# Patient Record
Sex: Female | Born: 1955 | Race: White | Hispanic: No | Marital: Single | Smoking: Never smoker
Health system: Southern US, Community
[De-identification: ages and names within clinical notes are randomized; demographics above are authoritative.]

## PROBLEM LIST (undated history)

## (undated) DIAGNOSIS — T7840XA Allergy, unspecified, initial encounter: Secondary | ICD-10-CM

## (undated) DIAGNOSIS — M545 Low back pain, unspecified: Secondary | ICD-10-CM

## (undated) DIAGNOSIS — E785 Hyperlipidemia, unspecified: Secondary | ICD-10-CM

## (undated) DIAGNOSIS — L570 Actinic keratosis: Secondary | ICD-10-CM

## (undated) DIAGNOSIS — G709 Myoneural disorder, unspecified: Secondary | ICD-10-CM

## (undated) DIAGNOSIS — R200 Anesthesia of skin: Secondary | ICD-10-CM

## (undated) DIAGNOSIS — E559 Vitamin D deficiency, unspecified: Secondary | ICD-10-CM

## (undated) DIAGNOSIS — M1990 Unspecified osteoarthritis, unspecified site: Secondary | ICD-10-CM

## (undated) DIAGNOSIS — E119 Type 2 diabetes mellitus without complications: Secondary | ICD-10-CM

## (undated) DIAGNOSIS — H269 Unspecified cataract: Secondary | ICD-10-CM

## (undated) DIAGNOSIS — I10 Essential (primary) hypertension: Secondary | ICD-10-CM

## (undated) HISTORY — DX: Allergy, unspecified, initial encounter: T78.40XA

## (undated) HISTORY — DX: Myoneural disorder, unspecified: G70.9

## (undated) HISTORY — DX: Hyperlipidemia, unspecified: E78.5

## (undated) HISTORY — DX: Vitamin D deficiency, unspecified: E55.9

## (undated) HISTORY — DX: Low back pain, unspecified: M54.50

## (undated) HISTORY — DX: Low back pain: M54.5

## (undated) HISTORY — PX: OOPHORECTOMY: SHX86

## (undated) HISTORY — DX: Essential (primary) hypertension: I10

## (undated) HISTORY — DX: Actinic keratosis: L57.0

## (undated) HISTORY — DX: Type 2 diabetes mellitus without complications: E11.9

## (undated) HISTORY — DX: Unspecified osteoarthritis, unspecified site: M19.90

## (undated) HISTORY — DX: Anesthesia of skin: R20.0

## (undated) HISTORY — DX: Unspecified cataract: H26.9

## (undated) HISTORY — PX: DILATION AND CURETTAGE OF UTERUS: SHX78

---
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
## 2004-02-26 ENCOUNTER — Ambulatory Visit: Payer: Self-pay

## 2004-11-20 ENCOUNTER — Ambulatory Visit: Payer: Self-pay

## 2005-11-26 ENCOUNTER — Ambulatory Visit: Payer: Self-pay

## 2006-05-26 ENCOUNTER — Ambulatory Visit: Payer: Self-pay

## 2006-06-03 ENCOUNTER — Ambulatory Visit: Payer: Self-pay

## 2006-11-28 ENCOUNTER — Emergency Department: Payer: Self-pay | Admitting: Emergency Medicine

## 2007-02-02 ENCOUNTER — Ambulatory Visit: Payer: Self-pay

## 2007-02-16 ENCOUNTER — Ambulatory Visit: Payer: Self-pay | Admitting: Family Medicine

## 2007-06-13 ENCOUNTER — Ambulatory Visit: Payer: Self-pay | Admitting: Family Medicine

## 2008-03-09 HISTORY — PX: ABDOMINAL HYSTERECTOMY: SHX81

## 2008-04-17 ENCOUNTER — Ambulatory Visit: Payer: Self-pay

## 2009-01-22 ENCOUNTER — Ambulatory Visit: Payer: Self-pay

## 2009-01-29 ENCOUNTER — Inpatient Hospital Stay: Payer: Self-pay

## 2009-04-23 ENCOUNTER — Ambulatory Visit: Payer: Self-pay

## 2009-05-24 ENCOUNTER — Ambulatory Visit: Payer: Self-pay

## 2010-02-17 LAB — HM DEXA SCAN: HM Dexa Scan: NORMAL

## 2010-03-09 DIAGNOSIS — E119 Type 2 diabetes mellitus without complications: Secondary | ICD-10-CM

## 2010-03-09 HISTORY — DX: Type 2 diabetes mellitus without complications: E11.9

## 2010-05-13 ENCOUNTER — Ambulatory Visit: Payer: Self-pay | Admitting: Family Medicine

## 2010-06-17 ENCOUNTER — Ambulatory Visit: Payer: Self-pay

## 2010-07-04 ENCOUNTER — Ambulatory Visit: Payer: Self-pay

## 2011-07-07 ENCOUNTER — Ambulatory Visit: Payer: Self-pay

## 2012-07-26 ENCOUNTER — Ambulatory Visit: Payer: Self-pay

## 2012-07-26 LAB — HM MAMMOGRAPHY: HM Mammogram: NORMAL

## 2012-12-07 LAB — HM PAP SMEAR: HM Pap smear: NORMAL

## 2013-04-28 ENCOUNTER — Encounter: Payer: Self-pay | Admitting: General Surgery

## 2013-05-15 ENCOUNTER — Ambulatory Visit (INDEPENDENT_AMBULATORY_CARE_PROVIDER_SITE_OTHER): Payer: BC Managed Care – PPO | Admitting: General Surgery

## 2013-05-15 ENCOUNTER — Encounter: Payer: Self-pay | Admitting: General Surgery

## 2013-05-15 ENCOUNTER — Other Ambulatory Visit: Payer: Self-pay | Admitting: General Surgery

## 2013-05-15 VITALS — BP 142/76 | HR 76 | Resp 13 | Ht 68.0 in | Wt 237.0 lb

## 2013-05-15 DIAGNOSIS — Z1211 Encounter for screening for malignant neoplasm of colon: Secondary | ICD-10-CM | POA: Insufficient documentation

## 2013-05-15 MED ORDER — POLYETHYLENE GLYCOL 3350 17 GM/SCOOP PO POWD: ORAL | Status: DC

## 2013-05-15 NOTE — Progress Notes (Signed)
Patient ID: Emily Pitts, female   DOB: [birth year], 58 y.o.   MRN: [id number]  Chief Complaint  Patient presents with  . Other    Positive hemocult    HPI Emily Pitts is a 58 y.o. female here today for colonoscopy consult. She had a positive hemoccult during her last physical. She has never had a colonoscopy. She states that she does sometimes have some nausea for the past year in the mornings that goes away with eating. This is independent of previous meals. She denies any problems with using the bathroom. She does state that since the positive hemoccult that she has noticed some redness in her stools.she states that the color is dark red, but that it is minimal.   The patient denies any pain with defecation. No history of blood mixed in with the stool.  The patient reports being identified with elevated blood sugars 3 years ago. Etter Sjogren, M.D. Is her PCP. The patient reports that her last hemoglobin A1c was improved to the point where the patient was instructed to discontinue her metformin therapy.  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 HPI  Past Medical History  Diagnosis Date  . Hypertension   . Hyperlipidemia   . Allergy   . Diabetes mellitus without complication 0932    Past Surgical History  Procedure Laterality Date  . Appendectomy  1966  . Abdominal hysterectomy  2010    No family history on file.  Social History History  Substance Use Topics  . Smoking status: Never Smoker   . Smokeless tobacco: Never Used  . Alcohol Use: No    No Known Allergies  Current Outpatient Prescriptions  Medication Sig Dispense Refill  . aspirin 81 MG tablet Take 81 mg by mouth daily.      . cholecalciferol (VITAMIN D) 400 UNITS TABS tablet Take 400 Units by mouth.      . fluticasone (FLONASE) 50 MCG/ACT nasal spray Place 1 spray into both nostrils as needed.       Marland Kitchen levocetirizine (XYZAL) 5 MG tablet Take 5 mg by mouth daily.        . magnesium oxide (MAG-OX) 400 MG tablet Take 400 mg by mouth daily.      . metFORMIN (GLUCOPHAGE) 500 MG tablet Take 500 mg by mouth daily with breakfast.       . Multiple Vitamins-Minerals (MULTIVITAMIN WITH MINERALS) tablet Take 1 tablet by mouth daily.      . simvastatin (ZOCOR) 40 MG tablet Take 40 mg by mouth daily.       Jabier Gauss 20-5-12.5 MG TABS Take 1 tablet by mouth daily.       . polyethylene glycol powder (GLYCOLAX/MIRALAX) powder 255 grams one bottle for colonoscopy prep  255 g  0   No current facility-administered medications for this visit.    Review of Systems Review of Systems  Constitutional: Negative.   Respiratory: Negative.   Cardiovascular: Negative.   Gastrointestinal: Positive for nausea and blood in stool. Negative for vomiting, abdominal pain, diarrhea, constipation, abdominal distention, anal bleeding and rectal pain.    Blood pressure 142/76, pulse 76, resp. rate 13, height 5\' 8"  (1.727 m), weight 237 lb (107.502 kg).  Physical Exam Physical Exam  Constitutional: She is oriented to person, place, and time. She appears well-developed and well-nourished.  Neck: Neck supple.  Cardiovascular: Normal rate, regular rhythm and normal heart sounds.   Pulmonary/Chest: Effort normal and breath sounds normal.  Abdominal:  Soft. Normal appearance and bowel sounds are normal. There is no hepatosplenomegaly. There is no tenderness.  Lymphadenopathy:    She has no cervical adenopathy.       Right: No inguinal adenopathy present.       Left: No inguinal adenopathy present.  Neurological: She is alert and oriented to person, place, and time.    Data Reviewed Office notes from Dr. Laurey Morale dated April 28, 2013 are notable for a positive stool Hemoccult.  Assessment    Her candidate for screening colonoscopy.     Plan    The procedure was reviewed in detail. The risks associated with colonoscopy including those of bleeding and perforation were  reviewed.  Her upper GI symptoms are not suggestive of ulcer/gastritis, an upper endoscopy would not be recommended at this time.      Patient has been scheduled for a colonoscopy on 06-07-13 at [facility]. Patient has been asked to hold metformin day of colonoscopy prep and procedure. It is okay for patient to continue 81 mg aspirin.   Robert Bellow 05/15/2013, 9:07 PM

## 2013-05-15 NOTE — Patient Instructions (Addendum)
Colonoscopy A colonoscopy is an exam to look at the entire large intestine (colon). This exam can help find problems such as tumors, polyps, inflammation, and areas of bleeding. The exam takes about 1 hour.  LET [facility] CARE PROVIDER KNOW ABOUT:   Any allergies you have.  All medicines you are taking, including vitamins, herbs, eye drops, creams, and over-the-counter medicines.  Previous problems you or members of your family have had with the use of anesthetics.  Any blood disorders you have.  Previous surgeries you have had.  Medical conditions you have. RISKS AND COMPLICATIONS  Generally, this is a safe procedure. However, as with any procedure, complications can occur. Possible complications include:  Bleeding.  Tearing or rupture of the colon wall.  Reaction to medicines given during the exam.  Infection (rare). BEFORE THE PROCEDURE   Ask your health care provider about changing or stopping your regular medicines.  You may be prescribed an oral bowel prep. This involves drinking a large amount of medicated liquid, starting the day before your procedure. The liquid will cause you to have multiple loose stools until your stool is almost clear or light green. This cleans out your colon in preparation for the procedure.  Do not eat or drink anything else once you have started the bowel prep, unless your health care provider tells you it is safe to do so.  Arrange for someone to drive you home after the procedure. PROCEDURE   You will be given medicine to help you relax (sedative).  You will lie on your side with your knees bent.  A long, flexible tube with a light and camera on the end (colonoscope) will be inserted through the rectum and into the colon. The camera sends video back to a computer screen as it moves through the colon. The colonoscope also releases carbon dioxide gas to inflate the colon. This helps your health care provider see the area better.  During  the exam, your health care provider may take a small tissue sample (biopsy) to be examined under a microscope if any abnormalities are found.  The exam is finished when the entire colon has been viewed. AFTER THE PROCEDURE   Do not drive for 24 hours after the exam.  You may have a small amount of blood in your stool.  You may pass moderate amounts of gas and have mild abdominal cramping or bloating. This is caused by the gas used to inflate your colon during the exam.  Ask when your test results will be ready and how you will get your results. Make sure you get your test results. Document Released: 02/21/2000 Document Revised: 12/14/2012 Document Reviewed: 10/31/2012 [facility] Patient Information 2014 [place].  Patient has been scheduled for a colonoscopy on 06-07-13 at [facility]. Patient has been asked to hold metformin day of colonoscopy prep and procedure. It is okay for patient to continue 81 mg aspirin.

## 2013-06-07 ENCOUNTER — Ambulatory Visit: Payer: Self-pay | Admitting: General Surgery

## 2013-06-07 DIAGNOSIS — Z1211 Encounter for screening for malignant neoplasm of colon: Secondary | ICD-10-CM

## 2013-06-07 HISTORY — PX: COLONOSCOPY: SHX174

## 2013-06-07 LAB — HM COLONOSCOPY: HM Colonoscopy: NORMAL

## 2013-06-08 LAB — PATHOLOGY REPORT

## 2013-06-12 ENCOUNTER — Telehealth: Payer: Self-pay | Admitting: *Deleted

## 2013-06-12 ENCOUNTER — Encounter: Payer: Self-pay | Admitting: General Surgery

## 2013-06-12 NOTE — Telephone Encounter (Signed)
Patient notified as instructed and she verbalizes understanding. This patient will be placed in the recalls for 10 year follow up.

## 2013-06-12 NOTE — Telephone Encounter (Signed)
Message copied by Dominga Ferry on Mon Jun 12, 2013  2:14 PM ------      Message from: [place], [place] W      Created: Mon Jun 12, 2013  9:05 AM       Reason notify the patient at the biopsy completed at the time of her colon exam was normal. She should plan on repeat exam in 10 years, earlier if any symptoms develop. ------

## 2013-07-06 ENCOUNTER — Encounter: Payer: Self-pay | Admitting: General Surgery

## 2013-08-09 ENCOUNTER — Ambulatory Visit: Payer: Self-pay | Admitting: Family Medicine

## 2014-01-08 ENCOUNTER — Encounter: Payer: Self-pay | Admitting: General Surgery

## 2014-04-20 LAB — LIPID PANEL
Cholesterol: 145 mg/dL (ref 0–200)
HDL: 56 mg/dL (ref 35–70)
LDL Cholesterol: 64 mg/dL
TRIGLYCERIDES: 124 mg/dL (ref 40–160)

## 2014-04-20 LAB — HEMOGLOBIN A1C: Hgb A1c MFr Bld: 6.5 % — AB (ref 4.0–6.0)

## 2014-09-20 LAB — HM MAMMOGRAPHY: HM Mammogram: NORMAL

## 2014-10-08 ENCOUNTER — Encounter: Payer: Self-pay | Admitting: Family Medicine

## 2014-10-08 ENCOUNTER — Other Ambulatory Visit: Payer: Self-pay | Admitting: Family Medicine

## 2014-10-08 ENCOUNTER — Ambulatory Visit (INDEPENDENT_AMBULATORY_CARE_PROVIDER_SITE_OTHER): Payer: BC Managed Care – PPO | Admitting: Family Medicine

## 2014-10-08 VITALS — BP 118/60 | HR 107 | Temp 99.2°F | Resp 18 | Wt 234.8 lb

## 2014-10-08 DIAGNOSIS — E1129 Type 2 diabetes mellitus with other diabetic kidney complication: Secondary | ICD-10-CM | POA: Insufficient documentation

## 2014-10-08 DIAGNOSIS — R94131 Abnormal electromyogram [EMG]: Secondary | ICD-10-CM | POA: Insufficient documentation

## 2014-10-08 DIAGNOSIS — I10 Essential (primary) hypertension: Secondary | ICD-10-CM | POA: Insufficient documentation

## 2014-10-08 DIAGNOSIS — R9431 Abnormal electrocardiogram [ECG] [EKG]: Secondary | ICD-10-CM | POA: Insufficient documentation

## 2014-10-08 DIAGNOSIS — K219 Gastro-esophageal reflux disease without esophagitis: Secondary | ICD-10-CM | POA: Insufficient documentation

## 2014-10-08 DIAGNOSIS — M545 Low back pain, unspecified: Secondary | ICD-10-CM | POA: Insufficient documentation

## 2014-10-08 DIAGNOSIS — E785 Hyperlipidemia, unspecified: Secondary | ICD-10-CM | POA: Insufficient documentation

## 2014-10-08 DIAGNOSIS — R809 Proteinuria, unspecified: Secondary | ICD-10-CM | POA: Insufficient documentation

## 2014-10-08 DIAGNOSIS — N390 Urinary tract infection, site not specified: Secondary | ICD-10-CM

## 2014-10-08 DIAGNOSIS — E8940 Asymptomatic postprocedural ovarian failure: Secondary | ICD-10-CM | POA: Insufficient documentation

## 2014-10-08 DIAGNOSIS — R209 Unspecified disturbances of skin sensation: Secondary | ICD-10-CM | POA: Insufficient documentation

## 2014-10-08 DIAGNOSIS — E669 Obesity, unspecified: Secondary | ICD-10-CM | POA: Insufficient documentation

## 2014-10-08 DIAGNOSIS — J3089 Other allergic rhinitis: Secondary | ICD-10-CM

## 2014-10-08 DIAGNOSIS — E559 Vitamin D deficiency, unspecified: Secondary | ICD-10-CM | POA: Insufficient documentation

## 2014-10-08 DIAGNOSIS — M722 Plantar fascial fibromatosis: Secondary | ICD-10-CM | POA: Insufficient documentation

## 2014-10-08 DIAGNOSIS — J302 Other seasonal allergic rhinitis: Secondary | ICD-10-CM | POA: Insufficient documentation

## 2014-10-08 DIAGNOSIS — N951 Menopausal and female climacteric states: Secondary | ICD-10-CM | POA: Insufficient documentation

## 2014-10-08 DIAGNOSIS — E1140 Type 2 diabetes mellitus with diabetic neuropathy, unspecified: Secondary | ICD-10-CM | POA: Insufficient documentation

## 2014-10-08 DIAGNOSIS — F439 Reaction to severe stress, unspecified: Secondary | ICD-10-CM | POA: Insufficient documentation

## 2014-10-08 LAB — POCT URINALYSIS DIPSTICK
BILIRUBIN UA: NEGATIVE
Glucose, UA: NEGATIVE
Ketones, UA: NEGATIVE
NITRITE UA: NEGATIVE
RBC UA: NEGATIVE
UROBILINOGEN UA: 1
pH, UA: 6

## 2014-10-08 MED ORDER — CIPROFLOXACIN HCL 500 MG PO TABS: 500.0000 mg | ORAL_TABLET | Freq: Two times a day (BID) | ORAL | Status: DC

## 2014-10-08 NOTE — Patient Instructions (Signed)

## 2014-10-08 NOTE — Addendum Note (Signed)
Addended by: Bobetta Lime on: 10/08/2014 03:13 PM   Modules accepted: Miquel Dunn

## 2014-10-08 NOTE — Progress Notes (Addendum)
Name: Emily Pitts   MRN: [id number]    DOB: [birth year]   Date:10/08/2014       Progress Note  Subjective  Chief Complaint  Chief Complaint  Patient presents with  . Urinary Tract Infection    patient presents with UTI symptoms since Friday.    HPI  Patient is here today with concerns regarding the following symptoms burning with urination, hematuria, hesitancy, incomplete bladder emptying and nausea that started 4 days ago.  Associated with fevers, chills, sweats, fatigue and malaise. Some low back pain but no overt flank pain. Patient states that she has tried OTC Advil, but has not gotten any relief.   Past Medical History  Diagnosis Date  . Hypertension   . Hyperlipidemia   . Allergy   . Diabetes mellitus without complication 2111    History  Substance Use Topics  . Smoking status: Never Smoker   . Smokeless tobacco: Never Used  . Alcohol Use: No     Current outpatient prescriptions:  .  aspirin 81 MG tablet, Take 81 mg by mouth daily., Disp: , Rfl:  .  atorvastatin (LIPITOR) 40 MG tablet, Take 1 tablet by mouth at bedtime., Disp: , Rfl:  .  cholecalciferol (VITAMIN D) 400 UNITS TABS tablet, Take 400 Units by mouth., Disp: , Rfl:  .  ciprofloxacin (CIPRO) 500 MG tablet, Take 1 tablet (500 mg total) by mouth 2 (two) times daily., Disp: 20 tablet, Rfl: 0 .  fluticasone (FLONASE) 50 MCG/ACT nasal spray, Place 1 spray into both nostrils as needed. , Disp: , Rfl:  .  glucose blood test strip, , Disp: , Rfl:  .  levocetirizine (XYZAL) 5 MG tablet, Take 5 mg by mouth daily. , Disp: , Rfl:  .  magnesium oxide (MAG-OX) 400 MG tablet, Take 400 mg by mouth daily., Disp: , Rfl:  .  Multiple Vitamins-Minerals (MULTIVITAMIN WITH MINERALS) tablet, Take 1 tablet by mouth daily., Disp: , Rfl:  .  polyethylene glycol powder (GLYCOLAX/MIRALAX) powder, 255 grams one bottle for colonoscopy prep, Disp: 255 g, Rfl: 0 .  TRIBENZOR 20-5-12.5 MG TABS, Take 1 tablet by mouth daily. , Disp: , Rfl:    No Known Allergies  ROS  10 Systems reviewed and is negative except as mentioned in HPI.   Objective  Filed Vitals:   10/08/14 1449  BP: 118/60  Pulse: 107  Temp: 99.2 F (37.3 C)  TempSrc: Oral  Resp: 18  Weight: 234 lb 12.8 oz (106.505 kg)  SpO2: 98%   Body mass index is 35.71 kg/(m^2).   Recent Results (from the past 2160 hour(s))  POCT urinalysis dipstick     Status: Abnormal   Collection Time: 10/08/14  2:56 PM  Result Value Ref Range   Color, UA ORANGISH RED    Clarity, UA DARK    Glucose, UA NEGATIVE    Bilirubin, UA NEGATIVE    Ketones, UA NEGATIVE    Spec Grav, UA >=1.030    Blood, UA NEGATIVE    pH, UA 6.0    Protein, UA TRACE    Urobilinogen, UA 1.0    Nitrite, UA NEGATIVE    Leukocytes, UA small (1+) (A) Negative    Physical Exam  Constitutional: Patient is obese and well-nourished. In no acute distress but does appear to be uncomfortable from acute illness. Warm to the touch.  Cardiovascular: Normal rate, regular rhythm and normal heart sounds.  No murmur heard.  Pulmonary/Chest: Effort normal and breath sounds normal. No respiratory distress.  Abdomen: Soft with normal bowel sounds, mild tenderness on deep palpation over suprapubic area, no reproducible flank tenderness bilaterally.  Genitourinary: Exam deferred. Skin: Skin is warm and dry. No rash noted. No erythema.  Psychiatric: Patient has a normal mood and affect. Behavior is normal in office today. Judgment and thought content normal in office today.   Assessment & Plan  1. Urinary tract infection without hematuria, site unspecified Symptoms suggestive of complicated urinary tract infection due to complex medical diagnoses such as Diabetes II. Instructed patient on increasing hydration with water and ways to prevent future UTIs. May use Azo for symptomatic relief if not already doing so but not recommended to be used beyond 2-3 days. May start antibiotic therapy.   The patient has been  counseled on the proper use, side effects and potential interactions of the new medication. Patient encouraged to review the side effects and safety profile pamphlet provided with the prescription from the pharmacy as well as request counseling from the pharmacy team as needed.   - POCT urinalysis dipstick - Urine culture - ciprofloxacin (CIPRO) 500 MG tablet; Take 1 tablet (500 mg total) by mouth 2 (two) times daily.  Dispense: 20 tablet; Refill: 0

## 2014-10-09 ENCOUNTER — Telehealth: Payer: Self-pay | Admitting: Family Medicine

## 2014-10-09 NOTE — Telephone Encounter (Signed)
Pt was seen on 10/08/14 and given an antibiotic but is wanting to know if there is something else she can take or do for relief

## 2014-10-10 ENCOUNTER — Inpatient Hospital Stay
Admission: EM | Admit: 2014-10-10 | Discharge: 2014-10-12 | DRG: 872 | Disposition: A | Discharge status: Home / Self Care | Payer: BC Managed Care – PPO | Attending: Internal Medicine | Admitting: Internal Medicine

## 2014-10-10 ENCOUNTER — Inpatient Hospital Stay: Payer: BC Managed Care – PPO

## 2014-10-10 ENCOUNTER — Emergency Department: Payer: BC Managed Care – PPO

## 2014-10-10 ENCOUNTER — Encounter: Payer: Self-pay | Admitting: Emergency Medicine

## 2014-10-10 DIAGNOSIS — E876 Hypokalemia: Secondary | ICD-10-CM | POA: Diagnosis present

## 2014-10-10 DIAGNOSIS — A419 Sepsis, unspecified organism: Secondary | ICD-10-CM | POA: Diagnosis present

## 2014-10-10 DIAGNOSIS — Z79899 Other long term (current) drug therapy: Secondary | ICD-10-CM

## 2014-10-10 DIAGNOSIS — Z1211 Encounter for screening for malignant neoplasm of colon: Secondary | ICD-10-CM

## 2014-10-10 DIAGNOSIS — R17 Unspecified jaundice: Secondary | ICD-10-CM | POA: Diagnosis present

## 2014-10-10 DIAGNOSIS — N12 Tubulo-interstitial nephritis, not specified as acute or chronic: Secondary | ICD-10-CM | POA: Diagnosis present

## 2014-10-10 DIAGNOSIS — Z7982 Long term (current) use of aspirin: Secondary | ICD-10-CM | POA: Diagnosis not present

## 2014-10-10 DIAGNOSIS — Z90710 Acquired absence of both cervix and uterus: Secondary | ICD-10-CM

## 2014-10-10 DIAGNOSIS — I10 Essential (primary) hypertension: Secondary | ICD-10-CM | POA: Diagnosis present

## 2014-10-10 DIAGNOSIS — Z09 Encounter for follow-up examination after completed treatment for conditions other than malignant neoplasm: Secondary | ICD-10-CM

## 2014-10-10 DIAGNOSIS — N179 Acute kidney failure, unspecified: Secondary | ICD-10-CM

## 2014-10-10 DIAGNOSIS — E119 Type 2 diabetes mellitus without complications: Secondary | ICD-10-CM | POA: Diagnosis present

## 2014-10-10 DIAGNOSIS — Z8249 Family history of ischemic heart disease and other diseases of the circulatory system: Secondary | ICD-10-CM | POA: Diagnosis not present

## 2014-10-10 DIAGNOSIS — Z6835 Body mass index (BMI) 35.0-35.9, adult: Secondary | ICD-10-CM | POA: Diagnosis not present

## 2014-10-10 DIAGNOSIS — E785 Hyperlipidemia, unspecified: Secondary | ICD-10-CM | POA: Diagnosis present

## 2014-10-10 DIAGNOSIS — E669 Obesity, unspecified: Secondary | ICD-10-CM | POA: Diagnosis present

## 2014-10-10 DIAGNOSIS — Z8619 Personal history of other infectious and parasitic diseases: Secondary | ICD-10-CM | POA: Diagnosis present

## 2014-10-10 DIAGNOSIS — Z9049 Acquired absence of other specified parts of digestive tract: Secondary | ICD-10-CM | POA: Diagnosis present

## 2014-10-10 DIAGNOSIS — Z833 Family history of diabetes mellitus: Secondary | ICD-10-CM

## 2014-10-10 DIAGNOSIS — R109 Unspecified abdominal pain: Secondary | ICD-10-CM

## 2014-10-10 DIAGNOSIS — N289 Disorder of kidney and ureter, unspecified: Secondary | ICD-10-CM

## 2014-10-10 LAB — COMPREHENSIVE METABOLIC PANEL
ALT: 95 U/L — ABNORMAL HIGH (ref 14–54)
ANION GAP: 15 (ref 5–15)
AST: 97 U/L — ABNORMAL HIGH (ref 15–41)
Albumin: 2.9 g/dL — ABNORMAL LOW (ref 3.5–5.0)
Alkaline Phosphatase: 134 U/L — ABNORMAL HIGH (ref 38–126)
BILIRUBIN TOTAL: 2.7 mg/dL — AB (ref 0.3–1.2)
BUN: 36 mg/dL — AB (ref 6–20)
CALCIUM: 8.3 mg/dL — AB (ref 8.9–10.3)
CHLORIDE: 97 mmol/L — AB (ref 101–111)
CO2: 22 mmol/L (ref 22–32)
Creatinine, Ser: 2.35 mg/dL — ABNORMAL HIGH (ref 0.44–1.00)
GFR calc non Af Amer: 21 mL/min — ABNORMAL LOW (ref >60)
GFR, EST AFRICAN AMERICAN: 25 mL/min — AB (ref >60)
Glucose, Bld: 180 mg/dL — ABNORMAL HIGH (ref 65–99)
Potassium: 3.3 mmol/L — ABNORMAL LOW (ref 3.5–5.1)
Sodium: 134 mmol/L — ABNORMAL LOW (ref 135–145)
Total Protein: 7.1 g/dL (ref 6.5–8.1)

## 2014-10-10 LAB — CBC
HEMATOCRIT: 32.2 % — AB (ref 35.0–47.0)
Hemoglobin: 10.8 g/dL — ABNORMAL LOW (ref 12.0–16.0)
MCH: 28.2 pg (ref 26.0–34.0)
MCHC: 33.5 g/dL (ref 32.0–36.0)
MCV: 84.1 fL (ref 80.0–100.0)
PLATELETS: 181 10*3/uL (ref 150–440)
RBC: 3.83 MIL/uL (ref 3.80–5.20)
RDW: 14.4 % (ref 11.5–14.5)
WBC: 13.5 10*3/uL — AB (ref 3.6–11.0)

## 2014-10-10 LAB — URINALYSIS COMPLETE WITH MICROSCOPIC (ARMC ONLY)
BILIRUBIN URINE: NEGATIVE
GLUCOSE, UA: NEGATIVE mg/dL
Ketones, ur: NEGATIVE mg/dL
Leukocytes, UA: NEGATIVE
Nitrite: NEGATIVE
Protein, ur: 30 mg/dL — AB
Specific Gravity, Urine: 1.013 (ref 1.005–1.030)
pH: 5 (ref 5.0–8.0)

## 2014-10-10 LAB — URINE CULTURE

## 2014-10-10 LAB — LACTIC ACID, PLASMA
Lactic Acid, Venous: 1.6 mmol/L (ref 0.5–2.0)
Lactic Acid, Venous: 1.2 mmol/L (ref 0.5–2.0)

## 2014-10-10 LAB — HEMOGLOBIN A1C: Hgb A1c MFr Bld: 7.4 % — ABNORMAL HIGH (ref 4.0–6.0)

## 2014-10-10 LAB — TSH: TSH: 1.289 u[IU]/mL (ref 0.350–4.500)

## 2014-10-10 MED ORDER — MORPHINE SULFATE 2 MG/ML IJ SOLN: 2.0000 mg | INTRAMUSCULAR | Status: DC | PRN

## 2014-10-10 MED ORDER — HEPARIN SODIUM (PORCINE) 5000 UNIT/ML IJ SOLN
5000.0000 [IU] | Freq: Three times a day (TID) | INTRAMUSCULAR | Status: DC
  Administered 2014-10-10 – 2014-10-12 (×7): 5000 [IU] via SUBCUTANEOUS
  Filled 2014-10-10 (×7): qty 1

## 2014-10-10 MED ORDER — DEXTROSE 5 % IV SOLN
1.0000 g | INTRAVENOUS | Status: DC
  Administered 2014-10-11 – 2014-10-12 (×2): 1 g via INTRAVENOUS
  Filled 2014-10-10 (×3): qty 10

## 2014-10-10 MED ORDER — ASPIRIN 81 MG PO TABS: 81.0000 mg | ORAL_TABLET | Freq: Every day | ORAL | Status: DC

## 2014-10-10 MED ORDER — IRBESARTAN 150 MG PO TABS
300.0000 mg | ORAL_TABLET | Freq: Every day | ORAL | Status: DC
  Administered 2014-10-10 – 2014-10-12 (×3): 300 mg via ORAL
  Filled 2014-10-10 (×3): qty 2

## 2014-10-10 MED ORDER — POTASSIUM CHLORIDE 20 MEQ PO PACK
40.0000 meq | PACK | Freq: Once | ORAL | Status: AC
  Administered 2014-10-10: 40 meq via ORAL
  Filled 2014-10-10: qty 2

## 2014-10-10 MED ORDER — DOCUSATE SODIUM 100 MG PO CAPS
100.0000 mg | ORAL_CAPSULE | Freq: Two times a day (BID) | ORAL | Status: DC
  Administered 2014-10-10 – 2014-10-12 (×3): 100 mg via ORAL
  Filled 2014-10-10 (×3): qty 1

## 2014-10-10 MED ORDER — SODIUM CHLORIDE 0.9 % IV SOLN
INTRAVENOUS | Status: DC
  Administered 2014-10-10 – 2014-10-11 (×4): via INTRAVENOUS

## 2014-10-10 MED ORDER — ACETAMINOPHEN 325 MG PO TABS
650.0000 mg | ORAL_TABLET | Freq: Four times a day (QID) | ORAL | Status: DC | PRN
  Administered 2014-10-10 – 2014-10-12 (×6): 650 mg via ORAL
  Filled 2014-10-10 (×6): qty 2

## 2014-10-10 MED ORDER — OLMESARTAN-AMLODIPINE-HCTZ 40-5-25 MG PO TABS: 1.0000 | ORAL_TABLET | Freq: Every day | ORAL | Status: DC

## 2014-10-10 MED ORDER — ASPIRIN 81 MG PO CHEW
81.0000 mg | CHEWABLE_TABLET | Freq: Every day | ORAL | Status: DC
  Administered 2014-10-10 – 2014-10-12 (×3): 81 mg via ORAL
  Filled 2014-10-10 (×3): qty 1

## 2014-10-10 MED ORDER — ONDANSETRON HCL 4 MG/2ML IJ SOLN
4.0000 mg | Freq: Four times a day (QID) | INTRAMUSCULAR | Status: DC | PRN
  Administered 2014-10-10: 4 mg via INTRAVENOUS
  Filled 2014-10-10: qty 2

## 2014-10-10 MED ORDER — HYDROCHLOROTHIAZIDE 25 MG PO TABS
25.0000 mg | ORAL_TABLET | Freq: Every day | ORAL | Status: DC
  Administered 2014-10-10 – 2014-10-12 (×3): 25 mg via ORAL
  Filled 2014-10-10 (×3): qty 1

## 2014-10-10 MED ORDER — AMLODIPINE BESYLATE 5 MG PO TABS
5.0000 mg | ORAL_TABLET | Freq: Every day | ORAL | Status: DC
  Administered 2014-10-10 – 2014-10-12 (×3): 5 mg via ORAL
  Filled 2014-10-10 (×3): qty 1

## 2014-10-10 MED ORDER — DEXTROSE 5 % IV SOLN
1.0000 g | Freq: Once | INTRAVENOUS | Status: AC
  Administered 2014-10-10: 1 g via INTRAVENOUS
  Filled 2014-10-10: qty 10

## 2014-10-10 MED ORDER — ONDANSETRON HCL 4 MG PO TABS: 4.0000 mg | ORAL_TABLET | Freq: Four times a day (QID) | ORAL | Status: DC | PRN

## 2014-10-10 MED ORDER — MULTI-VITAMIN/MINERALS PO TABS: 1.0000 | ORAL_TABLET | Freq: Every day | ORAL | Status: DC

## 2014-10-10 MED ORDER — VITAMIN C 500 MG PO TABS
500.0000 mg | ORAL_TABLET | Freq: Every day | ORAL | Status: DC
  Administered 2014-10-10 – 2014-10-12 (×2): 500 mg via ORAL
  Filled 2014-10-10 (×2): qty 1

## 2014-10-10 MED ORDER — ADULT MULTIVITAMIN W/MINERALS CH
1.0000 | ORAL_TABLET | Freq: Every day | ORAL | Status: DC
  Administered 2014-10-10 – 2014-10-12 (×2): 1 via ORAL
  Filled 2014-10-10 (×2): qty 1

## 2014-10-10 MED ORDER — LORATADINE 10 MG PO TABS
10.0000 mg | ORAL_TABLET | Freq: Every day | ORAL | Status: DC
  Administered 2014-10-10 – 2014-10-12 (×3): 10 mg via ORAL
  Filled 2014-10-10 (×3): qty 1

## 2014-10-10 MED ORDER — LEVOCETIRIZINE DIHYDROCHLORIDE 5 MG PO TABS: 5.0000 mg | ORAL_TABLET | Freq: Every day | ORAL | Status: DC

## 2014-10-10 MED ORDER — SODIUM CHLORIDE 0.9 % IV BOLUS (SEPSIS)
1000.0000 mL | Freq: Once | INTRAVENOUS | Status: AC
  Administered 2014-10-10: 1000 mL via INTRAVENOUS

## 2014-10-10 MED ORDER — VITAMIN B-12 1000 MCG PO TABS
1000.0000 ug | ORAL_TABLET | Freq: Every day | ORAL | Status: DC
  Administered 2014-10-10 – 2014-10-12 (×2): 1000 ug via ORAL
  Filled 2014-10-10 (×2): qty 1

## 2014-10-10 MED ORDER — ACETAMINOPHEN 650 MG RE SUPP: 650.0000 mg | Freq: Four times a day (QID) | RECTAL | Status: DC | PRN

## 2014-10-10 NOTE — ED Notes (Signed)
Pt uprite on stretcher in exam room with no distress noted; st no urge to void at this time; MD aware

## 2014-10-10 NOTE — Progress Notes (Signed)
[facility] [place] NAME: Marcelline Temkin    MR#:  [id number]  DATE OF BIRTH:  [birth year]  SUBJECTIVE:  CHIEF COMPLAINT:  Patient is feeling weak and tired. Nauseous but no vomiting.  REVIEW OF SYSTEMS:  CONSTITUTIONAL: No fever, reporting fatigue and weakness.  EYES: No blurred or double vision.  EARS, NOSE, AND THROAT: No tinnitus or ear pain.  RESPIRATORY: No cough, shortness of breath, wheezing or hemoptysis.  CARDIOVASCULAR: No chest pain, orthopnea, edema.  GASTROINTESTINAL: No nausea, vomiting, diarrhea or abdominal pain.  GENITOURINARY: No dysuria, hematuria.  ENDOCRINE: No polyuria, nocturia,  HEMATOLOGY: No anemia, easy bruising or bleeding SKIN: No rash or lesion. MUSCULOSKELETAL: No joint pain or arthritis. Reporting back pain   NEUROLOGIC: No tingling, numbness, weakness.  PSYCHIATRY: No anxiety or depression.   DRUG ALLERGIES:  No Known Allergies  VITALS:  Blood pressure 114/63, pulse 82, temperature 98.8 F (37.1 C), temperature source Oral, resp. rate 17, height 5\' 9"  (1.753 m), weight 107.457 kg (236 lb 14.4 oz), SpO2 98 %.  PHYSICAL EXAMINATION:  GENERAL:  59 y.o.-year-old patient lying in the bed with no acute distress.  EYES: Pupils equal, round, reactive to light and accommodation. No scleral icterus. Extraocular muscles intact.  HEENT: Head atraumatic, normocephalic. Oropharynx and nasopharynx clear.  NECK:  Supple, no jugular venous distention. No thyroid enlargement, no tenderness.  LUNGS: Normal breath sounds bilaterally, no wheezing, rales,rhonchi or crepitation. No use of accessory muscles of respiration.  CARDIOVASCULAR: S1, S2 normal. No murmurs, rubs, or gallops.  ABDOMEN: Soft, nontender, nondistended. Bowel sounds present. No organomegaly or mass. No CVA tenderness EXTREMITIES: No pedal edema, cyanosis, or clubbing.  NEUROLOGIC: Cranial nerves II through XII are intact. Muscle strength 5/5 in all  extremities. Sensation intact. Gait not checked.  PSYCHIATRIC: The patient is alert and oriented x 3.  SKIN: No obvious rash, lesion, or ulcer.    LABORATORY PANEL:   CBC  Recent Labs Lab 10/10/14 0351  WBC 13.5*  HGB 10.8*  HCT 32.2*  PLT 181   ------------------------------------------------------------------------------------------------------------------  Chemistries   Recent Labs Lab 10/10/14 0351  NA 134*  K 3.3*  CL 97*  CO2 22  GLUCOSE 180*  BUN 36*  CREATININE 2.35*  CALCIUM 8.3*  AST 97*  ALT 95*  ALKPHOS 134*  BILITOT 2.7*   ------------------------------------------------------------------------------------------------------------------  Cardiac Enzymes No results for input(s): TROPONINI in the last 168 hours. ------------------------------------------------------------------------------------------------------------------  RADIOLOGY:  US Renal  10/10/2014   CLINICAL DATA:  Acute kidney injury  EXAM: RENAL / URINARY TRACT ULTRASOUND COMPLETE  COMPARISON:  CT abdomen pelvis 10/10/2014  FINDINGS: Right Kidney:  Length: 11.7 cm. Echogenicity within normal limits. No mass or hydronephrosis visualized.  Left Kidney:  Length: 12.1 cm. Echogenicity within normal limits. No mass or hydronephrosis visualized.  Bladder:  Urinate bladder not visualized and empty. The patient voided prior to the study.  IMPRESSION: Negative   Electronically Signed   By: Franchot Gallo M.D.   On: 10/10/2014 10:54   Ct Renal Stone Study  10/10/2014   CLINICAL DATA:  Fever and low back pain.  EXAM: CT ABDOMEN AND PELVIS WITHOUT CONTRAST  TECHNIQUE: Multidetector CT imaging of the abdomen and pelvis was performed following the standard protocol without IV contrast.  COMPARISON:  None.  FINDINGS: There is no urinary calculus. There is no hydronephrosis or ureteral dilatation. There are unremarkable unenhanced appearances of the liver, spleen, pancreas, adrenals and kidneys. The abdominal  aorta is normal  in caliber. There is no atherosclerotic calcification. There is no adenopathy in the abdomen or pelvis. There is a small hiatal hernia. The small bowel is unremarkable. There is moderate colonic diverticulosis without evidence of diverticulitis or other acute inflammatory process. There is hysterectomy. No adnexal abnormality is evident.  There is no significant abnormality in the lower chest. There is no significant musculoskeletal lesion. There is moderate degenerative disc disease at L5-S1.  IMPRESSION: 1. Colonic diverticulosis. 2. Small hiatal hernia 3. No acute findings are evident in the abdomen or pelvis.   Electronically Signed   By: Andreas Newport M.D.   On: 10/10/2014 05:08    EKG:  No orders found for this or any previous visit.  ASSESSMENT AND PLAN:    1. Sepsis: The patient meets criteria via leukocytosis and fever.  Follow-up Blood cultures and urine cultures Continue on Rocephin.   2. Pyelonephritis:  Continue to treat the patient with intravenous fluid. Continue IV antibiotics and follow-up on the urine culture and sensitivity   3. Acute kidney injury: Secondary to prerenal and renal  Avoid nephrotoxic agents Continue hydration with IV fluids Renal ultrasound is normal Foley catheter for monitoring intake and output If no improvement will consider nephrology consult . 4. Obesity: BMI is 35.1; encourage healthy diet and exercise 5. DVT provider is: Heparin 6. GI prophylaxis: None    All the records are reviewed and case discussed with Care Management/Social Workerr. Management plans discussed with the patient, family and they are in agreement.  CODE STATUS: Full code  TOTAL TIME TAKING CARE OF THIS PATIENT: Reviewing medical records, labs, follow-up visit, new orders and coordination of care-35 minutes.   POSSIBLE D/C IN 2 DAYS, DEPENDING ON CLINICAL CONDITION.   Nicholes Mango M.D on 10/10/2014 at 2:03 PM  Between 7am to 6pm - Pager -  [phone] After 6pm go to [email or web address] - password [email or web address] [facility]  Office  [phone]  CC: Primary care physician; Loistine Chance, MD

## 2014-10-10 NOTE — ED Notes (Signed)
Pt to room 19 via EMS from home; reports began having fever on Friday; Saturday left lower back pain; seen PCP Monday and dx with UTI; rx Cipro--took 1st ds Monday pm; now with persistent pain, N/V, fever

## 2014-10-10 NOTE — ED Notes (Signed)
Dr. Diamond in to see pt.  

## 2014-10-10 NOTE — ED Notes (Signed)
Pt up to room commode for urine specimen but unable to void at this time; MD notified and st to wait for antibiotic infusion until urine obtained

## 2014-10-10 NOTE — H&P (Signed)
Emily Pitts is an 59 y.o. female.   Chief Complaint: Back pain HPI: The patient presents emergency department complaining of malaise 4 days. She states that she's also developed back pain the last 2 days. She metastases to intermittent fevers. MAXIMUM TEMPERATURE at home 102.4. She admits to nausea but no vomiting. She is also had chills and shortness of breath particularly when riders are worse. After evaluation in the emergency department revealed urinary tract infection with significant leukocytosis. The patient denies any urinary symptoms. CT of abdomen shows inflammation of the kidneys indicative of pyelonephritis which prompted the emergency department staff to call for admission.  Past Medical History  Diagnosis Date  . Hypertension   . Hyperlipidemia   . Allergy   . Diabetes mellitus without complication 5366    Past Surgical History  Procedure Laterality Date  . Appendectomy  1966  . Abdominal hysterectomy  2010    Family History  Problem Relation Age of Onset  . Diabetes Mother   . Diabetes Father   . Hypertension Father   . Diabetes Brother    Social History:  reports that she has never smoked. She has never used smokeless tobacco. She reports that she does not drink alcohol or use illicit drugs.  Allergies: No Known Allergies  Medications Prior to Admission  Medication Sig Dispense Refill  . aspirin 81 MG tablet Take 81 mg by mouth daily.    . ciprofloxacin (CIPRO) 500 MG tablet Take 1 tablet (500 mg total) by mouth 2 (two) times daily. 20 tablet 0  . magnesium oxide (MAG-OX) 400 MG tablet Take 400 mg by mouth daily.    . Multiple Vitamins-Minerals (MULTIVITAMIN WITH MINERALS) tablet Take 1 tablet by mouth daily.    . Olmesartan-Amlodipine-HCTZ (TRIBENZOR) 40-5-25 MG TABS Take 1 tablet by mouth daily.    . vitamin B-12 (CYANOCOBALAMIN) 1000 MCG tablet Take 1,000 mcg by mouth daily.    . vitamin C (ASCORBIC ACID) 500 MG tablet Take 500 mg by mouth daily.    Marland Kitchen  levocetirizine (XYZAL) 5 MG tablet Take 5 mg by mouth daily.     . polyethylene glycol powder (GLYCOLAX/MIRALAX) powder 255 grams one bottle for colonoscopy prep 255 g 0    Results for orders placed or performed during the hospital encounter of 10/10/14 (from the past 48 hour(s))  CBC     Status: Abnormal   Collection Time: 10/10/14  3:51 AM  Result Value Ref Range   WBC 13.5 (H) 3.6 - 11.0 K/uL   RBC 3.83 3.80 - 5.20 MIL/uL   Hemoglobin 10.8 (L) 12.0 - 16.0 g/dL   HCT 32.2 (L) 35.0 - 47.0 %   MCV 84.1 80.0 - 100.0 fL   MCH 28.2 26.0 - 34.0 pg   MCHC 33.5 32.0 - 36.0 g/dL   RDW 14.4 11.5 - 14.5 %   Platelets 181 150 - 440 K/uL  Comprehensive metabolic panel     Status: Abnormal   Collection Time: 10/10/14  3:51 AM  Result Value Ref Range   Sodium 134 (L) 135 - 145 mmol/L   Potassium 3.3 (L) 3.5 - 5.1 mmol/L   Chloride 97 (L) 101 - 111 mmol/L   CO2 22 22 - 32 mmol/L   Glucose, Bld 180 (H) 65 - 99 mg/dL   BUN 36 (H) 6 - 20 mg/dL   Creatinine, Ser 2.35 (H) 0.44 - 1.00 mg/dL   Calcium 8.3 (L) 8.9 - 10.3 mg/dL   Total Protein 7.1 6.5 - 8.1  g/dL   Albumin 2.9 (L) 3.5 - 5.0 g/dL   AST 97 (H) 15 - 41 U/L   ALT 95 (H) 14 - 54 U/L   Alkaline Phosphatase 134 (H) 38 - 126 U/L   Total Bilirubin 2.7 (H) 0.3 - 1.2 mg/dL   GFR calc non Af Amer 21 (L) >60 mL/min   GFR calc Af Amer 25 (L) >60 mL/min    Comment: (NOTE) The eGFR has been calculated using the CKD EPI equation. This calculation has not been validated in all clinical situations. eGFR's persistently <60 mL/min signify possible Chronic Kidney Disease.    Anion gap 15 5 - 15  Lactic acid, plasma     Status: None   Collection Time: 10/10/14  4:09 AM  Result Value Ref Range   Lactic Acid, Venous 1.6 0.5 - 2.0 mmol/L  Urinalysis complete, with microscopic ([facility] only)     Status: Abnormal   Collection Time: 10/10/14  7:16 AM  Result Value Ref Range   Color, Urine AMBER (A) YELLOW   APPearance CLOUDY (A) CLEAR   Glucose, UA  NEGATIVE NEGATIVE mg/dL   Bilirubin Urine NEGATIVE NEGATIVE   Ketones, ur NEGATIVE NEGATIVE mg/dL   Specific Gravity, Urine 1.013 1.005 - 1.030   Hgb urine dipstick 1+ (A) NEGATIVE   pH 5.0 5.0 - 8.0   Protein, ur 30 (A) NEGATIVE mg/dL   Nitrite NEGATIVE NEGATIVE   Leukocytes, UA NEGATIVE NEGATIVE   RBC / HPF 0-5 0 - 5 RBC/hpf   WBC, UA 6-30 0 - 5 WBC/hpf   Bacteria, UA RARE (A) NONE SEEN   Squamous Epithelial / LPF 0-5 (A) NONE SEEN   Mucous PRESENT    Hyaline Casts, UA PRESENT    Granular Casts, UA PRESENT    Amorphous Crystal PRESENT    Ct Renal Stone Study  10/10/2014   CLINICAL DATA:  Fever and low back pain.  EXAM: CT ABDOMEN AND PELVIS WITHOUT CONTRAST  TECHNIQUE: Multidetector CT imaging of the abdomen and pelvis was performed following the standard protocol without IV contrast.  COMPARISON:  None.  FINDINGS: There is no urinary calculus. There is no hydronephrosis or ureteral dilatation. There are unremarkable unenhanced appearances of the liver, spleen, pancreas, adrenals and kidneys. The abdominal aorta is normal in caliber. There is no atherosclerotic calcification. There is no adenopathy in the abdomen or pelvis. There is a small hiatal hernia. The small bowel is unremarkable. There is moderate colonic diverticulosis without evidence of diverticulitis or other acute inflammatory process. There is hysterectomy. No adnexal abnormality is evident.  There is no significant abnormality in the lower chest. There is no significant musculoskeletal lesion. There is moderate degenerative disc disease at L5-S1.  IMPRESSION: 1. Colonic diverticulosis. 2. Small hiatal hernia 3. No acute findings are evident in the abdomen or pelvis.   Electronically Signed   By: Andreas Newport M.D.   On: 10/10/2014 05:08    Review of Systems  Constitutional: Positive for fever, chills and malaise/fatigue.  HENT: Negative for sore throat and tinnitus.   Eyes: Negative for blurred vision and redness.    Respiratory: Negative for cough and shortness of breath.   Cardiovascular: Negative for chest pain, palpitations, orthopnea and PND.  Gastrointestinal: Positive for nausea. Negative for vomiting, abdominal pain and diarrhea.  Genitourinary: Negative for dysuria, urgency and frequency.  Musculoskeletal: Positive for back pain. Negative for myalgias and joint pain.  Skin: Negative for rash.       No lesions  Neurological:  Negative for speech change, focal weakness and weakness.  Endo/Heme/Allergies: Does not bruise/bleed easily.       No temperature intolerance  Psychiatric/Behavioral: Negative for depression and suicidal ideas.    Blood pressure 114/63, pulse 82, temperature 97.9 F (36.6 C), temperature source Oral, resp. rate 17, height 5' 9"  (1.753 m), weight 107.457 kg (236 lb 14.4 oz), SpO2 98 %. Physical Exam  Vitals reviewed. Constitutional: She is oriented to person, place, and time. She appears well-developed and well-nourished. No distress.  HENT:  Head: Normocephalic and atraumatic.  Mouth/Throat: Oropharynx is clear and moist.  Eyes: Conjunctivae and EOM are normal. Pupils are equal, round, and reactive to light. No scleral icterus.  Neck: Normal range of motion. Neck supple. No JVD present. No tracheal deviation present. No thyromegaly present.  Cardiovascular: Normal rate, regular rhythm and normal heart sounds.  Exam reveals no gallop and no friction rub.   No murmur heard. Respiratory: Effort normal and breath sounds normal.    GI: Soft. Bowel sounds are normal. She exhibits no distension. There is no tenderness.  Genitourinary:  Deferred  Musculoskeletal: Normal range of motion. She exhibits edema (Trace).  Lymphadenopathy:    She has no cervical adenopathy.  Neurological: She is alert and oriented to person, place, and time. No cranial nerve deficit. She exhibits normal muscle tone.  Skin: Skin is warm and dry. No rash noted. No erythema.  Psychiatric: She has a  normal mood and affect. Her behavior is normal. Judgment and thought content normal.     Assessment/Plan This is a 59 year old Caucasian female admitted for sepsis secondary to pyelonephritis. 1. Sepsis: The patient meets criteria via leukocytosis and fever. Blood cultures obtained in the emergency department and she's been started on Rocephin. She is hemodynamically stable. Follow urine cultures for sensitivities and adjust antibiotic coverage accordingly. 2. Pyelonephritis: Continue to treat the patient with intravenous fluid. Her kidney function has acutely worsened but may resolve with solution of urinary tract infection. 3. Acute kidney injury: Avoid nephrotoxic agents. 4. Obesity: BMI is 35.1; encourage healthy diet and exercise 5. DVT provider is: Heparin 6. GI prophylaxis: None The patient is a full code. Time spent on admission orders and patient care approximately 35 minutes  Harrie Foreman 10/10/2014, 8:07 AM

## 2014-10-10 NOTE — ED Provider Notes (Signed)
[facility] Provider Note  ____________________________________________  Time seen: 3:30AM  I have reviewed the triage vital signs and the nursing notes.   HISTORY  Chief Complaint Fever; Flank Pain; and Vomiting     HPI Emily Pitts is a 59 y.o. female presents with 8 out of 10 left mid back pain fever urinary urgency since Friday. Patient was seen by PMD yesterday and diagnosed with a urinary tract infection and prescribed Cipro. Patient presents tonight via EMS with worsening symptoms hypotension tachycardia and generalized malaise.     Past Medical History  Diagnosis Date  . Hypertension   . Hyperlipidemia   . Allergy   . Diabetes mellitus without complication 4098    Patient Active Problem List   Diagnosis Date Noted  . Abnormal electrocardiogram 10/08/2014  . Nonspecific abnormal electromyogram (EMG) 10/08/2014  . Benign essential HTN 10/08/2014  . Diabetes 10/08/2014  . Dyslipidemia 10/08/2014  . LBP (low back pain) 10/08/2014  . Gastro-esophageal reflux disease without esophagitis 10/08/2014  . Microalbuminuria 10/08/2014  . Disturbance of skin sensation 10/08/2014  . Adiposity 10/08/2014  . Perennial allergic rhinitis with seasonal variation 10/08/2014  . Plantar fasciitis 10/08/2014  . Postablative ovarian failure 10/08/2014  . Feeling stressed out 10/08/2014  . Menopausal symptom 10/08/2014  . Avitaminosis D 10/08/2014  . Urinary tract infectious disease 10/08/2014  . Encounter for screening colonoscopy 05/15/2013    Past Surgical History  Procedure Laterality Date  . Appendectomy  1966  . Abdominal hysterectomy  2010    Current Outpatient Rx  Name  Route  Sig  Dispense  Refill  . aspirin 81 MG tablet   Oral   Take 81 mg by mouth daily.         . ciprofloxacin (CIPRO) 500 MG tablet   Oral   Take 1 tablet (500 mg total) by mouth 2 (two) times daily.   20 tablet   0   . magnesium oxide  (MAG-OX) 400 MG tablet   Oral   Take 400 mg by mouth daily.         . Multiple Vitamins-Minerals (MULTIVITAMIN WITH MINERALS) tablet   Oral   Take 1 tablet by mouth daily.         . Olmesartan-Amlodipine-HCTZ (TRIBENZOR) 40-5-25 MG TABS   Oral   Take 1 tablet by mouth daily.         . vitamin B-12 (CYANOCOBALAMIN) 1000 MCG tablet   Oral   Take 1,000 mcg by mouth daily.         . vitamin C (ASCORBIC ACID) 500 MG tablet   Oral   Take 500 mg by mouth daily.         Marland Kitchen levocetirizine (XYZAL) 5 MG tablet   Oral   Take 5 mg by mouth daily.          . polyethylene glycol powder (GLYCOLAX/MIRALAX) powder      255 grams one bottle for colonoscopy prep   255 g   0     Allergies Review of patient's allergies indicates no known allergies.  Family History  Problem Relation Age of Onset  . Diabetes Mother   . Diabetes Father   . Hypertension Father   . Diabetes Brother     Social History History  Substance Use Topics  . Smoking status: Never Smoker   . Smokeless tobacco: Never Used  . Alcohol Use: No    Review of Systems  Constitutional: Negative for fever. Eyes: Negative  for visual changes. ENT: Negative for sore throat. Cardiovascular: Negative for chest pain. Respiratory: Negative for shortness of breath. Gastrointestinal: Negative for abdominal pain, vomiting and diarrhea. Genitourinary: Negative for dysuria. Musculoskeletal: Positive for back pain. Skin: Negative for rash. Neurological: Negative for headaches, focal weakness or numbness.   10-point ROS otherwise negative.  ____________________________________________   PHYSICAL EXAM:  VITAL SIGNS: ED Triage Vitals  Enc Vitals Group     BP 10/10/14 0331 101/43 mmHg     Pulse Rate 10/10/14 0331 108     Resp 10/10/14 0331 18     Temp 10/10/14 0331 98.1 F (36.7 C)     Temp Source 10/10/14 0331 Oral     SpO2 10/10/14 0331 97 %     Weight 10/10/14 0331 234 lb (106.142 kg)     Height  10/10/14 0331 5\' 9"  (1.753 m)     Head Cir --      Peak Flow --      Pain Score 10/10/14 0331 8     Pain Loc --      Pain Edu? --      Excl. in [place]? --     Constitutional: Alert and oriented. Well appearing and in no distress. Eyes: Conjunctivae are normal. PERRL. Normal extraocular movements. ENT   Head: Normocephalic and atraumatic.   Nose: No congestion/rhinnorhea.   Mouth/Throat: Dry mucous membranes   Neck: No stridor. Cardiovascular: Normal rate, regular rhythm. Normal and symmetric distal pulses are present in all extremities. No murmurs, rubs, or gallops. Respiratory: Normal respiratory effort without tachypnea nor retractions. Breath sounds are clear and equal bilaterally. No wheezes/rales/rhonchi. Gastrointestinal: Soft and nontender. No distention. Positive left CVA tenderness Genitourinary: deferred Musculoskeletal: Nontender with normal range of motion in all extremities. No joint effusions.  No lower extremity tenderness nor edema. Neurologic:  Normal speech and language. No gross focal neurologic deficits are appreciated. Speech is normal.  Skin:  Skin is warm, dry and intact. No rash noted. Psychiatric: Mood and affect are normal. Speech and behavior are normal. Patient exhibits appropriate insight and judgment.  ____________________________________________    LABS (pertinent positives/negatives)  Labs Reviewed  CBC - Abnormal; Notable for the following:    WBC 13.5 (*)    Hemoglobin 10.8 (*)    HCT 32.2 (*)    All other components within normal limits  COMPREHENSIVE METABOLIC PANEL - Abnormal; Notable for the following:    Sodium 134 (*)    Potassium 3.3 (*)    Chloride 97 (*)    Glucose, Bld 180 (*)    BUN 36 (*)    Creatinine, Ser 2.35 (*)    Calcium 8.3 (*)    Albumin 2.9 (*)    AST 97 (*)    ALT 95 (*)    Alkaline Phosphatase 134 (*)    Total Bilirubin 2.7 (*)    GFR calc non Af Amer 21 (*)    GFR calc Af Amer 25 (*)    All other  components within normal limits  URINE CULTURE  CULTURE, BLOOD (ROUTINE X 2)  CULTURE, BLOOD (ROUTINE X 2)  LACTIC ACID, PLASMA  URINALYSIS COMPLETEWITH MICROSCOPIC ([facility] ONLY)  LACTIC ACID, PLASMA     ____________________________________________   EKG  ED ECG REPORT I, Alexcia Schools, E. Lopez N, the attending physician, personally viewed and interpreted this ECG.   Date: 10/10/2014  EKG Time: 2:16AM  Rate: 95  Rhythm: Normal sinus rhythm  Axis: none  Intervals: normal  ST&T Change: None  ____________________________________________    RADIOLOGY    CT RENAL STONE STUDY (Final result) Result time: 10/10/14 05:08:19   Final result by Rad Results In Interface (10/10/14 05:08:19)   Narrative:   CLINICAL DATA: Fever and low back pain.  EXAM: CT ABDOMEN AND PELVIS WITHOUT CONTRAST  TECHNIQUE: Multidetector CT imaging of the abdomen and pelvis was performed following the standard protocol without IV contrast.  COMPARISON: None.  FINDINGS: There is no urinary calculus. There is no hydronephrosis or ureteral dilatation. There are unremarkable unenhanced appearances of the liver, spleen, pancreas, adrenals and kidneys. The abdominal aorta is normal in caliber. There is no atherosclerotic calcification. There is no adenopathy in the abdomen or pelvis. There is a small hiatal hernia. The small bowel is unremarkable. There is moderate colonic diverticulosis without evidence of diverticulitis or other acute inflammatory process. There is hysterectomy. No adnexal abnormality is evident.  There is no significant abnormality in the lower chest. There is no significant musculoskeletal lesion. There is moderate degenerative disc disease at L5-S1.  IMPRESSION: 1. Colonic diverticulosis. 2. Small hiatal hernia 3. No acute findings are evident in the abdomen or pelvis.   Electronically Signed By: Andreas Newport M.D. On: 10/10/2014 05:08   Critical care: 30  minutes     INITIAL IMPRESSION / ASSESSMENT AND PLAN / ED COURSE  Pertinent labs & imaging results that were available during my care of the patient were reviewed by me and considered in my medical decision making (see chart for details).  History physical exam consistent with acute pyelonephritis patient meets SIRS criteria as such IV ceftriaxone given urine cultures obtained patient received 2 L normal saline. Tachycardia resolved and blood pressure improved please refer to vital sheet for specifics numerical data  ____________________________________________   FINAL CLINICAL IMPRESSION(S) / ED DIAGNOSES  Final diagnoses:  Left flank pain  Pyelonephritis  Renal insufficiency  Hyperbilirubinemia      Gregor Hams, MD 10/10/14 [phone]

## 2014-10-11 LAB — BASIC METABOLIC PANEL
Anion gap: 8 (ref 5–15)
BUN: 24 mg/dL — ABNORMAL HIGH (ref 6–20)
CO2: 23 mmol/L (ref 22–32)
Calcium: 8.1 mg/dL — ABNORMAL LOW (ref 8.9–10.3)
Chloride: 105 mmol/L (ref 101–111)
Creatinine, Ser: 1.26 mg/dL — ABNORMAL HIGH (ref 0.44–1.00)
GFR calc Af Amer: 53 mL/min — ABNORMAL LOW (ref >60)
GFR calc non Af Amer: 45 mL/min — ABNORMAL LOW (ref >60)
Glucose, Bld: 177 mg/dL — ABNORMAL HIGH (ref 65–99)
Potassium: 3.1 mmol/L — ABNORMAL LOW (ref 3.5–5.1)
SODIUM: 136 mmol/L (ref 135–145)

## 2014-10-11 LAB — CBC
HCT: 31.3 % — ABNORMAL LOW (ref 35.0–47.0)
Hemoglobin: 10.4 g/dL — ABNORMAL LOW (ref 12.0–16.0)
MCH: 28.3 pg (ref 26.0–34.0)
MCHC: 33.1 g/dL (ref 32.0–36.0)
MCV: 85.3 fL (ref 80.0–100.0)
PLATELETS: 176 10*3/uL (ref 150–440)
RBC: 3.67 MIL/uL — AB (ref 3.80–5.20)
RDW: 14.7 % — ABNORMAL HIGH (ref 11.5–14.5)
WBC: 9.7 10*3/uL (ref 3.6–11.0)

## 2014-10-11 LAB — GLUCOSE, CAPILLARY
Glucose-Capillary: 156 mg/dL — ABNORMAL HIGH (ref 65–99)
Glucose-Capillary: 188 mg/dL — ABNORMAL HIGH (ref 65–99)

## 2014-10-11 MED ORDER — POTASSIUM CHLORIDE 20 MEQ PO PACK
40.0000 meq | PACK | Freq: Once | ORAL | Status: AC
  Administered 2014-10-11: 40 meq via ORAL
  Filled 2014-10-11: qty 2

## 2014-10-11 MED ORDER — INSULIN ASPART 100 UNIT/ML ~~LOC~~ SOLN
0.0000 [IU] | Freq: Three times a day (TID) | SUBCUTANEOUS | Status: DC
  Administered 2014-10-11 – 2014-10-12 (×2): 2 [IU] via SUBCUTANEOUS
  Filled 2014-10-11 (×2): qty 2

## 2014-10-11 NOTE — Progress Notes (Signed)
[facility] [place] NAME: Emily Pitts    MR#:  [id number]  DATE OF BIRTH:  09-Aug-1955  SUBJECTIVE:  CHIEF COMPLAINT:  Patient is feeling better today. Denies any nausea or vomiting. Tolerating by mouth fine. Denies any abdominal pain  REVIEW OF SYSTEMS:  CONSTITUTIONAL: No fever, reporting fatigue and weakness.  EYES: No blurred or double vision.  EARS, NOSE, AND THROAT: No tinnitus or ear pain.  RESPIRATORY: No cough, shortness of breath, wheezing or hemoptysis.  CARDIOVASCULAR: No chest pain, orthopnea, edema.  GASTROINTESTINAL: No nausea, vomiting, diarrhea or abdominal pain.  GENITOURINARY: No dysuria, hematuria.  ENDOCRINE: No polyuria, nocturia,  HEMATOLOGY: No anemia, easy bruising or bleeding SKIN: No rash or lesion. MUSCULOSKELETAL: No joint pain or arthritis. Reporting back pain   NEUROLOGIC: No tingling, numbness, weakness.  PSYCHIATRY: No anxiety or depression.   DRUG ALLERGIES:  No Known Allergies  VITALS:  Blood pressure 112/53, pulse 86, temperature 98.3 F (36.8 C), temperature source Oral, resp. rate 18, height 5\' 9"  (1.753 m), weight 109.544 kg (241 lb 8 oz), SpO2 95 %.  PHYSICAL EXAMINATION:  GENERAL:  59 y.o.-year-old patient lying in the bed with no acute distress.  EYES: Pupils equal, round, reactive to light and accommodation. No scleral icterus. Extraocular muscles intact.  HEENT: Head atraumatic, normocephalic. Oropharynx and nasopharynx clear.  NECK:  Supple, no jugular venous distention. No thyroid enlargement, no tenderness.  LUNGS: Normal breath sounds bilaterally, no wheezing, rales,rhonchi or crepitation. No use of accessory muscles of respiration.  CARDIOVASCULAR: S1, S2 normal. No murmurs, rubs, or gallops.  ABDOMEN: Soft, nontender, nondistended. Bowel sounds present. No organomegaly or mass. No CVA tenderness EXTREMITIES: No pedal edema, cyanosis, or clubbing.  NEUROLOGIC: Cranial nerves II  through XII are intact. Muscle strength 5/5 in all extremities. Sensation intact. Gait not checked.  PSYCHIATRIC: The patient is alert and oriented x 3.  SKIN: No obvious rash, lesion, or ulcer.    LABORATORY PANEL:   CBC  Recent Labs Lab 10/11/14 0420  WBC 9.7  HGB 10.4*  HCT 31.3*  PLT 176   ------------------------------------------------------------------------------------------------------------------  Chemistries   Recent Labs Lab 10/10/14 0351 10/11/14 0420  NA 134* 136  K 3.3* 3.1*  CL 97* 105  CO2 22 23  GLUCOSE 180* 177*  BUN 36* 24*  CREATININE 2.35* 1.26*  CALCIUM 8.3* 8.1*  AST 97*  --   ALT 95*  --   ALKPHOS 134*  --   BILITOT 2.7*  --    ------------------------------------------------------------------------------------------------------------------  Cardiac Enzymes No results for input(s): TROPONINI in the last 168 hours. ------------------------------------------------------------------------------------------------------------------  RADIOLOGY:  US Renal  10/10/2014   CLINICAL DATA:  Acute kidney injury  EXAM: RENAL / URINARY TRACT ULTRASOUND COMPLETE  COMPARISON:  CT abdomen pelvis 10/10/2014  FINDINGS: Right Kidney:  Length: 11.7 cm. Echogenicity within normal limits. No mass or hydronephrosis visualized.  Left Kidney:  Length: 12.1 cm. Echogenicity within normal limits. No mass or hydronephrosis visualized.  Bladder:  Urinate bladder not visualized and empty. The patient voided prior to the study.  IMPRESSION: Negative   Electronically Signed   By: Franchot Gallo M.D.   On: 10/10/2014 10:54   Ct Renal Stone Study  10/10/2014   CLINICAL DATA:  Fever and low back pain.  EXAM: CT ABDOMEN AND PELVIS WITHOUT CONTRAST  TECHNIQUE: Multidetector CT imaging of the abdomen and pelvis was performed following the standard protocol without IV contrast.  COMPARISON:  None.  FINDINGS: There  is no urinary calculus. There is no hydronephrosis or ureteral  dilatation. There are unremarkable unenhanced appearances of the liver, spleen, pancreas, adrenals and kidneys. The abdominal aorta is normal in caliber. There is no atherosclerotic calcification. There is no adenopathy in the abdomen or pelvis. There is a small hiatal hernia. The small bowel is unremarkable. There is moderate colonic diverticulosis without evidence of diverticulitis or other acute inflammatory process. There is hysterectomy. No adnexal abnormality is evident.  There is no significant abnormality in the lower chest. There is no significant musculoskeletal lesion. There is moderate degenerative disc disease at L5-S1.  IMPRESSION: 1. Colonic diverticulosis. 2. Small hiatal hernia 3. No acute findings are evident in the abdomen or pelvis.   Electronically Signed   By: Andreas Newport M.D.   On: 10/10/2014 05:08    EKG:  No orders found for this or any previous visit.  ASSESSMENT AND PLAN:    1. Sepsis: The patient meets criteria via leukocytosis and fever.  Follow-up Blood cultures and urine cultures are negative to date Continue on Rocephin, will discontinue antibiotics if there is no growth on cultures in the next 24 hours  2. Pyelonephritis:  Continue to treat the patient with intravenous fluid. Continue IV antibiotics and follow-up on the urine culture and sensitivity for 24 more hours  3. Acute kidney injury: Secondary to prerenal and renal  Improving. Avoid nephrotoxic agents Continue hydration with IV fluids Renal ultrasound is normal Check BMP in a.m.  4. History of diabetes mellitus Diabetic diet, sliding scale insulin Check hemoglobin A1c in a.m.  . 5. Obesity: BMI is 35.1; encourage healthy diet and exercise 5. DVT provider is: Heparin 6. GI prophylaxis: None    All the records are reviewed and case discussed with Care Management/Social Workerr. Management plans discussed with the patient, family and they are in agreement.  CODE STATUS: Full  code  TOTAL TIME TAKING CARE OF THIS PATIENT: Reviewing medical records, labs, follow-up visit, new orders and coordination of care-35 minutes.   POSSIBLE D/C IN 1-  2 DAYS, DEPENDING ON CLINICAL CONDITION.   Nicholes Mango M.D on 10/11/2014 at 2:46 PM  Between 7am to 6pm - Pager - [phone] After 6pm go to [email or web address] - password [email or web address] [facility]  Office  [phone]  CC: Primary care physician; Loistine Chance, MD

## 2014-10-12 LAB — URINE CULTURE: CULTURE: NO GROWTH

## 2014-10-12 LAB — CBC
HEMATOCRIT: 30.9 % — AB (ref 35.0–47.0)
Hemoglobin: 10.5 g/dL — ABNORMAL LOW (ref 12.0–16.0)
MCH: 28.8 pg (ref 26.0–34.0)
MCHC: 33.9 g/dL (ref 32.0–36.0)
MCV: 84.9 fL (ref 80.0–100.0)
Platelets: 212 10*3/uL (ref 150–440)
RBC: 3.64 MIL/uL — ABNORMAL LOW (ref 3.80–5.20)
RDW: 14.6 % — ABNORMAL HIGH (ref 11.5–14.5)
WBC: 13 10*3/uL — ABNORMAL HIGH (ref 3.6–11.0)

## 2014-10-12 LAB — BASIC METABOLIC PANEL
ANION GAP: 8 (ref 5–15)
BUN: 13 mg/dL (ref 6–20)
CALCIUM: 8.6 mg/dL — AB (ref 8.9–10.3)
CO2: 29 mmol/L (ref 22–32)
Chloride: 103 mmol/L (ref 101–111)
Creatinine, Ser: 0.9 mg/dL (ref 0.44–1.00)
GFR calc non Af Amer: >60 mL/min (ref >60)
GLUCOSE: 152 mg/dL — AB (ref 65–99)
POTASSIUM: 3.2 mmol/L — AB (ref 3.5–5.1)
SODIUM: 140 mmol/L (ref 135–145)

## 2014-10-12 LAB — GLUCOSE, CAPILLARY: Glucose-Capillary: 175 mg/dL — ABNORMAL HIGH (ref 65–99)

## 2014-10-12 LAB — HEMOGLOBIN A1C: HEMOGLOBIN A1C: 6.9 % — AB (ref 4.0–6.0)

## 2014-10-12 LAB — MAGNESIUM: MAGNESIUM: 2 mg/dL (ref 1.7–2.4)

## 2014-10-12 MED ORDER — POTASSIUM CHLORIDE 20 MEQ PO PACK
40.0000 meq | PACK | Freq: Once | ORAL | Status: AC
  Administered 2014-10-12: 40 meq via ORAL
  Filled 2014-10-12: qty 2

## 2014-10-12 MED ORDER — HYDROCHLOROTHIAZIDE 25 MG PO TABS: 25.0000 mg | ORAL_TABLET | Freq: Every day | ORAL | Status: DC

## 2014-10-12 MED ORDER — IRBESARTAN 300 MG PO TABS: 300.0000 mg | ORAL_TABLET | Freq: Every day | ORAL | Status: DC

## 2014-10-12 MED ORDER — AMLODIPINE BESYLATE 5 MG PO TABS: 5.0000 mg | ORAL_TABLET | Freq: Every day | ORAL | Status: DC

## 2014-10-12 MED ORDER — ACETAMINOPHEN 325 MG PO TABS: 650.0000 mg | ORAL_TABLET | Freq: Four times a day (QID) | ORAL | Status: DC | PRN

## 2014-10-12 NOTE — Discharge Instructions (Signed)
Activity as tolerated Diet-low salt diabetic Follow-up with primary care physician in a week or sooner as needed

## 2014-10-12 NOTE — Discharge Summary (Signed)
[facility] [place] NAME: Emily Pitts    MR#:  [id number]  DATE OF BIRTH:  09/09/1955  DATE OF ADMISSION:  10/10/2014 ADMITTING PHYSICIAN: Harrie Foreman, MD  DATE OF DISCHARGE: 10/12/2014 PRIMARY CARE PHYSICIAN: Loistine Chance, MD    ADMISSION DIAGNOSIS:  Hyperbilirubinemia [E80.6] Pyelonephritis [N12] Renal insufficiency [N28.9] Left flank pain [R10.9]  DISCHARGE DIAGNOSIS:  Active Problems:   Sepsis  acute kidney injury SECONDARY DIAGNOSIS:   Past Medical History  Diagnosis Date  . Hypertension   . Hyperlipidemia   . Allergy   . Diabetes mellitus without complication 0347    HOSPITAL COURSE:   1. Sepsis: The patient meets criteria via leukocytosis and fever.  Follow-up Blood cultures and urine cultures are negative to date Continued IV Rocephin for 3 days, will discontinue antibiotics as there is no growth on cultures so far and patient is clinically improving  2. Pyelonephritis: Given intravenous fluid. Received IV Rocephin for 3 days and discontinue antibiotics  First urine culture is contaminated with mixed flora, repeat urine culture is negative for any growth for the first 24 hours   3. Acute kidney injury: Secondary to prerenal and renal  Improved with IV fluids, back to normal Avoid nephrotoxic agents Renal ultrasound is normal . Hypokalemia replace potassium with potassium chloride  4. History of diabetes mellitus -hemoglobin A1c at 7.4 Patient prefers following strict Diabetic diet and if no improvement will consider by mouth medications but not at this time sliding scale insulin provided during the hospital course  . 5. Obesity: BMI is 35.1; encourage healthy diet and exercise 5. DVT prophylaxis provided with heparin    DISCHARGE CONDITIONS:   fair   CONSULTS OBTAINED:      PROCEDURES NONE   DRUG ALLERGIES:  No Known Allergies  DISCHARGE MEDICATIONS:   Current Discharge  Medication List    START taking these medications   Details  acetaminophen (TYLENOL) 325 MG tablet Take 2 tablets (650 mg total) by mouth every 6 (six) hours as needed for mild pain (or Fever >/= 101).    amLODipine (NORVASC) 5 MG tablet Take 1 tablet (5 mg total) by mouth daily. Qty: 30 tablet, Refills: 0    hydrochlorothiazide (HYDRODIURIL) 25 MG tablet Take 1 tablet (25 mg total) by mouth daily. Qty: 30 tablet, Refills: 0    irbesartan (AVAPRO) 300 MG tablet Take 1 tablet (300 mg total) by mouth daily. Qty: 30 tablet, Refills: 0      CONTINUE these medications which have NOT CHANGED   Details  aspirin 81 MG tablet Take 81 mg by mouth daily.    ciprofloxacin (CIPRO) 500 MG tablet Take 1 tablet (500 mg total) by mouth 2 (two) times daily. Qty: 20 tablet, Refills: 0   Associated Diagnoses: Urinary tract infection without hematuria, site unspecified    magnesium oxide (MAG-OX) 400 MG tablet Take 400 mg by mouth daily.    Multiple Vitamins-Minerals (MULTIVITAMIN WITH MINERALS) tablet Take 1 tablet by mouth daily.    Olmesartan-Amlodipine-HCTZ (TRIBENZOR) 40-5-25 MG TABS Take 1 tablet by mouth daily.    vitamin B-12 (CYANOCOBALAMIN) 1000 MCG tablet Take 1,000 mcg by mouth daily.    vitamin C (ASCORBIC ACID) 500 MG tablet Take 500 mg by mouth daily.    levocetirizine (XYZAL) 5 MG tablet Take 5 mg by mouth daily.     polyethylene glycol powder (GLYCOLAX/MIRALAX) powder 255 grams one bottle for colonoscopy prep Qty: 255 g, Refills: 0  Associated Diagnoses: Encounter for screening colonoscopy         DISCHARGE INSTRUCTIONS:   activity as tolerated   follow-up with primary care physician in a week    DIET:  Low fat, Low cholesterol diet, diabetic diet   DISCHARGE CONDITION:  Fair  ACTIVITY:  Activity as tolerated  OXYGEN:  Home Oxygen: No.   Oxygen Delivery: room air  DISCHARGE LOCATION:  home   If you experience worsening of your admission symptoms, develop  shortness of breath, life threatening emergency, suicidal or homicidal thoughts you must seek medical attention immediately by calling 911 or calling your MD immediately  if symptoms less severe.  You Must read complete instructions/literature along with all the possible adverse reactions/side effects for all the Medicines you take and that have been prescribed to you. Take any new Medicines after you have completely understood and accpet all the possible adverse reactions/side effects.   Please note  You were cared for by a hospitalist during your hospital stay. If you have any questions about your discharge medications or the care you received while you were in the hospital after you are discharged, you can call the unit and asked to speak with the hospitalist on call if the hospitalist that took care of you is not available. Once you are discharged, your primary care physician will handle any further medical issues. Please note that NO REFILLS for any discharge medications will be authorized once you are discharged, as it is imperative that you return to your primary care physician (or establish a relationship with a primary care physician if you do not have one) for your aftercare needs so that they can reassess your need for medications and monitor your lab values.     Today  Chief Complaint  Patient presents with  . Fever  . Flank Pain  . Vomiting    patient is feeling fine. Denies any complaints. Denies any abdominal pain, nausea or vomiting. Wants to go home.   ROS: denies any CONSTITUTIONAL: Denies fevers, chills. Denies any fatigue, weakness.  EYES: Denies blurry vision, double vision, eye pain. EARS, NOSE, THROAT: Denies tinnitus, ear pain, hearing loss. RESPIRATORY: Denies cough, wheeze, shortness of breath.  CARDIOVASCULAR: Denies chest pain, palpitations, edema.  GASTROINTESTINAL: Denies nausea, vomiting, diarrhea, abdominal pain. Denies bright red blood per  rectum. GENITOURINARY: Denies dysuria, hematuria. ENDOCRINE: Denies nocturia or thyroid problems. HEMATOLOGIC AND LYMPHATIC: Denies easy bruising or bleeding. SKIN: Denies rash or lesion. MUSCULOSKELETAL: Denies pain in neck, back, shoulder, knees, hips or arthritic symptoms.  NEUROLOGIC: Denies paralysis, paresthesias.  PSYCHIATRIC: Denies anxiety or depressive symptoms.   VITAL SIGNS:  Blood pressure 120/58, pulse 79, temperature 98.4 F (36.9 C), temperature source Oral, resp. rate 20, height 5\' 9"  (1.753 m), weight 109.544 kg (241 lb 8 oz), SpO2 98 %.  I/O:   Intake/Output Summary (Last 24 hours) at 10/12/14 1109 Last data filed at 10/12/14 1108  Gross per 24 hour  Intake   2044 ml  Output   3850 ml  Net  -1806 ml    PHYSICAL EXAMINATION:  GENERAL:  58 y.o.-year-old patient lying in the bed with no acute distress.  EYES: Pupils equal, round, reactive to light and accommodation. No scleral icterus. Extraocular muscles intact.  HEENT: Head atraumatic, normocephalic. Oropharynx and nasopharynx clear.  NECK:  Supple, no jugular venous distention. No thyroid enlargement, no tenderness.  LUNGS: Normal breath sounds bilaterally, no wheezing, rales,rhonchi or crepitation. No use of accessory muscles of respiration.  CARDIOVASCULAR:  S1, S2 normal. No murmurs, rubs, or gallops.  ABDOMEN: Soft, non-tender, non-distended. Bowel sounds present. No organomegaly or mass.  EXTREMITIES: No pedal edema, cyanosis, or clubbing.  NEUROLOGIC: Cranial nerves II through XII are intact. Muscle strength 5/5 in all extremities. Sensation intact. Gait not checked.  PSYCHIATRIC: The patient is alert and oriented x 3.  SKIN: No obvious rash, lesion, or ulcer.   DATA REVIEW:   CBC  Recent Labs Lab 10/12/14 0444  WBC 13.0*  HGB 10.5*  HCT 30.9*  PLT 212    Chemistries   Recent Labs Lab 10/10/14 0351  10/12/14 0444  NA 134*  < > 140  K 3.3*  < > 3.2*  CL 97*  < > 103  CO2 22  < > 29   GLUCOSE 180*  < > 152*  BUN 36*  < > 13  CREATININE 2.35*  < > 0.90  CALCIUM 8.3*  < > 8.6*  MG  --   --  2.0  AST 97*  --   --   ALT 95*  --   --   ALKPHOS 134*  --   --   BILITOT 2.7*  --   --   < > = values in this interval not displayed.  Cardiac Enzymes No results for input(s): TROPONINI in the last 168 hours.  Microbiology Results  Results for orders placed or performed during the hospital encounter of 10/10/14  Blood culture (routine x 2)     Status: None (Preliminary result)   Collection Time: 10/10/14  3:51 AM  Result Value Ref Range Status   Specimen Description BLOOD LEFT ASSIST CONTROL  Final   Special Requests BOTTLES DRAWN AEROBIC AND ANAEROBIC 1CC  Final   Culture NO GROWTH 2 DAYS  Final   Report Status PENDING  Incomplete  Blood culture (routine x 2)     Status: None (Preliminary result)   Collection Time: 10/10/14  3:51 AM  Result Value Ref Range Status   Specimen Description BLOOD LEFT HAND  Final   Special Requests BOTTLES DRAWN AEROBIC AND ANAEROBIC 1CC  Final   Culture NO GROWTH 2 DAYS  Final   Report Status PENDING  Incomplete  Urine culture     Status: None (Preliminary result)   Collection Time: 10/10/14  7:16 AM  Result Value Ref Range Status   Specimen Description URINE, CLEAN CATCH  Final   Special Requests NONE  Final   Culture NO GROWTH < 24 HOURS  Final   Report Status PENDING  Incomplete    RADIOLOGY:  US Renal  10/10/2014   CLINICAL DATA:  Acute kidney injury  EXAM: RENAL / URINARY TRACT ULTRASOUND COMPLETE  COMPARISON:  CT abdomen pelvis 10/10/2014  FINDINGS: Right Kidney:  Length: 11.7 cm. Echogenicity within normal limits. No mass or hydronephrosis visualized.  Left Kidney:  Length: 12.1 cm. Echogenicity within normal limits. No mass or hydronephrosis visualized.  Bladder:  Urinate bladder not visualized and empty. The patient voided prior to the study.  IMPRESSION: Negative   Electronically Signed   By: Franchot Gallo M.D.   On: 10/10/2014  10:54   Ct Renal Stone Study  10/10/2014   CLINICAL DATA:  Fever and low back pain.  EXAM: CT ABDOMEN AND PELVIS WITHOUT CONTRAST  TECHNIQUE: Multidetector CT imaging of the abdomen and pelvis was performed following the standard protocol without IV contrast.  COMPARISON:  None.  FINDINGS: There is no urinary calculus. There is no hydronephrosis or ureteral dilatation. There  are unremarkable unenhanced appearances of the liver, spleen, pancreas, adrenals and kidneys. The abdominal aorta is normal in caliber. There is no atherosclerotic calcification. There is no adenopathy in the abdomen or pelvis. There is a small hiatal hernia. The small bowel is unremarkable. There is moderate colonic diverticulosis without evidence of diverticulitis or other acute inflammatory process. There is hysterectomy. No adnexal abnormality is evident.  There is no significant abnormality in the lower chest. There is no significant musculoskeletal lesion. There is moderate degenerative disc disease at L5-S1.  IMPRESSION: 1. Colonic diverticulosis. 2. Small hiatal hernia 3. No acute findings are evident in the abdomen or pelvis.   Electronically Signed   By: Andreas Newport M.D.   On: 10/10/2014 05:08    EKG:  No orders found for this or any previous visit.    Management plans discussed with the patient, family and they are in agreement.  CODE STATUS:     Code Status Orders        Start     Ordered   10/10/14 0742  Full code   Continuous     10/10/14 0741      TOTAL TIME TAKING CARE OF THIS PATIENT:  reviewing medical records, coordination of care, follow-up with the patient and discharge orders including summary -40 minutes.    @[facility] @  on 10/12/2014 at 11:09 AM  Between 7am to 6pm - Pager - [phone]  After 6pm go to [email or web address] - password [email or web address] [facility]  Office  [phone]  CC: Primary care physician; Loistine Chance, MD

## 2014-10-12 NOTE — Progress Notes (Signed)
Pt stable. Pt d/c instructions given and education provided. Question answered. Prescriptions given. IV removed. Will be escorted out by staff and driven home by family.

## 2014-10-15 ENCOUNTER — Telehealth: Payer: Self-pay | Admitting: Family Medicine

## 2014-10-15 LAB — CULTURE, BLOOD (ROUTINE X 2): Culture: NO GROWTH

## 2014-10-15 NOTE — Telephone Encounter (Signed)
Pt is requesting a referral due to gall bladder issues. Came home Friday and was having severe back pain mainly on the left side. Began to feel nauseated and constantly belching.

## 2014-10-15 NOTE — Telephone Encounter (Signed)
Pt nnotified she states already has an appt on the 24th

## 2014-10-15 NOTE — Telephone Encounter (Signed)
Gallbladder is on the right side, I need to see her

## 2014-10-16 ENCOUNTER — Telehealth: Payer: Self-pay | Admitting: Family Medicine

## 2014-10-16 NOTE — Telephone Encounter (Signed)
Pt has a hosp fu appt with Dr Ancil Boozer on 11/06/14 and pt wants to know if she can get a dr note to go back to work tomorrow.

## 2014-10-16 NOTE — Telephone Encounter (Signed)
Emily Pitts spoke with the patient and she is feeling better therefore appointment was not made. She does have one for later on this month.

## 2014-10-16 NOTE — Telephone Encounter (Signed)
Can you see if the patient can come in tomorrow?

## 2014-10-16 NOTE — Telephone Encounter (Signed)
LMOM to inform pt °

## 2014-10-16 NOTE — Telephone Encounter (Signed)
I guess first availble acute?

## 2014-10-16 NOTE — Telephone Encounter (Signed)
I can't give her a note without seeing her

## 2014-10-16 NOTE — Telephone Encounter (Signed)
Where would you like for me to put her?

## 2014-10-22 ENCOUNTER — Inpatient Hospital Stay
Admission: EM | Admit: 2014-10-22 | Discharge: 2014-10-25 | DRG: 872 | Disposition: A | Discharge status: Home / Self Care | Payer: BC Managed Care – PPO | Attending: Internal Medicine | Admitting: Internal Medicine

## 2014-10-22 ENCOUNTER — Encounter: Payer: Self-pay | Admitting: Family Medicine

## 2014-10-22 ENCOUNTER — Emergency Department: Payer: BC Managed Care – PPO

## 2014-10-22 ENCOUNTER — Encounter: Payer: Self-pay | Admitting: Emergency Medicine

## 2014-10-22 ENCOUNTER — Ambulatory Visit (INDEPENDENT_AMBULATORY_CARE_PROVIDER_SITE_OTHER): Payer: BC Managed Care – PPO | Admitting: Family Medicine

## 2014-10-22 VITALS — BP 80/36 | HR 100 | Temp 97.6°F | Resp 22 | Ht 69.0 in | Wt 208.7 lb

## 2014-10-22 DIAGNOSIS — E119 Type 2 diabetes mellitus without complications: Secondary | ICD-10-CM | POA: Diagnosis present

## 2014-10-22 DIAGNOSIS — Z90710 Acquired absence of both cervix and uterus: Secondary | ICD-10-CM

## 2014-10-22 DIAGNOSIS — R634 Abnormal weight loss: Secondary | ICD-10-CM | POA: Diagnosis not present

## 2014-10-22 DIAGNOSIS — E785 Hyperlipidemia, unspecified: Secondary | ICD-10-CM | POA: Diagnosis present

## 2014-10-22 DIAGNOSIS — E559 Vitamin D deficiency, unspecified: Secondary | ICD-10-CM | POA: Diagnosis present

## 2014-10-22 DIAGNOSIS — Z9049 Acquired absence of other specified parts of digestive tract: Secondary | ICD-10-CM | POA: Diagnosis present

## 2014-10-22 DIAGNOSIS — Z833 Family history of diabetes mellitus: Secondary | ICD-10-CM

## 2014-10-22 DIAGNOSIS — R110 Nausea: Secondary | ICD-10-CM

## 2014-10-22 DIAGNOSIS — R05 Cough: Secondary | ICD-10-CM

## 2014-10-22 DIAGNOSIS — R058 Other specified cough: Secondary | ICD-10-CM

## 2014-10-22 DIAGNOSIS — R1011 Right upper quadrant pain: Secondary | ICD-10-CM | POA: Diagnosis not present

## 2014-10-22 DIAGNOSIS — N179 Acute kidney failure, unspecified: Secondary | ICD-10-CM | POA: Diagnosis present

## 2014-10-22 DIAGNOSIS — N390 Urinary tract infection, site not specified: Secondary | ICD-10-CM | POA: Diagnosis present

## 2014-10-22 DIAGNOSIS — I959 Hypotension, unspecified: Secondary | ICD-10-CM

## 2014-10-22 DIAGNOSIS — E871 Hypo-osmolality and hyponatremia: Secondary | ICD-10-CM | POA: Diagnosis present

## 2014-10-22 DIAGNOSIS — R55 Syncope and collapse: Secondary | ICD-10-CM | POA: Diagnosis not present

## 2014-10-22 DIAGNOSIS — I10 Essential (primary) hypertension: Secondary | ICD-10-CM | POA: Diagnosis present

## 2014-10-22 DIAGNOSIS — R0600 Dyspnea, unspecified: Secondary | ICD-10-CM

## 2014-10-22 DIAGNOSIS — Z8042 Family history of malignant neoplasm of prostate: Secondary | ICD-10-CM | POA: Diagnosis not present

## 2014-10-22 DIAGNOSIS — E860 Dehydration: Secondary | ICD-10-CM | POA: Diagnosis present

## 2014-10-22 DIAGNOSIS — Z8619 Personal history of other infectious and parasitic diseases: Secondary | ICD-10-CM

## 2014-10-22 DIAGNOSIS — Z79899 Other long term (current) drug therapy: Secondary | ICD-10-CM | POA: Diagnosis not present

## 2014-10-22 DIAGNOSIS — A419 Sepsis, unspecified organism: Secondary | ICD-10-CM | POA: Diagnosis not present

## 2014-10-22 DIAGNOSIS — Z8051 Family history of malignant neoplasm of kidney: Secondary | ICD-10-CM | POA: Diagnosis not present

## 2014-10-22 DIAGNOSIS — Z7982 Long term (current) use of aspirin: Secondary | ICD-10-CM

## 2014-10-22 DIAGNOSIS — E1129 Type 2 diabetes mellitus with other diabetic kidney complication: Secondary | ICD-10-CM | POA: Diagnosis not present

## 2014-10-22 DIAGNOSIS — N12 Tubulo-interstitial nephritis, not specified as acute or chronic: Secondary | ICD-10-CM | POA: Diagnosis not present

## 2014-10-22 DIAGNOSIS — Z825 Family history of asthma and other chronic lower respiratory diseases: Secondary | ICD-10-CM | POA: Diagnosis not present

## 2014-10-22 DIAGNOSIS — Z87440 Personal history of urinary (tract) infections: Secondary | ICD-10-CM | POA: Diagnosis not present

## 2014-10-22 DIAGNOSIS — E43 Unspecified severe protein-calorie malnutrition: Secondary | ICD-10-CM | POA: Insufficient documentation

## 2014-10-22 DIAGNOSIS — Z8249 Family history of ischemic heart disease and other diseases of the circulatory system: Secondary | ICD-10-CM

## 2014-10-22 DIAGNOSIS — R6510 Systemic inflammatory response syndrome (SIRS) of non-infectious origin without acute organ dysfunction: Secondary | ICD-10-CM

## 2014-10-22 LAB — URINALYSIS COMPLETE WITH MICROSCOPIC (ARMC ONLY)
Bacteria, UA: NONE SEEN
Bilirubin Urine: NEGATIVE
Glucose, UA: NEGATIVE mg/dL
Ketones, ur: NEGATIVE mg/dL
Leukocytes, UA: NEGATIVE
Nitrite: NEGATIVE
Protein, ur: NEGATIVE mg/dL
Specific Gravity, Urine: 1.002 — ABNORMAL LOW (ref 1.005–1.030)
pH: 7 (ref 5.0–8.0)

## 2014-10-22 LAB — COMPREHENSIVE METABOLIC PANEL WITH GFR
ALT: 43 U/L (ref 14–54)
AST: 36 U/L (ref 15–41)
Albumin: 3.6 g/dL (ref 3.5–5.0)
Alkaline Phosphatase: 129 U/L — ABNORMAL HIGH (ref 38–126)
Anion gap: 13 (ref 5–15)
BUN: 29 mg/dL — ABNORMAL HIGH (ref 6–20)
CO2: 23 mmol/L (ref 22–32)
Calcium: 9.2 mg/dL (ref 8.9–10.3)
Chloride: 96 mmol/L — ABNORMAL LOW (ref 101–111)
Creatinine, Ser: 2.21 mg/dL — ABNORMAL HIGH (ref 0.44–1.00)
GFR calc Af Amer: 27 mL/min — ABNORMAL LOW
GFR calc non Af Amer: 23 mL/min — ABNORMAL LOW
Glucose, Bld: 104 mg/dL — ABNORMAL HIGH (ref 65–99)
Potassium: 4.1 mmol/L (ref 3.5–5.1)
Sodium: 132 mmol/L — ABNORMAL LOW (ref 135–145)
Total Bilirubin: 1 mg/dL (ref 0.3–1.2)
Total Protein: 8.7 g/dL — ABNORMAL HIGH (ref 6.5–8.1)

## 2014-10-22 LAB — GLUCOSE, CAPILLARY
GLUCOSE-CAPILLARY: 120 mg/dL — AB (ref 65–99)
Glucose-Capillary: 85 mg/dL (ref 65–99)
Glucose-Capillary: 96 mg/dL (ref 65–99)

## 2014-10-22 LAB — CBC WITH DIFFERENTIAL/PLATELET
Basophils Absolute: 0 K/uL (ref 0–0.1)
Basophils Relative: 0 %
Eosinophils Absolute: 0.1 K/uL (ref 0–0.7)
Eosinophils Relative: 0 %
HCT: 35.8 % (ref 35.0–47.0)
Hemoglobin: 11.5 g/dL — ABNORMAL LOW (ref 12.0–16.0)
Lymphocytes Relative: 14 %
Lymphs Abs: 2.1 K/uL (ref 1.0–3.6)
MCH: 27.6 pg (ref 26.0–34.0)
MCHC: 32.2 g/dL (ref 32.0–36.0)
MCV: 85.9 fL (ref 80.0–100.0)
Monocytes Absolute: 1.5 K/uL — ABNORMAL HIGH (ref 0.2–0.9)
Monocytes Relative: 10 %
Neutro Abs: 11.1 K/uL — ABNORMAL HIGH (ref 1.4–6.5)
Neutrophils Relative %: 76 %
Platelets: 415 K/uL (ref 150–440)
RBC: 4.17 MIL/uL (ref 3.80–5.20)
RDW: 14.7 % — ABNORMAL HIGH (ref 11.5–14.5)
WBC: 14.7 K/uL — ABNORMAL HIGH (ref 3.6–11.0)

## 2014-10-22 LAB — GLUCOSE, POCT (MANUAL RESULT ENTRY): POC Glucose: 108 mg/dl — AB (ref 70–99)

## 2014-10-22 LAB — MAGNESIUM: Magnesium: 1.9 mg/dL (ref 1.7–2.4)

## 2014-10-22 LAB — LACTIC ACID, PLASMA: Lactic Acid, Venous: 1.9 mmol/L (ref 0.5–2.0)

## 2014-10-22 LAB — TROPONIN I: Troponin I: <0.03 ng/mL

## 2014-10-22 MED ORDER — ASPIRIN EC 81 MG PO TBEC
81.0000 mg | DELAYED_RELEASE_TABLET | Freq: Every day | ORAL | Status: DC
  Administered 2014-10-22 – 2014-10-25 (×4): 81 mg via ORAL
  Filled 2014-10-22 (×4): qty 1

## 2014-10-22 MED ORDER — ONDANSETRON HCL 4 MG/2ML IJ SOLN
INTRAMUSCULAR | Status: AC
  Administered 2014-10-22: 4 mg via INTRAVENOUS
  Filled 2014-10-22: qty 2

## 2014-10-22 MED ORDER — VANCOMYCIN HCL IN DEXTROSE 1-5 GM/200ML-% IV SOLN
1000.0000 mg | Freq: Once | INTRAVENOUS | Status: AC
  Administered 2014-10-22: 1000 mg via INTRAVENOUS
  Filled 2014-10-22: qty 200

## 2014-10-22 MED ORDER — SIMVASTATIN 40 MG PO TABS
40.0000 mg | ORAL_TABLET | Freq: Every evening | ORAL | Status: DC
  Administered 2014-10-22 – 2014-10-24 (×3): 40 mg via ORAL
  Filled 2014-10-22 (×3): qty 1

## 2014-10-22 MED ORDER — INSULIN ASPART 100 UNIT/ML ~~LOC~~ SOLN: 0.0000 [IU] | Freq: Three times a day (TID) | SUBCUTANEOUS | Status: DC

## 2014-10-22 MED ORDER — PIPERACILLIN-TAZOBACTAM 3.375 G IVPB
3.3750 g | Freq: Three times a day (TID) | INTRAVENOUS | Status: DC
  Administered 2014-10-22 – 2014-10-24 (×5): 3.375 g via INTRAVENOUS
  Filled 2014-10-22 (×9): qty 50

## 2014-10-22 MED ORDER — ONDANSETRON HCL 4 MG/2ML IJ SOLN
4.0000 mg | Freq: Four times a day (QID) | INTRAMUSCULAR | Status: DC | PRN
  Administered 2014-10-22: 4 mg via INTRAVENOUS

## 2014-10-22 MED ORDER — INSULIN ASPART 100 UNIT/ML ~~LOC~~ SOLN: 0.0000 [IU] | Freq: Every day | SUBCUTANEOUS | Status: DC

## 2014-10-22 MED ORDER — LORATADINE 10 MG PO TABS
10.0000 mg | ORAL_TABLET | Freq: Every day | ORAL | Status: DC
  Administered 2014-10-22 – 2014-10-25 (×4): 10 mg via ORAL
  Filled 2014-10-22 (×4): qty 1

## 2014-10-22 MED ORDER — ALBUTEROL SULFATE (2.5 MG/3ML) 0.083% IN NEBU: 2.5000 mg | INHALATION_SOLUTION | RESPIRATORY_TRACT | Status: DC | PRN

## 2014-10-22 MED ORDER — PIPERACILLIN-TAZOBACTAM 3.375 G IVPB 30 MIN
3.3750 g | Freq: Once | INTRAVENOUS | Status: AC
  Administered 2014-10-22: 3.375 g via INTRAVENOUS
  Filled 2014-10-22: qty 50

## 2014-10-22 MED ORDER — MAGNESIUM OXIDE 400 (241.3 MG) MG PO TABS
400.0000 mg | ORAL_TABLET | Freq: Every day | ORAL | Status: DC
  Administered 2014-10-22 – 2014-10-25 (×4): 400 mg via ORAL
  Filled 2014-10-22 (×4): qty 1

## 2014-10-22 MED ORDER — SODIUM CHLORIDE 0.9 % IV SOLN
INTRAVENOUS | Status: DC
  Administered 2014-10-22 – 2014-10-24 (×5): via INTRAVENOUS

## 2014-10-22 MED ORDER — ONDANSETRON HCL 4 MG PO TABS: 4.0000 mg | ORAL_TABLET | Freq: Four times a day (QID) | ORAL | Status: DC | PRN

## 2014-10-22 MED ORDER — HEPARIN SODIUM (PORCINE) 5000 UNIT/ML IJ SOLN
5000.0000 [IU] | Freq: Three times a day (TID) | INTRAMUSCULAR | Status: DC
  Administered 2014-10-22 – 2014-10-25 (×9): 5000 [IU] via SUBCUTANEOUS
  Filled 2014-10-22 (×9): qty 1

## 2014-10-22 MED ORDER — LEVOCETIRIZINE DIHYDROCHLORIDE 5 MG PO TABS: 5.0000 mg | ORAL_TABLET | Freq: Every day | ORAL | Status: DC

## 2014-10-22 MED ORDER — SODIUM CHLORIDE 0.9 % IV BOLUS (SEPSIS)
2000.0000 mL | Freq: Once | INTRAVENOUS | Status: AC
  Administered 2014-10-22: 2000 mL via INTRAVENOUS

## 2014-10-22 MED ORDER — ACETAMINOPHEN 325 MG PO TABS: 650.0000 mg | ORAL_TABLET | Freq: Four times a day (QID) | ORAL | Status: DC | PRN

## 2014-10-22 MED ORDER — ACETAMINOPHEN 650 MG RE SUPP: 650.0000 mg | Freq: Four times a day (QID) | RECTAL | Status: DC | PRN

## 2014-10-22 NOTE — ED Provider Notes (Signed)
[facility] Emergency Department Provider Note  ____________________________________________  Time seen: On arrival  I have reviewed the triage vital signs and the nursing notes.   HISTORY  Chief Complaint Loss of Consciousness    HPI Emily Pitts is a 59 y.o. female who presents with complaints of dizziness and syncope. She reports she feels diffusely weak. She has had periods of sweating. She was hospitalized earlier in the month for pyelonephritis reportedly. She denies fevers chills. No nausea. No chest pain. She just feels weak and dizzy. She reports she has lost nearly 30 pounds in the last month.     Past Medical History  Diagnosis Date  . Hypertension   . Hyperlipidemia   . Allergy   . Diabetes mellitus without complication 6314  . Vitamin D deficiency   . Numbness of feet   . Low back pain, episodic     Patient Active Problem List   Diagnosis Date Noted  . History of sepsis 10/10/2014  . Abnormal electrocardiogram 10/08/2014  . Nonspecific abnormal electromyogram (EMG) 10/08/2014  . Benign essential HTN 10/08/2014  . Diabetes mellitus with renal manifestation 10/08/2014  . Dyslipidemia 10/08/2014  . LBP (low back pain) 10/08/2014  . Gastro-esophageal reflux disease without esophagitis 10/08/2014  . Microalbuminuria 10/08/2014  . Disturbance of skin sensation 10/08/2014  . Obesity (BMI 30-39.9) 10/08/2014  . Perennial allergic rhinitis with seasonal variation 10/08/2014  . Plantar fasciitis 10/08/2014  . Postablative ovarian failure 10/08/2014  . Feeling stressed out 10/08/2014  . Menopausal symptom 10/08/2014  . Vitamin D deficiency 10/08/2014  . Encounter for screening colonoscopy 05/15/2013    Past Surgical History  Procedure Laterality Date  . Appendectomy  1966  . Abdominal hysterectomy  2010  . Dilation and curettage of uterus      Current Outpatient Rx  Name  Route  Sig  Dispense  Refill  . acetaminophen (TYLENOL)  325 MG tablet   Oral   Take 2 tablets (650 mg total) by mouth every 6 (six) hours as needed for mild pain (or Fever >/= 101).         Marland Kitchen aspirin EC 81 MG tablet   Oral   Take 81 mg by mouth daily.         . ciprofloxacin (CIPRO) 500 MG tablet   Oral   Take 1 tablet (500 mg total) by mouth 2 (two) times daily.   20 tablet   0   . levocetirizine (XYZAL) 5 MG tablet   Oral   Take 5 mg by mouth daily.          . magnesium oxide (MAG-OX) 400 MG tablet   Oral   Take 400 mg by mouth daily.         . Multiple Vitamins-Minerals (MULTIVITAMIN WITH MINERALS) tablet   Oral   Take 1 tablet by mouth daily.         . Olmesartan-Amlodipine-HCTZ (TRIBENZOR) 40-5-25 MG TABS   Oral   Take 1 tablet by mouth daily.         . simvastatin (ZOCOR) 40 MG tablet   Oral   Take 1 tablet by mouth every evening.         . vitamin B-12 (CYANOCOBALAMIN) 1000 MCG tablet   Oral   Take 1,000 mcg by mouth daily.         . vitamin C (ASCORBIC ACID) 500 MG tablet   Oral   Take 500 mg by mouth daily.  Allergies Review of patient's allergies indicates no known allergies.  Family History  Problem Relation Age of Onset  . Diabetes Mother   . COPD Mother   . Diabetes Father   . Hypertension Father   . Cancer Father     Kidney and Prostate  . CAD Father   . Diabetes Brother     Oldest Brother    Social History Social History  Substance Use Topics  . Smoking status: Never Smoker   . Smokeless tobacco: Never Used  . Alcohol Use: No    Review of Systems  Constitutional: Negative for fever. Eyes: Negative for visual changes. ENT: Negative for sore throat Cardiovascular: Negative for chest pain. Respiratory: Negative for shortness of breath. Gastrointestinal: Negative for abdominal pain, vomiting and diarrhea. Genitourinary: Negative for dysuria. Musculoskeletal: Negative for back pain. Skin: Negative for rash. Neurological: Negative for headaches or focal  weakness. Positive for dizziness Psychiatric: No anxiety    ____________________________________________   PHYSICAL EXAM:  VITAL SIGNS: ED Triage Vitals  Enc Vitals Group     BP 10/22/14 1308 101/53 mmHg     Pulse Rate 10/22/14 1308 85     Resp 10/22/14 1308 27     Temp 10/22/14 1308 97.6 F (36.4 C)     Temp Source 10/22/14 1308 Oral     SpO2 10/22/14 1308 100 %     Weight 10/22/14 1308 208 lb (94.348 kg)     Height 10/22/14 1308 5\' 9"  (1.753 m)     Head Cir --      Peak Flow --      Pain Score --      Pain Loc --      Pain Edu? --      Excl. in [place]? --      Constitutional: Alert and oriented. Ill-appearing, diaphoretic Eyes: Conjunctivae are normal.  ENT   Head: Normocephalic and atraumatic.   Mouth/Throat: Mucous membranes are moist. Cardiovascular: Normal rate, regular rhythm. Normal and symmetric distal pulses are present in all extremities. No murmurs, rubs, or gallops. Respiratory: Normal respiratory effort without tachypnea nor retractions. Breath sounds are clear and equal bilaterally.  Gastrointestinal: Soft and non-tender in all quadrants. No distention. There is no CVA tenderness. Genitourinary: deferred Musculoskeletal: Nontender with normal range of motion in all extremities. No lower extremity tenderness nor edema. Neurologic:  Normal speech and language. No gross focal neurologic deficits are appreciated. Skin:  Skin is warm, dry and intact. No rash noted. Psychiatric: Mood and affect are normal. Patient exhibits appropriate insight and judgment.  ____________________________________________    LABS (pertinent positives/negatives)  Labs Reviewed  COMPREHENSIVE METABOLIC PANEL - Abnormal; Notable for the following:    Sodium 132 (*)    Chloride 96 (*)    Glucose, Bld 104 (*)    BUN 29 (*)    Creatinine, Ser 2.21 (*)    Total Protein 8.7 (*)    Alkaline Phosphatase 129 (*)    GFR calc non Af Amer 23 (*)    GFR calc Af Amer 27 (*)    All  other components within normal limits  CBC WITH DIFFERENTIAL/PLATELET - Abnormal; Notable for the following:    WBC 14.7 (*)    Hemoglobin 11.5 (*)    RDW 14.7 (*)    Neutro Abs 11.1 (*)    Monocytes Absolute 1.5 (*)    All other components within normal limits  CULTURE, BLOOD (ROUTINE X 2)  CULTURE, BLOOD (ROUTINE X 2)  URINE CULTURE  TROPONIN I  GLUCOSE, CAPILLARY  URINALYSIS COMPLETEWITH MICROSCOPIC ([facility] ONLY)  LACTIC ACID, PLASMA    ____________________________________________   EKG  ED ECG REPORT I, Lavonia Drafts, the attending physician, personally viewed and interpreted this ECG.  Date: 10/22/2014 EKG Time: 1:03 PM Rate: 82 Rhythm: normal sinus rhythm QRS Axis: normal Intervals: normal ST/T Wave abnormalities: normal Conduction Disutrbances: none Narrative Interpretation: unremarkable   ____________________________________________    RADIOLOGY I have personally reviewed any xrays that were ordered on this patient: Chest x-ray unremarkable  ____________________________________________   PROCEDURES  Procedure(s) performed: none  Critical Care performed: yes  CRITICAL CARE Performed by: Lavonia Drafts   Total critical care time: 30  Critical care time was exclusive of separately billable procedures and treating other patients.  Critical care was necessary to treat or prevent imminent or life-threatening deterioration.  Critical care was time spent personally by me on the following activities: development of treatment plan with patient and/or surrogate as well as nursing, discussions with consultants, evaluation of patient's response to treatment, examination of patient, obtaining history from patient or surrogate, ordering and performing treatments and interventions, ordering and review of laboratory studies, ordering and review of radiographic studies, pulse oximetry and re-evaluation of patient's  condition.   ____________________________________________   INITIAL IMPRESSION / ASSESSMENT AND PLAN / ED COURSE  Pertinent labs & imaging results that were available during my care of the patient were reviewed by me and considered in my medical decision making (see chart for details).  Patient presents hypotensive and diaphoretic. 2 IVs placed normal saline resuscitation started. Code sepsis, given recent history of pyelonephritis which required hospitalization. Neck moist and Zosyn given empirically. She does have a high white blood cell count. We're unable to get urine as patient is very dry and dehydrated. It may be that her acute renal failure has caused potentiation of her blood pressure medications although sepsis is still on the differential. I will admit her to the hospital  ____________________________________________   FINAL CLINICAL IMPRESSION(S) / ED DIAGNOSES  Final diagnoses:  Acute renal failure, unspecified acute renal failure type  SIRS (systemic inflammatory response syndrome)     Lavonia Drafts, MD 10/22/14 [phone]

## 2014-10-22 NOTE — Addendum Note (Signed)
Addended by: Inda Coke on: 10/22/2014 01:32 PM   Modules accepted: Orders

## 2014-10-22 NOTE — ED Notes (Signed)
Waiting on abx (pharmacy fixing pyxis, requested them to send).  Ok to start without urine per dr Corky Downs

## 2014-10-22 NOTE — Progress Notes (Signed)
Name: Emily Pitts   MRN: [id number]    DOB: December 10, 1955   Date:10/22/2014       Progress Note  Subjective  Chief Complaint  Chief Complaint  Patient presents with  . Hospitalization Follow-up    Patient went in for Kidney Infection-stayed for 2 and half days.  . Fatigue    Patient states this morning from feeling so weak she fell twice, has no energy and feels out of it. Patient has losted 33 pounds, since last visit 10 days ago.  Marland Kitchen Urinary Tract Infection    Patient states she felt the symptoms came back Saturday with low back pain, so she started back on antibiotic Dr. Ancil Boozer previously prescribed.  . Cough    Dry Cough, started when patient left the hospital on Friday 10/11/04. Patient is experiencing SOB.    HPI  She was admitted to [facility] on August 3rd, 2016, with back pain and fever, diagnosed with pyelonephritis and urosepsis. She was discharged 10 days ago and is getting worse, cough , SOB, dizziness, two falls this morning, diarrhea, nausea, feeling very weak, one episode of syncope  this am. She lives alone.   Patient Active Problem List   Diagnosis Date Noted  . History of sepsis 10/10/2014  . Abnormal electrocardiogram 10/08/2014  . Nonspecific abnormal electromyogram (EMG) 10/08/2014  . Benign essential HTN 10/08/2014  . Diabetes mellitus with renal manifestation 10/08/2014  . Dyslipidemia 10/08/2014  . LBP (low back pain) 10/08/2014  . Gastro-esophageal reflux disease without esophagitis 10/08/2014  . Microalbuminuria 10/08/2014  . Disturbance of skin sensation 10/08/2014  . Obesity (BMI 30-39.9) 10/08/2014  . Perennial allergic rhinitis with seasonal variation 10/08/2014  . Plantar fasciitis 10/08/2014  . Postablative ovarian failure 10/08/2014  . Feeling stressed out 10/08/2014  . Menopausal symptom 10/08/2014  . Vitamin D deficiency 10/08/2014  . Encounter for screening colonoscopy 05/15/2013    Past Surgical History  Procedure Laterality Date  .  Appendectomy  1966  . Abdominal hysterectomy  2010  . Dilation and curettage of uterus      Family History  Problem Relation Age of Onset  . Diabetes Mother   . COPD Mother   . Diabetes Father   . Hypertension Father   . Cancer Father     Kidney and Prostate  . CAD Father   . Diabetes Brother     Oldest Brother    Social History   Social History  . Marital Status: Single    Spouse Name: N/A  . Number of Children: N/A  . Years of Education: N/A   Occupational History  . Not on file.   Social History Main Topics  . Smoking status: Never Smoker   . Smokeless tobacco: Never Used  . Alcohol Use: No  . Drug Use: No  . Sexual Activity: Not on file   Other Topics Concern  . Not on file   Social History Narrative     Current outpatient prescriptions:  .  acetaminophen (TYLENOL) 325 MG tablet, Take 2 tablets (650 mg total) by mouth every 6 (six) hours as needed for mild pain (or Fever >/= 101)., Disp: , Rfl:  .  aspirin 81 MG tablet, Take 81 mg by mouth daily., Disp: , Rfl:  .  ciprofloxacin (CIPRO) 500 MG tablet, Take 1 tablet (500 mg total) by mouth 2 (two) times daily., Disp: 20 tablet, Rfl: 0 .  levocetirizine (XYZAL) 5 MG tablet, Take 5 mg by mouth daily. , Disp: , Rfl:  .  magnesium oxide (MAG-OX) 400 MG tablet, Take 400 mg by mouth daily., Disp: , Rfl:  .  Multiple Vitamins-Minerals (MULTIVITAMIN WITH MINERALS) tablet, Take 1 tablet by mouth daily., Disp: , Rfl:  .  Olmesartan-Amlodipine-HCTZ (TRIBENZOR) 40-5-25 MG TABS, Take 1 tablet by mouth daily., Disp: , Rfl:  .  simvastatin (ZOCOR) 40 MG tablet, Take 1 tablet by mouth every evening., Disp: , Rfl:  .  vitamin B-12 (CYANOCOBALAMIN) 1000 MCG tablet, Take 1,000 mcg by mouth daily., Disp: , Rfl:  .  vitamin C (ASCORBIC ACID) 500 MG tablet, Take 500 mg by mouth daily., Disp: , Rfl:   No Known Allergies   ROS  Constitutional: Negative for fever positive for weight change.  Respiratory: Positive for cough and  shortness of breath.   Cardiovascular: Negative for chest pain or palpitations.  Gastrointestinal: Negative for abdominal pain. Positive for nausea and watery stools yesterday Musculoskeletal: Negative for gait problem or joint swelling.  Skin: Negative for rash.  Neurological: Positive  for dizziness negative for  headache.  No other specific complaints in a complete review of systems (except as listed in HPI above).  Objective  Filed Vitals:   10/22/14 1137  BP: 80/36  Pulse: 100  Temp: 97.6 F (36.4 C)  TempSrc: Oral  Resp: 22  Height: _0  (1.753 m)  Weight: 208 lb 11.2 oz (94.666 kg)  SpO2: 98%    Body mass index is 30.81 kg/(m^2).  Physical Exam Constitutional: Patient appears well-developed and well-nourished. Obese, in mild respiratory distress , she looks pale HEENT: head atraumatic, normocephalic, pupils equal and reactive to light,  neck supple, throat within normal limits Cardiovascular: Normal rate, regular rhythm and normal heart sounds.  No murmur heard. No BLE edema. Negative for calf tenderness Pulmonary/Chest: Effort normal and breath sounds normal. Mild respiratory distress. Abdominal: Soft.  There is tenderness on right upper quadrant Psychiatric: Patient behavior is normal. Judgment and thought content normal.   Recent Results (from the past 2160 hour(s))  POCT urinalysis dipstick     Status: Abnormal   Collection Time: 10/08/14  2:56 PM  Result Value Ref Range   Color, UA ORANGISH RED    Clarity, UA DARK    Glucose, UA NEGATIVE    Bilirubin, UA NEGATIVE    Ketones, UA NEGATIVE    Spec Grav, UA >=1.030    Blood, UA NEGATIVE    pH, UA 6.0    Protein, UA TRACE    Urobilinogen, UA 1.0    Nitrite, UA NEGATIVE    Leukocytes, UA small (1+) (A) Negative  Urine culture     Status: None   Collection Time: 10/09/14 12:00 AM  Result Value Ref Range   Urine Culture, Routine Final report    Result 1 Comment     Comment: Mixed urogenital flora Less than  10,000 colonies/mL   CBC     Status: Abnormal   Collection Time: 10/10/14  3:51 AM  Result Value Ref Range   WBC 13.5 (H) 3.6 - 11.0 K/uL   RBC 3.83 3.80 - 5.20 MIL/uL   Hemoglobin 10.8 (L) 12.0 - 16.0 g/dL   HCT 32.2 (L) 35.0 - 47.0 %   MCV 84.1 80.0 - 100.0 fL   MCH 28.2 26.0 - 34.0 pg   MCHC 33.5 32.0 - 36.0 g/dL   RDW 14.4 11.5 - 14.5 %   Platelets 181 150 - 440 K/uL  Comprehensive metabolic panel     Status: Abnormal   Collection Time: 10/10/14  3:51 AM  Result Value Ref Range   Sodium 134 (L) 135 - 145 mmol/L   Potassium 3.3 (L) 3.5 - 5.1 mmol/L   Chloride 97 (L) 101 - 111 mmol/L   CO2 22 22 - 32 mmol/L   Glucose, Bld 180 (H) 65 - 99 mg/dL   BUN 36 (H) 6 - 20 mg/dL   Creatinine, Ser 2.35 (H) 0.44 - 1.00 mg/dL   Calcium 8.3 (L) 8.9 - 10.3 mg/dL   Total Protein 7.1 6.5 - 8.1 g/dL   Albumin 2.9 (L) 3.5 - 5.0 g/dL   AST 97 (H) 15 - 41 U/L   ALT 95 (H) 14 - 54 U/L   Alkaline Phosphatase 134 (H) 38 - 126 U/L   Total Bilirubin 2.7 (H) 0.3 - 1.2 mg/dL   GFR calc non Af Amer 21 (L) >60 mL/min   GFR calc Af Amer 25 (L) >60 mL/min    Comment: (NOTE) The eGFR has been calculated using the CKD EPI equation. This calculation has not been validated in all clinical situations. eGFR's persistently <60 mL/min signify possible Chronic Kidney Disease.    Anion gap 15 5 - 15  Blood culture (routine x 2)     Status: None   Collection Time: 10/10/14  3:51 AM  Result Value Ref Range   Specimen Description BLOOD LEFT ASSIST CONTROL    Special Requests BOTTLES DRAWN AEROBIC AND ANAEROBIC 1CC    Culture  Setup Time      GRAM POSITIVE COCCI ANAEROBIC BOTTLE ONLY CRITICAL RESULT CALLED TO, READ BACK BY AND VERIFIED WITH: ALISHA RAWLINS AT 8416 10/14/14.PMH CONFIRMED BY RW    Culture      VIRIDANS STREPTOCOCCUS ANAEROBIC BOTTLE ONLY POSSIBLE CONTAMINATION WITH SKIN FLORA    Report Status 10/15/2014 FINAL   Blood culture (routine x 2)     Status: None   Collection Time: 10/10/14  3:51  AM  Result Value Ref Range   Specimen Description BLOOD LEFT HAND    Special Requests BOTTLES DRAWN AEROBIC AND ANAEROBIC 1CC    Culture NO GROWTH 5 DAYS    Report Status 10/15/2014 FINAL   TSH     Status: None   Collection Time: 10/10/14  3:51 AM  Result Value Ref Range   TSH 1.289 0.350 - 4.500 uIU/mL  Hemoglobin A1c     Status: Abnormal   Collection Time: 10/10/14  3:51 AM  Result Value Ref Range   Hgb A1c MFr Bld 7.4 (H) 4.0 - 6.0 %  Lactic acid, plasma     Status: None   Collection Time: 10/10/14  4:09 AM  Result Value Ref Range   Lactic Acid, Venous 1.6 0.5 - 2.0 mmol/L  Urinalysis complete, with microscopic ([facility] only)     Status: Abnormal   Collection Time: 10/10/14  7:16 AM  Result Value Ref Range   Color, Urine AMBER (A) YELLOW   APPearance CLOUDY (A) CLEAR   Glucose, UA NEGATIVE NEGATIVE mg/dL   Bilirubin Urine NEGATIVE NEGATIVE   Ketones, ur NEGATIVE NEGATIVE mg/dL   Specific Gravity, Urine 1.013 1.005 - 1.030   Hgb urine dipstick 1+ (A) NEGATIVE   pH 5.0 5.0 - 8.0   Protein, ur 30 (A) NEGATIVE mg/dL   Nitrite NEGATIVE NEGATIVE   Leukocytes, UA NEGATIVE NEGATIVE   RBC / HPF 0-5 0 - 5 RBC/hpf   WBC, UA 6-30 0 - 5 WBC/hpf   Bacteria, UA RARE (A) NONE SEEN   Squamous Epithelial / LPF 0-5 (A)  NONE SEEN   Mucous PRESENT    Hyaline Casts, UA PRESENT    Granular Casts, UA PRESENT    Amorphous Crystal PRESENT   Urine culture     Status: None   Collection Time: 10/10/14  7:16 AM  Result Value Ref Range   Specimen Description URINE, CLEAN CATCH    Special Requests NONE    Culture NO GROWTH 2 DAYS    Report Status 10/12/2014 FINAL   Lactic acid, plasma     Status: None   Collection Time: 10/10/14  8:34 AM  Result Value Ref Range   Lactic Acid, Venous 1.2 0.5 - 2.0 mmol/L  Basic metabolic panel     Status: Abnormal   Collection Time: 10/11/14  4:20 AM  Result Value Ref Range   Sodium 136 135 - 145 mmol/L   Potassium 3.1 (L) 3.5 - 5.1 mmol/L   Chloride 105 101 -  111 mmol/L   CO2 23 22 - 32 mmol/L   Glucose, Bld 177 (H) 65 - 99 mg/dL   BUN 24 (H) 6 - 20 mg/dL   Creatinine, Ser 1.26 (H) 0.44 - 1.00 mg/dL   Calcium 8.1 (L) 8.9 - 10.3 mg/dL   GFR calc non Af Amer 45 (L) >60 mL/min   GFR calc Af Amer 53 (L) >60 mL/min    Comment: (NOTE) The eGFR has been calculated using the CKD EPI equation. This calculation has not been validated in all clinical situations. eGFR's persistently <60 mL/min signify possible Chronic Kidney Disease.    Anion gap 8 5 - 15  CBC     Status: Abnormal   Collection Time: 10/11/14  4:20 AM  Result Value Ref Range   WBC 9.7 3.6 - 11.0 K/uL   RBC 3.67 (L) 3.80 - 5.20 MIL/uL   Hemoglobin 10.4 (L) 12.0 - 16.0 g/dL   HCT 31.3 (L) 35.0 - 47.0 %   MCV 85.3 80.0 - 100.0 fL   MCH 28.3 26.0 - 34.0 pg   MCHC 33.1 32.0 - 36.0 g/dL   RDW 14.7 (H) 11.5 - 14.5 %   Platelets 176 150 - 440 K/uL  Glucose, capillary     Status: Abnormal   Collection Time: 10/11/14  4:22 PM  Result Value Ref Range   Glucose-Capillary 156 (H) 65 - 99 mg/dL   Comment 1 Notify RN   Glucose, capillary     Status: Abnormal   Collection Time: 10/11/14  7:32 PM  Result Value Ref Range   Glucose-Capillary 188 (H) 65 - 99 mg/dL  Basic metabolic panel     Status: Abnormal   Collection Time: 10/12/14  4:44 AM  Result Value Ref Range   Sodium 140 135 - 145 mmol/L   Potassium 3.2 (L) 3.5 - 5.1 mmol/L   Chloride 103 101 - 111 mmol/L   CO2 29 22 - 32 mmol/L   Glucose, Bld 152 (H) 65 - 99 mg/dL   BUN 13 6 - 20 mg/dL   Creatinine, Ser 0.90 0.44 - 1.00 mg/dL   Calcium 8.6 (L) 8.9 - 10.3 mg/dL   GFR calc non Af Amer >60 >60 mL/min   GFR calc Af Amer >60 >60 mL/min    Comment: (NOTE) The eGFR has been calculated using the CKD EPI equation. This calculation has not been validated in all clinical situations. eGFR's persistently <60 mL/min signify possible Chronic Kidney Disease.    Anion gap 8 5 - 15  Magnesium     Status: None  Collection Time: 10/12/14   4:44 AM  Result Value Ref Range   Magnesium 2.0 1.7 - 2.4 mg/dL  CBC     Status: Abnormal   Collection Time: 10/12/14  4:44 AM  Result Value Ref Range   WBC 13.0 (H) 3.6 - 11.0 K/uL   RBC 3.64 (L) 3.80 - 5.20 MIL/uL   Hemoglobin 10.5 (L) 12.0 - 16.0 g/dL   HCT 30.9 (L) 35.0 - 47.0 %   MCV 84.9 80.0 - 100.0 fL   MCH 28.8 26.0 - 34.0 pg   MCHC 33.9 32.0 - 36.0 g/dL   RDW 14.6 (H) 11.5 - 14.5 %   Platelets 212 150 - 440 K/uL  Hemoglobin A1c     Status: Abnormal   Collection Time: 10/12/14  4:44 AM  Result Value Ref Range   Hgb A1c MFr Bld 6.9 (H) 4.0 - 6.0 %  Glucose, capillary     Status: Abnormal   Collection Time: 10/12/14  7:54 AM  Result Value Ref Range   Glucose-Capillary 175 (H) 65 - 99 mg/dL     PHQ2/9: Depression screen PHQ 2/9 10/08/2014  Decreased Interest 0  Down, Depressed, Hopeless 0  PHQ - 2 Score 0    Fall Risk: Fall Risk  10/08/2014  Falls in the past year? No      Assessment & Plan  1. Pyelonephritis Recently admitted to [facility] and denies dysuria or hematuria, no back pain , except for when she coughs  2. Dry cough Will need CXR, no calf tenderness  3. History of sepsis Just discharged from [facility] 10 days ago, but not feeling any better, feeling weak, able to eat, now with SOB, cough, nausea  4. Nausea Yesterday that improved with otc medication   5. Right upper quadrant pain During exam today  6. Weight loss Lost 24 lbs since hospital stay.   7. Hypotension, unspecified hypotension type It may be secondary to weight loss and taking same dose of Tribenzor, but also could be secondary to dehydration or getting septic again - even though HR and temperature are normal. May need IV fluids and adjust dose, we will call EMS for transportation to [facility]. Two falls at home today  8. Dyspnea  last couple of days, differential diagnosis PE/pneumonia   9. Syncope, unspecified syncope type Contact EMS patient will be transported by EMS to [facility]

## 2014-10-22 NOTE — ED Notes (Addendum)
Patient fell X 2 today with 1 episode syncope.  Has been feeling worse since Saturday but has been sick since urosepsis admit recently.  Has been weak and dizzy.  Hypotensive in 80s at pcp.  Has been [facility] and had dry cough.  Pt has lost 33lb in 10 days

## 2014-10-22 NOTE — ED Notes (Signed)
Pt became nausea and began vomiting. Pt given Zofran per PRN order.

## 2014-10-22 NOTE — H&P (Signed)
[facility] [place] NAME: Emily Pitts    MR#:  [id number]  DATE OF BIRTH:  08-Jul-1955  DATE OF ADMISSION:  10/22/2014  PRIMARY CARE PHYSICIAN: Loistine Chance, MD   REQUESTING/REFERRING PHYSICIAN: Lavonia Drafts, MD  CHIEF COMPLAINT:   Chief Complaint  Patient presents with  . Loss of Consciousness   Dizziness and syncope today HISTORY OF PRESENT ILLNESS:  Emily Pitts  is a 59 y.o. female with a known history of hypertension, diabetes, hyperlipidemia and recent UTI. The patient to present to the ED with complaints of dizziness and syncope. She reported that she has had the generalized weakness and poor oral intake. She also has a period of sweating. But if she denies any fever, chills, nausea or vomiting. She had watery diarrhea twice. The patient was recently diagnosed with the pyelonephritis, treated with IV Rocephin for 3 days in this hospital and discharged with Cipro. Urine culture was negative. She was found hypotensive at 80s in ED, was treated with the normal saline bolus and antibiotics for possible sepsis. Urinalysis is pending.  PAST MEDICAL HISTORY:   Past Medical History  Diagnosis Date  . Hypertension   . Hyperlipidemia   . Allergy   . Diabetes mellitus without complication 0263  . Vitamin D deficiency   . Numbness of feet   . Low back pain, episodic     PAST SURGICAL HISTORY:   Past Surgical History  Procedure Laterality Date  . Appendectomy  1966  . Abdominal hysterectomy  2010  . Dilation and curettage of uterus      SOCIAL HISTORY:   Social History  Substance Use Topics  . Smoking status: Never Smoker   . Smokeless tobacco: Never Used  . Alcohol Use: No    FAMILY HISTORY:   Family History  Problem Relation Age of Onset  . Diabetes Mother   . COPD Mother   . Diabetes Father   . Hypertension Father   . Cancer Father     Kidney and Prostate  . CAD Father   . Diabetes Brother     Oldest  Brother    DRUG ALLERGIES:  No Known Allergies  REVIEW OF SYSTEMS:  CONSTITUTIONAL: No fever, but has generalized weakness and poor oral intake.  EYES: No blurred or double vision.  EARS, NOSE, AND THROAT: No tinnitus or ear pain.  RESPIRATORY: No cough, shortness of breath, wheezing or hemoptysis.  CARDIOVASCULAR: No chest pain, orthopnea, edema.  GASTROINTESTINAL: No nausea, vomiting, diarrhea or abdominal pain.  GENITOURINARY: No dysuria, hematuria, or incontinence. ENDOCRINE: No polyuria, nocturia,  HEMATOLOGY: No anemia, easy bruising or bleeding SKIN: No rash or lesion. MUSCULOSKELETAL: No joint pain or arthritis.   NEUROLOGIC: No tingling, numbness, weakness.  PSYCHIATRY: No anxiety or depression.   MEDICATIONS AT HOME:   Prior to Admission medications   Medication Sig Start Date End Date Taking? Authorizing Provider  acetaminophen (TYLENOL) 325 MG tablet Take 2 tablets (650 mg total) by mouth every 6 (six) hours as needed for mild pain (or Fever >/= 101). 10/12/14  Yes Nicholes Mango, MD  aspirin EC 81 MG tablet Take 81 mg by mouth daily.   Yes Historical Provider, MD  ciprofloxacin (CIPRO) 500 MG tablet Take 1 tablet (500 mg total) by mouth 2 (two) times daily. 10/08/14  Yes Bobetta Lime, MD  levocetirizine (XYZAL) 5 MG tablet Take 5 mg by mouth daily.  02/18/13  Yes Historical Provider, MD  magnesium oxide (MAG-OX)  400 MG tablet Take 400 mg by mouth daily.   Yes Historical Provider, MD  Multiple Vitamins-Minerals (MULTIVITAMIN WITH MINERALS) tablet Take 1 tablet by mouth daily.   Yes Historical Provider, MD  Olmesartan-Amlodipine-HCTZ (TRIBENZOR) 40-5-25 MG TABS Take 1 tablet by mouth daily.   Yes Historical Provider, MD  simvastatin (ZOCOR) 40 MG tablet Take 1 tablet by mouth every evening. 08/25/14  Yes Historical Provider, MD  vitamin B-12 (CYANOCOBALAMIN) 1000 MCG tablet Take 1,000 mcg by mouth daily.   Yes Historical Provider, MD  vitamin C (ASCORBIC ACID) 500 MG tablet  Take 500 mg by mouth daily.   Yes Historical Provider, MD      VITAL SIGNS:  Blood pressure 103/61, pulse 91, temperature 97.6 F (36.4 C), temperature source Oral, resp. rate 22, height 5\' 9"  (1.753 m), weight 94.348 kg (208 lb), SpO2 100 %.  PHYSICAL EXAMINATION:  GENERAL:  59 y.o.-year-old patient lying in the bed with no acute distress. Obese. EYES: Pupils equal, round, reactive to light and accommodation. No scleral icterus. Extraocular muscles intact.  HEENT: Head atraumatic, normocephalic. Oropharynx and nasopharynx clear. Moist oral mucosa. NECK:  Supple, no jugular venous distention. No thyroid enlargement, no tenderness.  LUNGS: Normal breath sounds bilaterally, no wheezing, rales,rhonchi or crepitation. No use of accessory muscles of respiration.  CARDIOVASCULAR: S1, S2 normal. No murmurs, rubs, or gallops.  ABDOMEN: Soft, nontender, nondistended. Bowel sounds present. No organomegaly or mass.  EXTREMITIES: No pedal edema, cyanosis, or clubbing.  NEUROLOGIC: Cranial nerves II through XII are intact. Muscle strength 5/5 in all extremities. Sensation intact. Gait not checked.  PSYCHIATRIC: The patient is alert and oriented x 3.  SKIN: No obvious rash, lesion, or ulcer.   LABORATORY PANEL:   CBC  Recent Labs Lab 10/22/14 1310  WBC 14.7*  HGB 11.5*  HCT 35.8  PLT 415   ------------------------------------------------------------------------------------------------------------------  Chemistries   Recent Labs Lab 10/22/14 1310  NA 132*  K 4.1  CL 96*  CO2 23  GLUCOSE 104*  BUN 29*  CREATININE 2.21*  CALCIUM 9.2  AST 36  ALT 43  ALKPHOS 129*  BILITOT 1.0   ------------------------------------------------------------------------------------------------------------------  Cardiac Enzymes  Recent Labs Lab 10/22/14 1310  TROPONINI <0.03    ------------------------------------------------------------------------------------------------------------------  RADIOLOGY:  Dg Chest Portable 1 View  10/22/2014   CLINICAL DATA:  Syncope, dizziness, hypotension.  Fell 2 days ago.  EXAM: PORTABLE CHEST - 1 VIEW  COMPARISON:  08/09/2013  FINDINGS: The heart size and mediastinal contours are within normal limits. Both lungs are clear. The visualized skeletal structures are unremarkable.  IMPRESSION: No active disease.   Electronically Signed   By: Rolm Baptise M.D.   On: 10/22/2014 13:40    EKG:   Orders placed or performed during the hospital encounter of 10/22/14  . ED EKG  . ED EKG    IMPRESSION AND PLAN:   Acute renal failure Hypotension Hyponatremia Sepsis, possible UTI Hypertension Diabetes  The patient will be admitted to medical floor. I will continue IV fluid support, hold Tribenzor due to hypotension and renal failure, follow-up BMP. For sepsis with possible UTI, follow-up urine analysis, urine culture and blood culture, continue Zosyn. For diabetes, start sliding scale.  All the records are reviewed and case discussed with ED provider. Management plans discussed with the patient, her brother and they are in agreement.  CODE STATUS: Full code  TOTAL TIME TAKING CARE OF THIS PATIENT: 52 minutes.    Demetrios Loll M.D on 10/22/2014 at 3:21 PM  Between  7am to 6pm - Pager - [phone]  After 6pm go to [email or web address] - password [email or web address]  Office  [phone]  CC: Primary care physician; Loistine Chance, MD

## 2014-10-22 NOTE — Progress Notes (Signed)
ANTIBIOTIC CONSULT NOTE - INITIAL  Pharmacy Consult for Zosyn  Indication: Sepsis/UTI  No Known Allergies  Patient Measurements: Height: 5\' 9"  (175.3 cm) Weight: 208 lb (94.348 kg) IBW/kg (Calculated) : 66.2  Vital Signs: Temp: 98 F (36.7 C) (08/15 1651) Temp Source: Oral (08/15 1651) BP: 116/58 mmHg (08/15 1652) Pulse Rate: 82 (08/15 1651) Intake/Output from previous day:   Intake/Output from this shift:    Labs:  Recent Labs  10/22/14 1310  WBC 14.7*  HGB 11.5*  PLT 415  CREATININE 2.21*   Estimated Creatinine Clearance: 33.1 mL/min (by C-G formula based on Cr of 2.21). No results for input(s): VANCOTROUGH, VANCOPEAK, VANCORANDOM, GENTTROUGH, GENTPEAK, GENTRANDOM, TOBRATROUGH, TOBRAPEAK, TOBRARND, AMIKACINPEAK, AMIKACINTROU, AMIKACIN in the last 72 hours.   Microbiology: Recent Results (from the past 720 hour(s))  Urine culture     Status: None   Collection Time: 10/09/14 12:00 AM  Result Value Ref Range Status   Urine Culture, Routine Final report  Final   Result 1 Comment  Final    Comment: Mixed urogenital flora Less than 10,000 colonies/mL   Blood culture (routine x 2)     Status: None   Collection Time: 10/10/14  3:51 AM  Result Value Ref Range Status   Specimen Description BLOOD LEFT ASSIST CONTROL  Final   Special Requests BOTTLES DRAWN AEROBIC AND ANAEROBIC 1CC  Final   Culture  Setup Time   Final    GRAM POSITIVE COCCI ANAEROBIC BOTTLE ONLY CRITICAL RESULT CALLED TO, READ BACK BY AND VERIFIED WITH: ALISHA RAWLINS AT 1660 10/14/14.PMH CONFIRMED BY RW    Culture   Final    VIRIDANS STREPTOCOCCUS ANAEROBIC BOTTLE ONLY POSSIBLE CONTAMINATION WITH SKIN FLORA    Report Status 10/15/2014 FINAL  Final  Blood culture (routine x 2)     Status: None   Collection Time: 10/10/14  3:51 AM  Result Value Ref Range Status   Specimen Description BLOOD LEFT HAND  Final   Special Requests BOTTLES DRAWN AEROBIC AND ANAEROBIC 1CC  Final   Culture NO GROWTH 5  DAYS  Final   Report Status 10/15/2014 FINAL  Final  Urine culture     Status: None   Collection Time: 10/10/14  7:16 AM  Result Value Ref Range Status   Specimen Description URINE, CLEAN CATCH  Final   Special Requests NONE  Final   Culture NO GROWTH 2 DAYS  Final   Report Status 10/12/2014 FINAL  Final    Medical History: Past Medical History  Diagnosis Date  . Hypertension   . Hyperlipidemia   . Allergy   . Diabetes mellitus without complication 6301  . Vitamin D deficiency   . Numbness of feet   . Low back pain, episodic     Medications:  Scheduled:  . aspirin EC  81 mg Oral Daily  . heparin  5,000 Units Subcutaneous 3 times per day  . insulin aspart  0-5 Units Subcutaneous QHS  . insulin aspart  0-9 Units Subcutaneous TID WC  . levocetirizine  5 mg Oral Daily  . magnesium oxide  400 mg Oral Daily  . piperacillin-tazobactam (ZOSYN)  IV  3.375 g Intravenous 3 times per day  . simvastatin  40 mg Oral QPM   Infusions:  . sodium chloride     PRN: acetaminophen **OR** acetaminophen, albuterol, ondansetron **OR** ondansetron (ZOFRAN) IV  Assessment: 59 y/o F admitted with acute renal failure ordered empiric abx for sepsis and possible  UTI.  Goal of Therapy:  Resolution  of Infection  Plan:  Zosyn 3.375 g iv once in ED then 3.375 g EI q 8 hours. Will f/u renal function and culture results.   Ulice Dash D 10/22/2014,5:02 PM

## 2014-10-22 NOTE — ED Notes (Signed)
MD aware unable to obtain urine via in and out cath.

## 2014-10-23 DIAGNOSIS — E43 Unspecified severe protein-calorie malnutrition: Secondary | ICD-10-CM | POA: Insufficient documentation

## 2014-10-23 LAB — CBC
HEMATOCRIT: 34.1 % — AB (ref 35.0–47.0)
HEMOGLOBIN: 11.2 g/dL — AB (ref 12.0–16.0)
MCH: 28.4 pg (ref 26.0–34.0)
MCHC: 33 g/dL (ref 32.0–36.0)
MCV: 86.1 fL (ref 80.0–100.0)
Platelets: 374 10*3/uL (ref 150–440)
RBC: 3.96 MIL/uL (ref 3.80–5.20)
RDW: 14.9 % — ABNORMAL HIGH (ref 11.5–14.5)
WBC: 8.6 10*3/uL (ref 3.6–11.0)

## 2014-10-23 LAB — BASIC METABOLIC PANEL
ANION GAP: 7 (ref 5–15)
BUN: 20 mg/dL (ref 6–20)
CALCIUM: 8.9 mg/dL (ref 8.9–10.3)
CHLORIDE: 106 mmol/L (ref 101–111)
CO2: 26 mmol/L (ref 22–32)
Creatinine, Ser: 1.39 mg/dL — ABNORMAL HIGH (ref 0.44–1.00)
GFR calc non Af Amer: 40 mL/min — ABNORMAL LOW (ref >60)
GFR, EST AFRICAN AMERICAN: 47 mL/min — AB (ref >60)
Glucose, Bld: 123 mg/dL — ABNORMAL HIGH (ref 65–99)
POTASSIUM: 4.3 mmol/L (ref 3.5–5.1)
Sodium: 139 mmol/L (ref 135–145)

## 2014-10-23 LAB — GLUCOSE, CAPILLARY
GLUCOSE-CAPILLARY: 116 mg/dL — AB (ref 65–99)
GLUCOSE-CAPILLARY: 76 mg/dL (ref 65–99)
GLUCOSE-CAPILLARY: 101 mg/dL — AB (ref 65–99)
GLUCOSE-CAPILLARY: 105 mg/dL — AB (ref 65–99)

## 2014-10-23 NOTE — Progress Notes (Signed)
Initial Nutrition Assessment  DOCUMENTATION CODES:   Severe malnutrition in context of acute illness/injury  INTERVENTION:  Meals and snacks: Cater to pt preferences Nutrition Supplement Therapy: Will add No sugar added mightyshake BID for added nutrition   NUTRITION DIAGNOSIS:   Inadequate oral intake related to acute illness as evidenced by per patient/family report.    GOAL:   Patient will meet greater than or equal to 90% of their needs    MONITOR:    (Energy intake, Electrolyte and renal profile, )  REASON FOR ASSESSMENT:   Malnutrition Screening Tool    ASSESSMENT:      Pt admitted with ARF, sepsis, UTI, dizziness and syncope prior to admission  Past Medical History  Diagnosis Date  . Hypertension   . Hyperlipidemia   . Allergy   . Diabetes mellitus without complication 1505  . Vitamin D deficiency   . Numbness of feet   . Low back pain, episodic     Current Nutrition: ate 100% of breakfast this am and tolerated well  Food/Nutrition-Related History: Pt reports for the past 2 weeks decreased intake, eating 50% or less of normal intake   Medications: NS at 157ml/hr, aspart, Mgox  Electrolyte/Renal Profile and Glucose Profile:   Recent Labs Lab 10/22/14 1310 10/22/14 1709 10/23/14 0507  NA 132*  --  139  K 4.1  --  4.3  CL 96*  --  106  CO2 23  --  26  BUN 29*  --  20  CREATININE 2.21*  --  1.39*  CALCIUM 9.2  --  8.9  MG  --  1.9  --   GLUCOSE 104*  --  123*   Protein Profile:  Recent Labs Lab 10/22/14 1310  ALBUMIN 3.6     Last BM:8/16   Nutrition-Focused Physical Exam Findings: Nutrition-Focused physical exam completed. Findings are WDL for fat depletion, muscle depletion, and edema.     Weight Change: Pt reports wt loss of 13% in the last 3 months, wt encounters reviewed    Diet Order:  Diet heart healthy/carb modified Room service appropriate?: Yes; Fluid consistency:: Thin  Skin:   reviewed   Height:   Ht  Readings from Last 1 Encounters:  10/22/14 5\' 9"  (1.753 m)    Weight:   Wt Readings from Last 1 Encounters:  10/22/14 208 lb (94.348 kg)     BMI:  Body mass index is 30.7 kg/(m^2).  Estimated Nutritional Needs:   Kcal:  BEE 1294 kcals (IF 1.0-1.2, AF 1.3) 6979-4801 kcals/d  Protein:  (1.0-1.2 g/kg) 66-79 g/d  Fluid:  (30-38ml/kg) 1980-2339ml/d  EDUCATION NEEDS:   No education needs identified at this time  HIGH Care Level  Emily Pitts, [place], [place] (pager)

## 2014-10-23 NOTE — Progress Notes (Signed)
[place] at [facility]                                                                                                                                                                                            Patient Demographics   Emily Pitts, is a 59 y.o. female, DOB - 1955/04/25, NWG:[id number]  Admit date - 10/22/2014   Admitting Physician Demetrios Loll, MD  Outpatient Primary MD for the patient is Loistine Chance, MD   LOS - 1  Subjective: Patient feels better denies any fevers or chills no chest pain or shortness of breath     Review of Systems:   CONSTITUTIONAL: No documented fever. No fatigue, weakness. No weight gain, no weight loss.  EYES: No blurry or double vision.  ENT: No tinnitus. No postnasal drip. No redness of the oropharynx.  RESPIRATORY: No cough, no wheeze, no hemoptysis. No dyspnea.  CARDIOVASCULAR: No chest pain. No orthopnea. No palpitations. No syncope.  GASTROINTESTINAL: No nausea, no vomiting or diarrhea. No abdominal pain. No melena or hematochezia.  GENITOURINARY: No dysuria or hematuria.  ENDOCRINE: No polyuria or nocturia. No heat or cold intolerance.  HEMATOLOGY: No anemia. No bruising. No bleeding.  INTEGUMENTARY: No rashes. No lesions.  MUSCULOSKELETAL: No arthritis. No swelling. No gout.  NEUROLOGIC: No numbness, tingling, or ataxia. No seizure-type activity.  PSYCHIATRIC: No anxiety. No insomnia. No ADD.    Vitals:   Filed Vitals:   10/22/14 1652 10/23/14 0006 10/23/14 0801 10/23/14 0802  BP: 116/58 113/48 102/53   Pulse:  86  73  Temp:  98.1 F (36.7 C) 99 F (37.2 C)   TempSrc:  Oral Oral   Resp:   17   Height:      Weight:      SpO2:  95%  98%    Wt Readings from Last 3 Encounters:  10/22/14 94.348 kg (208 lb)  10/22/14 94.666 kg (208 lb 11.2 oz)  07/31/14 109.09 kg (240 lb 8 oz)     Intake/Output Summary (Last 24 hours) at 10/23/14 1419 Last data filed at 10/23/14 1203  Gross per 24  hour  Intake   2589 ml  Output   5250 ml  Net  -2661 ml    Physical Exam:   GENERAL: Pleasant-appearing in no apparent distress.  HEAD, EYES, EARS, NOSE AND THROAT: Atraumatic, normocephalic. Extraocular muscles are intact. Pupils equal and reactive to light. Sclerae anicteric. No conjunctival injection. No oro-pharyngeal erythema.  NECK: Supple. There is no jugular venous distention. No bruits, no lymphadenopathy, no thyromegaly.  HEART: Regular rate and rhythm,. No murmurs, no rubs,  no clicks.  LUNGS: Clear to auscultation bilaterally. No rales or rhonchi. No wheezes.  ABDOMEN: Soft, flat, nontender, nondistended. Has good bowel sounds. No hepatosplenomegaly appreciated.  EXTREMITIES: No evidence of any cyanosis, clubbing, or peripheral edema.  +2 pedal and radial pulses bilaterally.  NEUROLOGIC: The patient is alert, awake, and oriented x3 with no focal motor or sensory deficits appreciated bilaterally.  SKIN: Moist and warm with no rashes appreciated.  Psych: Not anxious, depressed LN: No inguinal LN enlargement    Antibiotics   Anti-infectives    Start     Dose/Rate Route Frequency Ordered Stop   10/22/14 2300  piperacillin-tazobactam (ZOSYN) IVPB 3.375 g     3.375 g 12.5 mL/hr over 240 Minutes Intravenous 3 times per day 10/22/14 1659     10/22/14 1430  vancomycin (VANCOCIN) IVPB 1000 mg/200 mL premix     1,000 mg 200 mL/hr over 60 Minutes Intravenous  Once 10/22/14 1423 10/22/14 1614   10/22/14 1430  piperacillin-tazobactam (ZOSYN) IVPB 3.375 g     3.375 g 100 mL/hr over 30 Minutes Intravenous  Once 10/22/14 1423 10/22/14 1514      Medications   Scheduled Meds: . aspirin EC  81 mg Oral Daily  . heparin  5,000 Units Subcutaneous 3 times per day  . insulin aspart  0-5 Units Subcutaneous QHS  . insulin aspart  0-9 Units Subcutaneous TID WC  . loratadine  10 mg Oral Daily  . magnesium oxide  400 mg Oral Daily  . piperacillin-tazobactam (ZOSYN)  IV  3.375 g Intravenous  3 times per day  . simvastatin  40 mg Oral QPM   Continuous Infusions: . sodium chloride 100 mL/hr at 10/23/14 0838   PRN Meds:.acetaminophen **OR** acetaminophen, albuterol, ondansetron **OR** ondansetron (ZOFRAN) IV   Data Review:   Micro Results Recent Results (from the past 240 hour(s))  Culture, blood (routine x 2)     Status: None (Preliminary result)   Collection Time: 10/22/14  1:10 PM  Result Value Ref Range Status   Specimen Description BLOOD LEFT ARM  Final   Special Requests BOTTLES DRAWN AEROBIC AND ANAEROBIC 3CC  Final   Culture NO GROWTH < 24 HOURS  Final   Report Status PENDING  Incomplete  Culture, blood (routine x 2)     Status: None (Preliminary result)   Collection Time: 10/22/14  1:10 PM  Result Value Ref Range Status   Specimen Description BLOOD RIGHT HAND  Final   Special Requests BOTTLES DRAWN AEROBIC AND ANAEROBIC  1CC  Final   Culture NO GROWTH < 24 HOURS  Final   Report Status PENDING  Incomplete    Radiology Reports US Renal  10/10/2014   CLINICAL DATA:  Acute kidney injury  EXAM: RENAL / URINARY TRACT ULTRASOUND COMPLETE  COMPARISON:  CT abdomen pelvis 10/10/2014  FINDINGS: Right Kidney:  Length: 11.7 cm. Echogenicity within normal limits. No mass or hydronephrosis visualized.  Left Kidney:  Length: 12.1 cm. Echogenicity within normal limits. No mass or hydronephrosis visualized.  Bladder:  Urinate bladder not visualized and empty. The patient voided prior to the study.  IMPRESSION: Negative   Electronically Signed   By: Franchot Gallo M.D.   On: 10/10/2014 10:54   Dg Chest Portable 1 View  10/22/2014   CLINICAL DATA:  Syncope, dizziness, hypotension.  Fell 2 days ago.  EXAM: PORTABLE CHEST - 1 VIEW  COMPARISON:  08/09/2013  FINDINGS: The heart size and mediastinal contours are within normal limits. Both lungs are clear.  The visualized skeletal structures are unremarkable.  IMPRESSION: No active disease.   Electronically Signed   By: Rolm Baptise M.D.    On: 10/22/2014 13:40   Ct Renal Stone Study  10/10/2014   CLINICAL DATA:  Fever and low back pain.  EXAM: CT ABDOMEN AND PELVIS WITHOUT CONTRAST  TECHNIQUE: Multidetector CT imaging of the abdomen and pelvis was performed following the standard protocol without IV contrast.  COMPARISON:  None.  FINDINGS: There is no urinary calculus. There is no hydronephrosis or ureteral dilatation. There are unremarkable unenhanced appearances of the liver, spleen, pancreas, adrenals and kidneys. The abdominal aorta is normal in caliber. There is no atherosclerotic calcification. There is no adenopathy in the abdomen or pelvis. There is a small hiatal hernia. The small bowel is unremarkable. There is moderate colonic diverticulosis without evidence of diverticulitis or other acute inflammatory process. There is hysterectomy. No adnexal abnormality is evident.  There is no significant abnormality in the lower chest. There is no significant musculoskeletal lesion. There is moderate degenerative disc disease at L5-S1.  IMPRESSION: 1. Colonic diverticulosis. 2. Small hiatal hernia 3. No acute findings are evident in the abdomen or pelvis.   Electronically Signed   By: Andreas Newport M.D.   On: 10/10/2014 05:08     CBC  Recent Labs Lab 10/22/14 1310 10/23/14 0507  WBC 14.7* 8.6  HGB 11.5* 11.2*  HCT 35.8 34.1*  PLT 415 374  MCV 85.9 86.1  MCH 27.6 28.4  MCHC 32.2 33.0  RDW 14.7* 14.9*  LYMPHSABS 2.1  --   MONOABS 1.5*  --   EOSABS 0.1  --   BASOSABS 0.0  --     Chemistries   Recent Labs Lab 10/22/14 1310 10/22/14 1709 10/23/14 0507  NA 132*  --  139  K 4.1  --  4.3  CL 96*  --  106  CO2 23  --  26  GLUCOSE 104*  --  123*  BUN 29*  --  20  CREATININE 2.21*  --  1.39*  CALCIUM 9.2  --  8.9  MG  --  1.9  --   AST 36  --   --   ALT 43  --   --   ALKPHOS 129*  --   --   BILITOT 1.0  --   --     ------------------------------------------------------------------------------------------------------------------ estimated creatinine clearance is 52.6 mL/min (by C-G formula based on Cr of 1.39). ------------------------------------------------------------------------------------------------------------------ No results for input(s): HGBA1C in the last 72 hours. ------------------------------------------------------------------------------------------------------------------ No results for input(s): CHOL, HDL, LDLCALC, TRIG, CHOLHDL, LDLDIRECT in the last 72 hours. ------------------------------------------------------------------------------------------------------------------ No results for input(s): TSH, T4TOTAL, T3FREE, THYROIDAB in the last 72 hours.  Invalid input(s): FREET3 ------------------------------------------------------------------------------------------------------------------ No results for input(s): VITAMINB12, FOLATE, FERRITIN, TIBC, IRON, RETICCTPCT in the last 72 hours.  Coagulation profile No results for input(s): INR, PROTIME in the last 168 hours.  No results for input(s): DDIMER in the last 72 hours.  Cardiac Enzymes  Recent Labs Lab 10/22/14 1310  TROPONINI <0.03   ------------------------------------------------------------------------------------------------------------------ Invalid input(s): POCBNP    Assessment & Plan   Acute renal failure likely due to dehydration continue IV fluids monitor renal function improved Hypotension check a random cortisol level blood pressure medications on hold Hyponatremia improved with IV fluids Sepsis, possible UTI although urinalysis is not very impressive, even her urine cultures from recent hospitalization were negative. Also renal ultrasound the done recently was negative. Await blood cultures continue broad-spectrum anabiotic's Hypertension blood pressure medications on  hold Diabetes sliding scale  insulin     Code Status Orders        Start     Ordered   10/22/14 1638  Full code   Continuous     10/22/14 1637           Consults none  DVT Prophylaxis  Heparin  Lab Results  Component Value Date   PLT 374 10/23/2014     Time Spent in minutes   35min     Dustin Flock M.D on 10/23/2014 at 2:19 PM  Between 7am to 6pm - Pager - [phone]  After 6pm go to [email or web address] - password [email or web address] [facility]   Office  [phone]

## 2014-10-24 LAB — CBC
HCT: 33.8 % — ABNORMAL LOW (ref 35.0–47.0)
Hemoglobin: 11.1 g/dL — ABNORMAL LOW (ref 12.0–16.0)
MCH: 28.2 pg (ref 26.0–34.0)
MCHC: 32.7 g/dL (ref 32.0–36.0)
MCV: 86.2 fL (ref 80.0–100.0)
PLATELETS: 340 10*3/uL (ref 150–440)
RBC: 3.92 MIL/uL (ref 3.80–5.20)
RDW: 14.7 % — ABNORMAL HIGH (ref 11.5–14.5)
WBC: 5.7 10*3/uL (ref 3.6–11.0)

## 2014-10-24 LAB — BASIC METABOLIC PANEL
Anion gap: 6 (ref 5–15)
BUN: 12 mg/dL (ref 6–20)
CALCIUM: 9 mg/dL (ref 8.9–10.3)
CO2: 26 mmol/L (ref 22–32)
CREATININE: 1.1 mg/dL — AB (ref 0.44–1.00)
Chloride: 110 mmol/L (ref 101–111)
GFR calc Af Amer: >60 mL/min (ref >60)
GFR, EST NON AFRICAN AMERICAN: 53 mL/min — AB (ref >60)
Glucose, Bld: 117 mg/dL — ABNORMAL HIGH (ref 65–99)
Potassium: 4.3 mmol/L (ref 3.5–5.1)
Sodium: 142 mmol/L (ref 135–145)

## 2014-10-24 LAB — GLUCOSE, CAPILLARY
GLUCOSE-CAPILLARY: 111 mg/dL — AB (ref 65–99)
GLUCOSE-CAPILLARY: 86 mg/dL (ref 65–99)
GLUCOSE-CAPILLARY: 82 mg/dL (ref 65–99)
Glucose-Capillary: 114 mg/dL — ABNORMAL HIGH (ref 65–99)

## 2014-10-24 LAB — CORTISOL: Cortisol, Plasma: 21.2 ug/dL

## 2014-10-24 NOTE — Progress Notes (Signed)
[place] at [facility]                                                                                                                                                                                            Patient Demographics   Emily Pitts, is a 59 y.o. female, DOB - August 30, 1955, GGY:[id number]  Admit date - 10/22/2014   Admitting Physician Demetrios Loll, MD  Outpatient Primary MD for the patient is Loistine Chance, MD   LOS - 2  Subjective: Patient had a episode where her blood pressure did drop into the 90s. She is currently asymptomatic blood pressure this morning is normal renal function is normalized    Review of Systems:   CONSTITUTIONAL: No documented fever. No fatigue, weakness. No weight gain, no weight loss.  EYES: No blurry or double vision.  ENT: No tinnitus. No postnasal drip. No redness of the oropharynx.  RESPIRATORY: No cough, no wheeze, no hemoptysis. No dyspnea.  CARDIOVASCULAR: No chest pain. No orthopnea. No palpitations. No syncope.  GASTROINTESTINAL: No nausea, no vomiting or diarrhea. No abdominal pain. No melena or hematochezia.  GENITOURINARY: No dysuria or hematuria.  ENDOCRINE: No polyuria or nocturia. No heat or cold intolerance.  HEMATOLOGY: No anemia. No bruising. No bleeding.  INTEGUMENTARY: No rashes. No lesions.  MUSCULOSKELETAL: No arthritis. No swelling. No gout.  NEUROLOGIC: No numbness, tingling, or ataxia. No seizure-type activity.  PSYCHIATRIC: No anxiety. No insomnia. No ADD.    Vitals:   Filed Vitals:   10/23/14 0802 10/23/14 1622 10/24/14 0014 10/24/14 0832  BP:  114/52 98/42 112/45  Pulse: 73 73 72 75  Temp:  98 F (36.7 C) 98.7 F (37.1 C) 97.6 F (36.4 C)  TempSrc:  Oral Oral Oral  Resp:  17  18  Height:      Weight:      SpO2: 98% 98% 100% 98%    Wt Readings from Last 3 Encounters:  10/22/14 94.348 kg (208 lb)  10/22/14 94.666 kg (208 lb 11.2 oz)  07/31/14 109.09 kg (240 lb 8 oz)      Intake/Output Summary (Last 24 hours) at 10/24/14 1400 Last data filed at 10/24/14 1303  Gross per 24 hour  Intake 3778.98 ml  Output   2500 ml  Net 1278.98 ml    Physical Exam:   GENERAL: Pleasant-appearing in no apparent distress.  HEAD, EYES, EARS, NOSE AND THROAT: Atraumatic, normocephalic. Extraocular muscles are intact. Pupils equal and reactive to light. Sclerae anicteric. No conjunctival injection. No oro-pharyngeal erythema.  NECK: Supple. There is no jugular venous distention. No bruits, no lymphadenopathy, no  thyromegaly.  HEART: Regular rate and rhythm,. No murmurs, no rubs, no clicks.  LUNGS: Clear to auscultation bilaterally. No rales or rhonchi. No wheezes.  ABDOMEN: Soft, flat, nontender, nondistended. Has good bowel sounds. No hepatosplenomegaly appreciated.  EXTREMITIES: No evidence of any cyanosis, clubbing, or peripheral edema.  +2 pedal and radial pulses bilaterally.  NEUROLOGIC: The patient is alert, awake, and oriented x3 with no focal motor or sensory deficits appreciated bilaterally.  SKIN: Moist and warm with no rashes appreciated.  Psych: Not anxious, depressed LN: No inguinal LN enlargement    Antibiotics   Anti-infectives    Start     Dose/Rate Route Frequency Ordered Stop   10/22/14 2300  piperacillin-tazobactam (ZOSYN) IVPB 3.375 g  Status:  Discontinued     3.375 g 12.5 mL/hr over 240 Minutes Intravenous 3 times per day 10/22/14 1659 10/24/14 1309   10/22/14 1430  vancomycin (VANCOCIN) IVPB 1000 mg/200 mL premix     1,000 mg 200 mL/hr over 60 Minutes Intravenous  Once 10/22/14 1423 10/22/14 1614   10/22/14 1430  piperacillin-tazobactam (ZOSYN) IVPB 3.375 g     3.375 g 100 mL/hr over 30 Minutes Intravenous  Once 10/22/14 1423 10/22/14 1514      Medications   Scheduled Meds: . aspirin EC  81 mg Oral Daily  . heparin  5,000 Units Subcutaneous 3 times per day  . insulin aspart  0-5 Units Subcutaneous QHS  . insulin aspart  0-9 Units  Subcutaneous TID WC  . loratadine  10 mg Oral Daily  . magnesium oxide  400 mg Oral Daily  . simvastatin  40 mg Oral QPM   Continuous Infusions:   PRN Meds:.acetaminophen **OR** acetaminophen, albuterol, ondansetron **OR** ondansetron (ZOFRAN) IV   Data Review:   Micro Results Recent Results (from the past 240 hour(s))  Culture, blood (routine x 2)     Status: None (Preliminary result)   Collection Time: 10/22/14  1:10 PM  Result Value Ref Range Status   Specimen Description BLOOD LEFT ARM  Final   Special Requests BOTTLES DRAWN AEROBIC AND ANAEROBIC 3CC  Final   Culture NO GROWTH 2 DAYS  Final   Report Status PENDING  Incomplete  Culture, blood (routine x 2)     Status: None (Preliminary result)   Collection Time: 10/22/14  1:10 PM  Result Value Ref Range Status   Specimen Description BLOOD RIGHT HAND  Final   Special Requests BOTTLES DRAWN AEROBIC AND ANAEROBIC  1CC  Final   Culture NO GROWTH 2 DAYS  Final   Report Status PENDING  Incomplete    Radiology Reports US Renal  10/10/2014   CLINICAL DATA:  Acute kidney injury  EXAM: RENAL / URINARY TRACT ULTRASOUND COMPLETE  COMPARISON:  CT abdomen pelvis 10/10/2014  FINDINGS: Right Kidney:  Length: 11.7 cm. Echogenicity within normal limits. No mass or hydronephrosis visualized.  Left Kidney:  Length: 12.1 cm. Echogenicity within normal limits. No mass or hydronephrosis visualized.  Bladder:  Urinate bladder not visualized and empty. The patient voided prior to the study.  IMPRESSION: Negative   Electronically Signed   By: Franchot Gallo M.D.   On: 10/10/2014 10:54   Dg Chest Portable 1 View  10/22/2014   CLINICAL DATA:  Syncope, dizziness, hypotension.  Fell 2 days ago.  EXAM: PORTABLE CHEST - 1 VIEW  COMPARISON:  08/09/2013  FINDINGS: The heart size and mediastinal contours are within normal limits. Both lungs are clear. The visualized skeletal structures are unremarkable.  IMPRESSION: No  active disease.   Electronically Signed   By:  Rolm Baptise M.D.   On: 10/22/2014 13:40   Ct Renal Stone Study  10/10/2014   CLINICAL DATA:  Fever and low back pain.  EXAM: CT ABDOMEN AND PELVIS WITHOUT CONTRAST  TECHNIQUE: Multidetector CT imaging of the abdomen and pelvis was performed following the standard protocol without IV contrast.  COMPARISON:  None.  FINDINGS: There is no urinary calculus. There is no hydronephrosis or ureteral dilatation. There are unremarkable unenhanced appearances of the liver, spleen, pancreas, adrenals and kidneys. The abdominal aorta is normal in caliber. There is no atherosclerotic calcification. There is no adenopathy in the abdomen or pelvis. There is a small hiatal hernia. The small bowel is unremarkable. There is moderate colonic diverticulosis without evidence of diverticulitis or other acute inflammatory process. There is hysterectomy. No adnexal abnormality is evident.  There is no significant abnormality in the lower chest. There is no significant musculoskeletal lesion. There is moderate degenerative disc disease at L5-S1.  IMPRESSION: 1. Colonic diverticulosis. 2. Small hiatal hernia 3. No acute findings are evident in the abdomen or pelvis.   Electronically Signed   By: Andreas Newport M.D.   On: 10/10/2014 05:08     CBC  Recent Labs Lab 10/22/14 1310 10/23/14 0507 10/24/14 0544  WBC 14.7* 8.6 5.7  HGB 11.5* 11.2* 11.1*  HCT 35.8 34.1* 33.8*  PLT 415 374 340  MCV 85.9 86.1 86.2  MCH 27.6 28.4 28.2  MCHC 32.2 33.0 32.7  RDW 14.7* 14.9* 14.7*  LYMPHSABS 2.1  --   --   MONOABS 1.5*  --   --   EOSABS 0.1  --   --   BASOSABS 0.0  --   --     Chemistries   Recent Labs Lab 10/22/14 1310 10/22/14 1709 10/23/14 0507 10/24/14 0544  NA 132*  --  139 142  K 4.1  --  4.3 4.3  CL 96*  --  106 110  CO2 23  --  26 26  GLUCOSE 104*  --  123* 117*  BUN 29*  --  20 12  CREATININE 2.21*  --  1.39* 1.10*  CALCIUM 9.2  --  8.9 9.0  MG  --  1.9  --   --   AST 36  --   --   --   ALT 43  --   --    --   ALKPHOS 129*  --   --   --   BILITOT 1.0  --   --   --    ------------------------------------------------------------------------------------------------------------------ estimated creatinine clearance is 66.5 mL/min (by C-G formula based on Cr of 1.1). ------------------------------------------------------------------------------------------------------------------ No results for input(s): HGBA1C in the last 72 hours. ------------------------------------------------------------------------------------------------------------------ No results for input(s): CHOL, HDL, LDLCALC, TRIG, CHOLHDL, LDLDIRECT in the last 72 hours. ------------------------------------------------------------------------------------------------------------------ No results for input(s): TSH, T4TOTAL, T3FREE, THYROIDAB in the last 72 hours.  Invalid input(s): FREET3 ------------------------------------------------------------------------------------------------------------------ No results for input(s): VITAMINB12, FOLATE, FERRITIN, TIBC, IRON, RETICCTPCT in the last 72 hours.  Coagulation profile No results for input(s): INR, PROTIME in the last 168 hours.  No results for input(s): DDIMER in the last 72 hours.  Cardiac Enzymes  Recent Labs Lab 10/22/14 1310  TROPONINI <0.03   ------------------------------------------------------------------------------------------------------------------ Invalid input(s): POCBNP    Assessment & Plan   1. Acute renal failure likely due to dehydration and blood pressure medications, I'll resolved, stop IV fluids monitor blood pressure and renal function .2. Hypotension  cortisol level pending  blood pressure meds on hold 3. Hyponatremia resolved 4. Sepsis, possible UTI although urinalysis is not very impressive, even her urine cultures from recent hospitalization were negative. Also renal ultrasound the done recently was negative. Await blood cultures continue  broad-spectrum anabiotic's 5. Hypertension blood pressure medications on hold 6. Diabetes sliding scale insulin     Code Status Orders        Start     Ordered   10/22/14 1638  Full code   Continuous     10/22/14 1637           Consults none  DVT Prophylaxis  Heparin  Lab Results  Component Value Date   PLT 340 10/24/2014     Time Spent in minutes   32min     Dustin Flock M.D on 10/24/2014 at 2:00 PM  Between 7am to 6pm - Pager - [phone]  After 6pm go to [email or web address] - password [email or web address] [facility]   Office  [phone]

## 2014-10-25 LAB — GLUCOSE, CAPILLARY: Glucose-Capillary: 113 mg/dL — ABNORMAL HIGH (ref 65–99)

## 2014-10-25 LAB — BASIC METABOLIC PANEL
Anion gap: 8 (ref 5–15)
BUN: 10 mg/dL (ref 6–20)
CHLORIDE: 107 mmol/L (ref 101–111)
CO2: 25 mmol/L (ref 22–32)
CREATININE: 0.86 mg/dL (ref 0.44–1.00)
Calcium: 9.4 mg/dL (ref 8.9–10.3)
GFR calc Af Amer: >60 mL/min (ref >60)
GFR calc non Af Amer: >60 mL/min (ref >60)
GLUCOSE: 115 mg/dL — AB (ref 65–99)
POTASSIUM: 4.3 mmol/L (ref 3.5–5.1)
SODIUM: 140 mmol/L (ref 135–145)

## 2014-10-25 LAB — CBC
HEMATOCRIT: 34.2 % — AB (ref 35.0–47.0)
HEMOGLOBIN: 11.2 g/dL — AB (ref 12.0–16.0)
MCH: 28.2 pg (ref 26.0–34.0)
MCHC: 32.8 g/dL (ref 32.0–36.0)
MCV: 85.9 fL (ref 80.0–100.0)
Platelets: 362 10*3/uL (ref 150–440)
RBC: 3.99 MIL/uL (ref 3.80–5.20)
RDW: 14.3 % (ref 11.5–14.5)
WBC: 6.2 10*3/uL (ref 3.6–11.0)

## 2014-10-25 NOTE — Discharge Instructions (Signed)
Low sodium and ADA diet. Activity as tolerated.

## 2014-10-25 NOTE — Discharge Summary (Signed)
[place] at [place] NAME: Emily Pitts    MR#:  [id number]  DATE OF BIRTH:  1956-01-19  DATE OF ADMISSION:  10/22/2014 ADMITTING PHYSICIAN: Demetrios Loll, MD  DATE OF DISCHARGE: 10/25/2014 11:57 AM  PRIMARY CARE PHYSICIAN: Loistine Chance, MD    ADMISSION DIAGNOSIS:  SIRS (systemic inflammatory response syndrome) [A41.9] Acute renal failure, unspecified acute renal failure type [N17.9]   DISCHARGE DIAGNOSIS:  Acute renal failure due to dehydration Hypotension Sepsis with UTI Hyponatremia  SECONDARY DIAGNOSIS:   Past Medical History  Diagnosis Date  . Hypertension   . Hyperlipidemia   . Allergy   . Diabetes mellitus without complication 9030  . Vitamin D deficiency   . Numbness of feet   . Low back pain, episodic     HOSPITAL COURSE:    1. Acute renal failure likely due to dehydration and blood pressure medications, improved after IV fluid support.  2. Hypotension blood pressure is better after IV fluid support. Hypertension medication was on hold. 3. Hyponatremia. resolved after IV fluid support. 4. Sepsis, possible UTI although urinalysis is not very impressive, even her urine cultures from recent hospitalization were negative. Also renal ultrasound the done recently was negative. Blood culture and negative. The patient was treated with the Zosyn which was discontinued. Sepsis improved. 5. Hypertension.  blood pressure medications was discontinued due to hypotension. 6. Diabetes sliding scale insulin  DISCHARGE CONDITIONS:   Stable, the patient was discharged to home today.  CONSULTS OBTAINED:  Treatment Team:  Demetrios Loll, MD  DRUG ALLERGIES:  No Known Allergies  DISCHARGE MEDICATIONS:   Discharge Medication List as of 10/25/2014 11:44 AM    CONTINUE these medications which have NOT CHANGED   Details  acetaminophen (TYLENOL) 325 MG tablet Take 2 tablets (650 mg total) by mouth every 6 (six) hours as needed for  mild pain (or Fever >/= 101)., Starting 10/12/2014, Until Discontinued, OTC    aspirin EC 81 MG tablet Take 81 mg by mouth daily., Until Discontinued, Historical Med    levocetirizine (XYZAL) 5 MG tablet Take 5 mg by mouth daily. , Starting 02/18/2013, Until Discontinued, Historical Med    magnesium oxide (MAG-OX) 400 MG tablet Take 400 mg by mouth daily., Until Discontinued, Historical Med    Multiple Vitamins-Minerals (MULTIVITAMIN WITH MINERALS) tablet Take 1 tablet by mouth daily., Until Discontinued, Historical Med    simvastatin (ZOCOR) 40 MG tablet Take 1 tablet by mouth every evening., Starting 08/25/2014, Until Discontinued, Historical Med    vitamin B-12 (CYANOCOBALAMIN) 1000 MCG tablet Take 1,000 mcg by mouth daily., Until Discontinued, Historical Med    vitamin C (ASCORBIC ACID) 500 MG tablet Take 500 mg by mouth daily., Until Discontinued, Historical Med      STOP taking these medications     ciprofloxacin (CIPRO) 500 MG tablet      Olmesartan-Amlodipine-HCTZ (TRIBENZOR) 40-5-25 MG TABS          DISCHARGE INSTRUCTIONS:    If you experience worsening of your admission symptoms, develop shortness of breath, life threatening emergency, suicidal or homicidal thoughts you must seek medical attention immediately by calling 911 or calling your MD immediately  if symptoms less severe.  You Must read complete instructions/literature along with all the possible adverse reactions/side effects for all the Medicines you take and that have been prescribed to you. Take any new Medicines after you have completely understood and accept all the possible adverse reactions/side effects.   Please note  You were cared for by a hospitalist during your hospital stay. If you have any questions about your discharge medications or the care you received while you were in the hospital after you are discharged, you can call the unit and asked to speak with the hospitalist on call if the hospitalist  that took care of you is not available. Once you are discharged, your primary care physician will handle any further medical issues. Please note that NO REFILLS for any discharge medications will be authorized once you are discharged, as it is imperative that you return to your primary care physician (or establish a relationship with a primary care physician if you do not have one) for your aftercare needs so that they can reassess your need for medications and monitor your lab values.    Today   SUBJECTIVE    No complaint  VITAL SIGNS:  Blood pressure 119/63, pulse 81, temperature 98.3 F (36.8 C), temperature source Oral, resp. rate 18, height 5\' 9"  (1.753 m), weight 94.348 kg (208 lb), SpO2 99 %.  I/O:   Intake/Output Summary (Last 24 hours) at 10/25/14 1715 Last data filed at 10/25/14 0745  Gross per 24 hour  Intake    480 ml  Output    700 ml  Net   -220 ml    PHYSICAL EXAMINATION:  GENERAL:  59 y.o.-year-old patient lying in the bed with no acute distress.  EYES: Pupils equal, round, reactive to light and accommodation. No scleral icterus. Extraocular muscles intact.  HEENT: Head atraumatic, normocephalic. Oropharynx and nasopharynx clear. Moist oral mucosa.  NECK:  Supple, no jugular venous distention. No thyroid enlargement, no tenderness.  LUNGS: Normal breath sounds bilaterally, no wheezing, rales,rhonchi or crepitation. No use of accessory muscles of respiration.  CARDIOVASCULAR: S1, S2 normal. No murmurs, rubs, or gallops.  ABDOMEN: Soft, non-tender, non-distended. Bowel sounds present. No organomegaly or mass.  EXTREMITIES: No pedal edema, cyanosis, or clubbing.  NEUROLOGIC: Cranial nerves II through XII are intact. Muscle strength 5/5 in all extremities. Sensation intact. Gait not checked.  PSYCHIATRIC: The patient is alert and oriented x 3.  SKIN: No obvious rash, lesion, or ulcer.   DATA REVIEW:   CBC  Recent Labs Lab 10/25/14 0739  WBC 6.2  HGB 11.2*   HCT 34.2*  PLT 362    Chemistries   Recent Labs Lab 10/22/14 1310 10/22/14 1709  10/25/14 0739  NA 132*  --   < > 140  K 4.1  --   < > 4.3  CL 96*  --   < > 107  CO2 23  --   < > 25  GLUCOSE 104*  --   < > 115*  BUN 29*  --   < > 10  CREATININE 2.21*  --   < > 0.86  CALCIUM 9.2  --   < > 9.4  MG  --  1.9  --   --   AST 36  --   --   --   ALT 43  --   --   --   ALKPHOS 129*  --   --   --   BILITOT 1.0  --   --   --   < > = values in this interval not displayed.  Cardiac Enzymes  Recent Labs Lab 10/22/14 1310  TROPONINI <0.03    Microbiology Results  Results for orders placed or performed during the hospital encounter of 10/22/14  Culture, blood (routine x 2)  Status: None (Preliminary result)   Collection Time: 10/22/14  1:10 PM  Result Value Ref Range Status   Specimen Description BLOOD LEFT ARM  Final   Special Requests BOTTLES DRAWN AEROBIC AND ANAEROBIC 3CC  Final   Culture NO GROWTH 3 DAYS  Final   Report Status PENDING  Incomplete  Culture, blood (routine x 2)     Status: None (Preliminary result)   Collection Time: 10/22/14  1:10 PM  Result Value Ref Range Status   Specimen Description BLOOD RIGHT HAND  Final   Special Requests BOTTLES DRAWN AEROBIC AND ANAEROBIC  1CC  Final   Culture NO GROWTH 3 DAYS  Final   Report Status PENDING  Incomplete    RADIOLOGY:  No results found.      Management plans discussed with the patient, family and they are in agreement.  CODE STATUS:   TOTAL TIME TAKING CARE OF THIS PATIENT: 37 minutes.    Demetrios Loll M.D on 10/25/2014 at 5:15 PM  Between 7am to 6pm - Pager - [phone]  After 6pm go to [email or web address] - password [email or web address] [facility]  Office  [phone]  CC: Primary care physician; Loistine Chance, MD

## 2014-10-27 LAB — CULTURE, BLOOD (ROUTINE X 2)
CULTURE: NO GROWTH
Culture: NO GROWTH

## 2014-10-30 ENCOUNTER — Ambulatory Visit (INDEPENDENT_AMBULATORY_CARE_PROVIDER_SITE_OTHER): Payer: BC Managed Care – PPO | Admitting: Family Medicine

## 2014-10-30 ENCOUNTER — Encounter: Payer: Self-pay | Admitting: Family Medicine

## 2014-10-30 VITALS — BP 140/84 | HR 92 | Temp 98.0°F | Resp 18 | Ht 68.0 in | Wt 223.8 lb

## 2014-10-30 DIAGNOSIS — Z8619 Personal history of other infectious and parasitic diseases: Secondary | ICD-10-CM | POA: Diagnosis not present

## 2014-10-30 DIAGNOSIS — N179 Acute kidney failure, unspecified: Secondary | ICD-10-CM | POA: Diagnosis not present

## 2014-10-30 DIAGNOSIS — Z09 Encounter for follow-up examination after completed treatment for conditions other than malignant neoplasm: Secondary | ICD-10-CM

## 2014-10-30 DIAGNOSIS — E1129 Type 2 diabetes mellitus with other diabetic kidney complication: Secondary | ICD-10-CM | POA: Diagnosis not present

## 2014-10-30 DIAGNOSIS — I10 Essential (primary) hypertension: Secondary | ICD-10-CM | POA: Diagnosis not present

## 2014-10-30 MED ORDER — GLUCOSE BLOOD VI STRP: ORAL_STRIP | Status: DC

## 2014-10-30 MED ORDER — LOSARTAN POTASSIUM 50 MG PO TABS: 50.0000 mg | ORAL_TABLET | Freq: Every day | ORAL | Status: DC

## 2014-10-30 NOTE — Progress Notes (Signed)
Name: Emily Pitts   MRN: [id number]    DOB: [birth year]   Date:10/30/2014       Progress Note  Subjective  Chief Complaint  Chief Complaint  Patient presents with  . Hospitalization Follow-up  . Acute Renal Failure    due to dhydration  . Hypotension  . Blood Infection    due to UTI    HPI  Hospital follow up: she was admitted to [facility] on 10/10/2014 for Pyelonephritis and sepsis, discharged on 10/12/2014 without antibiotics and continued to feel tired, no appetite, and some back pain , but no dysuria, she finally came in for evaluation on 10/22/2014 and was hypotensive, dizzy, very fatigued and pale and was transported by EMS to [facility], she was re-admitted given antibiotics for Urosepsis, Tribenzor was stopped because of hypotension, and she also had acute renal failure with GFR below 30.  She states that within 24 hours she started to fell better, since discharge she has been eating well, normal appetite, still feels a little tired, but not like before she was admitted, no dysuria, no dizziness. She has been off Tribenzor and bp at home has been within normal limits 127-136/69-76. She had FMLA forms filled out but thinks she may be able to go back to work sooner than anticipated.   Patient Active Problem List   Diagnosis Date Noted  . Protein-calorie malnutrition, severe 10/23/2014  . ARF (acute renal failure) 10/22/2014  . Hypotension 10/22/2014  . Hyponatremia 10/22/2014  . History of sepsis 10/10/2014  . Abnormal electrocardiogram 10/08/2014  . Nonspecific abnormal electromyogram (EMG) 10/08/2014  . Benign essential HTN 10/08/2014  . Diabetes mellitus with renal manifestation 10/08/2014  . Dyslipidemia 10/08/2014  . LBP (low back pain) 10/08/2014  . Gastro-esophageal reflux disease without esophagitis 10/08/2014  . Microalbuminuria 10/08/2014  . Disturbance of skin sensation 10/08/2014  . Obesity (BMI 30-39.9) 10/08/2014  . Perennial allergic rhinitis with seasonal variation  10/08/2014  . Plantar fasciitis 10/08/2014  . Postablative ovarian failure 10/08/2014  . Feeling stressed out 10/08/2014  . Menopausal symptom 10/08/2014  . Vitamin D deficiency 10/08/2014  . Encounter for screening colonoscopy 05/15/2013    Past Surgical History  Procedure Laterality Date  . Appendectomy  1966  . Abdominal hysterectomy  2010  . Dilation and curettage of uterus      Family History  Problem Relation Age of Onset  . Diabetes Mother   . COPD Mother   . Diabetes Father   . Hypertension Father   . Cancer Father     Kidney and Prostate  . CAD Father   . Diabetes Brother     Oldest Brother    Social History   Social History  . Marital Status: Single    Spouse Name: N/A  . Number of Children: N/A  . Years of Education: N/A   Occupational History  . Not on file.   Social History Main Topics  . Smoking status: Never Smoker   . Smokeless tobacco: Never Used  . Alcohol Use: No  . Drug Use: No  . Sexual Activity: Not on file   Other Topics Concern  . Not on file   Social History Narrative     Current outpatient prescriptions:  .  aspirin EC 81 MG tablet, Take 81 mg by mouth daily., Disp: , Rfl:  .  fluticasone (FLONASE) 50 MCG/ACT nasal spray, Place 2 sprays into both nostrils as needed., Disp: , Rfl:  .  glucose blood (ONE TOUCH ULTRA TEST) test  strip, Use as instructed, Disp: 100 each, Rfl: 12 .  levocetirizine (XYZAL) 5 MG tablet, Take 5 mg by mouth daily. , Disp: , Rfl:  .  losartan (COZAAR) 50 MG tablet, Take 1 tablet (50 mg total) by mouth daily., Disp: 90 tablet, Rfl: 3 .  magnesium oxide (MAG-OX) 400 MG tablet, Take 400 mg by mouth daily., Disp: , Rfl:  .  Multiple Vitamins-Minerals (MULTIVITAMIN WITH MINERALS) tablet, Take 1 tablet by mouth daily., Disp: , Rfl:  .  simvastatin (ZOCOR) 40 MG tablet, Take 1 tablet by mouth every evening., Disp: , Rfl:  .  vitamin B-12 (CYANOCOBALAMIN) 1000 MCG tablet, Take 1,000 mcg by mouth daily., Disp: ,  Rfl:  .  vitamin C (ASCORBIC ACID) 500 MG tablet, Take 500 mg by mouth daily., Disp: , Rfl:   No Known Allergies   ROS  Constitutional: Negative for fever positive for  weight change.  Respiratory: Negative for cough and shortness of breath.   Cardiovascular: Negative for chest pain or palpitations.  Gastrointestinal: Negative for abdominal pain, no bowel changes.  Musculoskeletal: Negative for gait problem or joint swelling.  Skin: Negative for rash.  Neurological: Negative for dizziness or headache.  No other specific complaints in a complete review of systems (except as listed in HPI above).  Objective  Filed Vitals:   10/30/14 1213  BP: 140/84  Pulse: 92  Temp: 98 F (36.7 C)  TempSrc: Oral  Resp: 18  Height: 5' 8"  (1.727 m)  Weight: 223 lb 12.8 oz (101.515 kg)  SpO2: 97%    Body mass index is 34.04 kg/(m^2).  Physical Exam  Constitutional: Patient appears well-developed and well-nourished. Obese  No distress.  HEENT: head atraumatic, normocephalic, pupils equal and reactive to light, neck supple, throat within normal limits Cardiovascular: Normal rate, regular rhythm and normal heart sounds.  No murmur heard. No BLE edema. Pulmonary/Chest: Effort normal and breath sounds normal. No respiratory distress. Abdominal: Soft.  There is no tenderness. Negative CVA tenderness Psychiatric: Patient has a normal mood and affect. behavior is normal. Judgment and thought content normal.  Recent Results (from the past 2160 hour(s))  POCT urinalysis dipstick     Status: Abnormal   Collection Time: 10/08/14  2:56 PM  Result Value Ref Range   Color, UA ORANGISH RED    Clarity, UA DARK    Glucose, UA NEGATIVE    Bilirubin, UA NEGATIVE    Ketones, UA NEGATIVE    Spec Grav, UA >=1.030    Blood, UA NEGATIVE    pH, UA 6.0    Protein, UA TRACE    Urobilinogen, UA 1.0    Nitrite, UA NEGATIVE    Leukocytes, UA small (1+) (A) Negative  Urine culture     Status: None    Collection Time: 10/09/14 12:00 AM  Result Value Ref Range   Urine Culture, Routine Final report    Urine Culture result 1 Comment     Comment: Mixed urogenital flora Less than 10,000 colonies/mL   CBC     Status: Abnormal   Collection Time: 10/10/14  3:51 AM  Result Value Ref Range   WBC 13.5 (H) 3.6 - 11.0 K/uL   RBC 3.83 3.80 - 5.20 MIL/uL   Hemoglobin 10.8 (L) 12.0 - 16.0 g/dL   HCT 32.2 (L) 35.0 - 47.0 %   MCV 84.1 80.0 - 100.0 fL   MCH 28.2 26.0 - 34.0 pg   MCHC 33.5 32.0 - 36.0 g/dL   RDW 14.4 11.5 -  14.5 %   Platelets 181 150 - 440 K/uL  Comprehensive metabolic panel     Status: Abnormal   Collection Time: 10/10/14  3:51 AM  Result Value Ref Range   Sodium 134 (L) 135 - 145 mmol/L   Potassium 3.3 (L) 3.5 - 5.1 mmol/L   Chloride 97 (L) 101 - 111 mmol/L   CO2 22 22 - 32 mmol/L   Glucose, Bld 180 (H) 65 - 99 mg/dL   BUN 36 (H) 6 - 20 mg/dL   Creatinine, Ser 2.35 (H) 0.44 - 1.00 mg/dL   Calcium 8.3 (L) 8.9 - 10.3 mg/dL   Total Protein 7.1 6.5 - 8.1 g/dL   Albumin 2.9 (L) 3.5 - 5.0 g/dL   AST 97 (H) 15 - 41 U/L   ALT 95 (H) 14 - 54 U/L   Alkaline Phosphatase 134 (H) 38 - 126 U/L   Total Bilirubin 2.7 (H) 0.3 - 1.2 mg/dL   GFR calc non Af Amer 21 (L) >60 mL/min   GFR calc Af Amer 25 (L) >60 mL/min    Comment: (NOTE) The eGFR has been calculated using the CKD EPI equation. This calculation has not been validated in all clinical situations. eGFR's persistently <60 mL/min signify possible Chronic Kidney Disease.    Anion gap 15 5 - 15  Blood culture (routine x 2)     Status: None   Collection Time: 10/10/14  3:51 AM  Result Value Ref Range   Specimen Description BLOOD LEFT ASSIST CONTROL    Special Requests BOTTLES DRAWN AEROBIC AND ANAEROBIC 1CC    Culture  Setup Time      GRAM POSITIVE COCCI ANAEROBIC BOTTLE ONLY CRITICAL RESULT CALLED TO, READ BACK BY AND VERIFIED WITH: ALISHA RAWLINS AT 7001 10/14/14.PMH CONFIRMED BY RW    Culture      VIRIDANS  STREPTOCOCCUS ANAEROBIC BOTTLE ONLY POSSIBLE CONTAMINATION WITH SKIN FLORA    Report Status 10/15/2014 FINAL   Blood culture (routine x 2)     Status: None   Collection Time: 10/10/14  3:51 AM  Result Value Ref Range   Specimen Description BLOOD LEFT HAND    Special Requests BOTTLES DRAWN AEROBIC AND ANAEROBIC 1CC    Culture NO GROWTH 5 DAYS    Report Status 10/15/2014 FINAL   TSH     Status: None   Collection Time: 10/10/14  3:51 AM  Result Value Ref Range   TSH 1.289 0.350 - 4.500 uIU/mL  Hemoglobin A1c     Status: Abnormal   Collection Time: 10/10/14  3:51 AM  Result Value Ref Range   Hgb A1c MFr Bld 7.4 (H) 4.0 - 6.0 %  Lactic acid, plasma     Status: None   Collection Time: 10/10/14  4:09 AM  Result Value Ref Range   Lactic Acid, Venous 1.6 0.5 - 2.0 mmol/L  Urinalysis complete, with microscopic ([facility] only)     Status: Abnormal   Collection Time: 10/10/14  7:16 AM  Result Value Ref Range   Color, Urine AMBER (A) YELLOW   APPearance CLOUDY (A) CLEAR   Glucose, UA NEGATIVE NEGATIVE mg/dL   Bilirubin Urine NEGATIVE NEGATIVE   Ketones, ur NEGATIVE NEGATIVE mg/dL   Specific Gravity, Urine 1.013 1.005 - 1.030   Hgb urine dipstick 1+ (A) NEGATIVE   pH 5.0 5.0 - 8.0   Protein, ur 30 (A) NEGATIVE mg/dL   Nitrite NEGATIVE NEGATIVE   Leukocytes, UA NEGATIVE NEGATIVE   RBC / HPF 0-5 0 - 5  RBC/hpf   WBC, UA 6-30 0 - 5 WBC/hpf   Bacteria, UA RARE (A) NONE SEEN   Squamous Epithelial / LPF 0-5 (A) NONE SEEN   Mucous PRESENT    Hyaline Casts, UA PRESENT    Granular Casts, UA PRESENT    Amorphous Crystal PRESENT   Urine culture     Status: None   Collection Time: 10/10/14  7:16 AM  Result Value Ref Range   Specimen Description URINE, CLEAN CATCH    Special Requests NONE    Culture NO GROWTH 2 DAYS    Report Status 10/12/2014 FINAL   Lactic acid, plasma     Status: None   Collection Time: 10/10/14  8:34 AM  Result Value Ref Range   Lactic Acid, Venous 1.2 0.5 - 2.0 mmol/L   Basic metabolic panel     Status: Abnormal   Collection Time: 10/11/14  4:20 AM  Result Value Ref Range   Sodium 136 135 - 145 mmol/L   Potassium 3.1 (L) 3.5 - 5.1 mmol/L   Chloride 105 101 - 111 mmol/L   CO2 23 22 - 32 mmol/L   Glucose, Bld 177 (H) 65 - 99 mg/dL   BUN 24 (H) 6 - 20 mg/dL   Creatinine, Ser 1.26 (H) 0.44 - 1.00 mg/dL   Calcium 8.1 (L) 8.9 - 10.3 mg/dL   GFR calc non Af Amer 45 (L) >60 mL/min   GFR calc Af Amer 53 (L) >60 mL/min    Comment: (NOTE) The eGFR has been calculated using the CKD EPI equation. This calculation has not been validated in all clinical situations. eGFR's persistently <60 mL/min signify possible Chronic Kidney Disease.    Anion gap 8 5 - 15  CBC     Status: Abnormal   Collection Time: 10/11/14  4:20 AM  Result Value Ref Range   WBC 9.7 3.6 - 11.0 K/uL   RBC 3.67 (L) 3.80 - 5.20 MIL/uL   Hemoglobin 10.4 (L) 12.0 - 16.0 g/dL   HCT 31.3 (L) 35.0 - 47.0 %   MCV 85.3 80.0 - 100.0 fL   MCH 28.3 26.0 - 34.0 pg   MCHC 33.1 32.0 - 36.0 g/dL   RDW 14.7 (H) 11.5 - 14.5 %   Platelets 176 150 - 440 K/uL  Glucose, capillary     Status: Abnormal   Collection Time: 10/11/14  4:22 PM  Result Value Ref Range   Glucose-Capillary 156 (H) 65 - 99 mg/dL   Comment 1 Notify RN   Glucose, capillary     Status: Abnormal   Collection Time: 10/11/14  7:32 PM  Result Value Ref Range   Glucose-Capillary 188 (H) 65 - 99 mg/dL  Basic metabolic panel     Status: Abnormal   Collection Time: 10/12/14  4:44 AM  Result Value Ref Range   Sodium 140 135 - 145 mmol/L   Potassium 3.2 (L) 3.5 - 5.1 mmol/L   Chloride 103 101 - 111 mmol/L   CO2 29 22 - 32 mmol/L   Glucose, Bld 152 (H) 65 - 99 mg/dL   BUN 13 6 - 20 mg/dL   Creatinine, Ser 0.90 0.44 - 1.00 mg/dL   Calcium 8.6 (L) 8.9 - 10.3 mg/dL   GFR calc non Af Amer >60 >60 mL/min   GFR calc Af Amer >60 >60 mL/min    Comment: (NOTE) The eGFR has been calculated using the CKD EPI equation. This calculation has not  been validated in all clinical situations. eGFR's  persistently <60 mL/min signify possible Chronic Kidney Disease.    Anion gap 8 5 - 15  Magnesium     Status: None   Collection Time: 10/12/14  4:44 AM  Result Value Ref Range   Magnesium 2.0 1.7 - 2.4 mg/dL  CBC     Status: Abnormal   Collection Time: 10/12/14  4:44 AM  Result Value Ref Range   WBC 13.0 (H) 3.6 - 11.0 K/uL   RBC 3.64 (L) 3.80 - 5.20 MIL/uL   Hemoglobin 10.5 (L) 12.0 - 16.0 g/dL   HCT 30.9 (L) 35.0 - 47.0 %   MCV 84.9 80.0 - 100.0 fL   MCH 28.8 26.0 - 34.0 pg   MCHC 33.9 32.0 - 36.0 g/dL   RDW 14.6 (H) 11.5 - 14.5 %   Platelets 212 150 - 440 K/uL  Hemoglobin A1c     Status: Abnormal   Collection Time: 10/12/14  4:44 AM  Result Value Ref Range   Hgb A1c MFr Bld 6.9 (H) 4.0 - 6.0 %  Glucose, capillary     Status: Abnormal   Collection Time: 10/12/14  7:54 AM  Result Value Ref Range   Glucose-Capillary 175 (H) 65 - 99 mg/dL  Glucose, capillary     Status: None   Collection Time: 10/22/14  1:09 PM  Result Value Ref Range   Glucose-Capillary 85 65 - 99 mg/dL  Comprehensive metabolic panel     Status: Abnormal   Collection Time: 10/22/14  1:10 PM  Result Value Ref Range   Sodium 132 (L) 135 - 145 mmol/L   Potassium 4.1 3.5 - 5.1 mmol/L   Chloride 96 (L) 101 - 111 mmol/L   CO2 23 22 - 32 mmol/L   Glucose, Bld 104 (H) 65 - 99 mg/dL   BUN 29 (H) 6 - 20 mg/dL   Creatinine, Ser 2.21 (H) 0.44 - 1.00 mg/dL   Calcium 9.2 8.9 - 10.3 mg/dL   Total Protein 8.7 (H) 6.5 - 8.1 g/dL   Albumin 3.6 3.5 - 5.0 g/dL   AST 36 15 - 41 U/L   ALT 43 14 - 54 U/L   Alkaline Phosphatase 129 (H) 38 - 126 U/L   Total Bilirubin 1.0 0.3 - 1.2 mg/dL   GFR calc non Af Amer 23 (L) >60 mL/min   GFR calc Af Amer 27 (L) >60 mL/min    Comment: (NOTE) The eGFR has been calculated using the CKD EPI equation. This calculation has not been validated in all clinical situations. eGFR's persistently <60 mL/min signify possible Chronic  Kidney Disease.    Anion gap 13 5 - 15  CBC with Differential     Status: Abnormal   Collection Time: 10/22/14  1:10 PM  Result Value Ref Range   WBC 14.7 (H) 3.6 - 11.0 K/uL   RBC 4.17 3.80 - 5.20 MIL/uL   Hemoglobin 11.5 (L) 12.0 - 16.0 g/dL   HCT 35.8 35.0 - 47.0 %   MCV 85.9 80.0 - 100.0 fL   MCH 27.6 26.0 - 34.0 pg   MCHC 32.2 32.0 - 36.0 g/dL   RDW 14.7 (H) 11.5 - 14.5 %   Platelets 415 150 - 440 K/uL   Neutrophils Relative % 76 %   Neutro Abs 11.1 (H) 1.4 - 6.5 K/uL   Lymphocytes Relative 14 %   Lymphs Abs 2.1 1.0 - 3.6 K/uL   Monocytes Relative 10 %   Monocytes Absolute 1.5 (H) 0.2 - 0.9 K/uL   Eosinophils  Relative 0 %   Eosinophils Absolute 0.1 0 - 0.7 K/uL   Basophils Relative 0 %   Basophils Absolute 0.0 0 - 0.1 K/uL  Culture, blood (routine x 2)     Status: None   Collection Time: 10/22/14  1:10 PM  Result Value Ref Range   Specimen Description BLOOD LEFT ARM    Special Requests BOTTLES DRAWN AEROBIC AND ANAEROBIC 3CC    Culture NO GROWTH 5 DAYS    Report Status 10/27/2014 FINAL   Culture, blood (routine x 2)     Status: None   Collection Time: 10/22/14  1:10 PM  Result Value Ref Range   Specimen Description BLOOD RIGHT HAND    Special Requests BOTTLES DRAWN AEROBIC AND ANAEROBIC  1CC    Culture NO GROWTH 5 DAYS    Report Status 10/27/2014 FINAL   Troponin I     Status: None   Collection Time: 10/22/14  1:10 PM  Result Value Ref Range   Troponin I <0.03 <0.031 ng/mL    Comment:        NO INDICATION OF MYOCARDIAL INJURY.   Lactic acid, plasma     Status: None   Collection Time: 10/22/14  1:20 PM  Result Value Ref Range   Lactic Acid, Venous 1.9 0.5 - 2.0 mmol/L  POCT Glucose (CBG)     Status: Abnormal   Collection Time: 10/22/14  1:32 PM  Result Value Ref Range   POC Glucose 108 (A) 70 - 99 mg/dl  Urinalysis complete, with microscopic ([facility] only)     Status: Abnormal   Collection Time: 10/22/14  3:02 PM  Result Value Ref Range   Color, Urine STRAW  (A) YELLOW   APPearance CLEAR (A) CLEAR   Glucose, UA NEGATIVE NEGATIVE mg/dL   Bilirubin Urine NEGATIVE NEGATIVE   Ketones, ur NEGATIVE NEGATIVE mg/dL   Specific Gravity, Urine 1.002 (L) 1.005 - 1.030   Hgb urine dipstick 2+ (A) NEGATIVE   pH 7.0 5.0 - 8.0   Protein, ur NEGATIVE NEGATIVE mg/dL   Nitrite NEGATIVE NEGATIVE   Leukocytes, UA NEGATIVE NEGATIVE   RBC / HPF 0-5 0 - 5 RBC/hpf   WBC, UA 0-5 0 - 5 WBC/hpf   Bacteria, UA NONE SEEN NONE SEEN   Squamous Epithelial / LPF 0-5 (A) NONE SEEN  Glucose, capillary     Status: Abnormal   Collection Time: 10/22/14  5:01 PM  Result Value Ref Range   Glucose-Capillary 120 (H) 65 - 99 mg/dL  Magnesium     Status: None   Collection Time: 10/22/14  5:09 PM  Result Value Ref Range   Magnesium 1.9 1.7 - 2.4 mg/dL  Glucose, capillary     Status: None   Collection Time: 10/22/14  9:48 PM  Result Value Ref Range   Glucose-Capillary 96 65 - 99 mg/dL   Comment 1 Notify RN   Basic metabolic panel     Status: Abnormal   Collection Time: 10/23/14  5:07 AM  Result Value Ref Range   Sodium 139 135 - 145 mmol/L   Potassium 4.3 3.5 - 5.1 mmol/L   Chloride 106 101 - 111 mmol/L   CO2 26 22 - 32 mmol/L   Glucose, Bld 123 (H) 65 - 99 mg/dL   BUN 20 6 - 20 mg/dL   Creatinine, Ser 1.39 (H) 0.44 - 1.00 mg/dL   Calcium 8.9 8.9 - 10.3 mg/dL   GFR calc non Af Amer 40 (L) >60 mL/min   GFR  calc Af Amer 47 (L) >60 mL/min    Comment: (NOTE) The eGFR has been calculated using the CKD EPI equation. This calculation has not been validated in all clinical situations. eGFR's persistently <60 mL/min signify possible Chronic Kidney Disease.    Anion gap 7 5 - 15  CBC     Status: Abnormal   Collection Time: 10/23/14  5:07 AM  Result Value Ref Range   WBC 8.6 3.6 - 11.0 K/uL   RBC 3.96 3.80 - 5.20 MIL/uL   Hemoglobin 11.2 (L) 12.0 - 16.0 g/dL   HCT 34.1 (L) 35.0 - 47.0 %   MCV 86.1 80.0 - 100.0 fL   MCH 28.4 26.0 - 34.0 pg   MCHC 33.0 32.0 - 36.0 g/dL    RDW 14.9 (H) 11.5 - 14.5 %   Platelets 374 150 - 440 K/uL  Glucose, capillary     Status: Abnormal   Collection Time: 10/23/14  7:27 AM  Result Value Ref Range   Glucose-Capillary 116 (H) 65 - 99 mg/dL   Comment 1 Notify RN   Glucose, capillary     Status: None   Collection Time: 10/23/14 11:21 AM  Result Value Ref Range   Glucose-Capillary 76 65 - 99 mg/dL   Comment 1 Notify RN   Glucose, capillary     Status: Abnormal   Collection Time: 10/23/14  4:42 PM  Result Value Ref Range   Glucose-Capillary 101 (H) 65 - 99 mg/dL   Comment 1 Notify RN   Glucose, capillary     Status: Abnormal   Collection Time: 10/23/14  9:23 PM  Result Value Ref Range   Glucose-Capillary 105 (H) 65 - 99 mg/dL  Cortisol     Status: None   Collection Time: 10/24/14  5:43 AM  Result Value Ref Range   Cortisol, Plasma 21.2 ug/dL    Comment: (NOTE) AM    6.7 - 22.6 ug/dL PM   <10.0       ug/dL Performed at [facility]   CBC     Status: Abnormal   Collection Time: 10/24/14  5:44 AM  Result Value Ref Range   WBC 5.7 3.6 - 11.0 K/uL   RBC 3.92 3.80 - 5.20 MIL/uL   Hemoglobin 11.1 (L) 12.0 - 16.0 g/dL   HCT 33.8 (L) 35.0 - 47.0 %   MCV 86.2 80.0 - 100.0 fL   MCH 28.2 26.0 - 34.0 pg   MCHC 32.7 32.0 - 36.0 g/dL   RDW 14.7 (H) 11.5 - 14.5 %   Platelets 340 150 - 440 K/uL  Basic metabolic panel     Status: Abnormal   Collection Time: 10/24/14  5:44 AM  Result Value Ref Range   Sodium 142 135 - 145 mmol/L   Potassium 4.3 3.5 - 5.1 mmol/L   Chloride 110 101 - 111 mmol/L   CO2 26 22 - 32 mmol/L   Glucose, Bld 117 (H) 65 - 99 mg/dL   BUN 12 6 - 20 mg/dL   Creatinine, Ser 1.10 (H) 0.44 - 1.00 mg/dL   Calcium 9.0 8.9 - 10.3 mg/dL   GFR calc non Af Amer 53 (L) >60 mL/min   GFR calc Af Amer >60 >60 mL/min    Comment: (NOTE) The eGFR has been calculated using the CKD EPI equation. This calculation has not been validated in all clinical situations. eGFR's persistently <60 mL/min signify possible  Chronic Kidney Disease.    Anion gap 6 5 - 15  Glucose,  capillary     Status: Abnormal   Collection Time: 10/24/14  7:32 AM  Result Value Ref Range   Glucose-Capillary 111 (H) 65 - 99 mg/dL   Comment 1 Notify RN   Glucose, capillary     Status: Abnormal   Collection Time: 10/24/14 11:47 AM  Result Value Ref Range   Glucose-Capillary 114 (H) 65 - 99 mg/dL   Comment 1 Notify RN   Glucose, capillary     Status: None   Collection Time: 10/24/14  4:35 PM  Result Value Ref Range   Glucose-Capillary 86 65 - 99 mg/dL   Comment 1 Notify RN   Glucose, capillary     Status: None   Collection Time: 10/24/14  9:25 PM  Result Value Ref Range   Glucose-Capillary 82 65 - 99 mg/dL   Comment 1 Notify RN   CBC     Status: Abnormal   Collection Time: 10/25/14  7:39 AM  Result Value Ref Range   WBC 6.2 3.6 - 11.0 K/uL   RBC 3.99 3.80 - 5.20 MIL/uL   Hemoglobin 11.2 (L) 12.0 - 16.0 g/dL   HCT 34.2 (L) 35.0 - 47.0 %   MCV 85.9 80.0 - 100.0 fL   MCH 28.2 26.0 - 34.0 pg   MCHC 32.8 32.0 - 36.0 g/dL   RDW 14.3 11.5 - 14.5 %   Platelets 362 150 - 440 K/uL  Basic metabolic panel     Status: Abnormal   Collection Time: 10/25/14  7:39 AM  Result Value Ref Range   Sodium 140 135 - 145 mmol/L   Potassium 4.3 3.5 - 5.1 mmol/L   Chloride 107 101 - 111 mmol/L   CO2 25 22 - 32 mmol/L   Glucose, Bld 115 (H) 65 - 99 mg/dL   BUN 10 6 - 20 mg/dL   Creatinine, Ser 0.86 0.44 - 1.00 mg/dL   Calcium 9.4 8.9 - 10.3 mg/dL   GFR calc non Af Amer >60 >60 mL/min   GFR calc Af Amer >60 >60 mL/min    Comment: (NOTE) The eGFR has been calculated using the CKD EPI equation. This calculation has not been validated in all clinical situations. eGFR's persistently <60 mL/min signify possible Chronic Kidney Disease.    Anion gap 8 5 - 15  Glucose, capillary     Status: Abnormal   Collection Time: 10/25/14  7:50 AM  Result Value Ref Range   Glucose-Capillary 113 (H) 65 - 99 mg/dL   Comment 1 Notify RN        PHQ2/9: Depression screen PHQ 2/9 10/08/2014  Decreased Interest 0  Down, Depressed, Hopeless 0  PHQ - 2 Score 0     Fall Risk: Fall Risk  10/08/2014  Falls in the past year? No      Assessment & Plan  1. Hospital discharge follow-up   2. Acute renal failure, unspecified acute renal failure type Resolved prior to discharge, continue good water intake  3. History of sepsis resolved  4. Benign essential HTN bp is good, but has diabetic nephropathy, advised to resume medication slowly if bp goes above 140/90 - losartan (COZAAR) 50 MG tablet; Take 1 tablet (50 mg total) by mouth daily.  Dispense: 90 tablet; Refill: 3  5. Type 2 diabetes mellitus with other diabetic kidney complication  - glucose blood (ONE TOUCH ULTRA TEST) test strip; Use as instructed  Dispense: 100 each; Refill: 12 - losartan (COZAAR) 50 MG tablet; Take 1 tablet (50 mg total) by  mouth daily.  Dispense: 90 tablet; Refill: 3

## 2014-10-31 ENCOUNTER — Ambulatory Visit: Payer: BC Managed Care – PPO | Admitting: Family Medicine

## 2014-11-05 ENCOUNTER — Ambulatory Visit
Admission: RE | Admit: 2014-11-05 | Discharge: 2014-11-05 | Disposition: A | Discharge status: Home / Self Care | Payer: BC Managed Care – PPO | Source: Ambulatory Visit | Attending: Family Medicine | Admitting: Family Medicine

## 2014-11-05 ENCOUNTER — Encounter: Payer: Self-pay | Admitting: Family Medicine

## 2014-11-05 ENCOUNTER — Ambulatory Visit (INDEPENDENT_AMBULATORY_CARE_PROVIDER_SITE_OTHER): Payer: BC Managed Care – PPO | Admitting: Family Medicine

## 2014-11-05 VITALS — BP 136/58 | HR 105 | Temp 97.9°F | Resp 16 | Ht 68.0 in | Wt 216.4 lb

## 2014-11-05 DIAGNOSIS — Z23 Encounter for immunization: Secondary | ICD-10-CM | POA: Diagnosis not present

## 2014-11-05 DIAGNOSIS — K30 Functional dyspepsia: Secondary | ICD-10-CM

## 2014-11-05 DIAGNOSIS — R109 Unspecified abdominal pain: Secondary | ICD-10-CM

## 2014-11-05 DIAGNOSIS — R059 Cough, unspecified: Secondary | ICD-10-CM

## 2014-11-05 DIAGNOSIS — R05 Cough: Secondary | ICD-10-CM | POA: Insufficient documentation

## 2014-11-05 DIAGNOSIS — R1013 Epigastric pain: Secondary | ICD-10-CM

## 2014-11-05 MED ORDER — HYDROCODONE-ACETAMINOPHEN 10-325 MG PO TABS: 1.0000 | ORAL_TABLET | Freq: Two times a day (BID) | ORAL | Status: DC

## 2014-11-05 MED ORDER — OMEPRAZOLE 40 MG PO CPDR: 40.0000 mg | DELAYED_RELEASE_CAPSULE | Freq: Every day | ORAL | Status: DC

## 2014-11-05 NOTE — Progress Notes (Signed)
Name: Emily Pitts   MRN: [id number]    DOB: [birth year]   Date:11/05/2014       Progress Note  Subjective  Chief Complaint  Chief Complaint  Patient presents with  . Abdominal Pain    onset 5 days epigastric radiates to right side of back    HPI  Abdominal pain and indigestion: symptoms started four days ago. She states initially was epigastric pain radiating to both sides, no nausea, no vomiting or fever. She was having bowel movements daily, but took a laxative to see if symptoms would improve, but did not noticed any change. She states pain radiates to right flank area also , has some indigestion/bloating feeling. She has also developed a dry cough over the past couple of days. She is afraid to eat, her energy level has dropped .   Patient Active Problem List   Diagnosis Date Noted  . Protein-calorie malnutrition, severe 10/23/2014  . History of sepsis 10/10/2014  . Abnormal electrocardiogram 10/08/2014  . Nonspecific abnormal electromyogram (EMG) 10/08/2014  . Benign essential HTN 10/08/2014  . Diabetes mellitus with renal manifestation 10/08/2014  . Dyslipidemia 10/08/2014  . LBP (low back pain) 10/08/2014  . Gastro-esophageal reflux disease without esophagitis 10/08/2014  . Microalbuminuria 10/08/2014  . Disturbance of skin sensation 10/08/2014  . Obesity (BMI 30-39.9) 10/08/2014  . Perennial allergic rhinitis with seasonal variation 10/08/2014  . Plantar fasciitis 10/08/2014  . Postablative ovarian failure 10/08/2014  . Feeling stressed out 10/08/2014  . Menopausal symptom 10/08/2014  . Vitamin D deficiency 10/08/2014  . Encounter for screening colonoscopy 05/15/2013    Past Surgical History  Procedure Laterality Date  . Appendectomy  1966  . Abdominal hysterectomy  2010  . Dilation and curettage of uterus      Family History  Problem Relation Age of Onset  . Diabetes Mother   . COPD Mother   . Diabetes Father   . Hypertension Father   . Cancer Father      Kidney and Prostate  . CAD Father   . Diabetes Brother     Oldest Brother    Social History   Social History  . Marital Status: Single    Spouse Name: N/A  . Number of Children: N/A  . Years of Education: N/A   Occupational History  . Not on file.   Social History Main Topics  . Smoking status: Never Smoker   . Smokeless tobacco: Never Used  . Alcohol Use: No  . Drug Use: No  . Sexual Activity: Not on file   Other Topics Concern  . Not on file   Social History Narrative     Current outpatient prescriptions:  .  aspirin EC 81 MG tablet, Take 81 mg by mouth daily., Disp: , Rfl:  .  fluticasone (FLONASE) 50 MCG/ACT nasal spray, Place 2 sprays into both nostrils as needed., Disp: , Rfl:  .  glucose blood (ONE TOUCH ULTRA TEST) test strip, Use as instructed, Disp: 100 each, Rfl: 12 .  levocetirizine (XYZAL) 5 MG tablet, Take 5 mg by mouth daily. , Disp: , Rfl:  .  magnesium oxide (MAG-OX) 400 MG tablet, Take 400 mg by mouth daily., Disp: , Rfl:  .  Multiple Vitamins-Minerals (MULTIVITAMIN WITH MINERALS) tablet, Take 1 tablet by mouth daily., Disp: , Rfl:  .  omeprazole (PRILOSEC) 40 MG capsule, Take 1 capsule (40 mg total) by mouth daily., Disp: 30 capsule, Rfl: 0 .  simvastatin (ZOCOR) 40 MG tablet, Take 1 tablet  by mouth every evening., Disp: , Rfl:  .  vitamin B-12 (CYANOCOBALAMIN) 1000 MCG tablet, Take 1,000 mcg by mouth daily., Disp: , Rfl:  .  vitamin C (ASCORBIC ACID) 500 MG tablet, Take 500 mg by mouth daily., Disp: , Rfl:   No Known Allergies   ROS  Constitutional: Negative for fever , she lost 8 lbs since last visit   Respiratory: Positive  for cough but negative for  shortness of breath.   Cardiovascular: Negative for chest pain or palpitations.  Gastrointestinal: Positive for abdominal pain, she has flatus , decrease in bowel movement since started hydrocodone Musculoskeletal: Negative for gait problem or joint swelling.  Skin: Negative for rash.   Neurological: Negative for dizziness or headache.  No other specific complaints in a complete review of systems (except as listed in HPI above).  Objective  Filed Vitals:   11/05/14 1144  BP: 136/58  Pulse: 105  Temp: 97.9 F (36.6 C)  TempSrc: Oral  Resp: 16  Height: 5' 8" (1.727 m)  Weight: 216 lb 6.4 oz (98.158 kg)  SpO2: 96%    Body mass index is 32.91 kg/(m^2).  Physical Exam   Constitutional: Patient appears well-developed and well-nourished. Obese No distress.  HEENT: head atraumatic, normocephalic, pupils equal and reactive to light, neck supple, throat within normal limits Cardiovascular: Normal rate, regular rhythm and normal heart sounds.  No murmur heard. No BLE edema. Pulmonary/Chest: Effort normal and breath sounds normal. No respiratory distress. Abdominal: Soft.  Bowel sounds are slightly increased, tender epigastric area, also on RUQ and LUQ, no masses noticed.  Psychiatric: Patient has a normal mood and affect. behavior is normal. Judgment and thought content normal.  Recent Results (from the past 2160 hour(s))  POCT urinalysis dipstick     Status: Abnormal   Collection Time: 10/08/14  2:56 PM  Result Value Ref Range   Color, UA ORANGISH RED    Clarity, UA DARK    Glucose, UA NEGATIVE    Bilirubin, UA NEGATIVE    Ketones, UA NEGATIVE    Spec Grav, UA >=1.030    Blood, UA NEGATIVE    pH, UA 6.0    Protein, UA TRACE    Urobilinogen, UA 1.0    Nitrite, UA NEGATIVE    Leukocytes, UA small (1+) (A) Negative  Urine culture     Status: None   Collection Time: 10/09/14 12:00 AM  Result Value Ref Range   Urine Culture, Routine Final report    Urine Culture result 1 Comment     Comment: Mixed urogenital flora Less than 10,000 colonies/mL   CBC     Status: Abnormal   Collection Time: 10/10/14  3:51 AM  Result Value Ref Range   WBC 13.5 (H) 3.6 - 11.0 K/uL   RBC 3.83 3.80 - 5.20 MIL/uL   Hemoglobin 10.8 (L) 12.0 - 16.0 g/dL   HCT 32.2 (L) 35.0 - 47.0  %   MCV 84.1 80.0 - 100.0 fL   MCH 28.2 26.0 - 34.0 pg   MCHC 33.5 32.0 - 36.0 g/dL   RDW 14.4 11.5 - 14.5 %   Platelets 181 150 - 440 K/uL  Comprehensive metabolic panel     Status: Abnormal   Collection Time: 10/10/14  3:51 AM  Result Value Ref Range   Sodium 134 (L) 135 - 145 mmol/L   Potassium 3.3 (L) 3.5 - 5.1 mmol/L   Chloride 97 (L) 101 - 111 mmol/L   CO2 22 22 - 32 mmol/L  Glucose, Bld 180 (H) 65 - 99 mg/dL   BUN 36 (H) 6 - 20 mg/dL   Creatinine, Ser 2.35 (H) 0.44 - 1.00 mg/dL   Calcium 8.3 (L) 8.9 - 10.3 mg/dL   Total Protein 7.1 6.5 - 8.1 g/dL   Albumin 2.9 (L) 3.5 - 5.0 g/dL   AST 97 (H) 15 - 41 U/L   ALT 95 (H) 14 - 54 U/L   Alkaline Phosphatase 134 (H) 38 - 126 U/L   Total Bilirubin 2.7 (H) 0.3 - 1.2 mg/dL   GFR calc non Af Amer 21 (L) >60 mL/min   GFR calc Af Amer 25 (L) >60 mL/min    Comment: (NOTE) The eGFR has been calculated using the CKD EPI equation. This calculation has not been validated in all clinical situations. eGFR's persistently <60 mL/min signify possible Chronic Kidney Disease.    Anion gap 15 5 - 15  Blood culture (routine x 2)     Status: None   Collection Time: 10/10/14  3:51 AM  Result Value Ref Range   Specimen Description BLOOD LEFT ASSIST CONTROL    Special Requests BOTTLES DRAWN AEROBIC AND ANAEROBIC 1CC    Culture  Setup Time      GRAM POSITIVE COCCI ANAEROBIC BOTTLE ONLY CRITICAL RESULT CALLED TO, READ BACK BY AND VERIFIED WITH: ALISHA RAWLINS AT 9562 10/14/14.PMH CONFIRMED BY RW    Culture      VIRIDANS STREPTOCOCCUS ANAEROBIC BOTTLE ONLY POSSIBLE CONTAMINATION WITH SKIN FLORA    Report Status 10/15/2014 FINAL   Blood culture (routine x 2)     Status: None   Collection Time: 10/10/14  3:51 AM  Result Value Ref Range   Specimen Description BLOOD LEFT HAND    Special Requests BOTTLES DRAWN AEROBIC AND ANAEROBIC 1CC    Culture NO GROWTH 5 DAYS    Report Status 10/15/2014 FINAL   TSH     Status: None   Collection Time:  10/10/14  3:51 AM  Result Value Ref Range   TSH 1.289 0.350 - 4.500 uIU/mL  Hemoglobin A1c     Status: Abnormal   Collection Time: 10/10/14  3:51 AM  Result Value Ref Range   Hgb A1c MFr Bld 7.4 (H) 4.0 - 6.0 %  Lactic acid, plasma     Status: None   Collection Time: 10/10/14  4:09 AM  Result Value Ref Range   Lactic Acid, Venous 1.6 0.5 - 2.0 mmol/L  Urinalysis complete, with microscopic ([facility] only)     Status: Abnormal   Collection Time: 10/10/14  7:16 AM  Result Value Ref Range   Color, Urine AMBER (A) YELLOW   APPearance CLOUDY (A) CLEAR   Glucose, UA NEGATIVE NEGATIVE mg/dL   Bilirubin Urine NEGATIVE NEGATIVE   Ketones, ur NEGATIVE NEGATIVE mg/dL   Specific Gravity, Urine 1.013 1.005 - 1.030   Hgb urine dipstick 1+ (A) NEGATIVE   pH 5.0 5.0 - 8.0   Protein, ur 30 (A) NEGATIVE mg/dL   Nitrite NEGATIVE NEGATIVE   Leukocytes, UA NEGATIVE NEGATIVE   RBC / HPF 0-5 0 - 5 RBC/hpf   WBC, UA 6-30 0 - 5 WBC/hpf   Bacteria, UA RARE (A) NONE SEEN   Squamous Epithelial / LPF 0-5 (A) NONE SEEN   Mucous PRESENT    Hyaline Casts, UA PRESENT    Granular Casts, UA PRESENT    Amorphous Crystal PRESENT   Urine culture     Status: None   Collection Time: 10/10/14  7:16 AM  Result Value Ref Range   Specimen Description URINE, CLEAN CATCH    Special Requests NONE    Culture NO GROWTH 2 DAYS    Report Status 10/12/2014 FINAL   Lactic acid, plasma     Status: None   Collection Time: 10/10/14  8:34 AM  Result Value Ref Range   Lactic Acid, Venous 1.2 0.5 - 2.0 mmol/L  Basic metabolic panel     Status: Abnormal   Collection Time: 10/11/14  4:20 AM  Result Value Ref Range   Sodium 136 135 - 145 mmol/L   Potassium 3.1 (L) 3.5 - 5.1 mmol/L   Chloride 105 101 - 111 mmol/L   CO2 23 22 - 32 mmol/L   Glucose, Bld 177 (H) 65 - 99 mg/dL   BUN 24 (H) 6 - 20 mg/dL   Creatinine, Ser 1.26 (H) 0.44 - 1.00 mg/dL   Calcium 8.1 (L) 8.9 - 10.3 mg/dL   GFR calc non Af Amer 45 (L) >60 mL/min   GFR calc  Af Amer 53 (L) >60 mL/min    Comment: (NOTE) The eGFR has been calculated using the CKD EPI equation. This calculation has not been validated in all clinical situations. eGFR's persistently <60 mL/min signify possible Chronic Kidney Disease.    Anion gap 8 5 - 15  CBC     Status: Abnormal   Collection Time: 10/11/14  4:20 AM  Result Value Ref Range   WBC 9.7 3.6 - 11.0 K/uL   RBC 3.67 (L) 3.80 - 5.20 MIL/uL   Hemoglobin 10.4 (L) 12.0 - 16.0 g/dL   HCT 31.3 (L) 35.0 - 47.0 %   MCV 85.3 80.0 - 100.0 fL   MCH 28.3 26.0 - 34.0 pg   MCHC 33.1 32.0 - 36.0 g/dL   RDW 14.7 (H) 11.5 - 14.5 %   Platelets 176 150 - 440 K/uL  Glucose, capillary     Status: Abnormal   Collection Time: 10/11/14  4:22 PM  Result Value Ref Range   Glucose-Capillary 156 (H) 65 - 99 mg/dL   Comment 1 Notify RN   Glucose, capillary     Status: Abnormal   Collection Time: 10/11/14  7:32 PM  Result Value Ref Range   Glucose-Capillary 188 (H) 65 - 99 mg/dL  Basic metabolic panel     Status: Abnormal   Collection Time: 10/12/14  4:44 AM  Result Value Ref Range   Sodium 140 135 - 145 mmol/L   Potassium 3.2 (L) 3.5 - 5.1 mmol/L   Chloride 103 101 - 111 mmol/L   CO2 29 22 - 32 mmol/L   Glucose, Bld 152 (H) 65 - 99 mg/dL   BUN 13 6 - 20 mg/dL   Creatinine, Ser 0.90 0.44 - 1.00 mg/dL   Calcium 8.6 (L) 8.9 - 10.3 mg/dL   GFR calc non Af Amer >60 >60 mL/min   GFR calc Af Amer >60 >60 mL/min    Comment: (NOTE) The eGFR has been calculated using the CKD EPI equation. This calculation has not been validated in all clinical situations. eGFR's persistently <60 mL/min signify possible Chronic Kidney Disease.    Anion gap 8 5 - 15  Magnesium     Status: None   Collection Time: 10/12/14  4:44 AM  Result Value Ref Range   Magnesium 2.0 1.7 - 2.4 mg/dL  CBC     Status: Abnormal   Collection Time: 10/12/14  4:44 AM  Result Value Ref Range   WBC 13.0 (H)  3.6 - 11.0 K/uL   RBC 3.64 (L) 3.80 - 5.20 MIL/uL   Hemoglobin  10.5 (L) 12.0 - 16.0 g/dL   HCT 30.9 (L) 35.0 - 47.0 %   MCV 84.9 80.0 - 100.0 fL   MCH 28.8 26.0 - 34.0 pg   MCHC 33.9 32.0 - 36.0 g/dL   RDW 14.6 (H) 11.5 - 14.5 %   Platelets 212 150 - 440 K/uL  Hemoglobin A1c     Status: Abnormal   Collection Time: 10/12/14  4:44 AM  Result Value Ref Range   Hgb A1c MFr Bld 6.9 (H) 4.0 - 6.0 %  Glucose, capillary     Status: Abnormal   Collection Time: 10/12/14  7:54 AM  Result Value Ref Range   Glucose-Capillary 175 (H) 65 - 99 mg/dL  Glucose, capillary     Status: None   Collection Time: 10/22/14  1:09 PM  Result Value Ref Range   Glucose-Capillary 85 65 - 99 mg/dL  Comprehensive metabolic panel     Status: Abnormal   Collection Time: 10/22/14  1:10 PM  Result Value Ref Range   Sodium 132 (L) 135 - 145 mmol/L   Potassium 4.1 3.5 - 5.1 mmol/L   Chloride 96 (L) 101 - 111 mmol/L   CO2 23 22 - 32 mmol/L   Glucose, Bld 104 (H) 65 - 99 mg/dL   BUN 29 (H) 6 - 20 mg/dL   Creatinine, Ser 2.21 (H) 0.44 - 1.00 mg/dL   Calcium 9.2 8.9 - 10.3 mg/dL   Total Protein 8.7 (H) 6.5 - 8.1 g/dL   Albumin 3.6 3.5 - 5.0 g/dL   AST 36 15 - 41 U/L   ALT 43 14 - 54 U/L   Alkaline Phosphatase 129 (H) 38 - 126 U/L   Total Bilirubin 1.0 0.3 - 1.2 mg/dL   GFR calc non Af Amer 23 (L) >60 mL/min   GFR calc Af Amer 27 (L) >60 mL/min    Comment: (NOTE) The eGFR has been calculated using the CKD EPI equation. This calculation has not been validated in all clinical situations. eGFR's persistently <60 mL/min signify possible Chronic Kidney Disease.    Anion gap 13 5 - 15  CBC with Differential     Status: Abnormal   Collection Time: 10/22/14  1:10 PM  Result Value Ref Range   WBC 14.7 (H) 3.6 - 11.0 K/uL   RBC 4.17 3.80 - 5.20 MIL/uL   Hemoglobin 11.5 (L) 12.0 - 16.0 g/dL   HCT 35.8 35.0 - 47.0 %   MCV 85.9 80.0 - 100.0 fL   MCH 27.6 26.0 - 34.0 pg   MCHC 32.2 32.0 - 36.0 g/dL   RDW 14.7 (H) 11.5 - 14.5 %   Platelets 415 150 - 440 K/uL   Neutrophils  Relative % 76 %   Neutro Abs 11.1 (H) 1.4 - 6.5 K/uL   Lymphocytes Relative 14 %   Lymphs Abs 2.1 1.0 - 3.6 K/uL   Monocytes Relative 10 %   Monocytes Absolute 1.5 (H) 0.2 - 0.9 K/uL   Eosinophils Relative 0 %   Eosinophils Absolute 0.1 0 - 0.7 K/uL   Basophils Relative 0 %   Basophils Absolute 0.0 0 - 0.1 K/uL  Culture, blood (routine x 2)     Status: None   Collection Time: 10/22/14  1:10 PM  Result Value Ref Range   Specimen Description BLOOD LEFT ARM    Special Requests BOTTLES DRAWN AEROBIC AND ANAEROBIC 3CC  Culture NO GROWTH 5 DAYS    Report Status 10/27/2014 FINAL   Culture, blood (routine x 2)     Status: None   Collection Time: 10/22/14  1:10 PM  Result Value Ref Range   Specimen Description BLOOD RIGHT HAND    Special Requests BOTTLES DRAWN AEROBIC AND ANAEROBIC  1CC    Culture NO GROWTH 5 DAYS    Report Status 10/27/2014 FINAL   Troponin I     Status: None   Collection Time: 10/22/14  1:10 PM  Result Value Ref Range   Troponin I <0.03 <0.031 ng/mL    Comment:        NO INDICATION OF MYOCARDIAL INJURY.   Lactic acid, plasma     Status: None   Collection Time: 10/22/14  1:20 PM  Result Value Ref Range   Lactic Acid, Venous 1.9 0.5 - 2.0 mmol/L  POCT Glucose (CBG)     Status: Abnormal   Collection Time: 10/22/14  1:32 PM  Result Value Ref Range   POC Glucose 108 (A) 70 - 99 mg/dl  Urinalysis complete, with microscopic ([facility] only)     Status: Abnormal   Collection Time: 10/22/14  3:02 PM  Result Value Ref Range   Color, Urine STRAW (A) YELLOW   APPearance CLEAR (A) CLEAR   Glucose, UA NEGATIVE NEGATIVE mg/dL   Bilirubin Urine NEGATIVE NEGATIVE   Ketones, ur NEGATIVE NEGATIVE mg/dL   Specific Gravity, Urine 1.002 (L) 1.005 - 1.030   Hgb urine dipstick 2+ (A) NEGATIVE   pH 7.0 5.0 - 8.0   Protein, ur NEGATIVE NEGATIVE mg/dL   Nitrite NEGATIVE NEGATIVE   Leukocytes, UA NEGATIVE NEGATIVE   RBC / HPF 0-5 0 - 5 RBC/hpf   WBC, UA 0-5 0 - 5 WBC/hpf    Bacteria, UA NONE SEEN NONE SEEN   Squamous Epithelial / LPF 0-5 (A) NONE SEEN  Glucose, capillary     Status: Abnormal   Collection Time: 10/22/14  5:01 PM  Result Value Ref Range   Glucose-Capillary 120 (H) 65 - 99 mg/dL  Magnesium     Status: None   Collection Time: 10/22/14  5:09 PM  Result Value Ref Range   Magnesium 1.9 1.7 - 2.4 mg/dL  Glucose, capillary     Status: None   Collection Time: 10/22/14  9:48 PM  Result Value Ref Range   Glucose-Capillary 96 65 - 99 mg/dL   Comment 1 Notify RN   Basic metabolic panel     Status: Abnormal   Collection Time: 10/23/14  5:07 AM  Result Value Ref Range   Sodium 139 135 - 145 mmol/L   Potassium 4.3 3.5 - 5.1 mmol/L   Chloride 106 101 - 111 mmol/L   CO2 26 22 - 32 mmol/L   Glucose, Bld 123 (H) 65 - 99 mg/dL   BUN 20 6 - 20 mg/dL   Creatinine, Ser 1.39 (H) 0.44 - 1.00 mg/dL   Calcium 8.9 8.9 - 10.3 mg/dL   GFR calc non Af Amer 40 (L) >60 mL/min   GFR calc Af Amer 47 (L) >60 mL/min    Comment: (NOTE) The eGFR has been calculated using the CKD EPI equation. This calculation has not been validated in all clinical situations. eGFR's persistently <60 mL/min signify possible Chronic Kidney Disease.    Anion gap 7 5 - 15  CBC     Status: Abnormal   Collection Time: 10/23/14  5:07 AM  Result Value Ref Range  WBC 8.6 3.6 - 11.0 K/uL   RBC 3.96 3.80 - 5.20 MIL/uL   Hemoglobin 11.2 (L) 12.0 - 16.0 g/dL   HCT 34.1 (L) 35.0 - 47.0 %   MCV 86.1 80.0 - 100.0 fL   MCH 28.4 26.0 - 34.0 pg   MCHC 33.0 32.0 - 36.0 g/dL   RDW 14.9 (H) 11.5 - 14.5 %   Platelets 374 150 - 440 K/uL  Glucose, capillary     Status: Abnormal   Collection Time: 10/23/14  7:27 AM  Result Value Ref Range   Glucose-Capillary 116 (H) 65 - 99 mg/dL   Comment 1 Notify RN   Glucose, capillary     Status: None   Collection Time: 10/23/14 11:21 AM  Result Value Ref Range   Glucose-Capillary 76 65 - 99 mg/dL   Comment 1 Notify RN   Glucose, capillary     Status:  Abnormal   Collection Time: 10/23/14  4:42 PM  Result Value Ref Range   Glucose-Capillary 101 (H) 65 - 99 mg/dL   Comment 1 Notify RN   Glucose, capillary     Status: Abnormal   Collection Time: 10/23/14  9:23 PM  Result Value Ref Range   Glucose-Capillary 105 (H) 65 - 99 mg/dL  Cortisol     Status: None   Collection Time: 10/24/14  5:43 AM  Result Value Ref Range   Cortisol, Plasma 21.2 ug/dL    Comment: (NOTE) AM    6.7 - 22.6 ug/dL PM   <10.0       ug/dL Performed at [facility]   CBC     Status: Abnormal   Collection Time: 10/24/14  5:44 AM  Result Value Ref Range   WBC 5.7 3.6 - 11.0 K/uL   RBC 3.92 3.80 - 5.20 MIL/uL   Hemoglobin 11.1 (L) 12.0 - 16.0 g/dL   HCT 33.8 (L) 35.0 - 47.0 %   MCV 86.2 80.0 - 100.0 fL   MCH 28.2 26.0 - 34.0 pg   MCHC 32.7 32.0 - 36.0 g/dL   RDW 14.7 (H) 11.5 - 14.5 %   Platelets 340 150 - 440 K/uL  Basic metabolic panel     Status: Abnormal   Collection Time: 10/24/14  5:44 AM  Result Value Ref Range   Sodium 142 135 - 145 mmol/L   Potassium 4.3 3.5 - 5.1 mmol/L   Chloride 110 101 - 111 mmol/L   CO2 26 22 - 32 mmol/L   Glucose, Bld 117 (H) 65 - 99 mg/dL   BUN 12 6 - 20 mg/dL   Creatinine, Ser 1.10 (H) 0.44 - 1.00 mg/dL   Calcium 9.0 8.9 - 10.3 mg/dL   GFR calc non Af Amer 53 (L) >60 mL/min   GFR calc Af Amer >60 >60 mL/min    Comment: (NOTE) The eGFR has been calculated using the CKD EPI equation. This calculation has not been validated in all clinical situations. eGFR's persistently <60 mL/min signify possible Chronic Kidney Disease.    Anion gap 6 5 - 15  Glucose, capillary     Status: Abnormal   Collection Time: 10/24/14  7:32 AM  Result Value Ref Range   Glucose-Capillary 111 (H) 65 - 99 mg/dL   Comment 1 Notify RN   Glucose, capillary     Status: Abnormal   Collection Time: 10/24/14 11:47 AM  Result Value Ref Range   Glucose-Capillary 114 (H) 65 - 99 mg/dL   Comment 1 Notify RN  Glucose, capillary     Status:  None   Collection Time: 10/24/14  4:35 PM  Result Value Ref Range   Glucose-Capillary 86 65 - 99 mg/dL   Comment 1 Notify RN   Glucose, capillary     Status: None   Collection Time: 10/24/14  9:25 PM  Result Value Ref Range   Glucose-Capillary 82 65 - 99 mg/dL   Comment 1 Notify RN   CBC     Status: Abnormal   Collection Time: 10/25/14  7:39 AM  Result Value Ref Range   WBC 6.2 3.6 - 11.0 K/uL   RBC 3.99 3.80 - 5.20 MIL/uL   Hemoglobin 11.2 (L) 12.0 - 16.0 g/dL   HCT 34.2 (L) 35.0 - 47.0 %   MCV 85.9 80.0 - 100.0 fL   MCH 28.2 26.0 - 34.0 pg   MCHC 32.8 32.0 - 36.0 g/dL   RDW 14.3 11.5 - 14.5 %   Platelets 362 150 - 440 K/uL  Basic metabolic panel     Status: Abnormal   Collection Time: 10/25/14  7:39 AM  Result Value Ref Range   Sodium 140 135 - 145 mmol/L   Potassium 4.3 3.5 - 5.1 mmol/L   Chloride 107 101 - 111 mmol/L   CO2 25 22 - 32 mmol/L   Glucose, Bld 115 (H) 65 - 99 mg/dL   BUN 10 6 - 20 mg/dL   Creatinine, Ser 0.86 0.44 - 1.00 mg/dL   Calcium 9.4 8.9 - 10.3 mg/dL   GFR calc non Af Amer >60 >60 mL/min   GFR calc Af Amer >60 >60 mL/min    Comment: (NOTE) The eGFR has been calculated using the CKD EPI equation. This calculation has not been validated in all clinical situations. eGFR's persistently <60 mL/min signify possible Chronic Kidney Disease.    Anion gap 8 5 - 15  Glucose, capillary     Status: Abnormal   Collection Time: 10/25/14  7:50 AM  Result Value Ref Range   Glucose-Capillary 113 (H) 65 - 99 mg/dL   Comment 1 Notify RN       PHQ2/9: Depression screen PHQ 2/9 10/08/2014  Decreased Interest 0  Down, Depressed, Hopeless 0  PHQ - 2 Score 0     Fall Risk: Fall Risk  10/08/2014  Falls in the past year? No     Assessment & Plan  1. Abdominal pain, unspecified abdominal location Recently hospitalized, discussed possible : gastritis, gastric ulcer, biliary cholic, pancreatitis, partial small bowel obstruction, resume PPI , try Zantac, maalox  or Tums otc, bland diet, check some studies and return 24- 48 hours - US Abdomen Complete; Future - CBC with Differential/Platelet - Comprehensive metabolic panel - Lipase  2. Need for vaccination for H flu type B  - Flu Vaccine QUAD 36+ mos PF IM (Fluarix & Fluzone Quad PF) - refused, will get it at work   3. Indigestion  - H. pylori antibody, IgG - omeprazole (PRILOSEC) 40 MG capsule; Take 1 capsule (40 mg total) by mouth daily.  Dispense: 30 capsule; Refill: 0  4. Cough  - DG Chest 2 View; Future

## 2014-11-06 ENCOUNTER — Ambulatory Visit (INDEPENDENT_AMBULATORY_CARE_PROVIDER_SITE_OTHER): Payer: BC Managed Care – PPO | Admitting: General Surgery

## 2014-11-06 ENCOUNTER — Other Ambulatory Visit: Payer: Self-pay

## 2014-11-06 ENCOUNTER — Other Ambulatory Visit: Payer: Self-pay | Admitting: *Deleted

## 2014-11-06 ENCOUNTER — Encounter: Payer: Self-pay | Admitting: General Surgery

## 2014-11-06 ENCOUNTER — Ambulatory Visit: Payer: BC Managed Care – PPO | Admitting: Family Medicine

## 2014-11-06 ENCOUNTER — Ambulatory Visit
Admission: RE | Admit: 2014-11-06 | Discharge: 2014-11-06 | Disposition: A | Discharge status: Home / Self Care | Payer: BC Managed Care – PPO | Source: Ambulatory Visit | Attending: Family Medicine | Admitting: Family Medicine

## 2014-11-06 VITALS — BP 142/62 | HR 104 | Temp 100.5°F | Resp 14 | Ht 69.0 in | Wt 215.0 lb

## 2014-11-06 DIAGNOSIS — R1084 Generalized abdominal pain: Secondary | ICD-10-CM

## 2014-11-06 DIAGNOSIS — K760 Fatty (change of) liver, not elsewhere classified: Secondary | ICD-10-CM | POA: Insufficient documentation

## 2014-11-06 DIAGNOSIS — R1013 Epigastric pain: Secondary | ICD-10-CM

## 2014-11-06 DIAGNOSIS — R109 Unspecified abdominal pain: Secondary | ICD-10-CM | POA: Diagnosis present

## 2014-11-06 DIAGNOSIS — D72829 Elevated white blood cell count, unspecified: Secondary | ICD-10-CM

## 2014-11-06 DIAGNOSIS — R5081 Fever presenting with conditions classified elsewhere: Secondary | ICD-10-CM | POA: Diagnosis not present

## 2014-11-06 DIAGNOSIS — K8020 Calculus of gallbladder without cholecystitis without obstruction: Secondary | ICD-10-CM

## 2014-11-06 LAB — CBC WITH DIFFERENTIAL/PLATELET
BASOS: 0 %
Basophils Absolute: 0 10*3/uL (ref 0.0–0.2)
EOS (ABSOLUTE): 0.1 10*3/uL (ref 0.0–0.4)
EOS: 1 %
HEMATOCRIT: 35.2 % (ref 34.0–46.6)
Hemoglobin: 11.7 g/dL (ref 11.1–15.9)
IMMATURE GRANS (ABS): 0 10*3/uL (ref 0.0–0.1)
IMMATURE GRANULOCYTES: 0 %
LYMPHS: 15 %
Lymphocytes Absolute: 2.3 10*3/uL (ref 0.7–3.1)
MCH: 27.7 pg (ref 26.6–33.0)
MCHC: 33.2 g/dL (ref 31.5–35.7)
MCV: 83 fL (ref 79–97)
MONOS ABS: 1.7 10*3/uL — AB (ref 0.1–0.9)
Monocytes: 11 %
NEUTROS PCT: 73 %
Neutrophils Absolute: 11.2 10*3/uL — ABNORMAL HIGH (ref 1.4–7.0)
PLATELETS: 308 10*3/uL (ref 150–379)
RBC: 4.23 x10E6/uL (ref 3.77–5.28)
RDW: 15 % (ref 12.3–15.4)
WBC: 15.3 10*3/uL — AB (ref 3.4–10.8)

## 2014-11-06 LAB — COMPREHENSIVE METABOLIC PANEL
ALBUMIN: 3.9 g/dL (ref 3.6–4.8)
ALT: 29 IU/L (ref 0–32)
AST: 17 IU/L (ref 0–40)
Albumin/Globulin Ratio: 1 — ABNORMAL LOW (ref 1.1–2.5)
Alkaline Phosphatase: 123 IU/L — ABNORMAL HIGH (ref 39–117)
BUN / CREAT RATIO: 12 (ref 11–26)
BUN: 9 mg/dL (ref 8–27)
Bilirubin Total: 0.7 mg/dL (ref 0.0–1.2)
CALCIUM: 9.8 mg/dL (ref 8.7–10.3)
CO2: 22 mmol/L (ref 18–29)
CREATININE: 0.77 mg/dL (ref 0.57–1.00)
Chloride: 96 mmol/L — ABNORMAL LOW (ref 97–108)
GFR, EST AFRICAN AMERICAN: 97 mL/min/{1.73_m2} (ref >59)
GFR, EST NON AFRICAN AMERICAN: 84 mL/min/{1.73_m2} (ref >59)
GLOBULIN, TOTAL: 3.9 g/dL (ref 1.5–4.5)
GLUCOSE: 107 mg/dL — AB (ref 65–99)
Potassium: 5.3 mmol/L — ABNORMAL HIGH (ref 3.5–5.2)
SODIUM: 139 mmol/L (ref 134–144)
TOTAL PROTEIN: 7.8 g/dL (ref 6.0–8.5)

## 2014-11-06 LAB — H. PYLORI ANTIBODY, IGG

## 2014-11-06 LAB — LIPASE: Lipase: 29 U/L (ref 0–59)

## 2014-11-06 MED ORDER — AMOXICILLIN-POT CLAVULANATE 875-125 MG PO TABS: 1.0000 | ORAL_TABLET | Freq: Two times a day (BID) | ORAL | Status: AC

## 2014-11-06 NOTE — Progress Notes (Signed)
Patient to see Dr. Bary Castilla at 12 noon today. She has been asked not to eat or drink anything else before her appointment.

## 2014-11-06 NOTE — Progress Notes (Signed)
Patient ID: Emily Pitts, female   DOB: April 23, 1955, 59 y.o.   MRN: [id number]  Chief Complaint  Patient presents with  . Abdominal Pain    evaluate gallstones    HPI MELISHA Pitts is a 59 y.o. female.  Here for evaluation of gallstones. She states she started hurting upper abdomen last Thursday. The pain radiates to the right back. Admits to nausea but no vomiting. The nausea is worse when the pain is worse. Low grade fever yesterday 99.1. She tried an antiacid yesterday that didn't seem to help. She took Nexium Saturday and Sunday,. She has never had an episode of pain like this before. Poor appetite for about a month. At first the pain was with meals now it is all the time. No particular foods. No BM since Saturday and she normally goes daily. States this morning her urine looked dark/bloody. She has recently been hospitalized for kidney infection August 3 and August 15. She has been having increased belching. FSBS yesterday morning was 112. Abdominal ultrasound was 11-06-14.  HPI  Past Medical History  Diagnosis Date  . Hypertension   . Hyperlipidemia   . Allergy   . Diabetes mellitus without complication 4196  . Vitamin D deficiency   . Numbness of feet   . Low back pain, episodic     Past Surgical History  Procedure Laterality Date  . Appendectomy  1966  . Abdominal hysterectomy  2010  . Dilation and curettage of uterus      Family History  Problem Relation Age of Onset  . Diabetes Mother   . COPD Mother   . Diabetes Father   . Hypertension Father   . Cancer Father     Kidney and Prostate  . CAD Father   . Diabetes Brother     Oldest Brother    Social History Social History  Substance Use Topics  . Smoking status: Never Smoker   . Smokeless tobacco: Never Used  . Alcohol Use: No    No Known Allergies  Current Outpatient Prescriptions  Medication Sig Dispense Refill  . aspirin EC 81 MG tablet Take 81 mg by mouth daily.    . fluticasone (FLONASE) 50 MCG/ACT  nasal spray Place 2 sprays into both nostrils as needed.    Marland Kitchen glucose blood (ONE TOUCH ULTRA TEST) test strip Use as instructed 100 each 12  . HYDROcodone-acetaminophen (NORCO) 10-325 MG per tablet Take 1 tablet by mouth 2 (two) times daily. 20 tablet 0  . levocetirizine (XYZAL) 5 MG tablet Take 5 mg by mouth daily.     . magnesium oxide (MAG-OX) 400 MG tablet Take 400 mg by mouth daily.    . Multiple Vitamins-Minerals (MULTIVITAMIN WITH MINERALS) tablet Take 1 tablet by mouth daily.    Marland Kitchen omeprazole (PRILOSEC) 40 MG capsule Take 1 capsule (40 mg total) by mouth daily. 30 capsule 0  . simvastatin (ZOCOR) 40 MG tablet Take 1 tablet by mouth every evening.    . vitamin B-12 (CYANOCOBALAMIN) 1000 MCG tablet Take 1,000 mcg by mouth daily.    . vitamin C (ASCORBIC ACID) 500 MG tablet Take 500 mg by mouth daily.    Marland Kitchen amoxicillin-clavulanate (AUGMENTIN) 875-125 MG per tablet Take 1 tablet by mouth 2 (two) times daily. 20 tablet 0   No current facility-administered medications for this visit.    Review of Systems Review of Systems  Constitutional: Positive for fever. Negative for chills and diaphoresis. Unexpected weight change: weight loss since initial hospitalization.  HENT: Negative.   Eyes: Negative.   Respiratory: Negative.   Cardiovascular: Negative.   Gastrointestinal: Positive for abdominal pain and constipation. Negative for abdominal distention.  Endocrine: Negative for cold intolerance.  Genitourinary: Negative for frequency, flank pain and difficulty urinating.  Allergic/Immunologic: Negative.   Neurological: Negative.  Negative for dizziness.  Hematological: Negative.   Psychiatric/Behavioral: Negative.     Blood pressure 142/62, pulse 104, temperature 100.5 F (38.1 C), temperature source Oral, resp. rate 14, height 5' 9"  (1.753 m), weight 215 lb (97.523 kg).  Physical Exam Physical Exam  Constitutional: She is oriented to person, place, and time. She appears well-developed  and well-nourished.  HENT:  Mouth/Throat: Oropharynx is clear and moist. No oropharyngeal exudate.  Eyes: Conjunctivae and EOM are normal. No scleral icterus.  Neck: Neck supple. No tracheal deviation present. No thyromegaly present.  Cardiovascular: Normal rate, regular rhythm and normal heart sounds.   Pulses:      Femoral pulses are 2+ on the right side, and 2+ on the left side.      Dorsalis pedis pulses are 2+ on the right side, and 2+ on the left side.       Posterior tibial pulses are 2+ on the right side, and 2+ on the left side.  No lower leg edema.  Pulmonary/Chest: Effort normal and breath sounds normal. No respiratory distress. She has no wheezes.  Nonfocal abdominal discomfort with deep inspiration.  Abdominal: Soft. Normal appearance and bowel sounds are normal. She exhibits no mass. There is no hepatosplenomegaly. There is tenderness in the epigastric area. There is no rebound and no guarding.    Genitourinary: Rectum normal. Guaiac negative stool.  Musculoskeletal: Normal range of motion.  Lymphadenopathy:    She has no cervical adenopathy.       Right: No inguinal adenopathy present.       Left: No inguinal adenopathy present.  Neurological: She is alert and oriented to person, place, and time. No cranial nerve deficit. Coordination normal.  Skin: Skin is warm and dry. She is not diaphoretic.  Psychiatric: She has a normal mood and affect. Her behavior is normal. Judgment and thought content normal.    Data Reviewed EMR from August 2-08/29/2016 reviewed.  Initial urine sample of August 2 showed less than 10,000 colony-forming units, mixed urogenital flora. First urine culture in the hospital was negative. ( Cipro had been started after the August 2 sample).  Leukocytosis during initial hospitalization. Normal serum lactate.  Second admission August 15 with hypotension thought secondary to ongoing antihypertensives medications in light of decreased oral intake. Urine  culture negative.  CT scan of 10/10/2014 showed no acute findings. Small hiatal hernia, colonic diverticulosis. No inflammatory process surrounding the kidneys.  Abdominal ultrasound completed earlier today showed nonmobile sludge and suspected adherent stones. No gallbladder wall thickening. Normal common bile duct.  Laboratory studies obtained yesterday showed a leukocytosis with a white blood cell count of 15,000.  Assessment    Abdominal pain without clear source.    Plan    The patient tolerated a fairly substantial breakfast this morning without exacerbation of her pain. This included eggs, bacon, hashbrowns. I think it is unlikely that the gallbladder is the source of her present symptoms.  We'll arrange for an outpatient CT of the abdomen and pelvis with oral and IV contrast.  The patient was encouraged to start the Prilosec provided yesterday by Dr. Ancil Boozer taking 2 tablets today and then a daily morning tablet starting tomorrow.  Stool is  heme negative, and she reports no bowel movement for 3 days likely secondary to decreased oral intake as well as the use of hydrocodone 10/325. She's been encouraged to minimize narcotic use unless necessary. Tylenol was acceptable.    Abdominal CT scan with contrast Urine culture and U/A Augmentin 875 mg BID # 20 Increase fluid intake 1 quart daily Continue greek yogurt.  Patient has been scheduled for a CT abdomen/pelvis with contrast at [facility] for 11-07-14 at 8 am (arrive 7:45 am). Prep: NPO after midnight, pick up prep kit, and take medication list. Patient verbalizes understanding.     PCP:  Frazier Butt 11/06/2014, 12:44 PM    More than 50% of the visit was spent reviewing hospital/office records, CT scans, laboratory studies.

## 2014-11-06 NOTE — Progress Notes (Signed)
Patient notified and will be seeing surgeon today

## 2014-11-06 NOTE — Patient Instructions (Addendum)
The patient is aware to call back for any questions or concerns. Abdominal CT scan with contrast Increase fluid intake 1 quart daily Continue greek yogurt  Patient has been scheduled for a CT abdomen/pelvis with contrast at [facility] for 11-07-14 at 8 am (arrive 7:45 am). Prep: NPO after midnight, pick up prep kit, and take medication list. Patient verbalizes understanding.

## 2014-11-07 ENCOUNTER — Telehealth: Payer: Self-pay | Admitting: *Deleted

## 2014-11-07 ENCOUNTER — Ambulatory Visit (INDEPENDENT_AMBULATORY_CARE_PROVIDER_SITE_OTHER): Payer: BC Managed Care – PPO | Admitting: General Surgery

## 2014-11-07 ENCOUNTER — Encounter: Payer: Self-pay | Admitting: General Surgery

## 2014-11-07 ENCOUNTER — Ambulatory Visit
Admission: RE | Admit: 2014-11-07 | Discharge: 2014-11-07 | Disposition: A | Discharge status: Home / Self Care | Payer: BC Managed Care – PPO | Source: Ambulatory Visit | Attending: General Surgery | Admitting: General Surgery

## 2014-11-07 VITALS — BP 122/62 | HR 100 | Temp 98.2°F | Resp 14 | Ht 69.0 in | Wt 216.0 lb

## 2014-11-07 DIAGNOSIS — R938 Abnormal findings on diagnostic imaging of other specified body structures: Secondary | ICD-10-CM | POA: Insufficient documentation

## 2014-11-07 DIAGNOSIS — R1084 Generalized abdominal pain: Secondary | ICD-10-CM | POA: Insufficient documentation

## 2014-11-07 DIAGNOSIS — K5790 Diverticulosis of intestine, part unspecified, without perforation or abscess without bleeding: Secondary | ICD-10-CM | POA: Diagnosis not present

## 2014-11-07 DIAGNOSIS — I81 Portal vein thrombosis: Secondary | ICD-10-CM | POA: Diagnosis not present

## 2014-11-07 DIAGNOSIS — R1013 Epigastric pain: Secondary | ICD-10-CM

## 2014-11-07 LAB — MICROSCOPIC EXAMINATION: CASTS: NONE SEEN /LPF

## 2014-11-07 LAB — URINALYSIS, COMPLETE
Bilirubin, UA: NEGATIVE
GLUCOSE, UA: NEGATIVE
Leukocytes, UA: NEGATIVE
NITRITE UA: POSITIVE — AB
PH UA: 6 (ref 5.0–7.5)
RBC, UA: NEGATIVE
Specific Gravity, UA: 1.028 (ref 1.005–1.030)
Urobilinogen, Ur: 1 mg/dL (ref 0.2–1.0)

## 2014-11-07 LAB — URINE CULTURE

## 2014-11-07 MED ORDER — IOHEXOL 350 MG/ML SOLN
100.0000 mL | Freq: Once | INTRAVENOUS | Status: AC | PRN
  Administered 2014-11-07: 100 mL via INTRAVENOUS

## 2014-11-07 NOTE — Telephone Encounter (Signed)
Patient notified as instructed and verbalizes understanding. She will try and stop by the lab later today.

## 2014-11-07 NOTE — Telephone Encounter (Signed)
-----   Message from Robert Bellow, MD sent at 11/07/2014  9:47 AM EDT ----- Please notify the patient that the CT reviewed did show a small blood clot in the vein of her liver. I need her to have some additional blood work done, preferably today. Orders in for the coagulation profile. Thank you

## 2014-11-07 NOTE — Progress Notes (Signed)
Patient ID: Emily Pitts, female   DOB: [birth year], 59 y.o.   MRN: [id number]  Chief Complaint  Patient presents with  . Follow-up    HPI Emily Pitts is a 59 y.o. female.  Here today for follow up from CT this morning. She states she did have chills last night. FSBS this morning was 172. She states she is feeling better this morning and pain is less. She has had a cough for about a month but seems to be worse over the past 2 weeks.  HPI  Past Medical History  Diagnosis Date  . Hypertension   . Hyperlipidemia   . Allergy   . Diabetes mellitus without complication 1478  . Vitamin D deficiency   . Numbness of feet   . Low back pain, episodic     Past Surgical History  Procedure Laterality Date  . Appendectomy  1966  . Abdominal hysterectomy  2010  . Dilation and curettage of uterus      Family History  Problem Relation Age of Onset  . Diabetes Mother   . COPD Mother   . Diabetes Father   . Hypertension Father   . Cancer Father     Kidney and Prostate  . CAD Father   . Diabetes Brother     Oldest Brother    Social History Social History  Substance Use Topics  . Smoking status: Never Smoker   . Smokeless tobacco: Never Used  . Alcohol Use: No    No Known Allergies  Current Outpatient Prescriptions  Medication Sig Dispense Refill  . amoxicillin-clavulanate (AUGMENTIN) 875-125 MG per tablet Take 1 tablet by mouth 2 (two) times daily. 20 tablet 0  . aspirin EC 81 MG tablet Take 81 mg by mouth daily.    . fluticasone (FLONASE) 50 MCG/ACT nasal spray Place 2 sprays into both nostrils as needed.    Marland Kitchen glucose blood (ONE TOUCH ULTRA TEST) test strip Use as instructed 100 each 12  . HYDROcodone-acetaminophen (NORCO) 10-325 MG per tablet Take 1 tablet by mouth 2 (two) times daily. 20 tablet 0  . levocetirizine (XYZAL) 5 MG tablet Take 5 mg by mouth daily.     . magnesium oxide (MAG-OX) 400 MG tablet Take 400 mg by mouth daily.    . Multiple Vitamins-Minerals  (MULTIVITAMIN WITH MINERALS) tablet Take 1 tablet by mouth daily.    Marland Kitchen omeprazole (PRILOSEC) 40 MG capsule Take 1 capsule (40 mg total) by mouth daily. 30 capsule 0  . simvastatin (ZOCOR) 40 MG tablet Take 1 tablet by mouth every evening.    . vitamin B-12 (CYANOCOBALAMIN) 1000 MCG tablet Take 1,000 mcg by mouth daily.    . vitamin C (ASCORBIC ACID) 500 MG tablet Take 500 mg by mouth daily.     No current facility-administered medications for this visit.    Review of Systems Review of Systems  Constitutional: Positive for chills.  Respiratory: Positive for cough.   Cardiovascular: Negative.     Blood pressure 122/62, pulse 100, temperature 98.2 F (36.8 C), temperature source Oral, resp. rate 14, height 5\' 9"  (1.753 m), weight 216 lb (97.977 kg).  Physical Exam Physical Exam  Constitutional: She is oriented to person, place, and time. She appears well-developed and well-nourished.  HENT:  Mouth/Throat: Oropharynx is clear and moist.  Eyes: Conjunctivae are normal. No scleral icterus.  Neck: Neck supple.  Cardiovascular: Normal rate, regular rhythm and normal heart sounds.   Pulmonary/Chest: Effort normal and breath sounds normal.  Abdominal:  Soft. Normal appearance. There is no tenderness.  Lymphadenopathy:    She has no cervical adenopathy.  Neurological: She is alert and oriented to person, place, and time.  Skin: Skin is warm and dry.  Psychiatric: Her behavior is normal.    Data Reviewed CT reviewed prior to phone report was unremarkable. Normal arterial vasculature. No evidence of acute cholecystitis. No evidence of colitis.  Phone report received post visit: Partial portal vein thrombosis.  Assessment    Improved abdominal pain, no clear evidence of urinary tract infection. No past evidence of pyelonephritis.  New findings of partial portal vein thrombosis on contrast-enhanced CT.    Plan    Will discuss with vascular surgery for recommendations. At present,  she'll continue her Prilosec and Augmentin.  The patient is a candidate for oral anticoagulation on informal consultation with vascular surgery..  Coagulation workup is appropriate on informal consultation with hematology. Coagulation profile panel ordered.    Continue antibiotics. Follow up next week. She will call for worsening symptoms.  PCP:  Frazier Butt 11/07/2014, 9:17 AM

## 2014-11-08 ENCOUNTER — Encounter: Payer: Self-pay | Admitting: General Surgery

## 2014-11-08 ENCOUNTER — Ambulatory Visit (INDEPENDENT_AMBULATORY_CARE_PROVIDER_SITE_OTHER): Payer: BC Managed Care – PPO | Admitting: General Surgery

## 2014-11-08 ENCOUNTER — Other Ambulatory Visit: Payer: Self-pay | Admitting: Family Medicine

## 2014-11-08 ENCOUNTER — Ambulatory Visit: Payer: BC Managed Care – PPO | Admitting: Family Medicine

## 2014-11-08 VITALS — BP 140/68 | HR 88 | Temp 98.6°F | Resp 15 | Ht 69.0 in | Wt 215.0 lb

## 2014-11-08 DIAGNOSIS — I81 Portal vein thrombosis: Secondary | ICD-10-CM

## 2014-11-08 MED ORDER — RIVAROXABAN 20 MG PO TABS: 20.0000 mg | ORAL_TABLET | Freq: Every day | ORAL | Status: DC

## 2014-11-08 NOTE — Progress Notes (Signed)
Patient ID: Emily Pitts, female   DOB: May 23, 1955, 59 y.o.   MRN: [id number]  Chief Complaint  Patient presents with  . Follow-up    CT scan    HPI Emily Pitts is a 59 y.o. female.  Here today following up from abdominal pain and CT scan. She has not had to take any pain medication today. No further chills.    HPI  Past Medical History  Diagnosis Date  . Hypertension   . Hyperlipidemia   . Allergy   . Diabetes mellitus without complication 7619  . Vitamin D deficiency   . Numbness of feet   . Low back pain, episodic     Past Surgical History  Procedure Laterality Date  . Appendectomy  1966  . Abdominal hysterectomy  2010  . Dilation and curettage of uterus      Family History  Problem Relation Age of Onset  . Diabetes Mother   . COPD Mother   . Diabetes Father   . Hypertension Father   . Cancer Father     Kidney and Prostate  . CAD Father   . Diabetes Brother     Oldest Brother    Social History Social History  Substance Use Topics  . Smoking status: Never Smoker   . Smokeless tobacco: Never Used  . Alcohol Use: No    No Known Allergies  Current Outpatient Prescriptions  Medication Sig Dispense Refill  . amoxicillin-clavulanate (AUGMENTIN) 875-125 MG per tablet Take 1 tablet by mouth 2 (two) times daily. 20 tablet 0  . aspirin EC 81 MG tablet Take 81 mg by mouth daily.    . fluticasone (FLONASE) 50 MCG/ACT nasal spray Place 2 sprays into both nostrils as needed.    Marland Kitchen glucose blood (ONE TOUCH ULTRA TEST) test strip Use as instructed 100 each 12  . HYDROcodone-acetaminophen (NORCO) 10-325 MG per tablet Take 1 tablet by mouth 2 (two) times daily. (Patient taking differently: Take 0.5 tablets by mouth 2 (two) times daily. ) 20 tablet 0  . levocetirizine (XYZAL) 5 MG tablet TAKE ONE TABLET BY MOUTH ONCE DAILY 90 tablet 1  . magnesium oxide (MAG-OX) 400 MG tablet Take 400 mg by mouth daily.    . Multiple Vitamins-Minerals (MULTIVITAMIN WITH MINERALS) tablet  Take 1 tablet by mouth daily.    Marland Kitchen omeprazole (PRILOSEC) 40 MG capsule Take 1 capsule (40 mg total) by mouth daily. 30 capsule 0  . simvastatin (ZOCOR) 40 MG tablet Take 1 tablet by mouth every evening.    . vitamin B-12 (CYANOCOBALAMIN) 1000 MCG tablet Take 1,000 mcg by mouth daily.    . vitamin C (ASCORBIC ACID) 500 MG tablet Take 500 mg by mouth daily.    . rivaroxaban (XARELTO) 20 MG TABS tablet Take 1 tablet (20 mg total) by mouth daily with supper. 30 tablet 5   No current facility-administered medications for this visit.    Review of Systems Review of Systems  Constitutional: Negative.   Respiratory: Cough: non-productive.   Cardiovascular: Negative.   Gastrointestinal: Positive for abdominal pain and constipation.    Blood pressure 140/68, pulse 88, temperature 98.6 F (37 C), temperature source Oral, resp. rate 15, height 5\' 9"  (1.753 m), weight 215 lb (97.523 kg).  Physical Exam Physical Exam  Constitutional: She appears well-developed and well-nourished.    Data Reviewed Coagulation profile pending.    Assessment    Partial portal vein thrombosis.  Abdominal pain, resolving.    Plan    The  case was reviewed with vascular surgery. Xarelto 20 mg daily 6 months has been recommended.  The indications for anticoagulation (prevention of clot propagation and extension and to encourage clot resolution) as well as the risks of anticoagulation(bleeding, especially intracranial) were reviewed with the patient. The etiology of her cough is unclear at this time. Chest x-ray obtained last month was unremarkable, and the lower half of the lung fields visualized on yesterday's CT scan showed no abnormality. She is experiencing no shortness of breath but this cough, only an annoyance. We'll plan to observe at present.  She'll complete her present course of Augmentin. She will continue her daily pediatric aspirin. She'll contact the office if the price of Xarelto was too expensive  and at that time we'll consider alternatives or attempting to get the medication from the manufacturer.       PCP:  Frazier Butt 11/08/2014, 4:11 PM

## 2014-11-08 NOTE — Patient Instructions (Signed)
The patient is aware to call back for any questions or concerns.  

## 2014-11-13 ENCOUNTER — Telehealth: Payer: Self-pay | Admitting: General Surgery

## 2014-11-13 ENCOUNTER — Other Ambulatory Visit: Payer: Self-pay

## 2014-11-13 DIAGNOSIS — I81 Portal vein thrombosis: Secondary | ICD-10-CM

## 2014-11-13 DIAGNOSIS — D6859 Other primary thrombophilia: Secondary | ICD-10-CM

## 2014-11-13 LAB — COAGULATION PANEL
ANTICARDIOLIPIN IGM: 9 [MPL'U]/mL (ref 0–12)
ANTITHROMB III FUNC: 101 % (ref 75–135)
APTT PPP: 30.2 s (ref 23.4–36.4)
AT III AG PPP IMM-ACNC: 88 % (ref 75–130)
DPT CONFIRM RATIO: 1 ratio (ref 0.00–1.40)
DPT: 43.5 s (ref 0.0–55.0)
Dilute Viper Venom Time: 33.8 s (ref 0.0–55.1)
Factor V Activity: 108 % (ref 60–140)
HOMOCYSTEINE: 8.7 umol/L (ref 0.0–15.0)
INR: 1 (ref 0.8–1.2)
PROTEIN C ACTIVITY: 111 % (ref 74–151)
PROTEIN C ANTIGEN: 88 % (ref 70–140)
PROTEIN S AG TOTAL: 131 % (ref 58–150)
PT: 13.4 s (ref 11.9–14.1)
PTT LA: 40.9 s (ref 0.0–50.0)
Protein S Activity: 38 % — ABNORMAL LOW (ref 60–145)
Protein S Ag, Free: 108 % (ref 56–124)
THROMBIN TIME: 15.1 s (ref 0.0–20.0)

## 2014-11-13 NOTE — Telephone Encounter (Signed)
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  Will benefit from formal hematology consultation. [place], desires local hematology evaluation. Reports doing well. First day back at work after one month off secondary to illness. F/U in office as planned on September 8th.

## 2014-11-14 ENCOUNTER — Telehealth: Payer: Self-pay

## 2014-11-14 NOTE — Telephone Encounter (Signed)
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  Pt prefers early AM or late PM (she works in [place].)  She is coming on Thursday for [place], can notify then or [place] can contact. No rush.

## 2014-11-14 NOTE — Telephone Encounter (Signed)
Patient is scheduled to see Dr Mike Gip at [facility] on 11/26/14 at 8:30 am. The patient is aware of date, time, and location.

## 2014-11-15 ENCOUNTER — Encounter: Payer: Self-pay | Admitting: General Surgery

## 2014-11-15 ENCOUNTER — Ambulatory Visit (INDEPENDENT_AMBULATORY_CARE_PROVIDER_SITE_OTHER): Payer: BC Managed Care – PPO | Admitting: General Surgery

## 2014-11-15 VITALS — BP 130/62 | HR 76 | Resp 12 | Ht 69.0 in | Wt 219.0 lb

## 2014-11-15 DIAGNOSIS — I81 Portal vein thrombosis: Secondary | ICD-10-CM

## 2014-11-15 NOTE — Patient Instructions (Signed)
The patient is aware to call back for any questions or concerns.  

## 2014-11-15 NOTE — Progress Notes (Signed)
Patient ID: Emily Pitts, female   DOB: [birth year], 59 y.o.   MRN: [id number]  Chief Complaint  Patient presents with  . Follow-up    HPI Emily Pitts is a 59 y.o. female.  Here today for follow up portal vein thrombosis. She states she feels much better, no further pain or chills. Occasional night sweats and pressure/lump in center chest unrelated to physical activity.        HPI  Past Medical History  Diagnosis Date  . Hypertension   . Hyperlipidemia   . Allergy   . Diabetes mellitus without complication 3212  . Vitamin D deficiency   . Numbness of feet   . Low back pain, episodic     Past Surgical History  Procedure Laterality Date  . Appendectomy  1966  . Abdominal hysterectomy  2010  . Dilation and curettage of uterus    . Colonoscopy  06-07-13    Dr Bary Castilla    Family History  Problem Relation Age of Onset  . Diabetes Mother   . COPD Mother   . Diabetes Father   . Hypertension Father   . Cancer Father     Kidney and Prostate  . CAD Father   . Diabetes Brother     Oldest Brother    Social History Social History  Substance Use Topics  . Smoking status: Never Smoker   . Smokeless tobacco: Never Used  . Alcohol Use: No    No Known Allergies  Current Outpatient Prescriptions  Medication Sig Dispense Refill  . amoxicillin-clavulanate (AUGMENTIN) 875-125 MG per tablet Take 1 tablet by mouth 2 (two) times daily. 20 tablet 0  . aspirin EC 81 MG tablet Take 81 mg by mouth daily.    . fluticasone (FLONASE) 50 MCG/ACT nasal spray Place 2 sprays into both nostrils as needed.    Marland Kitchen glucose blood (ONE TOUCH ULTRA TEST) test strip Use as instructed 100 each 12  . levocetirizine (XYZAL) 5 MG tablet TAKE ONE TABLET BY MOUTH ONCE DAILY 90 tablet 1  . magnesium oxide (MAG-OX) 400 MG tablet Take 400 mg by mouth daily.    . Multiple Vitamins-Minerals (MULTIVITAMIN WITH MINERALS) tablet Take 1 tablet by mouth daily.    Marland Kitchen omeprazole (PRILOSEC) 40 MG capsule Take 1 capsule  (40 mg total) by mouth daily. 30 capsule 0  . rivaroxaban (XARELTO) 20 MG TABS tablet Take 1 tablet (20 mg total) by mouth daily with supper. 30 tablet 5  . simvastatin (ZOCOR) 40 MG tablet Take 1 tablet by mouth every evening.    . vitamin B-12 (CYANOCOBALAMIN) 1000 MCG tablet Take 1,000 mcg by mouth daily.    . vitamin C (ASCORBIC ACID) 500 MG tablet Take 500 mg by mouth daily.     No current facility-administered medications for this visit.    Review of Systems Review of Systems  Constitutional: Negative.   Respiratory: Negative.   Cardiovascular: Negative.   Gastrointestinal: Negative for abdominal pain.    Blood pressure 130/62, pulse 76, resp. rate 12, height 5\' 9"  (1.753 m), weight 219 lb (99.338 kg).  Physical Exam Physical Exam  Constitutional: She is oriented to person, place, and time. She appears well-developed and well-nourished.  HENT:  Mouth/Throat: Oropharynx is clear and moist.  Eyes: Conjunctivae are normal. No scleral icterus.  Neurological: She is alert and oriented to person, place, and time.  Skin: Skin is warm.  Psychiatric: Her behavior is normal.    Data Reviewed Coagulation panel showed protein S  deficiency.  Assessment    Doing well on Xarelto.    Plan    Formal hematology assessment is scheduled.  I have asked the had of medicine at the hospital to review her inpatient records for an independent assessment. She was diagnosed with pyelonephritis, but on my review the records I find nothing to support this diagnosis. During her second hospitalization she had significant elevation of her liver function studies, which would go along with the present diagnosis of portal vein thrombosis. The patient will be notified about the independent review.     Further follow-up here will take place based on hematology assessment. At this time, I don't see an indication for elective cholecystectomy without symptoms more directly attributable to the  gallbladder.  Dr Mike Gip appointment is Sept 19 2016 at 830.  PCP:  Frazier Butt 11/16/2014, 9:01 AM

## 2014-11-26 ENCOUNTER — Ambulatory Visit: Payer: BC Managed Care – PPO | Admitting: Hematology and Oncology

## 2014-11-27 ENCOUNTER — Other Ambulatory Visit: Payer: Self-pay

## 2014-11-27 ENCOUNTER — Encounter: Payer: Self-pay | Admitting: Hematology and Oncology

## 2014-11-27 ENCOUNTER — Inpatient Hospital Stay: Payer: BC Managed Care – PPO

## 2014-11-27 ENCOUNTER — Inpatient Hospital Stay: Payer: BC Managed Care – PPO | Attending: Hematology and Oncology | Admitting: Hematology and Oncology

## 2014-11-27 VITALS — BP 125/80 | HR 75 | Temp 96.0°F | Resp 18 | Ht 69.0 in | Wt 217.6 lb

## 2014-11-27 DIAGNOSIS — I81 Portal vein thrombosis: Secondary | ICD-10-CM

## 2014-11-27 DIAGNOSIS — D649 Anemia, unspecified: Secondary | ICD-10-CM | POA: Insufficient documentation

## 2014-11-27 DIAGNOSIS — E119 Type 2 diabetes mellitus without complications: Secondary | ICD-10-CM

## 2014-11-27 DIAGNOSIS — Z79899 Other long term (current) drug therapy: Secondary | ICD-10-CM

## 2014-11-27 DIAGNOSIS — I10 Essential (primary) hypertension: Secondary | ICD-10-CM | POA: Diagnosis not present

## 2014-11-27 LAB — CBC WITH DIFFERENTIAL/PLATELET
Basophils Absolute: 0.1 10*3/uL (ref 0–0.1)
Basophils Relative: 1 %
Eosinophils Absolute: 0.1 10*3/uL (ref 0–0.7)
Eosinophils Relative: 1 %
HCT: 41.3 % (ref 35.0–47.0)
Hemoglobin: 13.4 g/dL (ref 12.0–16.0)
Lymphocytes Relative: 29 %
Lymphs Abs: 2.2 10*3/uL (ref 1.0–3.6)
MCH: 27 pg (ref 26.0–34.0)
MCHC: 32.4 g/dL (ref 32.0–36.0)
MCV: 83.3 fL (ref 80.0–100.0)
Monocytes Absolute: 0.6 10*3/uL (ref 0.2–0.9)
Monocytes Relative: 8 %
Neutro Abs: 4.8 10*3/uL (ref 1.4–6.5)
Neutrophils Relative %: 61 %
Platelets: 216 10*3/uL (ref 150–440)
RBC: 4.96 MIL/uL (ref 3.80–5.20)
RDW: 16.3 % — ABNORMAL HIGH (ref 11.5–14.5)
WBC: 7.8 10*3/uL (ref 3.6–11.0)

## 2014-11-27 LAB — FOLATE: Folate: 26 ng/mL (ref >5.9)

## 2014-11-27 LAB — IRON AND TIBC
Iron: 41 ug/dL (ref 28–170)
Saturation Ratios: 10 % — ABNORMAL LOW (ref 10.4–31.8)
TIBC: 395 ug/dL (ref 250–450)
UIBC: 354 ug/dL

## 2014-11-27 LAB — RETICULOCYTES
RBC.: 4.96 MIL/uL (ref 3.80–5.20)
Retic Count, Absolute: 59.5 10*3/uL (ref 19.0–183.0)
Retic Ct Pct: 1.2 % (ref 0.4–3.1)

## 2014-11-27 LAB — FERRITIN: Ferritin: 39 ng/mL (ref 11–307)

## 2014-11-27 LAB — VITAMIN B12: Vitamin B-12: 758 pg/mL (ref 180–914)

## 2014-11-27 NOTE — Progress Notes (Signed)
[facility] day:  11/27/2014  Chief Complaint: Emily Pitts is a 59 y.o. female with portal vein thrombosis who is referred in consultation by Dr. Hervey Ard.  HPI:   The patient states that she was hospitalized twice in 10/2014.  He was first admitted from 10/10/2014 - 10/12/2014 with sepsis and acute kidney injury.  She presented with malaise, fever (up to 102.4), and back pain. She had nausea and chills.  Abdominal and pelvic CT scan without contrast on 10/10/2014 revealed colonic diverticulosis and a small hiatal hernia.  Urinalysis revealed 6-30 WBC with rare bacteria.  Blood and urine cultures were negative.  BUN was 36 with a creatinine of 2.35 consistent with dehydration.   Albumin was 2.9 and bilirubin 2.7. The time of discharge BUN was 13 with a creatinine of 0.90. She was treated with Rocephin and discharged on ciprofloxacin.   She was readmitted to [facility] from 10/22/2014 - 10/25/2014.  He presented with syncope, weakness, and poor oral intake.  He was again felt to have acute renal failure likely due to dehydration and blood pressure medications. She improved with IV fluid support. He was treated with Zosyn.    She then presented with upper abdominal pain.  Abdominal ultrasound on 11/06/2014 revealed nonmobile echogenic material shadowing within the gallbladder is back to to be a mixture of sludge anterior gallstones. It was increased liver echogenicity most likely negative of hepatic steatosis. There are no focal liver lesions. The pancreas was obscured by gas.  He was referred to Dr. Bary Castilla for evaluation of gallstones.  She was found to have abdominal pain of unclear etiology. Abdominal and pelvic CT with oral and IV contrast on 11/07/2014 revealed a partially thrombosed portal veins, more so on the left. The main portal vein and mesenteric veins remain patent. Small porta hepatis nodes measured 1.2 cm in short axis  (stable).  There was no  free fluid. A  9 mm ventral abdominal subcutaneous soft tissue nodule (nonspecific) was noted There was diverticulosis and a benign appearing lipoma of the sigmoid colon.  The case was reviewed with vascular surgery. Xarelto for 6 months was recommended.  A hypercoagulable work-up was sent.  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  Protein S activity was 38% (60-145%).  The following studies were normal:  prothrombin gene mutation, lupus anticoagulant panel, anticardiolipin antibodies, homocysteine, protein C antigen (88%), protein C activity (111%), protein S antigen total (131%), and protein S antigen free (108%).  She was started on Xarelto on 11/08/2014.  She denies any prior history of thrombosis.  She denies any family history of thrombosis.  Chest x-ray on 11/05/2014 revealed an abnormality.  Mammogram on 09/2014 was negative per the patient report.  She has not been on birth control pills.  Symptomatically, she feels really good. She notes a little constipation. She's gained some weight back.  Past Medical History  Diagnosis Date  . Hypertension   . Hyperlipidemia   . Allergy   . Diabetes mellitus without complication 1884  . Vitamin D deficiency   . Numbness of feet   . Low back pain, episodic     Past Surgical History  Procedure Laterality Date  . Appendectomy  1966  . Abdominal hysterectomy  2010  . Dilation and curettage of uterus    . Colonoscopy  06-07-13    Dr Bary Castilla    Family History  Problem Relation Age of Onset  . Diabetes Mother   .  COPD Mother   . Diabetes Father   . Hypertension Father   . Cancer Father     Kidney and Prostate  . CAD Father   . Diabetes Brother     Oldest Brother  Her grandfather had a CVA in his 79s.  A niece had an early pregnancy loss.   Social History:  reports that she has never smoked. She has never used smokeless tobacco. She reports that she does not drink alcohol or use illicit drugs.  She does not have any  children.  The patient is alone today.  Allergies: No Known Allergies  Current Medications: Current Outpatient Prescriptions  Medication Sig Dispense Refill  . aspirin EC 81 MG tablet Take 81 mg by mouth daily.    . fluticasone (FLONASE) 50 MCG/ACT nasal spray Place 2 sprays into both nostrils as needed.    Marland Kitchen glucose blood (ONE TOUCH ULTRA TEST) test strip Use as instructed 100 each 12  . levocetirizine (XYZAL) 5 MG tablet TAKE ONE TABLET BY MOUTH ONCE DAILY 90 tablet 1  . magnesium oxide (MAG-OX) 400 MG tablet Take 400 mg by mouth daily.    . Multiple Vitamins-Minerals (MULTIVITAMIN WITH MINERALS) tablet Take 1 tablet by mouth daily.    Marland Kitchen omeprazole (PRILOSEC) 40 MG capsule Take 1 capsule (40 mg total) by mouth daily. 30 capsule 0  . rivaroxaban (XARELTO) 20 MG TABS tablet Take 1 tablet (20 mg total) by mouth daily with supper. 30 tablet 5  . simvastatin (ZOCOR) 40 MG tablet Take 1 tablet by mouth every evening.    . vitamin B-12 (CYANOCOBALAMIN) 1000 MCG tablet Take 1,000 mcg by mouth daily.    . vitamin C (ASCORBIC ACID) 500 MG tablet Take 500 mg by mouth daily.     No current facility-administered medications for this visit.    Review of Systems:  GENERAL:  Feels really good.  No fevers or sweats.  Initial weight loss with recent weight gain. PERFORMANCE STATUS (ECOG):  1 HEENT:  No visual changes, runny nose, sore throat, mouth sores or tenderness. Lungs: No shortness of breath or cough.  No hemoptysis. Cardiac:  No chest pain, palpitations, orthopnea, or PND. GI:  Constipation.  No nausea, vomiting, diarrhea, melena or hematochezia. GU:  No urgency, frequency, dysuria, or hematuria. Musculoskeletal:  No back pain.  No joint pain.  No muscle tenderness. Extremities:  No pain or swelling. Skin:  No rashes or skin changes. Neuro:  No headache, numbness or weakness, balance or coordination issues. Endocrine:  Diabetes.  No thyroid issues, hot flashes or night sweats. Psych:  No  mood changes, depression or anxiety. Pain:  No focal pain. Review of systems:  All other systems reviewed and found to be negative.  Physical Exam: Blood pressure 125/80, pulse 75, temperature 96 F (35.6 C), temperature source Tympanic, resp. rate 18, height 5\' 9"  (1.753 m), weight 217 lb 9.5 oz (98.7 kg). GENERAL:  Well developed, well nourished, sitting comfortably in the exam room in no acute distress. MENTAL STATUS:  Alert and oriented to person, place and time. HEAD:  Short styled strawberry blonde hair.  Normocephalic, atraumatic, face symmetric, no Cushingoid features. EYES:  Blue eyes.  Pupils equal round and reactive to light and accomodation.  No conjunctivitis or scleral icterus. ENT:  Oropharynx clear without lesion.  Tongue normal. Mucous membranes moist.  RESPIRATORY:  Clear to auscultation without rales, wheezes or rhonchi. CARDIOVASCULAR:  Regular rate and rhythm without murmur, rub or gallop. ABDOMEN:  Soft, non-tender,  with active bowel sounds, and no appreciablehepatosplenomegaly.  No masses. SKIN:  No rashes, ulcers or lesions. EXTREMITIES: No edema, no skin discoloration or tenderness.  No palpable cords. LYMPH NODES: No palpable cervical, supraclavicular, axillary or inguinal adenopathy  NEUROLOGICAL: Unremarkable. PSYCH:  Appropriate.  No visits with results within 3 Day(s) from this visit. Latest known visit with results is:  Office Visit on 11/07/2014  Component Date Value Ref Range Status  . Homocysteine 11/07/2014 8.7  0.0 - 15.0 umol/L Final  . Thrombin Time 11/07/2014 15.1  0.0 - 20.0 sec Final  . Factor V Activity 11/07/2014 108  60 - 140 % Final  . PT 11/07/2014 13.4  11.9 - 14.1 sec Final  . INR 11/07/2014 1.0  0.8 - 1.2 Final   Comment: Reference interval is for non-anticoagulated patients. Suggested INR therapeutic range for Vitamin K antagonist therapy:    Standard Dose (moderate intensity                   therapeutic range):       2.0 - 3.0     Higher intensity therapeutic range       2.5 - 3.5   . APTT PPP 11/07/2014 30.2  23.4 - 36.4 sec Final  . AntiThromb III Func 11/07/2014 101  75 - 135 % Final  . AT III AG PPP IMM-ACNC 11/07/2014 88  75 - 130 % Final  . Protein C Antigen 11/07/2014 88  70 - 140 % Final  . Protein C Activity 11/07/2014 111  74 - 151 % Final  . Protein S Ag, Total 11/07/2014 131  58 - 150 % Final  . Protein S Ag, Free 11/07/2014 108  56 - 124 % Final  . Protein S Activity 11/07/2014 38* 60 - 145 % Final   Comment: A deficiency of protein S (PS), either congenital or acquired, increases the risk of thromboembolism. Congenital deficiencies of PS are very rare; acquired PS deficiency is much more common. Acquired deficiency can occur as the result of decreased PS synthesis or increased consumption. PS synthesis can be diminished in a number of conditions including anti-vitamin K (warfarin) therapy, vitamin K deficiency, severe liver disease, and malnutrition. PS levels decrease with normal pregnancy. Levels may be spuriously decreased in individuals with Factor V Leiden. Levels may be decreased in nephrotic syndrome, women on oral contraceptive/hormone replacement therapy and in patients receiving chemotherapy or L-asparaginse therapy. PS consumption can occur during disseminated intravascular coagulation (DIC) and acute thrombosis. It has been suggested that repeat blood sampling and testing after ruling out acquired causes of deficiency should be performed before the patient is dia                          gnosed with congenital Protein S deficiency. **Verified by repeat analysis**   . PTT Lupus Anticoagulant 11/07/2014 40.9  0.0 - 50.0 sec Final  . Dilute Viper Venom Time 11/07/2014 33.8  0.0 - 55.1 sec Final  . Lupus Anticoag Interp 11/07/2014 Comment:   Final   No lupus anticoagulant was detected.  Marland Kitchen dPT 11/07/2014 43.5  0.0 - 55.0 sec Final  . dPT Confirm Ratio 11/07/2014 1.00  0.00 - 1.40 Ratio  Final  . Anticardiolipin IgG 11/07/2014 <9  0 - 14 GPL U/mL Final   Comment:  Negative:              <15                           Indeterminate:     15 - 20                           Low-Med Positive: >20 - 80                           High Positive:         >80   . Anticardiolipin IgM 11/07/2014 9  0 - 12 MPL U/mL Final   Comment:                           Negative:              <13                           Indeterminate:     13 - 20                           Low-Med Positive: >20 - 80                           High Positive:         >80   . Anticardiolipin IgA 11/07/2014 <9  0 - 11 APL U/mL Final   Comment:                           Negative:              <12                           Indeterminate:     12 - 20                           Low-Med Positive: >20 - 80                           High Positive:         >80   . FACTOR V LEIDEN 11/07/2014 Comment*  Final   Comment: 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 Factor V Leiden is more resistant to inactivation by activated protein C.  As a result, factor V persists in the circulation leading to a mild hypercoagulable state.  Factor V Leiden has been reported in patients with deep vein thrombosis, pulmonary embolus, central retinal vein occulsion, cerebral sinus thrombosis, and hepatic vein thrombosis. The relative risk of venous thrombosis is increased approximately 4-8 fold in individuals who are heterozygous.  About 3-8% of the general [place] and European population are heterozygous.  The risk of venous thrombosis increases exponentially in patients with more than one risk factor, including:  age, surgery, oral contraceptive use, pregnancy, elevated homocysteine levels, or a Factor II/prothrombin mutation (G20210A).  Additionally, for individuals  found to be heterozygous for the Factor V Leiden mutation, presence of a second mutation, Factor V R2, further increases the risk if venous thrombosis. Contact [facility]'s Genetics Customer Service at [phone] for further information on both the Factor II (Prothrombin) DNA Analysis, and Factor V R2 DNA Analysis tests. **Genetic counselors are available for health care providers to**   discuss results at [phone] [phone]). Methodology: DNA analysis of the Factor V gene was performed by allele-specific PCR. The diagnostic sensitivity and specificity is >99% for both. Molecular-based testing is highly accurate, but as in any laboratory test, diagnostic errors may occur. All test results must be combined with clinical information for the most accurate interpretation. References: Voelkerding K (1996).  Clin Lab Med [phone]. Allison Quarry, PhD, [facility] Ruben Reason, PhD, [facility] Jens Som, PhD, [facility] Annetta Maw, M.S., PhD, [facility] Hillery Jacks                          s, PhD, [facility] Norva Riffle, PhD, [facility] Earlean Polka PhD, [facility]   . Factor II, DNA Analysis 11/07/2014 Comment   Final   Comment: NEGATIVE No mutation identified. Comment: A point mutation (G20210A) in the factor II (prothrombin) gene is the second most common cause of inherited thrombophilia. The incidence of this mutation in the [place] Caucasian population is about 2% and in the [place] American population it is approximately 0.5%. This mutation is rare in the [place] and Native American population. Being heterozygous for a prothrombin mutation increases the risk for developing venous thrombosis about 2 to 3 times above the general population risk. Being homozygous for the prothrombin gene mutation increases the relative risk for venous thrombosis further, although it is not yet known how much further the risk is increased. In women heterozygous for the prothrombin gene mutation,  the use of estrogen containing oral contraceptives increases the relative risk of venous thrombosis about 16 times and the risk of developing cerebral thrombosis is also significantly increased. In pregnancy the prothrombin                           gene mutation increases risk for venous thrombosis and may increase risk for stillbirth, placental abruption, pre-eclampsia and fetal growth restriction. If the patient possesses two or more congenital or acquired thrombophilic risk factors, the risk for thrombosis may rise to more than the sum of the risk ratios for the individual mutations. This assay detects only the prothrombin G20210A mutation and does not measure genetic abnormalities elsewhere in the genome. Other thrombotic risk factors may be pursued through systematic clinical laboratory analysis. These factors include the R506Q (Leiden) mutation in the Factor V gene, plasma homocysteine levels, as well as testing for deficiencies of antithrombin III, protein C and protein S. Genetic Counselors are available for health care providers to discuss results at [phone]). Methodology: DNA analysis of the Factor II gene was performed by PCR amplification followed by restriction analysis. The diagnostic                           sensitivity is >99% for both. All the tests must be combined with clinical information for the most accurate interpretation. Molecular-based testing is highly accurate, but as in any laboratory test, diagnostic errors may occur. Poort SR, et al. Blood. 1996; 81:8563-1497. Varga EA. Circulation. 2004; 026:V78-H88. Mervin Hack, et al. Arterioscler Thromb Vasc Biol.  1999; 19:700-703. Allison Quarry, PhD, [facility] Ruben Reason, PhD, [facility] Jens Som, PhD, [facility] Annetta Maw, M.S., PhD, [facility] Alfredo Bach, PhD, [facility] Norva Riffle, PhD, [facility] Earlean Polka, PhD, [facility]     Assessment:  AVIAH SORCI is a 59 y.o. female with portal  vein thrombosis presenting with fever, chills, dehydration, weight loss, and abdominal pain.  Symptoms lasted for about 4 weeks.  Abdominal and pelvic CT on 11/07/2014 revealed a partially thrombosed portal veins, more so on the left.  The main portal vein and mesenteric veins remained patent.  Porta hepatis nodes measured 1.2 cm in short axis (stable).  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  Protein S activity was 38% (60-145%).  The following studies were normal:  prothrombin gene mutation, lupus anticoagulant panel, anticardiolipin antibodies, homocysteine, protein C antigen (88%), protein C activity (111%), protein S antigen total (131%), and protein S antigen free (108%).  She was started on Xarelto on 11/08/2014.  She denies any prior history of thrombosis.  She denies any family history of thrombosis.  Chest x-ray on 11/05/2014 was negative.  Mammogram in 09/2014 was negative per patient report.  She has not been on birth control pills.  She has a mild normocytic anemia.  She denies any melena or hematochezia.  Hematocrit has improved since her initial hospitalization.  Symptomatically, she feels really good. She notes a little constipation. She has gained some weight back.  Plan: 1.  Discuss diagnosis and management of portal vein thrombosis.  Etiology is unclear.  Patient initially felt to have a kidney infection, but with negative cultures and most likely symptoms caused by portal vein thrombosis.  She may have had an abdominal infection as the precipitating event included fever.  Discuss hypercoagulablle workup including PNH by flow cytometry.  By history, there is no evidence of cirrhosis, inflammatory bowel disease, or collagen vascular disease (lupus). Liver imaging was unremarkable.  She has had no trauma.  By report, she had a negative mammogram.  She does not smoke.  CXR was negative.  Discuss 6 months of anticoagulation if no  thrombotic risk is identified. However if thrombotic risk cannot be corrected, she will require long-term anticoagulation. 2.  Labs today:  CBC with diff, PNH by flow cytometry, SPEP, free light chains, immunoglobulins, ferritin, iron studies, retic, B12, and folate. 3.  Collect 24 hour urine for UPEP and free light chains. 4.  Continue Xarelto. 5.  RTC in 2 weeks for MD assessment and review of testing.   Lequita Asal, MD  11/27/2014, 9:38 AM

## 2014-11-28 LAB — PROTEIN ELECTROPHORESIS, SERUM
A/G Ratio: 0.9 (ref 0.7–1.7)
Albumin ELP: 3.4 g/dL (ref 2.9–4.4)
Alpha-1-Globulin: 0.3 g/dL (ref 0.0–0.4)
Alpha-2-Globulin: 0.7 g/dL (ref 0.4–1.0)
Beta Globulin: 1.1 g/dL (ref 0.7–1.3)
Gamma Globulin: 1.5 g/dL (ref 0.4–1.8)
Globulin, Total: 3.6 g/dL (ref 2.2–3.9)
Total Protein ELP: 7 g/dL (ref 6.0–8.5)

## 2014-11-29 ENCOUNTER — Other Ambulatory Visit: Payer: Self-pay

## 2014-11-29 DIAGNOSIS — I81 Portal vein thrombosis: Secondary | ICD-10-CM | POA: Diagnosis not present

## 2014-11-29 DIAGNOSIS — D649 Anemia, unspecified: Secondary | ICD-10-CM

## 2014-11-30 LAB — IGG, IGA, IGM
IgA: 274 mg/dL (ref 87–352)
IgG (Immunoglobin G), Serum: 1277 mg/dL (ref 700–1600)
IgM, Serum: 247 mg/dL — ABNORMAL HIGH (ref 26–217)

## 2014-11-30 LAB — UPEP/TP, 24-HR URINE

## 2014-11-30 LAB — KAPPA/LAMBDA LIGHT CHAINS
Kappa free light chain: 26.67 mg/L — ABNORMAL HIGH (ref 3.30–19.40)
Kappa, lambda light chain ratio: 1.08 (ref 0.26–1.65)
Lambda free light chains: 24.67 mg/L (ref 5.71–26.30)

## 2014-12-03 ENCOUNTER — Ambulatory Visit: Payer: Self-pay | Admitting: Family Medicine

## 2014-12-03 LAB — UPEP/TP, 24-HR URINE
Albumin, U: 100 %
Alpha 1, Urine: 0 %
Alpha 2, Urine: 0 %
Beta, Urine: 0 %
Gamma Globulin, Urine: 0 %
Total Protein, Urine-Ur/day: 359.6 mg/24 hr — ABNORMAL HIGH (ref 30.0–150.0)
Total Protein, Urine: 11.6 mg/dL
Total Volume: 3100

## 2014-12-11 ENCOUNTER — Ambulatory Visit: Payer: BC Managed Care – PPO | Admitting: Hematology and Oncology

## 2014-12-11 ENCOUNTER — Inpatient Hospital Stay: Payer: BC Managed Care – PPO

## 2014-12-11 ENCOUNTER — Other Ambulatory Visit: Payer: Self-pay

## 2014-12-11 ENCOUNTER — Inpatient Hospital Stay: Payer: BC Managed Care – PPO | Attending: Hematology and Oncology | Admitting: Hematology and Oncology

## 2014-12-11 VITALS — BP 163/79 | HR 74 | Temp 98.2°F | Ht 69.0 in | Wt 219.5 lb

## 2014-12-11 DIAGNOSIS — E785 Hyperlipidemia, unspecified: Secondary | ICD-10-CM | POA: Insufficient documentation

## 2014-12-11 DIAGNOSIS — I81 Portal vein thrombosis: Secondary | ICD-10-CM | POA: Diagnosis not present

## 2014-12-11 DIAGNOSIS — D649 Anemia, unspecified: Secondary | ICD-10-CM

## 2014-12-11 DIAGNOSIS — I10 Essential (primary) hypertension: Secondary | ICD-10-CM | POA: Diagnosis not present

## 2014-12-11 DIAGNOSIS — D6851 Activated protein C resistance: Secondary | ICD-10-CM | POA: Diagnosis not present

## 2014-12-11 DIAGNOSIS — Z7901 Long term (current) use of anticoagulants: Secondary | ICD-10-CM

## 2014-12-11 DIAGNOSIS — Z7982 Long term (current) use of aspirin: Secondary | ICD-10-CM | POA: Diagnosis not present

## 2014-12-11 LAB — SEDIMENTATION RATE: Sed Rate: 27 mm/hr (ref 0–30)

## 2014-12-11 NOTE — Progress Notes (Signed)
Patient here for follow up no complaints

## 2014-12-17 LAB — PROTEIN S, TOTAL AND FREE
Protein S Ag, Free: 77 % (ref 56–124)
Protein S Ag, Total: 95 % (ref 58–150)

## 2014-12-17 LAB — PNH PROFILE (-HIGH SENSITIVITY): Viability:: 97

## 2014-12-17 LAB — ANA W/REFLEX: Anti Nuclear Antibody(ANA): NEGATIVE

## 2014-12-17 LAB — PROTEIN S ACTIVITY: Protein S Activity: 69 % (ref 60–145)

## 2015-01-10 ENCOUNTER — Inpatient Hospital Stay: Payer: BC Managed Care – PPO | Attending: Hematology and Oncology | Admitting: Hematology and Oncology

## 2015-01-10 VITALS — BP 119/70 | HR 81 | Temp 98.5°F | Resp 18 | Ht 69.0 in | Wt 222.2 lb

## 2015-01-10 DIAGNOSIS — D6851 Activated protein C resistance: Secondary | ICD-10-CM | POA: Diagnosis not present

## 2015-01-10 DIAGNOSIS — E785 Hyperlipidemia, unspecified: Secondary | ICD-10-CM | POA: Insufficient documentation

## 2015-01-10 DIAGNOSIS — I81 Portal vein thrombosis: Secondary | ICD-10-CM | POA: Insufficient documentation

## 2015-01-10 DIAGNOSIS — Z7982 Long term (current) use of aspirin: Secondary | ICD-10-CM | POA: Insufficient documentation

## 2015-01-10 DIAGNOSIS — I10 Essential (primary) hypertension: Secondary | ICD-10-CM | POA: Insufficient documentation

## 2015-01-10 DIAGNOSIS — Z7901 Long term (current) use of anticoagulants: Secondary | ICD-10-CM | POA: Insufficient documentation

## 2015-01-10 NOTE — Progress Notes (Signed)
Patient is here for follow-up of portal vein thrombosis. She states that she has been doing well and offers no complaints today.

## 2015-01-15 ENCOUNTER — Ambulatory Visit (INDEPENDENT_AMBULATORY_CARE_PROVIDER_SITE_OTHER): Payer: BC Managed Care – PPO | Admitting: Family Medicine

## 2015-01-15 ENCOUNTER — Encounter: Payer: Self-pay | Admitting: Family Medicine

## 2015-01-15 VITALS — BP 134/68 | HR 89 | Temp 98.0°F | Resp 18 | Ht 69.0 in | Wt 223.2 lb

## 2015-01-15 DIAGNOSIS — J309 Allergic rhinitis, unspecified: Secondary | ICD-10-CM | POA: Diagnosis not present

## 2015-01-15 DIAGNOSIS — J3089 Other allergic rhinitis: Secondary | ICD-10-CM

## 2015-01-15 DIAGNOSIS — R809 Proteinuria, unspecified: Secondary | ICD-10-CM | POA: Diagnosis not present

## 2015-01-15 DIAGNOSIS — J302 Other seasonal allergic rhinitis: Secondary | ICD-10-CM

## 2015-01-15 DIAGNOSIS — R42 Dizziness and giddiness: Secondary | ICD-10-CM | POA: Diagnosis not present

## 2015-01-15 DIAGNOSIS — Z23 Encounter for immunization: Secondary | ICD-10-CM

## 2015-01-15 DIAGNOSIS — I81 Portal vein thrombosis: Secondary | ICD-10-CM

## 2015-01-15 DIAGNOSIS — E785 Hyperlipidemia, unspecified: Secondary | ICD-10-CM

## 2015-01-15 DIAGNOSIS — K219 Gastro-esophageal reflux disease without esophagitis: Secondary | ICD-10-CM | POA: Diagnosis not present

## 2015-01-15 DIAGNOSIS — E1129 Type 2 diabetes mellitus with other diabetic kidney complication: Secondary | ICD-10-CM | POA: Diagnosis not present

## 2015-01-15 DIAGNOSIS — I10 Essential (primary) hypertension: Secondary | ICD-10-CM | POA: Diagnosis not present

## 2015-01-15 DIAGNOSIS — E43 Unspecified severe protein-calorie malnutrition: Secondary | ICD-10-CM | POA: Diagnosis not present

## 2015-01-15 LAB — POCT GLYCOSYLATED HEMOGLOBIN (HGB A1C): Hemoglobin A1C: 5.8

## 2015-01-15 LAB — POCT UA - MICROALBUMIN: Microalbumin Ur, POC: NEGATIVE mg/L

## 2015-01-15 MED ORDER — ATORVASTATIN CALCIUM 40 MG PO TABS: 40.0000 mg | ORAL_TABLET | Freq: Every day | ORAL | Status: DC

## 2015-01-15 MED ORDER — LOSARTAN POTASSIUM 100 MG PO TABS: 100.0000 mg | ORAL_TABLET | Freq: Every day | ORAL | Status: DC

## 2015-01-15 NOTE — Progress Notes (Signed)
Name: Emily Pitts   MRN: [id number]    DOB: 09-Oct-1955   Date:01/15/2015       Progress Note  Subjective  Chief Complaint  Chief Complaint  Patient presents with  . Medication Refill    follow-up  . Diabetes    checks BG 2-3x week low-106, avg-125, high-157  . Hyperlipidemia  . Gastroesophageal Reflux  . Dizziness    onset 1.5 months, patient describes as swimmy headed.  only happens when she stands up and at night.    HPI  Diabetes Type II with microalbuminuria: on diet only and hgbA1C is back to normal , down to 5.8% , no blurred vision, no polyphagia, polyuria or polydipsia. Chronic nocturia once per night - she drinks a lot of water in the evenings. Complaint with diabetic diet. Glucose has at goal at home. She is on Losartan 50 mg daily   HTN: she is on Losartan 50 mg and bp is at goal, denies side effects of medication. No chest pain or palpitation  Hyperlipidemia: she was given Atorvastatin but pharmacy is still filling prescription of Simvastatin. Denies side effects  GERD: she stopped taking Omeprazole, controlled with life style modification, no heartburn or regurgitation  Vertigo: started about 6 weeks ago, suddenly, happens every night when turning in bed, the room spins, sometimes when standing and moves her head. No nausea or vomiting associated with symptoms. No tinnitus or hearing loss. Some facial pressure.   Perennial AR with seasonal variation: symptoms of allergies are back with nasal congestion, facial pressure, sneezing, rhinorrhea, no pruritus. She was advised to resume nasal spray daily , continue Xyzal  Portal Vein Thrombosis: seen hematologist, she will have repeat doppler in a few months, taking Xarelto, denies any bleeding.   Patient Active Problem List   Diagnosis Date Noted  . Anemia 11/27/2014  . Portal vein thrombosis 11/07/2014  . Protein-calorie malnutrition, severe ([place]) 10/23/2014  . History of sepsis 10/10/2014  . Abnormal electrocardiogram  10/08/2014  . Nonspecific abnormal electromyogram (EMG) 10/08/2014  . Benign essential HTN 10/08/2014  . Diabetes mellitus with renal manifestation ([place]) 10/08/2014  . Dyslipidemia 10/08/2014  . LBP (low back pain) 10/08/2014  . Gastro-esophageal reflux disease without esophagitis 10/08/2014  . Microalbuminuria 10/08/2014  . Disturbance of skin sensation 10/08/2014  . Obesity (BMI 30-39.9) 10/08/2014  . Perennial allergic rhinitis with seasonal variation 10/08/2014  . Plantar fasciitis 10/08/2014  . Postablative ovarian failure 10/08/2014  . Feeling stressed out 10/08/2014  . Menopausal symptom 10/08/2014  . Vitamin D deficiency 10/08/2014  . Encounter for screening colonoscopy 05/15/2013    Past Surgical History  Procedure Laterality Date  . Appendectomy  1966  . Abdominal hysterectomy  2010  . Dilation and curettage of uterus    . Colonoscopy  06-07-13    Dr Bary Castilla    Family History  Problem Relation Age of Onset  . Diabetes Mother   . COPD Mother   . Diabetes Father   . Hypertension Father   . Cancer Father     Kidney and Prostate  . CAD Father   . Diabetes Brother     Oldest Brother    Social History   Social History  . Marital Status: Single    Spouse Name: N/A  . Number of Children: N/A  . Years of Education: N/A   Occupational History  . Not on file.   Social History Main Topics  . Smoking status: Never Smoker   . Smokeless tobacco: Never Used  .  Alcohol Use: No  . Drug Use: No  . Sexual Activity: Not on file   Other Topics Concern  . Not on file   Social History Narrative     Current outpatient prescriptions:  .  aspirin EC 81 MG tablet, Take 81 mg by mouth daily., Disp: , Rfl:  .  fluticasone (FLONASE) 50 MCG/ACT nasal spray, Place 2 sprays into both nostrils as needed., Disp: , Rfl:  .  glucose blood (ONE TOUCH ULTRA TEST) test strip, Use as instructed, Disp: 100 each, Rfl: 12 .  levocetirizine (XYZAL) 5 MG tablet, TAKE ONE TABLET BY  MOUTH ONCE DAILY, Disp: 90 tablet, Rfl: 1 .  magnesium oxide (MAG-OX) 400 MG tablet, Take 400 mg by mouth daily., Disp: , Rfl:  .  omeprazole (PRILOSEC) 40 MG capsule, Take 1 capsule (40 mg total) by mouth daily., Disp: 30 capsule, Rfl: 0 .  rivaroxaban (XARELTO) 20 MG TABS tablet, Take 1 tablet (20 mg total) by mouth daily with supper., Disp: 30 tablet, Rfl: 5 .  simvastatin (ZOCOR) 40 MG tablet, Take 1 tablet by mouth every evening., Disp: , Rfl:  .  vitamin B-12 (CYANOCOBALAMIN) 1000 MCG tablet, Take 1,000 mcg by mouth daily., Disp: , Rfl:  .  vitamin C (ASCORBIC ACID) 500 MG tablet, Take 500 mg by mouth daily., Disp: , Rfl:   No Known Allergies   ROS  Constitutional: Negative for fever or weight change.  Respiratory: Negative for cough and shortness of breath.   Cardiovascular: Negative for chest pain or palpitations.  Gastrointestinal: Negative for abdominal pain, no bowel changes.  Musculoskeletal: Negative for gait problem or joint swelling.  Skin: Negative for rash.  Neurological: Positive  for dizziness and mild  headache.  No other specific complaints in a complete review of systems (except as listed in HPI above).  Objective  Filed Vitals:   01/15/15 0818  BP: 134/68  Pulse: 89  Temp: 98 F (36.7 C)  TempSrc: Oral  Resp: 18  Height: 5' 9"  (1.753 m)  Weight: 223 lb 3.2 oz (101.243 kg)  SpO2: 98%    Body mass index is 32.95 kg/(m^2).  Physical Exam  Constitutional: Patient appears well-developed and well-nourished. Obese No distress.  HEENT: head atraumatic, normocephalic, pupils equal and reactive to light, neck supple, throat within normal limits Cardiovascular: Normal rate, regular rhythm and normal heart sounds.  No murmur heard. No BLE edema. Pulmonary/Chest: Effort normal and breath sounds normal. No respiratory distress. Abdominal: Soft.  There is no tenderness. Psychiatric: Patient has a normal mood and affect. behavior is normal. Judgment and thought  content normal.  Recent Results (from the past 2160 hour(s))  Glucose, capillary     Status: None   Collection Time: 10/22/14  1:09 PM  Result Value Ref Range   Glucose-Capillary 85 65 - 99 mg/dL  Comprehensive metabolic panel     Status: Abnormal   Collection Time: 10/22/14  1:10 PM  Result Value Ref Range   Sodium 132 (L) 135 - 145 mmol/L   Potassium 4.1 3.5 - 5.1 mmol/L   Chloride 96 (L) 101 - 111 mmol/L   CO2 23 22 - 32 mmol/L   Glucose, Bld 104 (H) 65 - 99 mg/dL   BUN 29 (H) 6 - 20 mg/dL   Creatinine, Ser 2.21 (H) 0.44 - 1.00 mg/dL   Calcium 9.2 8.9 - 10.3 mg/dL   Total Protein 8.7 (H) 6.5 - 8.1 g/dL   Albumin 3.6 3.5 - 5.0 g/dL   AST  36 15 - 41 U/L   ALT 43 14 - 54 U/L   Alkaline Phosphatase 129 (H) 38 - 126 U/L   Total Bilirubin 1.0 0.3 - 1.2 mg/dL   GFR calc non Af Amer 23 (L) >60 mL/min   GFR calc Af Amer 27 (L) >60 mL/min    Comment: (NOTE) The eGFR has been calculated using the CKD EPI equation. This calculation has not been validated in all clinical situations. eGFR's persistently <60 mL/min signify possible Chronic Kidney Disease.    Anion gap 13 5 - 15  CBC with Differential     Status: Abnormal   Collection Time: 10/22/14  1:10 PM  Result Value Ref Range   WBC 14.7 (H) 3.6 - 11.0 K/uL   RBC 4.17 3.80 - 5.20 MIL/uL   Hemoglobin 11.5 (L) 12.0 - 16.0 g/dL   HCT 35.8 35.0 - 47.0 %   MCV 85.9 80.0 - 100.0 fL   MCH 27.6 26.0 - 34.0 pg   MCHC 32.2 32.0 - 36.0 g/dL   RDW 14.7 (H) 11.5 - 14.5 %   Platelets 415 150 - 440 K/uL   Neutrophils Relative % 76 %   Neutro Abs 11.1 (H) 1.4 - 6.5 K/uL   Lymphocytes Relative 14 %   Lymphs Abs 2.1 1.0 - 3.6 K/uL   Monocytes Relative 10 %   Monocytes Absolute 1.5 (H) 0.2 - 0.9 K/uL   Eosinophils Relative 0 %   Eosinophils Absolute 0.1 0 - 0.7 K/uL   Basophils Relative 0 %   Basophils Absolute 0.0 0 - 0.1 K/uL  Culture, blood (routine x 2)     Status: None   Collection Time: 10/22/14  1:10 PM  Result Value Ref Range    Specimen Description BLOOD LEFT ARM    Special Requests BOTTLES DRAWN AEROBIC AND ANAEROBIC 3CC    Culture NO GROWTH 5 DAYS    Report Status 10/27/2014 FINAL   Culture, blood (routine x 2)     Status: None   Collection Time: 10/22/14  1:10 PM  Result Value Ref Range   Specimen Description BLOOD RIGHT HAND    Special Requests BOTTLES DRAWN AEROBIC AND ANAEROBIC  1CC    Culture NO GROWTH 5 DAYS    Report Status 10/27/2014 FINAL   Troponin I     Status: None   Collection Time: 10/22/14  1:10 PM  Result Value Ref Range   Troponin I <0.03 <0.031 ng/mL    Comment:        NO INDICATION OF MYOCARDIAL INJURY.   Lactic acid, plasma     Status: None   Collection Time: 10/22/14  1:20 PM  Result Value Ref Range   Lactic Acid, Venous 1.9 0.5 - 2.0 mmol/L  POCT Glucose (CBG)     Status: Abnormal   Collection Time: 10/22/14  1:32 PM  Result Value Ref Range   POC Glucose 108 (A) 70 - 99 mg/dl  Urinalysis complete, with microscopic ([facility] only)     Status: Abnormal   Collection Time: 10/22/14  3:02 PM  Result Value Ref Range   Color, Urine STRAW (A) YELLOW   APPearance CLEAR (A) CLEAR   Glucose, UA NEGATIVE NEGATIVE mg/dL   Bilirubin Urine NEGATIVE NEGATIVE   Ketones, ur NEGATIVE NEGATIVE mg/dL   Specific Gravity, Urine 1.002 (L) 1.005 - 1.030   Hgb urine dipstick 2+ (A) NEGATIVE   pH 7.0 5.0 - 8.0   Protein, ur NEGATIVE NEGATIVE mg/dL   Nitrite NEGATIVE NEGATIVE  Leukocytes, UA NEGATIVE NEGATIVE   RBC / HPF 0-5 0 - 5 RBC/hpf   WBC, UA 0-5 0 - 5 WBC/hpf   Bacteria, UA NONE SEEN NONE SEEN   Squamous Epithelial / LPF 0-5 (A) NONE SEEN  Glucose, capillary     Status: Abnormal   Collection Time: 10/22/14  5:01 PM  Result Value Ref Range   Glucose-Capillary 120 (H) 65 - 99 mg/dL  Magnesium     Status: None   Collection Time: 10/22/14  5:09 PM  Result Value Ref Range   Magnesium 1.9 1.7 - 2.4 mg/dL  Glucose, capillary     Status: None   Collection Time: 10/22/14  9:48 PM  Result Value  Ref Range   Glucose-Capillary 96 65 - 99 mg/dL   Comment 1 Notify RN   Basic metabolic panel     Status: Abnormal   Collection Time: 10/23/14  5:07 AM  Result Value Ref Range   Sodium 139 135 - 145 mmol/L   Potassium 4.3 3.5 - 5.1 mmol/L   Chloride 106 101 - 111 mmol/L   CO2 26 22 - 32 mmol/L   Glucose, Bld 123 (H) 65 - 99 mg/dL   BUN 20 6 - 20 mg/dL   Creatinine, Ser 1.39 (H) 0.44 - 1.00 mg/dL   Calcium 8.9 8.9 - 10.3 mg/dL   GFR calc non Af Amer 40 (L) >60 mL/min   GFR calc Af Amer 47 (L) >60 mL/min    Comment: (NOTE) The eGFR has been calculated using the CKD EPI equation. This calculation has not been validated in all clinical situations. eGFR's persistently <60 mL/min signify possible Chronic Kidney Disease.    Anion gap 7 5 - 15  CBC     Status: Abnormal   Collection Time: 10/23/14  5:07 AM  Result Value Ref Range   WBC 8.6 3.6 - 11.0 K/uL   RBC 3.96 3.80 - 5.20 MIL/uL   Hemoglobin 11.2 (L) 12.0 - 16.0 g/dL   HCT 34.1 (L) 35.0 - 47.0 %   MCV 86.1 80.0 - 100.0 fL   MCH 28.4 26.0 - 34.0 pg   MCHC 33.0 32.0 - 36.0 g/dL   RDW 14.9 (H) 11.5 - 14.5 %   Platelets 374 150 - 440 K/uL  Glucose, capillary     Status: Abnormal   Collection Time: 10/23/14  7:27 AM  Result Value Ref Range   Glucose-Capillary 116 (H) 65 - 99 mg/dL   Comment 1 Notify RN   Glucose, capillary     Status: None   Collection Time: 10/23/14 11:21 AM  Result Value Ref Range   Glucose-Capillary 76 65 - 99 mg/dL   Comment 1 Notify RN   Glucose, capillary     Status: Abnormal   Collection Time: 10/23/14  4:42 PM  Result Value Ref Range   Glucose-Capillary 101 (H) 65 - 99 mg/dL   Comment 1 Notify RN   Glucose, capillary     Status: Abnormal   Collection Time: 10/23/14  9:23 PM  Result Value Ref Range   Glucose-Capillary 105 (H) 65 - 99 mg/dL  Cortisol     Status: None   Collection Time: 10/24/14  5:43 AM  Result Value Ref Range   Cortisol, Plasma 21.2 ug/dL    Comment: (NOTE) AM    6.7 - 22.6  ug/dL PM   <10.0       ug/dL Performed at [facility]   CBC     Status: Abnormal  Collection Time: 10/24/14  5:44 AM  Result Value Ref Range   WBC 5.7 3.6 - 11.0 K/uL   RBC 3.92 3.80 - 5.20 MIL/uL   Hemoglobin 11.1 (L) 12.0 - 16.0 g/dL   HCT 33.8 (L) 35.0 - 47.0 %   MCV 86.2 80.0 - 100.0 fL   MCH 28.2 26.0 - 34.0 pg   MCHC 32.7 32.0 - 36.0 g/dL   RDW 14.7 (H) 11.5 - 14.5 %   Platelets 340 150 - 440 K/uL  Basic metabolic panel     Status: Abnormal   Collection Time: 10/24/14  5:44 AM  Result Value Ref Range   Sodium 142 135 - 145 mmol/L   Potassium 4.3 3.5 - 5.1 mmol/L   Chloride 110 101 - 111 mmol/L   CO2 26 22 - 32 mmol/L   Glucose, Bld 117 (H) 65 - 99 mg/dL   BUN 12 6 - 20 mg/dL   Creatinine, Ser 1.10 (H) 0.44 - 1.00 mg/dL   Calcium 9.0 8.9 - 10.3 mg/dL   GFR calc non Af Amer 53 (L) >60 mL/min   GFR calc Af Amer >60 >60 mL/min    Comment: (NOTE) The eGFR has been calculated using the CKD EPI equation. This calculation has not been validated in all clinical situations. eGFR's persistently <60 mL/min signify possible Chronic Kidney Disease.    Anion gap 6 5 - 15  Glucose, capillary     Status: Abnormal   Collection Time: 10/24/14  7:32 AM  Result Value Ref Range   Glucose-Capillary 111 (H) 65 - 99 mg/dL   Comment 1 Notify RN   Glucose, capillary     Status: Abnormal   Collection Time: 10/24/14 11:47 AM  Result Value Ref Range   Glucose-Capillary 114 (H) 65 - 99 mg/dL   Comment 1 Notify RN   Glucose, capillary     Status: None   Collection Time: 10/24/14  4:35 PM  Result Value Ref Range   Glucose-Capillary 86 65 - 99 mg/dL   Comment 1 Notify RN   Glucose, capillary     Status: None   Collection Time: 10/24/14  9:25 PM  Result Value Ref Range   Glucose-Capillary 82 65 - 99 mg/dL   Comment 1 Notify RN   CBC     Status: Abnormal   Collection Time: 10/25/14  7:39 AM  Result Value Ref Range   WBC 6.2 3.6 - 11.0 K/uL   RBC 3.99 3.80 - 5.20 MIL/uL    Hemoglobin 11.2 (L) 12.0 - 16.0 g/dL   HCT 34.2 (L) 35.0 - 47.0 %   MCV 85.9 80.0 - 100.0 fL   MCH 28.2 26.0 - 34.0 pg   MCHC 32.8 32.0 - 36.0 g/dL   RDW 14.3 11.5 - 14.5 %   Platelets 362 150 - 440 K/uL  Basic metabolic panel     Status: Abnormal   Collection Time: 10/25/14  7:39 AM  Result Value Ref Range   Sodium 140 135 - 145 mmol/L   Potassium 4.3 3.5 - 5.1 mmol/L   Chloride 107 101 - 111 mmol/L   CO2 25 22 - 32 mmol/L   Glucose, Bld 115 (H) 65 - 99 mg/dL   BUN 10 6 - 20 mg/dL   Creatinine, Ser 0.86 0.44 - 1.00 mg/dL   Calcium 9.4 8.9 - 10.3 mg/dL   GFR calc non Af Amer >60 >60 mL/min   GFR calc Af Amer >60 >60 mL/min    Comment: (NOTE) The  eGFR has been calculated using the CKD EPI equation. This calculation has not been validated in all clinical situations. eGFR's persistently <60 mL/min signify possible Chronic Kidney Disease.    Anion gap 8 5 - 15  Glucose, capillary     Status: Abnormal   Collection Time: 10/25/14  7:50 AM  Result Value Ref Range   Glucose-Capillary 113 (H) 65 - 99 mg/dL   Comment 1 Notify RN   CBC with Differential/Platelet     Status: Abnormal   Collection Time: 11/05/14 12:50 PM  Result Value Ref Range   WBC 15.3 (H) 3.4 - 10.8 x10E3/uL   RBC 4.23 3.77 - 5.28 x10E6/uL   Hemoglobin 11.7 11.1 - 15.9 g/dL   Hematocrit 35.2 34.0 - 46.6 %   MCV 83 79 - 97 fL   MCH 27.7 26.6 - 33.0 pg   MCHC 33.2 31.5 - 35.7 g/dL   RDW 15.0 12.3 - 15.4 %   Platelets 308 150 - 379 x10E3/uL   Neutrophils 73 %   Lymphs 15 %   Monocytes 11 %   Eos 1 %   Basos 0 %   Neutrophils Absolute 11.2 (H) 1.4 - 7.0 x10E3/uL   Lymphocytes Absolute 2.3 0.7 - 3.1 x10E3/uL   Monocytes Absolute 1.7 (H) 0.1 - 0.9 x10E3/uL   EOS (ABSOLUTE) 0.1 0.0 - 0.4 x10E3/uL   Basophils Absolute 0.0 0.0 - 0.2 x10E3/uL   Immature Granulocytes 0 %   Immature Grans (Abs) 0.0 0.0 - 0.1 x10E3/uL  Comprehensive metabolic panel     Status: Abnormal   Collection Time: 11/05/14 12:50 PM  Result  Value Ref Range   Glucose 107 (H) 65 - 99 mg/dL   BUN 9 8 - 27 mg/dL   Creatinine, Ser 0.77 0.57 - 1.00 mg/dL   GFR calc non Af Amer 84 >59 mL/min/1.73   GFR calc Af Amer 97 >59 mL/min/1.73   BUN/Creatinine Ratio 12 11 - 26   Sodium 139 134 - 144 mmol/L   Potassium 5.3 (H) 3.5 - 5.2 mmol/L   Chloride 96 (L) 97 - 108 mmol/L   CO2 22 18 - 29 mmol/L   Calcium 9.8 8.7 - 10.3 mg/dL   Total Protein 7.8 6.0 - 8.5 g/dL   Albumin 3.9 3.6 - 4.8 g/dL   Globulin, Total 3.9 1.5 - 4.5 g/dL   Albumin/Globulin Ratio 1.0 (L) 1.1 - 2.5   Bilirubin Total 0.7 0.0 - 1.2 mg/dL   Alkaline Phosphatase 123 (H) 39 - 117 IU/L   AST 17 0 - 40 IU/L   ALT 29 0 - 32 IU/L  H. pylori antibody, IgG     Status: None   Collection Time: 11/05/14 12:50 PM  Result Value Ref Range   H Pylori IgG <0.9 0.0 - 0.8 U/mL    Comment:                              Negative            <0.9                              Indeterminate  0.9 - 1.0                              Positive            >  1.0   Lipase     Status: None   Collection Time: 11/05/14 12:50 PM  Result Value Ref Range   Lipase 29 0 - 59 U/L  Urine culture     Status: None   Collection Time: 11/06/14 12:44 PM  Result Value Ref Range   Urine Culture, Routine Final report    Urine Culture result 1 Comment     Comment: Mixed urogenital flora Less than 10,000 colonies/mL   Urinalysis, Complete     Status: Abnormal   Collection Time: 11/06/14 12:44 PM  Result Value Ref Range   Specific Gravity, UA 1.028 1.005 - 1.030   pH, UA 6.0 5.0 - 7.5   Color, UA Yellow Yellow   Appearance Ur Turbid (A) Clear   Leukocytes, UA Negative Negative   Protein, UA 1+ (A) Negative/Trace   Glucose, UA Negative Negative   Ketones, UA Trace (A) Negative   RBC, UA Negative Negative   Bilirubin, UA Negative Negative   Urobilinogen, Ur 1.0 0.2 - 1.0 mg/dL   Nitrite, UA Positive (A) Negative   Microscopic Examination See below:     Comment: Microscopic was indicated and was  performed.  Microscopic Examination     Status: Abnormal   Collection Time: 11/06/14 12:44 PM  Result Value Ref Range   WBC, UA 0-5 0 -  5 /hpf   RBC, UA 0-2 0 -  2 /hpf   Epithelial Cells (non renal) 0-10 0 - 10 /hpf   Casts None seen None seen /lpf   Crystals Present (A) N/A   Crystal Type Calcium Oxalate N/A   Mucus, UA Present Not Estab.   Bacteria, UA Few None seen/Few  Coagulation Panel     Status: Abnormal   Collection Time: 11/07/14  2:23 PM  Result Value Ref Range   Homocysteine 8.7 0.0 - 15.0 umol/L   Thrombin Time 15.1 0.0 - 20.0 sec   Factor V Activity 108 60 - 140 %   PT 13.4 11.9 - 14.1 sec   INR 1.0 0.8 - 1.2    Comment: Reference interval is for non-anticoagulated patients. Suggested INR therapeutic range for Vitamin K antagonist therapy:    Standard Dose (moderate intensity                   therapeutic range):       2.0 - 3.0    Higher intensity therapeutic range       2.5 - 3.5    APTT PPP 30.2 23.4 - 36.4 sec   AntiThromb III Func 101 75 - 135 %   AT III AG PPP IMM-ACNC 88 75 - 130 %   Protein C Antigen 88 70 - 140 %   Protein C Activity 111 74 - 151 %   Protein S Ag, Total 131 58 - 150 %   Protein S Ag, Free 108 56 - 124 %   Protein S Activity 38 (L) 60 - 145 %    Comment: A deficiency of protein S (PS), either congenital or acquired, increases the risk of thromboembolism. Congenital deficiencies of PS are very rare; acquired PS deficiency is much more common. Acquired deficiency can occur as the result of decreased PS synthesis or increased consumption. PS synthesis can be diminished in a number of conditions including anti-vitamin K (warfarin) therapy, vitamin K deficiency, severe liver disease, and malnutrition. PS levels decrease with normal pregnancy. Levels may be spuriously decreased in individuals with Factor V Leiden. Levels may be decreased  in nephrotic syndrome, women on oral contraceptive/hormone replacement therapy and in patients  receiving chemotherapy or L-asparaginse therapy. PS consumption can occur during disseminated intravascular coagulation (DIC) and acute thrombosis. It has been suggested that repeat blood sampling and testing after ruling out acquired causes of deficiency should be performed before the patient is dia gnosed with congenital Protein S deficiency. **Verified by repeat analysis**    PTT Lupus Anticoagulant 40.9 0.0 - 50.0 sec   Dilute Viper Venom Time 33.8 0.0 - 55.1 sec   Lupus Anticoag Interp Comment:     Comment: No lupus anticoagulant was detected.   dPT 43.5 0.0 - 55.0 sec   dPT Confirm Ratio 1.00 0.00 - 1.40 Ratio   Anticardiolipin IgG <9 0 - 14 GPL U/mL    Comment:                           Negative:              <15                           Indeterminate:     15 - 20                           Low-Med Positive: >20 - 80                           High Positive:         >80    Anticardiolipin IgM 9 0 - 12 MPL U/mL    Comment:                           Negative:              <13                           Indeterminate:     13 - 20                           Low-Med Positive: >20 - 80                           High Positive:         >80    Anticardiolipin IgA <9 0 - 11 APL U/mL    Comment:                           Negative:              <12                           Indeterminate:     12 - 20                           Low-Med Positive: >20 - 80                           High Positive:         >80    FACTOR V LEIDEN  Comment (A)     Comment: 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 Factor V Leiden is more resistant to inactivation by activated protein C.  As a result, factor V persists in the circulation leading to a mild hypercoagulable state.  Factor V Leiden has been reported in patients with deep vein thrombosis, pulmonary  embolus, central retinal vein occulsion, cerebral sinus thrombosis, and hepatic vein thrombosis. The relative risk of venous thrombosis is increased approximately 4-8 fold in individuals who are heterozygous.  About 3-8% of the general [place] and European population are heterozygous.  The risk of venous thrombosis increases exponentially in patients with more than one risk factor, including:  age, surgery, oral contraceptive use, pregnancy, elevated homocysteine levels, or a Factor II/prothrombin mutation (G20210A).  Additionally, for individuals  found to be heterozygous for the Factor V Leiden mutation, presence of a second mutation, Factor V R2, further increases the risk if venous thrombosis. Contact [facility]'s Genetics Customer Service at [phone] for further information on both the Factor II (Prothrombin) DNA Analysis, and Factor V R2 DNA Analysis tests. **Genetic counselors are available for health care providers to**   discuss results at [phone]). Methodology: DNA analysis of the Factor V gene was performed by allele-specific PCR. The diagnostic sensitivity and specificity is >99% for both. Molecular-based testing is highly accurate, but as in any laboratory test, diagnostic errors may occur. All test results must be combined with clinical information for the most accurate interpretation. References: Voelkerding K (1996).  Clin Lab Med [phone]. Allison Quarry, PhD, [facility] Ruben Reason, PhD, [facility] Jens Som, PhD, [facility] Annetta Maw, M.S., PhD, [facility] Hillery Jacks s, PhD, [facility] Norva Riffle, PhD, [facility] Earlean Polka PhD, [facility]    Factor II, DNA Analysis Comment     Comment: NEGATIVE No mutation identified. Comment: A point mutation (G20210A) in the factor II (prothrombin) gene is the second most common cause of inherited thrombophilia. The incidence of this mutation in the [place] Caucasian population is about 2% and in the [place]  American population it is approximately 0.5%. This mutation is rare in the [place] and Native American population. Being heterozygous for a prothrombin mutation increases the risk for developing venous thrombosis about 2 to 3 times above the general population risk. Being homozygous for the prothrombin gene mutation increases the relative risk for venous thrombosis further, although it is not yet known how much further the risk is increased. In women heterozygous for the prothrombin gene mutation, the use of estrogen containing oral contraceptives increases the relative risk of venous thrombosis about 16 times and the risk of developing cerebral thrombosis is also significantly increased. In pregnancy the prothrombin  gene mutation increases risk for venous thrombosis and may increase risk for stillbirth, placental abruption, pre-eclampsia and fetal growth restriction. If the patient possesses two or more congenital or acquired thrombophilic risk factors, the risk for thrombosis may rise to more than the sum of the risk ratios for the individual mutations. This assay detects only the prothrombin G20210A mutation and does not measure genetic abnormalities elsewhere in the genome. Other thrombotic risk factors may be pursued through systematic clinical laboratory analysis. These factors include the R506Q (Leiden) mutation in the Factor V gene, plasma homocysteine levels, as well as testing for deficiencies of antithrombin III, protein C and protein S. Genetic Counselors are available for health care providers  to discuss results at [phone]). Methodology: DNA analysis of the Factor II gene was performed by PCR amplification followed by restriction analysis. The diagnostic  sensitivity is >99% for both. All the tests must be combined with clinical information for the most accurate interpretation. Molecular-based testing is highly accurate, but as in any laboratory test,  diagnostic errors may occur. Poort SR, et al. Blood. 1996; 06:2694-8546. Varga EA. Circulation. 2004; 270:J50-K93. Mervin Hack, et Hardy; 19:700-703. Allison Quarry, PhD, [facility] Ruben Reason, PhD, [facility] Jens Som, PhD, [facility] Annetta Maw, M.S., PhD, [facility] Alfredo Bach, PhD, [facility] Norva Riffle, PhD, [facility] Earlean Polka, PhD, [facility]   Iron and TIBC     Status: Abnormal   Collection Time: 11/27/14 10:25 AM  Result Value Ref Range   Iron 41 28 - 170 ug/dL   TIBC 395 250 - 450 ug/dL   Saturation Ratios 10 (L) 10.4 - 31.8 %   UIBC 354 ug/dL  Ferritin     Status: None   Collection Time: 11/27/14 10:25 AM  Result Value Ref Range   Ferritin 39 11 - 307 ng/mL  Reticulocytes     Status: None   Collection Time: 11/27/14 10:25 AM  Result Value Ref Range   Retic Ct Pct 1.2 0.4 - 3.1 %   RBC. 4.96 3.80 - 5.20 MIL/uL   Retic Count, Manual 59.5 19.0 - 183.0 K/uL  Vitamin B12     Status: None   Collection Time: 11/27/14 10:25 AM  Result Value Ref Range   Vitamin B-12 758 180 - 914 pg/mL    Comment: (NOTE) This assay is not validated for testing neonatal or myeloproliferative syndrome specimens for Vitamin B12 levels. Performed at [facility]   Folate     Status: None   Collection Time: 11/27/14 10:25 AM  Result Value Ref Range   Folate 26.0 >5.9 ng/mL  Protein electrophoresis, serum     Status: None   Collection Time: 11/27/14 10:25 AM  Result Value Ref Range   Total Protein ELP 7.0 6.0 - 8.5 g/dL   Albumin ELP 3.4 2.9 - 4.4 g/dL   Alpha-1-Globulin 0.3 0.0 - 0.4 g/dL   Alpha-2-Globulin 0.7 0.4 - 1.0 g/dL   Beta Globulin 1.1 0.7 - 1.3 g/dL   Gamma Globulin 1.5 0.4 - 1.8 g/dL   M-Spike, % Not Observed Not Observed g/dL   SPE Interp. Comment     Comment: (NOTE) The SPE pattern appears essentially unremarkable. Evidence of monoclonal protein is not apparent. Performed At: [facility] [place], [place] [id number] Lindon Romp MD IR:[id number]    Comment Comment     Comment: (NOTE) Protein electrophoresis scan will follow via computer, mail, or courier delivery.    GLOBULIN, TOTAL 3.6 2.2 - 3.9 g/dL   A/G Ratio 0.9 0.7 - 1.7  Kappa/lambda light chains     Status: Abnormal   Collection Time: 11/27/14 10:25 AM  Result Value Ref Range   Kappa free light chain 26.67 (H) 3.30 - 19.40 mg/L   Lamda free light chains 24.67 5.71 - 26.30 mg/L   Kappa, lamda light chain ratio 1.08 0.26 - 1.65    Comment: (NOTE) Performed At: [facility] [address] [place], [place] [id number] Lindon Romp MD PO:[id number]   IgG, IgA, IgM     Status: Abnormal   Collection Time: 11/27/14 10:25 AM  Result Value Ref Range   IgG (Immunoglobin G), Serum  1277 700 - 1600 mg/dL   IgA 274 87 - 352 mg/dL   IgM, Serum 247 (H) 26 - 217 mg/dL    Comment: (NOTE) Performed At: [facility] [place], [place] [id number] Lindon Romp MD [id number]   CBC with Differential     Status: Abnormal   Collection Time: 11/27/14 10:25 AM  Result Value Ref Range   WBC 7.8 3.6 - 11.0 K/uL   RBC 4.96 3.80 - 5.20 MIL/uL   Hemoglobin 13.4 12.0 - 16.0 g/dL   HCT 41.3 35.0 - 47.0 %   MCV 83.3 80.0 - 100.0 fL   MCH 27.0 26.0 - 34.0 pg   MCHC 32.4 32.0 - 36.0 g/dL   RDW 16.3 (H) 11.5 - 14.5 %   Platelets 216 150 - 440 K/uL   Neutrophils Relative % 61 %   Neutro Abs 4.8 1.4 - 6.5 K/uL   Lymphocytes Relative 29 %   Lymphs Abs 2.2 1.0 - 3.6 K/uL   Monocytes Relative 8 %   Monocytes Absolute 0.6 0.2 - 0.9 K/uL   Eosinophils Relative 1 %   Eosinophils Absolute 0.1 0 - 0.7 K/uL   Basophils Relative 1 %   Basophils Absolute 0.1 0 - 0.1 K/uL  Protein Electro, 24-Hour Urine     Status: None   Collection Time: 11/27/14 10:25 AM  Result Value Ref Range   Total Protein, Urine NUSR mg/dL    Comment: (NOTE) No urine specimen received. Notified Dessie Coma 11/30/2014     Albumin, U TNP     Comment: Test not performed   Alpha 1, Urine TNP     Comment: Test not performed   Alpha 2, Urine TNP     Comment: Test not performed   Beta, Urine TNP     Comment: Test not performed   Gamma Globulin, Urine TNP     Comment: Test not performed   PLEASE NOTE: Comment     Comment: (NOTE) Protein electrophoresis scan will follow via computer, mail, or courier delivery. Performed At: [facility] [place], [place] [id number] Lindon Romp MD [id number]   Protein Electro, 24-Hour Urine     Status: Abnormal   Collection Time: 11/29/14  7:00 AM  Result Value Ref Range   Total Protein, Urine 11.6 Not Estab. mg/dL   Total Protein, Urine-Ur/day 359.6 (H) 30.0 - 150.0 mg/24 hr   Albumin, U 100.0 %   Alpha 1, Urine 0.0 %   Alpha 2, Urine 0.0 %   Beta, Urine 0.0 %   Gamma Globulin, Urine 0.0 %   M-spike, % Not Observed Not Observed %   PLEASE NOTE: Comment     Comment: (NOTE) Protein electrophoresis scan will follow via computer, mail, or courier delivery. Performed At: [facility] [place], [place] [id number] Lindon Romp MD [id number]    Total Volume 3100   ANA w/Reflex     Status: None   Collection Time: 12/11/14  4:25 PM  Result Value Ref Range   Anit Nuclear Antibody(ANA) Negative Negative    Comment: (NOTE) Performed At: [facility] [place], [place] [id number] Lindon Romp MD VX:[id number]   Sedimentation rate     Status: None   Collection Time: 12/11/14  4:25 PM  Result Value Ref Range   Sed Rate 27 0 - 30 mm/hr  Protein S, total and free     Status: None   Collection Time: 12/11/14  4:25 PM  Result Value Ref Range   Protein S Ag, Total 95 58 - 150 %    Comment: (NOTE) **Effective December 31, 2014 Protein S, Total**  reference interval will be changing to:                            0 - 3 days       27 -  55                       4 days - 6 months     44 -  111                               >6 months     60 - 150    Protein S Ag, Free 77 56 - 124 %    Comment: (NOTE) **Effective December 31, 2014 Protein S, Free reference**  interval will be changing to:                            0 - 3 days       29 -  54                       4 days - 6 months     38 - 115                               >6 months     57 - 157 Performed At: [facility] [place], [place] [id number] Lindon Romp MD QM:[id number]   Protein S activity     Status: None   Collection Time: 12/11/14  4:25 PM  Result Value Ref Range   Protein S Activity 69 60 - 145 %    Comment: (NOTE) **Effective December 31, 2014 Protein S-Functional**  reference interval will be changing to:                            0 - 3 days       26 -  54                       4 days - 6 months     40 -  98                               >6 months     63 - 140 Performed At: [facility] [place], [place] [id number] Lindon Romp MD [id number]   [facility] Profile (-High Sensitivity)     Status: None   Collection Time: 12/11/14  4:25 PM  Result Value Ref Range   Interpretation Comment     Comment: (NOTE) Peripheral Blood: No detected deficiency of the GPI-Anchor and GPI-Anchored markers tested/No immunophenotypic evidence of Paroxysmal Nocturnal Hemoglobinuria.    Comment: Comment     Comment: (NOTE) At the sensitivity levels of this assay (<0.1%), these results do not support a diagnosis of paroxysmal nocturnal hemoglobinuria (PNH). Correlation with available clinical, laboratory, and morphologic data is recommended.    Specimen: Peripheral Blood    Submitted  Dx: Comment     Comment: Evaluation for paroxysmal nocturnal hemoglobinuria (PNH)   Viability: 97%     Comment: (7AAD exclusion)   Cell Population Comment     Comment:                          Population Analysis   Granulocytes: Comment     Comment: (NOTE) No detected deficiency of the  GPI-Anchor or GPI-Anchored markers tested.    Monocytes: Comment     Comment: (NOTE) No detected deficiency of the GPI-Anchor or GPI-Anchored markers tested.    Antibodies Performed: Comment     Comment: CD14, CD15, CD24, CD45, CD64, FLAER   Comment: Comment     Comment: (NOTE) This test was developed and its performance characteristics determined by [facility]. It has not been cleared or approved by the Food and Drug Administration (FDA). The FDA has determined that such clearance or approval is not necessary. Any image(s) that accompany this report is/are a representative image(s) only and should not be used to render a diagnosis. Performed At: ;# [facility] [address] [place], [place] [id number] Ebony Hail MD AN:[id number] Performed At: [facility] [address] [place], [place] [id number] Lindon Romp MD [id number]   POCT HgB A1C     Status: None   Collection Time: 01/15/15  8:28 AM  Result Value Ref Range   Hemoglobin A1C 5.8   POCT UA - Microalbumin     Status: Normal   Collection Time: 01/15/15  8:29 AM  Result Value Ref Range   Microalbumin Ur, POC negative mg/L   Creatinine, POC  mg/dL   Albumin/Creatinine Ratio, Urine, POC      Diabetic Foot Exam - Simple   Simple Foot Form  Diabetic Foot exam was performed with the following findings:  Yes 01/15/2015  9:05 AM  Visual Inspection  No deformities, no ulcerations, no other skin breakdown bilaterally:  Yes  Sensation Testing  Intact to touch and monofilament testing bilaterally:  Yes  Pulse Check  Posterior Tibialis and Dorsalis pulse intact bilaterally:  Yes  Comments    '  PHQ2/9: Depression screen PHQ 2/9 10/08/2014  Decreased Interest 0  Down, Depressed, Hopeless 0  PHQ - 2 Score 0     Fall Risk: Fall Risk  10/08/2014  Falls in the past year? No  [  Assessment & Plan  1. Type 2 diabetes mellitus with microalbuminuria, without long-term current use  of insulin (HCC)  At goal, continue diet, yearly eye exam and ARB - POCT HgB A1C - POCT UA - Microalbumin  2. Benign essential HTN  bp running up to 149 we will adjust dose to 100 mg , but can take half if develops orthostatic changes - losartan (COZAAR) 100 MG tablet; Take 1 tablet (100 mg total) by mouth daily.  Dispense: 90 tablet; Refill: 1  3. Portal vein thrombosis  Continue follow up with hematologist  4. Gastro-esophageal reflux disease without esophagitis  Doing well, off Omeprazole  5. Dyslipidemia  Change from Simvastatin to Lipitor and recheck labs in FEb - atorvastatin (LIPITOR) 40 MG tablet; Take 1 tablet (40 mg total) by mouth daily.  Dispense: 90 tablet; Refill: 1  6. Microalbuminuria   today is zero, continue ARB  7. Protein-calorie malnutrition, severe ([place])  Resolved, secondary to acute illness this past August  8. Perennial allergic rhinitis with seasonal variation  Resume nasal spray  9.  Need for shingles vaccine  - Varicella-zoster vaccine subcutaneous  10. Need for pneumococcal vaccination  - Pneumococcal polysaccharide vaccine 23-valent greater than or equal to 2yo subcutaneous/IM  11. Vertigo  Likely BPPV - Ambulatory referral to ENT

## 2015-02-25 ENCOUNTER — Telehealth: Payer: Self-pay | Admitting: Family Medicine

## 2015-03-27 ENCOUNTER — Other Ambulatory Visit: Payer: Self-pay | Admitting: *Deleted

## 2015-03-27 ENCOUNTER — Encounter: Payer: Self-pay | Admitting: *Deleted

## 2015-03-27 DIAGNOSIS — I81 Portal vein thrombosis: Secondary | ICD-10-CM

## 2015-03-29 NOTE — Telephone Encounter (Signed)
errenous °

## 2015-04-21 ENCOUNTER — Encounter: Payer: Self-pay | Admitting: Hematology and Oncology

## 2015-04-21 NOTE — Progress Notes (Signed)
[facility] day:  12/11/2014  Chief Complaint: Emily Pitts is a 60 y.o. female with portal vein thrombosis who is seen for review of hypercoagulable work-up and discussion regarding direction of therapy.  HPI:   The patient was last seen in the hemaology clinic on 11/27/2014.  At that time, she was seen for initial consultation regarding hepatic vein thrombosis in the setting of 2 admissions for urinary tract infections (negative urine culture), dehydration, nausea, abdominal discomfort, fever, and chills.  She had been placed on Xarelto.  Initial hypercoagulable work-up revealed heterozygosity for Factor V Leiden and a low protein S activity in the setting of an acute clot.  She was also noted to have an improving normocytic anemia.  She underwent further testing on 11/27/2014.  CBC revealed a hematocrit of 41.3, hemoglobin 13.4, MCV 83.3, platelets 216,000 white count 7800 with an ANC of 4800. Differential was unremarkable.  Ferritin was 39. Reticulocyte count was 1.2%. B12 was 758.  Folate was 26.  Serum protein electrophoresis revealed no monoclonal protein.  Kappa free light chains were 26.67, lambda free light chains 24.67 and ratio 1.08 (normal).   Immunoglobulin levels included a slightly high IgM level of 247 (26-247).  PNH testing was inadvertently not done.  24 hour urine testing is pending.  Symptomatically, she notes no issues or problems. Weight is stable.  Past Medical History  Diagnosis Date  . Hypertension   . Hyperlipidemia   . Allergy   . Diabetes mellitus without complication ([place]) 6160  . Vitamin D deficiency   . Numbness of feet   . Low back pain, episodic     Past Surgical History  Procedure Laterality Date  . Appendectomy  1966  . Abdominal hysterectomy  2010  . Dilation and curettage of uterus    . Colonoscopy  06-07-13    Dr Bary Castilla    Family History  Problem Relation Age of Onset  . Diabetes Mother   . COPD  Mother   . Diabetes Father   . Hypertension Father   . Cancer Father     Kidney and Prostate  . CAD Father   . Diabetes Brother     Oldest Brother  Her grandfather had a CVA in his 70s.  A niece had an early pregnancy loss.   Social History:  reports that she has never smoked. She has never used smokeless tobacco. She reports that she does not drink alcohol or use illicit drugs.  She does not have any children.  The patient is alone today.  Allergies: No Known Allergies  Current Medications: Current Outpatient Prescriptions  Medication Sig Dispense Refill  . aspirin EC 81 MG tablet Take 81 mg by mouth daily.    . fluticasone (FLONASE) 50 MCG/ACT nasal spray Place 2 sprays into both nostrils as needed.    Marland Kitchen glucose blood (ONE TOUCH ULTRA TEST) test strip Use as instructed 100 each 12  . levocetirizine (XYZAL) 5 MG tablet TAKE ONE TABLET BY MOUTH ONCE DAILY 90 tablet 1  . magnesium oxide (MAG-OX) 400 MG tablet Take 400 mg by mouth daily.    . rivaroxaban (XARELTO) 20 MG TABS tablet Take 1 tablet (20 mg total) by mouth daily with supper. 30 tablet 5  . vitamin B-12 (CYANOCOBALAMIN) 1000 MCG tablet Take 1,000 mcg by mouth daily.    . vitamin C (ASCORBIC ACID) 500 MG tablet Take 500 mg by mouth daily.    Marland Kitchen atorvastatin (LIPITOR) 40  MG tablet Take 1 tablet (40 mg total) by mouth daily. 90 tablet 1  . losartan (COZAAR) 100 MG tablet Take 1 tablet (100 mg total) by mouth daily. 90 tablet 1   No current facility-administered medications for this visit.    Review of Systems:  GENERAL:  Feels good.  No fevers or sweats.  Initial weight loss with recent weight gain (2 pounds since last visit). PERFORMANCE STATUS (ECOG):  0 HEENT:  No visual changes, runny nose, sore throat, mouth sores or tenderness. Lungs: No shortness of breath or cough.  No hemoptysis. Cardiac:  No chest pain, palpitations, orthopnea, or PND. GI:  No nausea, vomiting, diarrhea, constipation, melena or hematochezia.  Colonoscopy in 06/2013. GU:  No urgency, frequency, dysuria, or hematuria. Musculoskeletal:  No back pain.  No joint pain.  No muscle tenderness. Extremities:  No pain or swelling. Skin:  No rashes or skin changes. Neuro:  No headache, numbness or weakness, balance or coordination issues. Endocrine:  Diabetes.  No thyroid issues, hot flashes or night sweats. Psych:  No mood changes, depression or anxiety. Pain:  No focal pain. Review of systems:  All other systems reviewed and found to be negative.  Physical Exam: Blood pressure 163/79, pulse 74, temperature 98.2 F (36.8 C), temperature source Oral, height _0  (1.753 m), weight 219 lb 7.5 oz (99.55 kg). GENERAL:  Well developed, well nourished, sitting comfortably in the exam room in no acute distress. MENTAL STATUS:  Alert and oriented to person, place and time. HEAD:  Short styled strawberry blonde hair.  Normocephalic, atraumatic, face symmetric, no Cushingoid features. EYES:  Blue eyes.  Pupils equal round and reactive to light and accomodation.  No conjunctivitis or scleral icterus. ENT:  Oropharynx clear without lesion.  Tongue normal. Mucous membranes moist.  RESPIRATORY:  Clear to auscultation without rales, wheezes or rhonchi. CARDIOVASCULAR:  Regular rate and rhythm without murmur, rub or gallop. ABDOMEN:  Soft, non-tender, with active bowel sounds, and no appreciablehepatosplenomegaly.  No masses. SKIN:  No rashes, ulcers or lesions. EXTREMITIES: No edema, no skin discoloration or tenderness.  No palpable cords. LYMPH NODES: No palpable cervical, supraclavicular, axillary or inguinal adenopathy  NEUROLOGICAL: Unremarkable. PSYCH:  Appropriate.  Appointment on 12/11/2014  Component Date Value Ref Range Status  . Anit Nuclear Antibody(ANA) 12/11/2014 Negative  Negative Final   Comment: (NOTE) Performed At: [facility] [place], [place] [id number] Lindon Romp MD [id number]   . Sed Rate  12/11/2014 27  0 - 30 mm/hr Final  . Protein S Ag, Total 12/11/2014 95  58 - 150 % Final   Comment: (NOTE) **Effective December 31, 2014 Protein S, Total**  reference interval will be changing to:                            0 - 3 days       27 -  55                       4 days - 6 months     44 - 111                               >6 months     60 - 150   . Protein S Ag, Free 12/11/2014 77  56 - 124 % Final   Comment: (NOTE) **Effective December 31, 2014 Protein S, Free reference**  interval will be changing to:                            0 - 3 days       29 -  54                       4 days - 6 months     38 - 115                               >6 months     57 - 157 Performed At: [facility] [place], [place] [id number] Lindon Romp MD [id number]:[id number]   . Protein S Activity 12/11/2014 69  60 - 145 % Final   Comment: (NOTE) **Effective December 31, 2014 Protein S-Functional**  reference interval will be changing to:                            0 - 3 days       26 -  54                       4 days - 6 months     40 -  98                               >6 months     63 - 140 Performed At: [facility] [place], [place] [id number] Lindon Romp MD [id number]   . Interpretation 12/11/2014 Comment   Final   Comment: (NOTE) Peripheral Blood: No detected deficiency of the GPI-Anchor and GPI-Anchored markers tested/No immunophenotypic evidence of Paroxysmal Nocturnal Hemoglobinuria.   . Comment: 12/11/2014 Comment   Final   Comment: (NOTE) At the sensitivity levels of this assay (<0.1%), these results do not support a diagnosis of paroxysmal nocturnal hemoglobinuria (PNH). Correlation with available clinical, laboratory, and morphologic data is recommended.   Marland Kitchen Specimen: 12/11/2014 Peripheral Blood   Final  . Submitted Dx: 12/11/2014 Comment   Final   Evaluation for paroxysmal nocturnal hemoglobinuria (PNH)  . Viability:  12/11/2014 97%   Final   (7AAD exclusion)  . Cell Population 12/11/2014 Comment   Final                            Population Analysis  . Granulocytes: 12/11/2014 Comment   Final   Comment: (NOTE) No detected deficiency of the GPI-Anchor or GPI-Anchored markers tested.   . Monocytes: 12/11/2014 Comment   Final   Comment: (NOTE) No detected deficiency of the GPI-Anchor or GPI-Anchored markers tested.   Marland Kitchen Antibodies Performed: 12/11/2014 Comment   Final   CD14, CD15, CD24, CD45, CD64, FLAER  . Comment: 12/11/2014 Comment   Final   Comment: (NOTE) This test was developed and its performance characteristics determined by [facility]. It has not been cleared or approved by the Food and Drug Administration (FDA). The FDA has determined that such clearance or approval is not necessary. Any image(s) that accompany this report is/are a representative image(s) only and should not be used to render a diagnosis. Performed At: ;# [facility] [address]  [address] [place], [place] [id number] Ebony Hail MD [id number] Performed At: [facility] [address] [place], [place] [id number] Lindon Romp MD [id number]     Assessment:  CLOMA RAHRIG is a 60 y.o. female with portal vein thrombosis presenting with fever, chills, dehydration, weight loss, and abdominal pain.  Symptoms lasted for about 4 weeks.  Abdominal and pelvic CT on 11/07/2014 revealed a partially thrombosed portal veins (left > right).  The main portal vein and mesenteric veins remained patent.  Porta hepatis nodes measured 1.2 cm in short axis (stable).  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  Protein S activity was 38% (60-145%).  The following studies were normal:  prothrombin gene mutation, lupus anticoagulant panel, anticardiolipin antibodies, homocysteine, protein C antigen (88%), protein C activity (111%), protein S antigen total  (131%), and protein S antigen free (108%).    Additional testing on 11/27/2014 revealed resolution of anemia.  The following studies were normal:  SPEP, free light chain ratio, IgG, IgA, ferritin, reticulocyte count, B12, folate was 26.  Serum protein electrophoresis revealed no monoclonal protein.  Kappa free light chains were 26.67, lambda free light chains 24.67 and ratio 1.08 (normal).   IgM was 247 (26-247).  PNH testing was inadvertently not done.  24 hour UPEP is pending.  She was started on Xarelto on 11/08/2014.  She denies any prior history of thrombosis.  She denies any family history of thrombosis.  Chest x-ray on 11/05/2014 was negative.  Mammogram in 09/2014 was negative per patient report.  Colonoscopy on 06/07/2013 was negative.  She has not been on birth control pills.  Symptomatically, she feels really good. Weight is stable.  Exam is unremarkable.  Plan: 1.  Review hypercoagulable work-up to date.  Length of therapy at least 6 months.  Patients with heterozygosity for Factor V Leiden and a DVT of the lower extremity are typically not anticoagulated lifelong.  If a second clot develops off anticoagulation, anticoagulation is lifelong.  However, given the unusual location (portal vein), will discuss with the coagulation clinic at [facility].  This may be an indication for long term anticoagulation and definitively if another risk factor is identified.  Await pending labs and follow-up with [facility]. 2.  Labs today:  PNH by flow cytometry, protein S total and free, protein S activity level, ESR, ANA. 3.  Collect 24 hour urine for UPEP. 4.  Continue Xarelto. 5.  RTC in 1 month for MD assessment and review of additional testing.   Lequita Asal, MD  12/11/2014

## 2015-04-21 NOTE — Progress Notes (Signed)
[facility] day:  01/10/2015  Chief Complaint: Emily Pitts is a 60 y.o. female with portal vein thrombosis on Xarelto who is seen for review of additional testing.  HPI:   The patient was last seen in the hemaology clinic on 12/11/2014.  At that time, she felt good.  She denied any complaints.  She remained on Xarelto.  She denied any bruising or bleeding.  Her anemia had resolved.  She underwent further testing on 12/11/2014.  PNH by flow cytometry was negative. Protein S antigen total was 95% (58-150%), protein S antigen free 77% (56-124%).  Protein S activity was 69% (60-145%).  ANA was negative. Sedimentation rate was 27 (normal). 24 hour UPEP revealed no monoclonal protein.  There were 359.6 mg of protein excreted per 24 hours.   When recalling the events of her original clot, she states that she "felt so bad with drastic chills and sweats". She did not feel like eating or drinking for 5 days. She was voiding little.  Initial labs confirmed dehydration.  Symptomatically, she denies any symptoms. She notes that she gets a little swimmy headed when she gets up after sitting..  She has been on low-dose blood pressure medications for the past month.  She was on a whole pill, now she is on a half pill.  Weight is stable.   Past Medical History  Diagnosis Date  . Hypertension   . Hyperlipidemia   . Allergy   . Diabetes mellitus without complication ([place]) 2025  . Vitamin D deficiency   . Numbness of feet   . Low back pain, episodic     Past Surgical History  Procedure Laterality Date  . Appendectomy  1966  . Abdominal hysterectomy  2010  . Dilation and curettage of uterus    . Colonoscopy  06-07-13    Dr Bary Castilla    Family History  Problem Relation Age of Onset  . Diabetes Mother   . COPD Mother   . Diabetes Father   . Hypertension Father   . Cancer Father     Kidney and Prostate  . CAD Father   . Diabetes Brother     Oldest  Brother  Her grandfather had a CVA in his 62s.  A niece had an early pregnancy loss.   Social History:  reports that she has never smoked. She has never used smokeless tobacco. She reports that she does not drink alcohol or use illicit drugs.  She does not have any children.  The patient is alone today.  Allergies: No Known Allergies  Current Medications: Current Outpatient Prescriptions  Medication Sig Dispense Refill  . aspirin EC 81 MG tablet Take 81 mg by mouth daily.    . fluticasone (FLONASE) 50 MCG/ACT nasal spray Place 2 sprays into both nostrils as needed.    Marland Kitchen glucose blood (ONE TOUCH ULTRA TEST) test strip Use as instructed 100 each 12  . levocetirizine (XYZAL) 5 MG tablet TAKE ONE TABLET BY MOUTH ONCE DAILY 90 tablet 1  . magnesium oxide (MAG-OX) 400 MG tablet Take 400 mg by mouth daily.    . rivaroxaban (XARELTO) 20 MG TABS tablet Take 1 tablet (20 mg total) by mouth daily with supper. 30 tablet 5  . vitamin B-12 (CYANOCOBALAMIN) 1000 MCG tablet Take 1,000 mcg by mouth daily.    . vitamin C (ASCORBIC ACID) 500 MG tablet Take 500 mg by mouth daily.    Marland Kitchen atorvastatin (LIPITOR) 40 MG tablet  Take 1 tablet (40 mg total) by mouth daily. 90 tablet 1  . losartan (COZAAR) 100 MG tablet Take 1 tablet (100 mg total) by mouth daily. 90 tablet 1   No current facility-administered medications for this visit.    Review of Systems:  GENERAL:  Feels good.  No fevers or sweats.  Initial weight loss. Weight gain of 3 since last visit. PERFORMANCE STATUS (ECOG):  0 HEENT:  No visual changes, runny nose, sore throat, mouth sores or tenderness. Lungs: No shortness of breath or cough.  No hemoptysis. Cardiac:  No chest pain, palpitations, orthopnea, or PND.  Dizzy when getting up.  BP meds being adjusted. GI:  No nausea, vomiting, diarrhea, constipation, melena or hematochezia. Colonoscopy in 06/2013. GU:  No urgency, frequency, dysuria, or hematuria. Musculoskeletal:  No back pain.  No joint  pain.  No muscle tenderness. Extremities:  No pain or swelling. Skin:  No rashes or skin changes. Neuro:  No headache, numbness or weakness, balance or coordination issues. Endocrine:  Diabetes.  No thyroid issues, hot flashes or night sweats. Psych:  No mood changes, depression or anxiety. Pain:  No focal pain. Review of systems:  All other systems reviewed and found to be negative.  Physical Exam: Blood pressure 119/70, pulse 81, temperature 98.5 F (36.9 C), resp. rate 18, height 5' 9"  (1.753 m), weight 222 lb 3.6 oz (100.8 kg). GENERAL:  Well developed, well nourished, sitting comfortably in the exam room in no acute distress. MENTAL STATUS:  Alert and oriented to person, place and time. HEAD:  Short styled strawberry blonde hair.  Normocephalic, atraumatic, face symmetric, no Cushingoid features. EYES:  Blue eyes.  Pupils equal round and reactive to light and accomodation.  No conjunctivitis or scleral icterus. ENT:  Oropharynx clear without lesion.  Tongue normal. Mucous membranes moist.  RESPIRATORY:  Clear to auscultation without rales, wheezes or rhonchi. CARDIOVASCULAR:  Regular rate and rhythm without murmur, rub or gallop. ABDOMEN:  Soft, non-tender, with active bowel sounds, and no appreciablehepatosplenomegaly.  No masses. SKIN:  No rashes, ulcers or lesions. EXTREMITIES: No edema, no skin discoloration or tenderness.  No palpable cords. LYMPH NODES: No palpable cervical, supraclavicular, axillary or inguinal adenopathy  NEUROLOGICAL: Unremarkable. PSYCH:  Appropriate.  No visits with results within 3 Day(s) from this visit. Latest known visit with results is:  Appointment on 12/11/2014  Component Date Value Ref Range Status  . Anit Nuclear Antibody(ANA) 12/11/2014 Negative  Negative Final   Comment: (NOTE) Performed At: [facility] [place], [place] [id number] Lindon Romp MD [id number]   . Sed Rate 12/11/2014 27  0 - 30 mm/hr Final   . Protein S Ag, Total 12/11/2014 95  58 - 150 % Final   Comment: (NOTE) **Effective December 31, 2014 Protein S, Total**  reference interval will be changing to:                            0 - 3 days       27 -  55                       4 days - 6 months     44 - 111                               >6 months     60 -  150   . Protein S Ag, Free 12/11/2014 77  56 - 124 % Final   Comment: (NOTE) **Effective December 31, 2014 Protein S, Free reference**  interval will be changing to:                            0 - 3 days       29 -  54                       4 days - 6 months     38 - 115                               >6 months     57 - 157 Performed At: [facility] [place], [place] [id number] Lindon Romp MD [id number]   . Protein S Activity 12/11/2014 69  60 - 145 % Final   Comment: (NOTE) **Effective December 31, 2014 Protein S-Functional**  reference interval will be changing to:                            0 - 3 days       26 -  54                       4 days - 6 months     40 -  98                               >6 months     63 - 140 Performed At: [facility] [place], [place] [id number] Lindon Romp MD [id number]   . Interpretation 12/11/2014 Comment   Final   Comment: (NOTE) Peripheral Blood: No detected deficiency of the GPI-Anchor and GPI-Anchored markers tested/No immunophenotypic evidence of Paroxysmal Nocturnal Hemoglobinuria.   . Comment: 12/11/2014 Comment   Final   Comment: (NOTE) At the sensitivity levels of this assay (<0.1%), these results do not support a diagnosis of paroxysmal nocturnal hemoglobinuria (PNH). Correlation with available clinical, laboratory, and morphologic data is recommended.   Marland Kitchen Specimen: 12/11/2014 Peripheral Blood   Final  . Submitted Dx: 12/11/2014 Comment   Final   Evaluation for paroxysmal nocturnal hemoglobinuria (PNH)  . Viability: 12/11/2014 97%   Final   (7AAD  exclusion)  . Cell Population 12/11/2014 Comment   Final                            Population Analysis  . Granulocytes: 12/11/2014 Comment   Final   Comment: (NOTE) No detected deficiency of the GPI-Anchor or GPI-Anchored markers tested.   . Monocytes: 12/11/2014 Comment   Final   Comment: (NOTE) No detected deficiency of the GPI-Anchor or GPI-Anchored markers tested.   Marland Kitchen Antibodies Performed: 12/11/2014 Comment   Final   CD14, CD15, CD24, CD45, CD64, FLAER  . Comment: 12/11/2014 Comment   Final   Comment: (NOTE) This test was developed and its performance characteristics determined by [facility]. It has not been cleared or approved by the Food and Drug Administration (FDA). The FDA has determined that such clearance or approval is not necessary. Any image(s) that accompany this report is/are a representative  image(s) only and should not be used to render a diagnosis. Performed At: ;# [facility] [address] [place], [place] [id number] Ebony Hail MD [id number] Performed At: [facility] [address] [place], [place] [id number] Lindon Romp MD [id number]     Assessment:  ARALY KAAS is a 60 y.o. female with portal vein thrombosis presenting with fever, chills, dehydration, weight loss, and abdominal pain.  Symptoms lasted for about 4 weeks.  Abdominal and pelvic CT on 11/07/2014 revealed a partially thrombosed portal veins (left > right).  The main portal vein and mesenteric veins remained patent.  Porta hepatis nodes measured 1.2 cm in short axis (stable).  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  Protein S activity was 38% (60-145%).  The following studies were normal:  prothrombin gene mutation, lupus anticoagulant panel, anticardiolipin antibodies, homocysteine, protein C antigen (88%), protein C activity (111%), protein S antigen total (131%), and protein S antigen free  (108%).    Additional testing on 11/27/2014 revealed resolution of anemia.  The following studies were normal:  SPEP, free light chain ratio, IgG, IgA, ANA, ESR, ferritin, reticulocyte count, B12, and folate.  Serum protein electrophoresis revealed no monoclonal protein.  Kappa free light chains were 26.67, lambda free light chains 24.67 and ratio 1.08 (normal).   IgM was 247 (26-247).  PNH testing by flow cytometry was negative.  24 hour UPEP was negative.  On 12/11/2014, testing was normal for protein S antigen total (95%), protein S antigen free (77%), and protein S activity (69%).  She was started on Xarelto on 11/08/2014.  She denies any prior history of thrombosis.  She denies any family history of thrombosis.  Chest x-ray on 11/05/2014 was negative.  Mammogram in 09/2014 was negative per patient report.  Colonoscopy on 06/07/2013 was negative.  She has not been on birth control pills.  Symptomatically, she feels really good. Weight is stable.  Exam is unremarkable.  Plan: 1.  Review remaining work-up.  Discuss Factor V Leiden.  Patients with heterozygosity for Factor V Leiden and a DVT of the lower extremity are typically not anticoagulated lifelong.  If a second clot develops off anticoagulation, anticoagulation is lifelong.  However, given the unusual location (portal vein), will discuss with the coagulation clinic at [facility].  This may be an indication for long term anticoagulation. 2.  Schedule duplex portal vein 04/29/2015- assess portal vein thrombosis. 3.  Continue Xarelto. 4.  RTC on 05/02/2015 for MD assessment, labs (CBC with diff, CMP, D-dimer) and discussion regarding Xarelto.   Lequita Asal, MD  01/10/2015

## 2015-04-25 ENCOUNTER — Telehealth: Payer: Self-pay | Admitting: Family Medicine

## 2015-04-25 DIAGNOSIS — E1129 Type 2 diabetes mellitus with other diabetic kidney complication: Secondary | ICD-10-CM

## 2015-04-25 DIAGNOSIS — I10 Essential (primary) hypertension: Secondary | ICD-10-CM

## 2015-04-25 DIAGNOSIS — E559 Vitamin D deficiency, unspecified: Secondary | ICD-10-CM

## 2015-04-25 DIAGNOSIS — E785 Hyperlipidemia, unspecified: Secondary | ICD-10-CM

## 2015-04-25 DIAGNOSIS — Z79899 Other long term (current) drug therapy: Secondary | ICD-10-CM

## 2015-04-25 NOTE — Telephone Encounter (Signed)
IS COMING IN FOR A 56M FU ON tHURSDAY THE 23RD AND IS ASKING IF SHE NEEDS BLOOD WORK IF THE SLIP COULD BE UP FRONT FOR HER TO PICK UP. SHE ASKED [place] TO CALL HER AND LET HER KNOW.

## 2015-04-25 NOTE — Telephone Encounter (Signed)
Ordered labs, please print and notify patient when ready for her to pick it up. Thank you

## 2015-04-26 ENCOUNTER — Other Ambulatory Visit: Payer: Self-pay | Admitting: Hematology and Oncology

## 2015-04-26 DIAGNOSIS — I81 Portal vein thrombosis: Secondary | ICD-10-CM

## 2015-04-29 ENCOUNTER — Ambulatory Visit: Admission: RE | Admit: 2015-04-29 | Payer: BC Managed Care – PPO | Source: Ambulatory Visit

## 2015-05-01 ENCOUNTER — Other Ambulatory Visit: Payer: Self-pay

## 2015-05-01 DIAGNOSIS — I81 Portal vein thrombosis: Secondary | ICD-10-CM

## 2015-05-02 ENCOUNTER — Ambulatory Visit (INDEPENDENT_AMBULATORY_CARE_PROVIDER_SITE_OTHER): Payer: BC Managed Care – PPO | Admitting: Family Medicine

## 2015-05-02 ENCOUNTER — Inpatient Hospital Stay: Payer: BC Managed Care – PPO | Attending: Hematology and Oncology

## 2015-05-02 ENCOUNTER — Ambulatory Visit
Admission: RE | Admit: 2015-05-02 | Discharge: 2015-05-02 | Disposition: A | Discharge status: Home / Self Care | Payer: BC Managed Care – PPO | Source: Ambulatory Visit | Attending: Hematology and Oncology | Admitting: Hematology and Oncology

## 2015-05-02 ENCOUNTER — Encounter: Payer: Self-pay | Admitting: Family Medicine

## 2015-05-02 ENCOUNTER — Inpatient Hospital Stay (HOSPITAL_BASED_OUTPATIENT_CLINIC_OR_DEPARTMENT_OTHER): Payer: BC Managed Care – PPO | Admitting: Hematology and Oncology

## 2015-05-02 ENCOUNTER — Other Ambulatory Visit: Payer: Self-pay | Admitting: Family Medicine

## 2015-05-02 ENCOUNTER — Other Ambulatory Visit: Payer: Self-pay | Admitting: Hematology and Oncology

## 2015-05-02 ENCOUNTER — Encounter: Payer: Self-pay | Admitting: Hematology and Oncology

## 2015-05-02 VITALS — BP 132/60 | HR 101 | Temp 98.1°F | Resp 14 | Ht 69.0 in | Wt 232.0 lb

## 2015-05-02 VITALS — BP 145/70 | HR 78 | Temp 98.0°F | Resp 18 | Ht 69.0 in | Wt 232.4 lb

## 2015-05-02 DIAGNOSIS — J309 Allergic rhinitis, unspecified: Secondary | ICD-10-CM

## 2015-05-02 DIAGNOSIS — Z7901 Long term (current) use of anticoagulants: Secondary | ICD-10-CM | POA: Diagnosis not present

## 2015-05-02 DIAGNOSIS — K8020 Calculus of gallbladder without cholecystitis without obstruction: Secondary | ICD-10-CM | POA: Diagnosis not present

## 2015-05-02 DIAGNOSIS — J302 Other seasonal allergic rhinitis: Secondary | ICD-10-CM

## 2015-05-02 DIAGNOSIS — Z23 Encounter for immunization: Secondary | ICD-10-CM | POA: Diagnosis not present

## 2015-05-02 DIAGNOSIS — E785 Hyperlipidemia, unspecified: Secondary | ICD-10-CM | POA: Diagnosis not present

## 2015-05-02 DIAGNOSIS — E1129 Type 2 diabetes mellitus with other diabetic kidney complication: Secondary | ICD-10-CM | POA: Diagnosis not present

## 2015-05-02 DIAGNOSIS — D6851 Activated protein C resistance: Secondary | ICD-10-CM | POA: Insufficient documentation

## 2015-05-02 DIAGNOSIS — I10 Essential (primary) hypertension: Secondary | ICD-10-CM | POA: Diagnosis not present

## 2015-05-02 DIAGNOSIS — Z79899 Other long term (current) drug therapy: Secondary | ICD-10-CM | POA: Insufficient documentation

## 2015-05-02 DIAGNOSIS — Z7982 Long term (current) use of aspirin: Secondary | ICD-10-CM | POA: Diagnosis not present

## 2015-05-02 DIAGNOSIS — I81 Portal vein thrombosis: Secondary | ICD-10-CM | POA: Diagnosis not present

## 2015-05-02 DIAGNOSIS — K219 Gastro-esophageal reflux disease without esophagitis: Secondary | ICD-10-CM | POA: Diagnosis not present

## 2015-05-02 DIAGNOSIS — E119 Type 2 diabetes mellitus without complications: Secondary | ICD-10-CM | POA: Diagnosis not present

## 2015-05-02 DIAGNOSIS — J3089 Other allergic rhinitis: Secondary | ICD-10-CM

## 2015-05-02 LAB — COMPREHENSIVE METABOLIC PANEL
ALT: 28 U/L (ref 14–54)
AST: 20 U/L (ref 15–41)
Albumin: 3.8 g/dL (ref 3.5–5.0)
Alkaline Phosphatase: 109 U/L (ref 38–126)
Anion gap: 6 (ref 5–15)
BUN: 12 mg/dL (ref 6–20)
CO2: 25 mmol/L (ref 22–32)
Calcium: 8.9 mg/dL (ref 8.9–10.3)
Chloride: 107 mmol/L (ref 101–111)
Creatinine, Ser: 0.78 mg/dL (ref 0.44–1.00)
GFR calc Af Amer: >60 mL/min (ref >60)
GFR calc non Af Amer: >60 mL/min (ref >60)
Glucose, Bld: 161 mg/dL — ABNORMAL HIGH (ref 65–99)
Potassium: 3.5 mmol/L (ref 3.5–5.1)
Sodium: 138 mmol/L (ref 135–145)
Total Bilirubin: 0.8 mg/dL (ref 0.3–1.2)
Total Protein: 6.9 g/dL (ref 6.5–8.1)

## 2015-05-02 LAB — FIBRIN DERIVATIVES D-DIMER (ARMC ONLY): Fibrin derivatives D-dimer (ARMC): 451 (ref 0–499)

## 2015-05-02 LAB — CBC WITH DIFFERENTIAL/PLATELET
Basophils Absolute: 0 10*3/uL (ref 0–0.1)
Basophils Relative: 0 %
Eosinophils Absolute: 0.1 10*3/uL (ref 0–0.7)
Eosinophils Relative: 2 %
HCT: 38.8 % (ref 35.0–47.0)
Hemoglobin: 13 g/dL (ref 12.0–16.0)
Lymphocytes Relative: 26 %
Lymphs Abs: 2.2 10*3/uL (ref 1.0–3.6)
MCH: 27.8 pg (ref 26.0–34.0)
MCHC: 33.4 g/dL (ref 32.0–36.0)
MCV: 83.1 fL (ref 80.0–100.0)
Monocytes Absolute: 0.7 10*3/uL (ref 0.2–0.9)
Monocytes Relative: 8 %
Neutro Abs: 5.6 10*3/uL (ref 1.4–6.5)
Neutrophils Relative %: 64 %
Platelets: 158 10*3/uL (ref 150–440)
RBC: 4.67 MIL/uL (ref 3.80–5.20)
RDW: 16.4 % — ABNORMAL HIGH (ref 11.5–14.5)
WBC: 8.6 10*3/uL (ref 3.6–11.0)

## 2015-05-02 MED ORDER — RIVAROXABAN 20 MG PO TABS: 20.0000 mg | ORAL_TABLET | Freq: Every day | ORAL | Status: DC

## 2015-05-02 MED ORDER — LEVOCETIRIZINE DIHYDROCHLORIDE 5 MG PO TABS: 5.0000 mg | ORAL_TABLET | Freq: Every day | ORAL | Status: DC

## 2015-05-02 MED ORDER — ATORVASTATIN CALCIUM 40 MG PO TABS: 40.0000 mg | ORAL_TABLET | Freq: Every day | ORAL | Status: DC

## 2015-05-02 MED ORDER — LOSARTAN POTASSIUM 100 MG PO TABS: 100.0000 mg | ORAL_TABLET | Freq: Every day | ORAL | Status: DC

## 2015-05-02 MED ORDER — MONTELUKAST SODIUM 10 MG PO TABS: 10.0000 mg | ORAL_TABLET | Freq: Every day | ORAL | Status: DC

## 2015-05-02 NOTE — Progress Notes (Signed)
Name: Emily Pitts   MRN: [id number]    DOB: April 04, 1955   Date:05/02/2015       Progress Note  Subjective  Chief Complaint  Chief Complaint  Patient presents with  . Medication Refill    3 month F/U and had blood worked checked this am.  . Diabetes    Checks 2-3 times weekly, Sugared running good at home [place]  . Hypertension    Edema in bilateral ankles once sitting for extended amount of time  . Hyperlipidemia  . Gastroesophageal Reflux    Does not take medication, just doing dietary modification  . Allergic Rhinitis     Due to weather changes her allergies have been flaring up    HPI  Diabetes Type II with microalbuminuria: on diet only and hgbA1C is back to normal , she had labs done this am and results are pending, no blurred vision, no polyphagia, polyuria or polydipsia. Chronic nocturia once per night - she drinks a lot of water in the evenings. Complaint with diabetic diet. Glucose at home has been around 140's fasting, highest of 288 post-prandially. She is on Losartan 50 mg daily   HTN: she is on Losartan 50 mg and bp is at goal, denies side effects of medication. No chest pain, SOB or palpitation  Hyperlipidemia: she is now on Atorvastatin and denies side effects. Denies side effects  GERD: she stopped taking Omeprazole, controlled with life style modification, no heartburn or regurgitation  Vertigo: back in the Fall 2016 seen by ENT and symptoms resolved  Perennial AR with seasonal variation: symptoms of allergies are back with nasal congestion, sneezing, rhinorrhea, mild facial pressure and intermittent watery and itchy eyes,  no pruritus. She was advised continue nasal steroids, continue Xyzal  Portal Vein Thrombosis: seen hematologist, she will have repeat doppler in a few months, taking Xarelto, denies any bleeding. She is having further testing and may be able to come off Xarelto  Patient Active Problem List   Diagnosis Date Noted  .  Portal vein thrombosis 11/07/2014  . History of sepsis 10/10/2014  . Abnormal electrocardiogram 10/08/2014  . Nonspecific abnormal electromyogram (EMG) 10/08/2014  . Benign essential HTN 10/08/2014  . Diabetes mellitus with renal manifestation ([place]) 10/08/2014  . Dyslipidemia 10/08/2014  . LBP (low back pain) 10/08/2014  . Gastro-esophageal reflux disease without esophagitis 10/08/2014  . Microalbuminuria 10/08/2014  . Disturbance of skin sensation 10/08/2014  . Obesity (BMI 30-39.9) 10/08/2014  . Perennial allergic rhinitis with seasonal variation 10/08/2014  . Plantar fasciitis 10/08/2014  . Postablative ovarian failure 10/08/2014  . Menopausal symptom 10/08/2014  . Vitamin D deficiency 10/08/2014    Past Surgical History  Procedure Laterality Date  . Appendectomy  1966  . Abdominal hysterectomy  2010  . Dilation and curettage of uterus    . Colonoscopy  06-07-13    Dr Bary Castilla    Family History  Problem Relation Age of Onset  . Diabetes Mother   . COPD Mother   . Diabetes Father   . Hypertension Father   . Cancer Father     Kidney and Prostate  . CAD Father   . Diabetes Brother     Oldest Brother    Social History   Social History  . Marital Status: Single    Spouse Name: N/A  . Number of Children: N/A  . Years of Education: N/A   Occupational History  . Not on file.   Social History Main Topics  . Smoking  status: Never Smoker   . Smokeless tobacco: Never Used  . Alcohol Use: No  . Drug Use: No  . Sexual Activity: Not on file   Other Topics Concern  . Not on file   Social History Narrative     Current outpatient prescriptions:  .  aspirin EC 81 MG tablet, Take 81 mg by mouth daily., Disp: , Rfl:  .  atorvastatin (LIPITOR) 40 MG tablet, Take 1 tablet (40 mg total) by mouth daily., Disp: 90 tablet, Rfl: 1 .  cholecalciferol (VITAMIN D) 1000 units tablet, Take 1,000 Units by mouth daily., Disp: , Rfl:  .  fluticasone (FLONASE) 50 MCG/ACT nasal spray,  Place 2 sprays into both nostrils as needed., Disp: , Rfl:  .  glucose blood (ONE TOUCH ULTRA TEST) test strip, Use as instructed, Disp: 100 each, Rfl: 12 .  levocetirizine (XYZAL) 5 MG tablet, Take 1 tablet (5 mg total) by mouth daily., Disp: 90 tablet, Rfl: 1 .  losartan (COZAAR) 100 MG tablet, Take 1 tablet (100 mg total) by mouth daily., Disp: 90 tablet, Rfl: 1 .  magnesium oxide (MAG-OX) 400 MG tablet, Take 400 mg by mouth daily., Disp: , Rfl:  .  rivaroxaban (XARELTO) 20 MG TABS tablet, Take 1 tablet (20 mg total) by mouth daily with supper., Disp: 30 tablet, Rfl: 5 .  vitamin B-12 (CYANOCOBALAMIN) 1000 MCG tablet, Take 1,000 mcg by mouth daily., Disp: , Rfl:  .  vitamin C (ASCORBIC ACID) 500 MG tablet, Take 500 mg by mouth daily., Disp: , Rfl:  .  montelukast (SINGULAIR) 10 MG tablet, Take 1 tablet (10 mg total) by mouth at bedtime., Disp: 90 tablet, Rfl: 1  No Known Allergies   ROS  Constitutional: Negative for fever , positive for  weight change ( gained 9 lbs - she was sick in the Fall on 2016 and had lost a lot of weight).  Respiratory: Negative for cough and shortness of breath.   Cardiovascular: Negative for chest pain or palpitations.  Gastrointestinal: Negative for abdominal pain, no bowel changes.  Musculoskeletal: Negative for gait problem or joint swelling.  Skin: Negative for rash.  Neurological: Negative for dizziness or headache.  No other specific complaints in a complete review of systems (except as listed in HPI above).  Objective  Filed Vitals:   05/02/15 1321  BP: 132/60  Pulse: 101  Temp: 98.1 F (36.7 C)  TempSrc: Oral  Resp: 14  Height: 5\' 9"  (1.753 m)  Weight: 232 lb (105.235 kg)  SpO2: 98%    Body mass index is 34.24 kg/(m^2).  Physical Exam  Constitutional: Patient appears well-developed and well-nourished. Obese No distress.  HEENT: head atraumatic, normocephalic, pupils equal and reactive to light, neck supple, throat within normal  limits Cardiovascular: Normal rate, regular rhythm and normal heart sounds.  No murmur heard. No BLE edema. Pulmonary/Chest: Effort normal and breath sounds normal. No respiratory distress. Abdominal: Soft.  There is no tenderness. Psychiatric: Patient has a normal mood and affect. behavior is normal. Judgment and thought content normal.  PHQ2/9: Depression screen [facility] 2/9 05/02/2015 10/08/2014  Decreased Interest 0 0  Down, Depressed, Hopeless 0 0  PHQ - 2 Score 0 0    Fall Risk: Fall Risk  05/02/2015 10/08/2014  Falls in the past year? Yes No  Number falls in past yr: 1 -  Injury with Fall? No -    Functional Status Survey: Is the patient deaf or have difficulty hearing?: No Does the patient have difficulty  seeing, even when wearing glasses/contacts?: No Does the patient have difficulty concentrating, remembering, or making decisions?: No Does the patient have difficulty walking or climbing stairs?: No Does the patient have difficulty dressing or bathing?: No Does the patient have difficulty doing errands alone such as visiting a doctor's office or shopping?: No    Assessment & Plan  1. 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 Benign essential HTN  - losartan (COZAAR) 100 MG tablet; Take 1 tablet (100 mg total) by mouth daily.  Dispense: 90 tablet; Refill: 1  3. Dyslipidemia  - atorvastatin (LIPITOR) 40 MG tablet; Take 1 tablet (40 mg total) by mouth daily.  Dispense: 90 tablet; Refill: 1  4. Portal vein thrombosis  Continue follow up with Dr. Patsy [place]  5. Need for shingles vaccine  - Varicella-zoster vaccine subcutaneous  6. Gastro-esophageal reflux disease without esophagitis  Continue life style modification   7. Perennial allergic rhinitis with seasonal variation  We will add Singulair, may also take Loratadine  otc in am's and continue  Xyzal at night - levocetirizine (XYZAL) 5 MG tablet; Take 1 tablet (5 mg total) by mouth daily.  Dispense: 90 tablet; Refill: 1 - montelukast (SINGULAIR) 10 MG tablet; Take 1 tablet (10 mg total) by mouth at bedtime.  Dispense: 90 tablet; Refill: 1

## 2015-05-02 NOTE — Progress Notes (Signed)
[facility] day:  05/02/2015   Chief Complaint: Emily Pitts is a 60 y.o. female with portal vein thrombosis who is seen for reassessment on Xarelto.  HPI:   The patient was last seen in the hemaology clinic on 01/10/2015.  At that time, she felt good. Weight was stable.  Exam was unremarkable.  Hypercoagulable work-up was only notable for heterozygosity for Factor V Leiden.  Negative studies included prothrombin gene mutation, lupus anticoagulant panel, anticardiolipin antibodies, homocysteine, protein C, protein S, anti-thrombin III, and PNH by flow cytometry.  CXR, mammogram, and colonoscopy were negative.    Abdominal and pelvic CT scan on 11/07/2014 revealed heterogeneous enhancement of the liver with thrombosis of the left portal vein and branches of the right portal vein.  The superior branch of the right portal vein may have also been involved given the patchy heterogeneous hypoenhancement of a 4-5 cm area of the posterior right dome.  There were 1.2 cm porta hepatitis nodes (stable).  Ultrasound hepatic doppler on 05/02/2015 revealed no convincing evidence of occlusion or thrombus.  There was layering sludge or non shadowing calculi in the dependent aspect of the gallbladder without ultrasound evidence of cholecystitis  She is scheduled for a follow-up abdominal and pelvic CT scan on 05/14/2015.  Symptomatically, she feels good.  She is on Xarelto.  She denied any bruising or bleeding.   Past Medical History  Diagnosis Date  . Hypertension   . Hyperlipidemia   . Allergy   . Diabetes mellitus without complication ([place]) 0300  . Vitamin D deficiency   . Numbness of feet   . Low back pain, episodic     Past Surgical History  Procedure Laterality Date  . Appendectomy  1966  . Abdominal hysterectomy  2010  . Dilation and curettage of uterus    . Colonoscopy  06-07-13    Dr Bary Castilla    Family History  Problem Relation Age of Onset  .  Diabetes Mother   . COPD Mother   . Diabetes Father   . Hypertension Father   . Cancer Father     Kidney and Prostate  . CAD Father   . Diabetes Brother     Oldest Brother  Her grandfather had a CVA in his 29s.  A niece had an early pregnancy loss.   Social History:  reports that she has never smoked. She has never used smokeless tobacco. She reports that she does not drink alcohol or use illicit drugs.  She does not have any children.  The patient is alone today.  Allergies: No Known Allergies  Current Medications: Current Outpatient Prescriptions  Medication Sig Dispense Refill  . aspirin EC 81 MG tablet Take 81 mg by mouth daily.    Marland Kitchen atorvastatin (LIPITOR) 40 MG tablet Take 1 tablet (40 mg total) by mouth daily. 90 tablet 1  . cholecalciferol (VITAMIN D) 1000 units tablet Take 1,000 Units by mouth daily.    . fluticasone (FLONASE) 50 MCG/ACT nasal spray Place 2 sprays into both nostrils as needed.    Marland Kitchen glucose blood (ONE TOUCH ULTRA TEST) test strip Use as instructed 100 each 12  . levocetirizine (XYZAL) 5 MG tablet Take 1 tablet (5 mg total) by mouth daily. 90 tablet 1  . losartan (COZAAR) 100 MG tablet Take 1 tablet (100 mg total) by mouth daily. 90 tablet 1  . magnesium oxide (MAG-OX) 400 MG tablet Take 400 mg by mouth daily.    Marland Kitchen  montelukast (SINGULAIR) 10 MG tablet Take 1 tablet (10 mg total) by mouth at bedtime. 90 tablet 1  . rivaroxaban (XARELTO) 20 MG TABS tablet Take 1 tablet (20 mg total) by mouth daily with supper. 30 tablet 5  . vitamin B-12 (CYANOCOBALAMIN) 1000 MCG tablet Take 1,000 mcg by mouth daily.    . vitamin C (ASCORBIC ACID) 500 MG tablet Take 500 mg by mouth daily.     No current facility-administered medications for this visit.    Review of Systems:  GENERAL:  Feels good.  No fevers or sweats.  Initial weight loss. Weight up 10 pounds since 01/10/2015. PERFORMANCE STATUS (ECOG):  0 HEENT:  No visual changes, runny nose, sore throat, mouth sores or  tenderness. Lungs: No shortness of breath or cough.  No hemoptysis. Cardiac:  No chest pain, palpitations, orthopnea, or PND.  Dizzy when getting up.  BP meds being adjusted. GI:  No nausea, vomiting, diarrhea, constipation, melena or hematochezia.  Colonoscopy in 06/2013. GU:  No urgency, frequency, dysuria, or hematuria. Musculoskeletal:  No back pain.  No joint pain.  No muscle tenderness. Extremities:  No pain or swelling. Skin:  No rashes or skin changes. Neuro:  No headache, numbness or weakness, balance or coordination issues. Endocrine:  Diabetes.  No thyroid issues, hot flashes or night sweats. Psych:  No mood changes, depression or anxiety. Pain:  No focal pain. Review of systems:  All other systems reviewed and found to be negative.  Physical Exam: Blood pressure 145/70, pulse 78, temperature 98 F (36.7 C), resp. rate 18, height 5' 9"  (1.753 m), weight 232 lb 5.8 oz (105.4 kg). GENERAL:  Well developed, well nourished, sitting comfortably in the exam room in no acute distress. MENTAL STATUS:  Alert and oriented to person, place and time. HEAD:  Short curly strawberry blonde hair.  Normocephalic, atraumatic, face symmetric, no Cushingoid features. EYES:  Blue eyes.  Pupils equal round and reactive to light and accomodation.  No conjunctivitis or scleral icterus. ENT:  Oropharynx clear without lesion.  Tongue normal. Mucous membranes moist.  RESPIRATORY:  Clear to auscultation without rales, wheezes or rhonchi. CARDIOVASCULAR:  Regular rate and rhythm without murmur, rub or gallop. ABDOMEN:  Soft, non-tender, with active bowel sounds, and no appreciablehepatosplenomegaly.  No masses. SKIN:  No rashes, ulcers or lesions. EXTREMITIES: No edema, no skin discoloration or tenderness.  No palpable cords. LYMPH NODES: No palpable cervical, supraclavicular, axillary or inguinal adenopathy  NEUROLOGICAL: Unremarkable. PSYCH:  Appropriate.  Appointment on 05/02/2015  Component Date  Value Ref Range Status  . WBC 05/02/2015 8.6  3.6 - 11.0 K/uL Final  . RBC 05/02/2015 4.67  3.80 - 5.20 MIL/uL Final  . Hemoglobin 05/02/2015 13.0  12.0 - 16.0 g/dL Final  . HCT 05/02/2015 38.8  35.0 - 47.0 % Final  . MCV 05/02/2015 83.1  80.0 - 100.0 fL Final  . MCH 05/02/2015 27.8  26.0 - 34.0 pg Final  . MCHC 05/02/2015 33.4  32.0 - 36.0 g/dL Final  . RDW 05/02/2015 16.4* 11.5 - 14.5 % Final  . Platelets 05/02/2015 158  150 - 440 K/uL Final  . Neutrophils Relative % 05/02/2015 64   Final  . Neutro Abs 05/02/2015 5.6  1.4 - 6.5 K/uL Final  . Lymphocytes Relative 05/02/2015 26   Final  . Lymphs Abs 05/02/2015 2.2  1.0 - 3.6 K/uL Final  . Monocytes Relative 05/02/2015 8   Final  . Monocytes Absolute 05/02/2015 0.7  0.2 - 0.9 K/uL Final  .  Eosinophils Relative 05/02/2015 2   Final  . Eosinophils Absolute 05/02/2015 0.1  0 - 0.7 K/uL Final  . Basophils Relative 05/02/2015 0   Final  . Basophils Absolute 05/02/2015 0.0  0 - 0.1 K/uL Final  . Sodium 05/02/2015 138  135 - 145 mmol/L Final  . Potassium 05/02/2015 3.5  3.5 - 5.1 mmol/L Final  . Chloride 05/02/2015 107  101 - 111 mmol/L Final  . CO2 05/02/2015 25  22 - 32 mmol/L Final  . Glucose, Bld 05/02/2015 161* 65 - 99 mg/dL Final  . BUN 05/02/2015 12  6 - 20 mg/dL Final  . Creatinine, Ser 05/02/2015 0.78  0.44 - 1.00 mg/dL Final  . Calcium 05/02/2015 8.9  8.9 - 10.3 mg/dL Final  . Total Protein 05/02/2015 6.9  6.5 - 8.1 g/dL Final  . Albumin 05/02/2015 3.8  3.5 - 5.0 g/dL Final  . AST 05/02/2015 20  15 - 41 U/L Final  . ALT 05/02/2015 28  14 - 54 U/L Final  . Alkaline Phosphatase 05/02/2015 109  38 - 126 U/L Final  . Total Bilirubin 05/02/2015 0.8  0.3 - 1.2 mg/dL Final  . GFR calc non Af Amer 05/02/2015 >60  >60 mL/min Final  . GFR calc Af Amer 05/02/2015 >60  >60 mL/min Final   Comment: (NOTE) The eGFR has been calculated using the CKD EPI equation. This calculation has not been validated in all clinical situations. eGFR's  persistently <60 mL/min signify possible Chronic Kidney Disease.   . Anion gap 05/02/2015 6  5 - 15 Final  Office Visit on 05/02/2015  Component Date Value Ref Range Status  . HM Mammogram 09/20/2014 Normal, [facility]   Final    Assessment:  LEZETTE KITTS is a 60 y.o. female with portal vein thrombosis presenting with fever, chills, dehydration, weight loss, and abdominal pain.  Symptoms lasted for about 4 weeks.  Abdominal and pelvic CT on 11/07/2014 revealed a partially thrombosed portal veins (left > right).  The main portal vein and mesenteric veins remained patent.  Porta hepatis nodes measured 1.2 cm in short axis (stable).  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  The following studies were normal:  prothrombin gene mutation, lupus anticoagulant panel, anticardiolipin antibodies, homocysteine, protein C antigen (88%), protein C activity (111%), protein S antigen total (131%), protein S antigen free (108%), anti-thrombin III antigen (88%), anti-thrombin III functional (101%).  On 12/11/2014, protein S antigen total (95%), protein S antigen free (77%), and protein S activity (69%) were normal.  PNH by flow cytometry was negative.  Additional testing on 11/27/2014 revealed resolution of anemia.  The following studies were normal:  SPEP, free light chain ratio, 24 hour UPEP, IgG, IgA, ANA, ESR, ferritin, reticulocyte count, B12, and folate.  Serum protein electrophoresis revealed no monoclonal protein.  Kappa free light chains were 26.67, lambda free light chains 24.67 and ratio 1.08 (normal).   IgM was 247 (26-217).  She was started on Xarelto on 11/08/2014.  Ultrasound hepatic doppler on 05/02/2015 revealed no convincing evidence of occlusion or thrombus.  She denies any prior history of thrombosis.  She has not been on birth control pills.  She denies any family history of thrombosis.   Screening studies for malignancy were normal.   Chest x-ray on 11/05/2014 was negative.  Mammogram in 09/2014 was negative per patient report.  Colonoscopy on 06/07/2013 was negative.    Symptomatically, she feels good. Weight has increased.  Exam is  unremarkable.  Plan: 1.  Labs today:  CBC with diff, CMP, D-dimer, beta2-glycoprotein, and JAK2 (per [facility] 2.  Review hepatic doppler study (no evidence of clot).  Follow-up abdominal/pelvic CT scan (ordered by Dr. Bary Castilla). 3.  Discuss lab studies ordered today.  Suspect will be normal.  Call patient with results. 4.  Discuss concern for an unprovoked thrombus in splanchnic vein for which [facility] would recommend long term anticoagulation if patient tolerating anticoagulation well.  Review of initial events suggests initial symptoms were likely related to thrombosis (review with Dr. Bary Castilla). 5.  Contact patient once labs and CT scan available. 6.  Continue Xarelto 20 mg a day.  Refill. 7.  RTC in 4 months for MD assessment and labs (CBC with diff, CMP).   Lequita Asal, MD  05/02/2015, 4:12 PM

## 2015-05-03 ENCOUNTER — Other Ambulatory Visit: Payer: Self-pay

## 2015-05-03 DIAGNOSIS — I81 Portal vein thrombosis: Secondary | ICD-10-CM

## 2015-05-03 LAB — LIPID PANEL
Chol/HDL Ratio: 1.8 ratio units (ref 0.0–4.4)
Cholesterol, Total: 124 mg/dL (ref 100–199)
HDL: 68 mg/dL (ref >39)
LDL CALC: 39 mg/dL (ref 0–99)
Triglycerides: 85 mg/dL (ref 0–149)
VLDL Cholesterol Cal: 17 mg/dL (ref 5–40)

## 2015-05-03 LAB — COMPREHENSIVE METABOLIC PANEL
A/G RATIO: 1.4 (ref 1.1–2.5)
ALBUMIN: 4.1 g/dL (ref 3.6–4.8)
ALT: 31 IU/L (ref 0–32)
AST: 22 IU/L (ref 0–40)
Alkaline Phosphatase: 132 IU/L — ABNORMAL HIGH (ref 39–117)
BILIRUBIN TOTAL: 0.7 mg/dL (ref 0.0–1.2)
BUN / CREAT RATIO: 12 (ref 11–26)
BUN: 10 mg/dL (ref 8–27)
CHLORIDE: 107 mmol/L — AB (ref 96–106)
CO2: 27 mmol/L (ref 18–29)
Calcium: 9.9 mg/dL (ref 8.7–10.3)
Creatinine, Ser: 0.81 mg/dL (ref 0.57–1.00)
GFR calc non Af Amer: 79 mL/min/{1.73_m2} (ref >59)
GFR, EST AFRICAN AMERICAN: 91 mL/min/{1.73_m2} (ref >59)
GLOBULIN, TOTAL: 3 g/dL (ref 1.5–4.5)
Glucose: 115 mg/dL — ABNORMAL HIGH (ref 65–99)
POTASSIUM: 5.2 mmol/L (ref 3.5–5.2)
SODIUM: 148 mmol/L — AB (ref 134–144)
TOTAL PROTEIN: 7.1 g/dL (ref 6.0–8.5)

## 2015-05-03 LAB — CBC WITH DIFFERENTIAL/PLATELET
BASOS: 0 %
Basophils Absolute: 0 10*3/uL (ref 0.0–0.2)
EOS (ABSOLUTE): 0.1 10*3/uL (ref 0.0–0.4)
Eos: 2 %
Hematocrit: 40.5 % (ref 34.0–46.6)
Hemoglobin: 13.9 g/dL (ref 11.1–15.9)
IMMATURE GRANS (ABS): 0 10*3/uL (ref 0.0–0.1)
Immature Granulocytes: 0 %
LYMPHS: 31 %
Lymphocytes Absolute: 2.2 10*3/uL (ref 0.7–3.1)
MCH: 28.4 pg (ref 26.6–33.0)
MCHC: 34.3 g/dL (ref 31.5–35.7)
MCV: 83 fL (ref 79–97)
Monocytes Absolute: 0.5 10*3/uL (ref 0.1–0.9)
Monocytes: 6 %
NEUTROS ABS: 4.2 10*3/uL (ref 1.4–7.0)
Neutrophils: 61 %
PLATELETS: 196 10*3/uL (ref 150–379)
RBC: 4.9 x10E6/uL (ref 3.77–5.28)
RDW: 16.2 % — ABNORMAL HIGH (ref 12.3–15.4)
WBC: 7 10*3/uL (ref 3.4–10.8)

## 2015-05-03 LAB — HEMOGLOBIN A1C
ESTIMATED AVERAGE GLUCOSE: 134 mg/dL
HEMOGLOBIN A1C: 6.3 % — AB (ref 4.8–5.6)

## 2015-05-03 LAB — VITAMIN D 25 HYDROXY (VIT D DEFICIENCY, FRACTURES): VIT D 25 HYDROXY: 37.4 ng/mL (ref 30.0–100.0)

## 2015-05-14 ENCOUNTER — Ambulatory Visit
Admission: RE | Admit: 2015-05-14 | Discharge: 2015-05-14 | Disposition: A | Discharge status: Home / Self Care | Payer: BC Managed Care – PPO | Source: Ambulatory Visit | Attending: General Surgery | Admitting: General Surgery

## 2015-05-14 ENCOUNTER — Telehealth: Payer: Self-pay | Admitting: General Surgery

## 2015-05-14 DIAGNOSIS — D1779 Benign lipomatous neoplasm of other sites: Secondary | ICD-10-CM | POA: Insufficient documentation

## 2015-05-14 DIAGNOSIS — I81 Portal vein thrombosis: Secondary | ICD-10-CM | POA: Insufficient documentation

## 2015-05-14 DIAGNOSIS — K5730 Diverticulosis of large intestine without perforation or abscess without bleeding: Secondary | ICD-10-CM | POA: Diagnosis not present

## 2015-05-14 DIAGNOSIS — R161 Splenomegaly, not elsewhere classified: Secondary | ICD-10-CM | POA: Diagnosis not present

## 2015-05-14 MED ORDER — IOHEXOL 350 MG/ML SOLN
125.0000 mL | Freq: Once | INTRAVENOUS | Status: AC | PRN
  Administered 2015-05-14: 125 mL via INTRAVENOUS

## 2015-05-14 NOTE — Telephone Encounter (Signed)
The patient was notified that her recent CT showed ongoing occlusion of the portal vein but recannulation of the other branches. No new areas of occlusion.  Stable borderline enlarged lymph node in the chest, unchanged to centimeter. Intra-abdominal lymph nodes smaller.  The patient has had no further abdominal pain (which prompted her initial CT when gallstones were thought to represent the source of her symptoms) sees.  Patient is being followed by Nolon Stalls, M.D. in medical hematology. Last visit of February 2017 reviewed there was consideration for discontinuation of her Xarelto therapy. Final decision will be deferred to Dr. Mike Gip. I suspect that the left portal vein occlusion will be chronic and not improved by further anticoagulation.  I told the patient that she should expect to hear from Dr. Mike Gip within a week after her review of the recent CT and her discussion with the [facility] coagulation clinic.  The patient will follow-up here on an as-needed basis.

## 2015-05-16 ENCOUNTER — Other Ambulatory Visit: Payer: Self-pay | Admitting: *Deleted

## 2015-05-16 NOTE — Telephone Encounter (Signed)
Notified Rx sent 2/23

## 2015-05-17 LAB — BETA-2-GLYCOPROTEIN I ABS, IGG/M/A
Beta-2 Glyco I IgG: <9 GPI IgG units (ref 0–20)
Beta-2-Glycoprotein I IgA: <9 GPI IgA units (ref 0–25)
Beta-2-Glycoprotein I IgM: <9 GPI IgM units (ref 0–32)

## 2015-05-17 LAB — JAK2  V617F QUAL. WITH REFLEX TO EXON 12

## 2015-05-17 LAB — JAK2 EXON 12 MUTATION ANALYSIS

## 2015-05-21 ENCOUNTER — Other Ambulatory Visit: Payer: Self-pay | Admitting: Hematology and Oncology

## 2015-05-21 ENCOUNTER — Ambulatory Visit: Payer: BC Managed Care – PPO | Admitting: General Surgery

## 2015-05-22 ENCOUNTER — Telehealth: Payer: Self-pay | Admitting: Hematology and Oncology

## 2015-05-22 NOTE — Telephone Encounter (Signed)
Re:  Anticoagulation  Discussed recommendations of continued anticoagulation per [facility] coagulation clinic as long as she is tolerating it well.  I discussed setting her up in their clinic if she would like to discuss further.  She is considering and will follow-up with [place] if she desires to talk with them.  Lequita Asal, MD

## 2015-05-30 ENCOUNTER — Encounter: Payer: Self-pay | Admitting: General Surgery

## 2015-05-30 ENCOUNTER — Ambulatory Visit (INDEPENDENT_AMBULATORY_CARE_PROVIDER_SITE_OTHER): Payer: BC Managed Care – PPO | Admitting: General Surgery

## 2015-05-30 VITALS — BP 134/70 | HR 76 | Resp 14 | Ht 69.0 in | Wt 228.0 lb

## 2015-05-30 DIAGNOSIS — I81 Portal vein thrombosis: Secondary | ICD-10-CM

## 2015-05-30 NOTE — Progress Notes (Signed)
Patient ID: Emily Pitts, female   DOB: 1955/12/08, 60 y.o.   MRN: [id number]  Chief Complaint  Patient presents with  . Follow-up    ct scan     HPI Emily Pitts is a 60 y.o. female here today to follow up from ct scan abdomen done on 05/14/15.The patient has been asymptomatic since initiation of anticoagulation therapy in fall 2016 1 portal vein thrombosis was identified. She has no dietary intolerance and has had no GI symptoms. HPI  Past Medical History  Diagnosis Date  . Hypertension   . Hyperlipidemia   . Allergy   . Diabetes mellitus without complication ([place]) 0000000  . Vitamin D deficiency   . Numbness of feet   . Low back pain, episodic     Past Surgical History  Procedure Laterality Date  . Appendectomy  1966  . Abdominal hysterectomy  2010  . Dilation and curettage of uterus    . Colonoscopy  06-07-13    Dr Bary Castilla    Family History  Problem Relation Age of Onset  . Diabetes Mother   . COPD Mother   . Diabetes Father   . Hypertension Father   . Cancer Father     Kidney and Prostate  . CAD Father   . Diabetes Brother     Oldest Brother    Social History Social History  Substance Use Topics  . Smoking status: Never Smoker   . Smokeless tobacco: Never Used  . Alcohol Use: No    No Known Allergies  Current Outpatient Prescriptions  Medication Sig Dispense Refill  . aspirin EC 81 MG tablet Take 81 mg by mouth daily.    Marland Kitchen atorvastatin (LIPITOR) 40 MG tablet Take 1 tablet (40 mg total) by mouth daily. 90 tablet 1  . cholecalciferol (VITAMIN D) 1000 units tablet Take 1,000 Units by mouth daily.    . fluticasone (FLONASE) 50 MCG/ACT nasal spray Place 2 sprays into both nostrils as needed.    Marland Kitchen glucose blood (ONE TOUCH ULTRA TEST) test strip Use as instructed 100 each 12  . levocetirizine (XYZAL) 5 MG tablet Take 1 tablet (5 mg total) by mouth daily. 90 tablet 1  . losartan (COZAAR) 100 MG tablet Take 1 tablet (100 mg total) by mouth daily. 90 tablet 1  .  magnesium oxide (MAG-OX) 400 MG tablet Take 400 mg by mouth daily.    . montelukast (SINGULAIR) 10 MG tablet Take 1 tablet (10 mg total) by mouth at bedtime. 90 tablet 1  . rivaroxaban (XARELTO) 20 MG TABS tablet Take 1 tablet (20 mg total) by mouth daily with supper. 30 tablet 1  . vitamin B-12 (CYANOCOBALAMIN) 1000 MCG tablet Take 1,000 mcg by mouth daily.    . vitamin C (ASCORBIC ACID) 500 MG tablet Take 500 mg by mouth daily.     No current facility-administered medications for this visit.    Review of Systems Review of Systems  Constitutional: Negative.   Respiratory: Negative.   Cardiovascular: Negative.     Blood pressure 134/70, pulse 76, resp. rate 14, height 5\' 9"  (1.753 m), weight 228 lb (103.42 kg).  Physical Exam Physical Exam  Constitutional: She is oriented to person, place, and time. She appears well-developed and well-nourished.  Neurological: She is alert and oriented to person, place, and time.  Skin: Skin is warm and dry.    Data Reviewed Abdominal ultrasound dated 05/02/2015:  IMPRESSION: 1. Unremarkable hepatic vascular Doppler evaluation. No evidence of residual left portal  vein thrombus. 2. Layering sludge or non shadowing calculi in the dependent aspect of the gallbladder without ultrasound evidence of cholecystitis.  05/14/2015 CT angio: IMPRESSION: 1. Chronic occlusion of the left portal vein. Interval reconstitution of previously noted thrombosis within the peripheral branches of the right portal vein. The right and main portal veins now appear widely patent. 2. The etiology of this lobar portal vein thrombosis is not depicted on this examination, specifically, a discrete hepatic lesion/mass is not identified and thus, thrombosis suspected to be secondary to hypercoagulable state (patient with history of factor 5 Leiden mutation). No morphologic evidence of cirrhosis. Unchanged borderline splenomegaly without evidence of gastroesophageal  varices or ascites to suggest significant portal venous hypertension. 3. Unchanged punctate (approximately 1.8 cm) lipoma within the distal sigmoid colon. 4. Similar findings of extensive colonic diverticulosis without evidence of diverticulitis but with apparent wall thickening involving the sigmoid colon. If not recently performed, further evaluation with colonoscopy to evaluate the presence of an underlying colonic malignancy is recommended.  Assessment    Asymptomatic biliary sludge/gallstones.  Chronic occlusion of left portal vein, asymptomatic.  Good tolerance of anticoagulation.  Colonoscopy April 2015 notable for diverticulosis. No indication for repeat study.    Plan    The patient will continue follow-up with Dr. Mike Gip in regards to her factor S low levels of activity (improved on anticoagulation). She has been offered formal University evaluation, and if desired she will make arrangements for this through Dr. Kem Parkinson office.    Patient to return as needed. PCP:  [place] This information has been scribed by Gaspar Cola CMA.   Robert Bellow 05/31/2015, 8:05 AM

## 2015-07-12 ENCOUNTER — Other Ambulatory Visit: Payer: Self-pay | Admitting: Hematology and Oncology

## 2015-08-15 ENCOUNTER — Encounter: Payer: Self-pay | Admitting: Family Medicine

## 2015-08-15 ENCOUNTER — Ambulatory Visit (INDEPENDENT_AMBULATORY_CARE_PROVIDER_SITE_OTHER): Payer: BC Managed Care – PPO | Admitting: Family Medicine

## 2015-08-15 VITALS — BP 138/78 | HR 86 | Temp 98.2°F | Resp 18 | Ht 69.0 in | Wt 227.2 lb

## 2015-08-15 DIAGNOSIS — J40 Bronchitis, not specified as acute or chronic: Secondary | ICD-10-CM

## 2015-08-15 DIAGNOSIS — R059 Cough, unspecified: Secondary | ICD-10-CM

## 2015-08-15 DIAGNOSIS — J069 Acute upper respiratory infection, unspecified: Secondary | ICD-10-CM

## 2015-08-15 DIAGNOSIS — R05 Cough: Secondary | ICD-10-CM | POA: Diagnosis not present

## 2015-08-15 MED ORDER — PREDNISONE 20 MG PO TABS: 20.0000 mg | ORAL_TABLET | Freq: Every day | ORAL | Status: DC

## 2015-08-15 MED ORDER — AZITHROMYCIN 250 MG PO TABS: ORAL_TABLET | ORAL | Status: DC

## 2015-08-15 MED ORDER — HYDROCOD POLST-CPM POLST ER 10-8 MG/5ML PO SUER: 5.0000 mL | Freq: Two times a day (BID) | ORAL | Status: DC | PRN

## 2015-08-15 NOTE — Progress Notes (Signed)
Name: Emily Pitts   MRN: [id number]    DOB: March 27, 1955   Date:08/15/2015       Progress Note  Subjective  Chief Complaint  Chief Complaint  Patient presents with  . Cough    Onset-First week in May it initially started and has progressively worsen, pateint is having a dry cough, fatigue, chest tightness from excessive coughing, ear pressure along with sinus drainage. Patient has been taking over the counter cough medication-Mucinex DM with some relief    HPI  Bronchitis: she has been sick for one month, initially only rhinorrhea, congestion ( cold symptoms) she felt better but continue to cough and over the past 5 days symptoms have been worse , with dry coughing spells and some chest congestion, she also has bilateral ear fullness and post-nasal drainage.  Feeling tired, no energy, slightly decrease in appetite, she has some headache with coughing spells She denies fever, change in bowel movements or rash. Taking otc medication with some relief.    Patient Active Problem List   Diagnosis Date Noted  . Factor 5 Leiden mutation, heterozygous ([place]) 05/02/2015  . Portal vein thrombosis 11/07/2014  . History of sepsis 10/10/2014  . Abnormal electrocardiogram 10/08/2014  . Nonspecific abnormal electromyogram (EMG) 10/08/2014  . Benign essential HTN 10/08/2014  . Diabetes mellitus with renal manifestation ([place]) 10/08/2014  . Dyslipidemia 10/08/2014  . LBP (low back pain) 10/08/2014  . Gastro-esophageal reflux disease without esophagitis 10/08/2014  . Microalbuminuria 10/08/2014  . Disturbance of skin sensation 10/08/2014  . Obesity (BMI 30-39.9) 10/08/2014  . Perennial allergic rhinitis with seasonal variation 10/08/2014  . Plantar fasciitis 10/08/2014  . Postablative ovarian failure 10/08/2014  . Menopausal symptom 10/08/2014  . Vitamin D deficiency 10/08/2014    Past Surgical History  Procedure Laterality Date  . Appendectomy  1966  . Abdominal hysterectomy  2010  . Dilation and  curettage of uterus    . Colonoscopy  06-07-13    Dr Bary Castilla    Family History  Problem Relation Age of Onset  . Diabetes Mother   . COPD Mother   . Diabetes Father   . Hypertension Father   . Cancer Father     Kidney and Prostate  . CAD Father   . Diabetes Brother     Oldest Brother    Social History   Social History  . Marital Status: Single    Spouse Name: N/A  . Number of Children: N/A  . Years of Education: N/A   Occupational History  . Not on file.   Social History Main Topics  . Smoking status: Never Smoker   . Smokeless tobacco: Never Used  . Alcohol Use: No  . Drug Use: No  . Sexual Activity: Not on file   Other Topics Concern  . Not on file   Social History Narrative     Current outpatient prescriptions:  .  aspirin EC 81 MG tablet, Take 81 mg by mouth daily., Disp: , Rfl:  .  atorvastatin (LIPITOR) 40 MG tablet, Take 1 tablet (40 mg total) by mouth daily., Disp: 90 tablet, Rfl: 1 .  cholecalciferol (VITAMIN D) 1000 units tablet, Take 1,000 Units by mouth daily., Disp: , Rfl:  .  fluticasone (FLONASE) 50 MCG/ACT nasal spray, Place 2 sprays into both nostrils as needed., Disp: , Rfl:  .  glucose blood (ONE TOUCH ULTRA TEST) test strip, Use as instructed, Disp: 100 each, Rfl: 12 .  levocetirizine (XYZAL) 5 MG tablet, Take 1 tablet (5  mg total) by mouth daily., Disp: 90 tablet, Rfl: 1 .  losartan (COZAAR) 100 MG tablet, Take 1 tablet (100 mg total) by mouth daily., Disp: 90 tablet, Rfl: 1 .  magnesium oxide (MAG-OX) 400 MG tablet, Take 400 mg by mouth daily., Disp: , Rfl:  .  montelukast (SINGULAIR) 10 MG tablet, Take 1 tablet (10 mg total) by mouth at bedtime., Disp: 90 tablet, Rfl: 1 .  vitamin B-12 (CYANOCOBALAMIN) 1000 MCG tablet, Take 1,000 mcg by mouth daily., Disp: , Rfl:  .  vitamin C (ASCORBIC ACID) 500 MG tablet, Take 500 mg by mouth daily., Disp: , Rfl:  .  XARELTO 20 MG TABS tablet, TAKE ONE TABLET BY MOUTH ONCE DAILY WITH SUPPER, Disp: 30  tablet, Rfl: 1 .  azithromycin (ZITHROMAX Z-PAK) 250 MG tablet, Take as directed, Disp: 6 each, Rfl: 0 .  chlorpheniramine-HYDROcodone (TUSSIONEX PENNKINETIC ER) 10-8 MG/5ML SUER, Take 5 mLs by mouth every 12 (twelve) hours as needed., Disp: 140 mL, Rfl: 0 .  predniSONE (DELTASONE) 20 MG tablet, Take 1 tablet (20 mg total) by mouth daily with breakfast., Disp: 10 tablet, Rfl: 0  No Known Allergies   ROS  Ten systems reviewed and is negative except as mentioned in HPI   Objective  Filed Vitals:   08/15/15 0908  BP: 138/78  Pulse: 86  Temp: 98.2 F (36.8 C)  TempSrc: Oral  Resp: 18  Height: 5\' 9"  (1.753 m)  Weight: 227 lb 3.2 oz (103.057 kg)  SpO2: 96%    Body mass index is 33.54 kg/(m^2).  Physical Exam  Constitutional: Patient appears well-developed and well-nourished. Obese  No distress.  HEENT: head atraumatic, normocephalic, pupils equal and reactive to light, ears normal TM bilaterally, boggy turbinates, no post-nasal drainage neck supple, throat within normal limits Cardiovascular: Normal rate, regular rhythm and normal heart sounds.  No murmur heard. No BLE edema. Pulmonary/Chest: Effort normal and breath sounds normal. No respiratory distress. Abdominal: Soft.  There is no tenderness. Psychiatric: Patient has a normal mood and affect. behavior is normal. Judgment and thought content normal.  PHQ2/9: Depression screen [facility] 2/9 08/15/2015 05/02/2015 10/08/2014  Decreased Interest 0 0 0  Down, Depressed, Hopeless 0 0 0  PHQ - 2 Score 0 0 0    Fall Risk: Fall Risk  08/15/2015 05/02/2015 10/08/2014  Falls in the past year? Yes Yes No  Number falls in past yr: 1 1 -  Injury with Fall? No No -     Functional Status Survey: Is the patient deaf or have difficulty hearing?: No Does the patient have difficulty seeing, even when wearing glasses/contacts?: No Does the patient have difficulty concentrating, remembering, or making decisions?: No Does the patient have difficulty  walking or climbing stairs?: No Does the patient have difficulty dressing or bathing?: No Does the patient have difficulty doing errands alone such as visiting a doctor's office or shopping?: No    Assessment & Plan  1. Cough  - chlorpheniramine-HYDROcodone (TUSSIONEX PENNKINETIC ER) 10-8 MG/5ML SUER; Take 5 mLs by mouth every 12 (twelve) hours as needed.  Dispense: 140 mL; Refill: 0 - predniSONE (DELTASONE) 20 MG tablet; Take 1 tablet (20 mg total) by mouth daily with breakfast.  Dispense: 10 tablet; Refill: 0 - azithromycin (ZITHROMAX Z-PAK) 250 MG tablet; Take as directed  Dispense: 6 each; Refill: 0  2. Upper respiratory infection  Continue otc medication   3. Bronchitis  - chlorpheniramine-HYDROcodone (TUSSIONEX PENNKINETIC ER) 10-8 MG/5ML SUER; Take 5 mLs by mouth every 12 (  twelve) hours as needed.  Dispense: 140 mL; Refill: 0 - predniSONE (DELTASONE) 20 MG tablet; Take 1 tablet (20 mg total) by mouth daily with breakfast.  Dispense: 10 tablet; Refill: 0 - azithromycin (ZITHROMAX Z-PAK) 250 MG tablet; Take as directed  Dispense: 6 each; Refill: 0 Do not drive while taking Tussionex, call back for CXR and labs if no improvement of symptoms. Excuse for work till Monday

## 2015-09-02 ENCOUNTER — Encounter: Payer: Self-pay | Admitting: Hematology and Oncology

## 2015-09-02 ENCOUNTER — Inpatient Hospital Stay (HOSPITAL_BASED_OUTPATIENT_CLINIC_OR_DEPARTMENT_OTHER): Payer: BC Managed Care – PPO | Admitting: Hematology and Oncology

## 2015-09-02 ENCOUNTER — Inpatient Hospital Stay: Payer: BC Managed Care – PPO | Attending: Hematology and Oncology

## 2015-09-02 VITALS — BP 147/90 | HR 80 | Temp 96.9°F | Resp 16 | Ht 69.0 in | Wt 231.6 lb

## 2015-09-02 DIAGNOSIS — D6851 Activated protein C resistance: Secondary | ICD-10-CM

## 2015-09-02 DIAGNOSIS — K5730 Diverticulosis of large intestine without perforation or abscess without bleeding: Secondary | ICD-10-CM

## 2015-09-02 DIAGNOSIS — Z7901 Long term (current) use of anticoagulants: Secondary | ICD-10-CM | POA: Insufficient documentation

## 2015-09-02 DIAGNOSIS — Z7982 Long term (current) use of aspirin: Secondary | ICD-10-CM | POA: Diagnosis not present

## 2015-09-02 DIAGNOSIS — I81 Portal vein thrombosis: Secondary | ICD-10-CM

## 2015-09-02 DIAGNOSIS — Z79899 Other long term (current) drug therapy: Secondary | ICD-10-CM | POA: Insufficient documentation

## 2015-09-02 DIAGNOSIS — R161 Splenomegaly, not elsewhere classified: Secondary | ICD-10-CM

## 2015-09-02 DIAGNOSIS — I10 Essential (primary) hypertension: Secondary | ICD-10-CM | POA: Diagnosis not present

## 2015-09-02 DIAGNOSIS — E785 Hyperlipidemia, unspecified: Secondary | ICD-10-CM | POA: Diagnosis not present

## 2015-09-02 LAB — CBC WITH DIFFERENTIAL/PLATELET
Basophils Absolute: 0 10*3/uL (ref 0–0.1)
Basophils Relative: 0 %
Eosinophils Absolute: 0.1 10*3/uL (ref 0–0.7)
Eosinophils Relative: 1 %
HCT: 41.6 % (ref 35.0–47.0)
Hemoglobin: 14.2 g/dL (ref 12.0–16.0)
Lymphocytes Relative: 24 %
Lymphs Abs: 1.8 10*3/uL (ref 1.0–3.6)
MCH: 29.2 pg (ref 26.0–34.0)
MCHC: 34.1 g/dL (ref 32.0–36.0)
MCV: 85.6 fL (ref 80.0–100.0)
Monocytes Absolute: 0.7 10*3/uL (ref 0.2–0.9)
Monocytes Relative: 10 %
Neutro Abs: 4.8 10*3/uL (ref 1.4–6.5)
Neutrophils Relative %: 65 %
Platelets: 166 10*3/uL (ref 150–440)
RBC: 4.86 MIL/uL (ref 3.80–5.20)
RDW: 14.8 % — ABNORMAL HIGH (ref 11.5–14.5)
WBC: 7.5 10*3/uL (ref 3.6–11.0)

## 2015-09-02 LAB — COMPREHENSIVE METABOLIC PANEL
ALT: 40 U/L (ref 14–54)
AST: 30 U/L (ref 15–41)
Albumin: 3.9 g/dL (ref 3.5–5.0)
Alkaline Phosphatase: 119 U/L (ref 38–126)
Anion gap: 6 (ref 5–15)
BUN: 11 mg/dL (ref 6–20)
CO2: 28 mmol/L (ref 22–32)
Calcium: 9.1 mg/dL (ref 8.9–10.3)
Chloride: 104 mmol/L (ref 101–111)
Creatinine, Ser: 0.74 mg/dL (ref 0.44–1.00)
GFR calc Af Amer: >60 mL/min (ref >60)
GFR calc non Af Amer: >60 mL/min (ref >60)
Glucose, Bld: 117 mg/dL — ABNORMAL HIGH (ref 65–99)
Potassium: 4.2 mmol/L (ref 3.5–5.1)
Sodium: 138 mmol/L (ref 135–145)
Total Bilirubin: 1.1 mg/dL (ref 0.3–1.2)
Total Protein: 7.6 g/dL (ref 6.5–8.1)

## 2015-09-02 MED ORDER — RIVAROXABAN 20 MG PO TABS: 20.0000 mg | ORAL_TABLET | Freq: Every day | ORAL | Status: DC

## 2015-09-02 NOTE — Progress Notes (Signed)
No changes since last visit. 

## 2015-09-02 NOTE — Progress Notes (Signed)
[facility] day:  09/02/2015   Chief Complaint: Emily Pitts is a 60 y.o. female with portal vein thrombosis who is seen for 4 month assessment on Xarelto.  HPI:   The patient was last seen in the hemaology clinic on 05/02/2015.  At that time, she felt good. Exam was unremarkable.  Labs at last visit included the following: JAK2 V617F and exon 12 were negative.  D-dimers were 451.  CBC and CMP were normal.  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 There was interval reconstitution of previously noted thrombosis within the peripheral branches of the right portal vein. The right and main portal veins appeared widely patent.  A discrete hepatic lesion/mass was not identified.  There was no morphologic evidence of cirrhosis. There was unchanged borderline splenomegaly without evidence of gastroesophageal varices or ascites to suggest significant portal venous hypertension.  There was extensive colonic diverticulosis without evidence of diverticulitis.  She saw Dr. Bary Castilla in follow-up on 05/30/2015.  It was noted that her last colonoscopy in 06/2013 revealed only diverticulosis.  During the interim, she has done well. He has a follow-up with Dr. Ancil Boozer every 6 months. She states that [facility] orders her mammogram (due 09/2015). She is doing well on Xarelto. She denies any bruising or bleeding.  She denies any abdominal symptoms.   Past Medical History  Diagnosis Date  . Hypertension   . Hyperlipidemia   . Allergy   . Diabetes mellitus without complication ([place]) 9417  . Vitamin D deficiency   . Numbness of feet   . Low back pain, episodic     Past Surgical History  Procedure Laterality Date  . Appendectomy  1966  . Abdominal hysterectomy  2010  . Dilation and curettage of uterus    . Colonoscopy  06-07-13    Dr Bary Castilla    Family History  Problem Relation Age of Onset  .  Diabetes Mother   . COPD Mother   . Diabetes Father   . Hypertension Father   . Cancer Father     Kidney and Prostate  . CAD Father   . Diabetes Brother     Oldest Brother  Her grandfather had a CVA in his 13s.  A niece had an early pregnancy loss.   Social History:  reports that she has never smoked. She has never used smokeless tobacco. She reports that she does not drink alcohol or use illicit drugs.  She does not have any children.  The patient is alone today.  Allergies: No Known Allergies  Current Medications: Current Outpatient Prescriptions  Medication Sig Dispense Refill  . aspirin EC 81 MG tablet Take 81 mg by mouth daily.    Marland Kitchen atorvastatin (LIPITOR) 40 MG tablet Take 1 tablet (40 mg total) by mouth daily. 90 tablet 1  . chlorpheniramine-HYDROcodone (TUSSIONEX PENNKINETIC ER) 10-8 MG/5ML SUER Take 5 mLs by mouth every 12 (twelve) hours as needed. 140 mL 0  . cholecalciferol (VITAMIN D) 1000 units tablet Take 1,000 Units by mouth daily.    . fluticasone (FLONASE) 50 MCG/ACT nasal spray Place 2 sprays into both nostrils as needed.    Marland Kitchen glucose blood (ONE TOUCH ULTRA TEST) test strip Use as instructed 100 each 12  . levocetirizine (XYZAL) 5 MG tablet Take 1 tablet (5 mg total) by mouth daily. 90 tablet 1  . losartan (COZAAR) 100 MG tablet Take 1 tablet (  100 mg total) by mouth daily. 90 tablet 1  . magnesium oxide (MAG-OX) 400 MG tablet Take 400 mg by mouth daily.    . montelukast (SINGULAIR) 10 MG tablet Take 1 tablet (10 mg total) by mouth at bedtime. 90 tablet 1  . vitamin B-12 (CYANOCOBALAMIN) 1000 MCG tablet Take 1,000 mcg by mouth daily.    . vitamin C (ASCORBIC ACID) 500 MG tablet Take 500 mg by mouth daily.    Alveda Reasons 20 MG TABS tablet TAKE ONE TABLET BY MOUTH ONCE DAILY WITH SUPPER 30 tablet 1   No current facility-administered medications for this visit.    Review of Systems:  GENERAL:  Feels good.  No fevers or sweats.  Initial weight loss. Weight down 1 pound  since last visit. PERFORMANCE STATUS (ECOG):  0 HEENT:  No visual changes, runny nose, sore throat, mouth sores or tenderness. Lungs: No shortness of breath or cough.  No hemoptysis. Cardiac:  No chest pain, palpitations, orthopnea, or PND.  Dizzy when getting up.  BP meds being adjusted. GI:  No nausea, vomiting, diarrhea, constipation, melena or hematochezia.  Colonoscopy in 06/2013. GU:  No urgency, frequency, dysuria, or hematuria. Musculoskeletal:  No back pain.  No joint pain.  No muscle tenderness. Extremities:  No pain or swelling. Skin:  No rashes or skin changes. Neuro:  No headache, numbness or weakness, balance or coordination issues. Endocrine:  Diabetes.  No thyroid issues, hot flashes or night sweats. Psych:  No mood changes, depression or anxiety. Pain:  No focal pain. Review of systems:  All other systems reviewed and found to be negative.  Physical Exam: Blood pressure 147/90, pulse 80, temperature 96.9 F (36.1 C), temperature source Tympanic, resp. rate 16, height _0  (1.753 m), weight 231 lb 9.5 oz (105.05 kg). GENERAL:  Well developed, well nourished, sitting comfortably in the exam room in no acute distress. MENTAL STATUS:  Alert and oriented to person, place and time. HEAD:  Short curly red hair.  Normocephalic, atraumatic, face symmetric, no Cushingoid features. EYES:  Blue eyes.  Pupils equal round and reactive to light and accomodation.  No conjunctivitis or scleral icterus. ENT:  Oropharynx clear without lesion.  Tongue normal. Mucous membranes moist.  RESPIRATORY:  Clear to auscultation without rales, wheezes or rhonchi. CARDIOVASCULAR:  Regular rate and rhythm without murmur, rub or gallop. ABDOMEN:  Soft, non-tender, with active bowel sounds, and no appreciable hepatosplenomegaly.  No masses. SKIN:  No rashes, ulcers or lesions. EXTREMITIES: No edema, no skin discoloration or tenderness.  No palpable cords. LYMPH NODES: No palpable cervical,  supraclavicular, axillary or inguinal adenopathy  NEUROLOGICAL: Unremarkable. PSYCH:  Appropriate.  Appointment on 09/02/2015  Component Date Value Ref Range Status  . WBC 09/02/2015 7.5  3.6 - 11.0 K/uL Final  . RBC 09/02/2015 4.86  3.80 - 5.20 MIL/uL Final  . Hemoglobin 09/02/2015 14.2  12.0 - 16.0 g/dL Final  . HCT 09/02/2015 41.6  35.0 - 47.0 % Final  . MCV 09/02/2015 85.6  80.0 - 100.0 fL Final  . MCH 09/02/2015 29.2  26.0 - 34.0 pg Final  . MCHC 09/02/2015 34.1  32.0 - 36.0 g/dL Final  . RDW 09/02/2015 14.8* 11.5 - 14.5 % Final  . Platelets 09/02/2015 166  150 - 440 K/uL Final  . Neutrophils Relative % 09/02/2015 65   Final  . Neutro Abs 09/02/2015 4.8  1.4 - 6.5 K/uL Final  . Lymphocytes Relative 09/02/2015 24   Final  . Lymphs Abs 09/02/2015  1.8  1.0 - 3.6 K/uL Final  . Monocytes Relative 09/02/2015 10   Final  . Monocytes Absolute 09/02/2015 0.7  0.2 - 0.9 K/uL Final  . Eosinophils Relative 09/02/2015 1   Final  . Eosinophils Absolute 09/02/2015 0.1  0 - 0.7 K/uL Final  . Basophils Relative 09/02/2015 0   Final  . Basophils Absolute 09/02/2015 0.0  0 - 0.1 K/uL Final  . Sodium 09/02/2015 138  135 - 145 mmol/L Final  . Potassium 09/02/2015 4.2  3.5 - 5.1 mmol/L Final  . Chloride 09/02/2015 104  101 - 111 mmol/L Final  . CO2 09/02/2015 28  22 - 32 mmol/L Final  . Glucose, Bld 09/02/2015 117* 65 - 99 mg/dL Final  . BUN 09/02/2015 11  6 - 20 mg/dL Final  . Creatinine, Ser 09/02/2015 0.74  0.44 - 1.00 mg/dL Final  . Calcium 09/02/2015 9.1  8.9 - 10.3 mg/dL Final  . Total Protein 09/02/2015 7.6  6.5 - 8.1 g/dL Final  . Albumin 09/02/2015 3.9  3.5 - 5.0 g/dL Final  . AST 09/02/2015 30  15 - 41 U/L Final  . ALT 09/02/2015 40  14 - 54 U/L Final  . Alkaline Phosphatase 09/02/2015 119  38 - 126 U/L Final  . Total Bilirubin 09/02/2015 1.1  0.3 - 1.2 mg/dL Final  . GFR calc non Af Amer 09/02/2015 >60  >60 mL/min Final  . GFR calc Af Amer 09/02/2015 >60  >60 mL/min Final    Comment: (NOTE) The eGFR has been calculated using the CKD EPI equation. This calculation has not been validated in all clinical situations. eGFR's persistently <60 mL/min signify possible Chronic Kidney Disease.   . Anion gap 09/02/2015 6  5 - 15 Final    Assessment:  Emily Pitts is a 60 y.o. female with portal vein thrombosis presenting with fever, chills, dehydration, weight loss, and abdominal pain.  Symptoms lasted for about 4 weeks.  Abdominal and pelvic CT on 11/07/2014 revealed a partially thrombosed portal veins (left > right).  The main portal vein and mesenteric veins remained patent.  Porta hepatis nodes measured 1.2 cm in short axis (stable).  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  The following studies were normal:  prothrombin gene mutation, lupus anticoagulant panel, anticardiolipin antibodies, homocysteine, protein C antigen (88%), protein C activity (111%), protein S antigen total (131%), protein S antigen free (108%), anti-thrombin III antigen (88%), anti-thrombin III functional (101%).  On 12/11/2014, protein S antigen total (95%), protein S antigen free (77%), and protein S activity (69%) were normal.  PNH by flow cytometry was negative.  JAK2 V617F and exon 12 were negative on 05/02/2015.  Additional testing on 11/27/2014 revealed resolution of anemia.  The following studies were normal:  SPEP, free light chain ratio, 24 hour UPEP, IgG, IgA, ANA, ESR, ferritin, reticulocyte count, B12, and folate.  Serum protein electrophoresis revealed no monoclonal protein.  Kappa free light chains were 26.67, lambda free light chains 24.67 and ratio 1.08 (normal).   IgM was 247 (26-217).    She was started on Xarelto on 11/08/2014.  Ultrasound hepatic doppler on 05/02/2015 revealed no convincing evidence of occlusion or thrombus.  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  There was interval reconstitution of previously noted thrombosis within the peripheral branches of the right portal vein. The right and main portal veins appeared widely patent.  A discrete hepatic lesion/mass  was not identified.  There was no evidence of cirrhosis. There was unchanged borderline splenomegaly without evidence of gastroesophageal varices or ascites.  There was extensive colonic diverticulosis.  She denies any prior history of thrombosis.  She has not been on birth control pills.  She denies any family history of thrombosis.   Screening studies for malignancy were normal.  Chest x-ray on 11/05/2014 was negative.  Mammogram in 09/2014 was negative per patient report.  Colonoscopy on 06/07/2013 revealed only diverticulosis.    Symptomatically, she feels good.  Exam is unremarkable.  Plan: 1.  Labs today:  CBC with diff, CMP. 2.  Review interval scans and labs.   3.  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   4.  Continue Xarelto 20 mg a day. 5.  RTC in 4 months for MD assessment and labs (CBC with diff, CMP).   Lequita Asal, MD  09/02/2015, 11:46 AM

## 2015-10-16 ENCOUNTER — Other Ambulatory Visit: Payer: Self-pay | Admitting: Family Medicine

## 2015-10-16 DIAGNOSIS — Z1231 Encounter for screening mammogram for malignant neoplasm of breast: Secondary | ICD-10-CM

## 2015-10-25 ENCOUNTER — Telehealth: Payer: Self-pay | Admitting: Family Medicine

## 2015-10-28 ENCOUNTER — Other Ambulatory Visit: Payer: Self-pay | Admitting: Family Medicine

## 2015-10-28 ENCOUNTER — Other Ambulatory Visit: Payer: Self-pay

## 2015-10-28 DIAGNOSIS — R809 Proteinuria, unspecified: Secondary | ICD-10-CM

## 2015-10-28 DIAGNOSIS — I10 Essential (primary) hypertension: Secondary | ICD-10-CM

## 2015-10-28 DIAGNOSIS — E1129 Type 2 diabetes mellitus with other diabetic kidney complication: Secondary | ICD-10-CM

## 2015-10-28 NOTE — Telephone Encounter (Signed)
Printed. She can go to [facility]. I am checking hgbA1C and comp panel,  so the day of her visit we don't have to check it here

## 2015-10-28 NOTE — Telephone Encounter (Signed)
Patient notified and order is upfront for patient to pick up.

## 2015-10-30 ENCOUNTER — Ambulatory Visit (INDEPENDENT_AMBULATORY_CARE_PROVIDER_SITE_OTHER): Payer: BC Managed Care – PPO | Admitting: Family Medicine

## 2015-10-30 ENCOUNTER — Ambulatory Visit: Payer: BC Managed Care – PPO

## 2015-10-30 ENCOUNTER — Telehealth: Payer: Self-pay | Admitting: Family Medicine

## 2015-10-30 ENCOUNTER — Encounter: Payer: Self-pay | Admitting: Family Medicine

## 2015-10-30 VITALS — BP 126/66 | HR 88 | Temp 97.7°F | Resp 18 | Ht 69.0 in | Wt 237.2 lb

## 2015-10-30 DIAGNOSIS — K219 Gastro-esophageal reflux disease without esophagitis: Secondary | ICD-10-CM

## 2015-10-30 DIAGNOSIS — E1129 Type 2 diabetes mellitus with other diabetic kidney complication: Secondary | ICD-10-CM

## 2015-10-30 DIAGNOSIS — E785 Hyperlipidemia, unspecified: Secondary | ICD-10-CM

## 2015-10-30 DIAGNOSIS — M25511 Pain in right shoulder: Secondary | ICD-10-CM

## 2015-10-30 DIAGNOSIS — J302 Other seasonal allergic rhinitis: Secondary | ICD-10-CM

## 2015-10-30 DIAGNOSIS — E559 Vitamin D deficiency, unspecified: Secondary | ICD-10-CM

## 2015-10-30 DIAGNOSIS — I81 Portal vein thrombosis: Secondary | ICD-10-CM

## 2015-10-30 DIAGNOSIS — J309 Allergic rhinitis, unspecified: Secondary | ICD-10-CM | POA: Diagnosis not present

## 2015-10-30 DIAGNOSIS — J3089 Other allergic rhinitis: Secondary | ICD-10-CM

## 2015-10-30 DIAGNOSIS — I10 Essential (primary) hypertension: Secondary | ICD-10-CM

## 2015-10-30 MED ORDER — ATORVASTATIN CALCIUM 40 MG PO TABS: 40.0000 mg | ORAL_TABLET | Freq: Every day | ORAL | 1 refills | Status: DC

## 2015-10-30 MED ORDER — LOSARTAN POTASSIUM-HCTZ 100-12.5 MG PO TABS: 1.0000 | ORAL_TABLET | Freq: Every day | ORAL | 1 refills | Status: DC

## 2015-10-30 MED ORDER — LEVOCETIRIZINE DIHYDROCHLORIDE 5 MG PO TABS: 5.0000 mg | ORAL_TABLET | Freq: Every day | ORAL | 1 refills | Status: DC

## 2015-10-30 NOTE — Progress Notes (Signed)
Name: Emily Pitts   MRN: [id number]    DOB: 02-14-1956   Date:10/30/2015       Progress Note  Subjective  Chief Complaint  Chief Complaint  Patient presents with  . Diabetes    pt here for 6 month follow up  . Hyperlipidemia  . Hypertension    HPI  Diabetes Type II with microalbuminuria: on diet only and hgbA1C was drawn at the lab this morning and result is pending She denies blurred vision,  polyphagia, polyuria or polydipsia. Chronic nocturia once per night - she drinks a lot of water in the evenings. Complaint with diabetic diet. Glucose fasting is around 130's, post-prandially as high as 304. She is willing to resume Metformin if glucose is not at goal.   Obesity: she is going home after work, eating watching TV and falling asleep on recliner. She used to walk with her friend in the evenings, but she moved to another area and she has not been active since.   HTN: she is on Losartan 100 mg and bp has been elevated at home. It is controlled today, we will add HCTZ but advised to monitor for dizziness. She denies side effects of medication. No chest pain, SOB or palpitation   Hyperlipidemia: she is now on Atorvastatin and denies side effects. Denies side effects  GERD: she stopped taking Omeprazole, controlled with life style modification, no heartburn or regurgitation  Perennial AR with seasonal variation: she is feeling better now, but over the past week she has noticed more clear rhinorrhea, and occasional sneezing. Still taking Xyzal, and singulair only prn.   Right shoulder pain: she has noticed a gradual and progressively worse pain on right shoulder over the past 7 months. Pain is worse with abduction and internal rotation, pain is now constant aching pain, but sharp with movement. At this time is 6/10. She denies weakness. She has not been taking medication for it.   Portal Vein Thrombosis: seen hematologist, taking Xarelto, denies any bleeding.   Patient Active Problem  List   Diagnosis Date Noted  . Factor 5 Leiden mutation, heterozygous ([place]) 05/02/2015  . Portal vein thrombosis 11/07/2014  . History of sepsis 10/10/2014  . Abnormal electrocardiogram 10/08/2014  . Nonspecific abnormal electromyogram (EMG) 10/08/2014  . Benign essential HTN 10/08/2014  . Diabetes mellitus with renal manifestation ([place]) 10/08/2014  . Dyslipidemia 10/08/2014  . LBP (low back pain) 10/08/2014  . Gastro-esophageal reflux disease without esophagitis 10/08/2014  . Microalbuminuria 10/08/2014  . Disturbance of skin sensation 10/08/2014  . Obesity (BMI 30-39.9) 10/08/2014  . Perennial allergic rhinitis with seasonal variation 10/08/2014  . Plantar fasciitis 10/08/2014  . Postablative ovarian failure 10/08/2014  . Menopausal symptom 10/08/2014  . Vitamin D deficiency 10/08/2014    Past Surgical History:  Procedure Laterality Date  . ABDOMINAL HYSTERECTOMY  2010  . APPENDECTOMY  1966  . COLONOSCOPY  06-07-13   Dr Bary Castilla  . DILATION AND CURETTAGE OF UTERUS      Family History  Problem Relation Age of Onset  . Diabetes Mother   . COPD Mother   . Diabetes Father   . Hypertension Father   . Cancer Father     Kidney and Prostate  . CAD Father   . Diabetes Brother     Oldest Brother    Social History   Social History  . Marital status: Single    Spouse name: N/A  . Number of children: N/A  . Years of education: N/A  Occupational History  . Not on file.   Social History Main Topics  . Smoking status: Never Smoker  . Smokeless tobacco: Never Used  . Alcohol use No  . Drug use: No  . Sexual activity: Not on file   Other Topics Concern  . Not on file   Social History Narrative  . No narrative on file     Current Outpatient Prescriptions:  .  aspirin EC 81 MG tablet, Take 81 mg by mouth daily., Disp: , Rfl:  .  atorvastatin (LIPITOR) 40 MG tablet, Take 1 tablet (40 mg total) by mouth daily., Disp: 90 tablet, Rfl: 1 .  cholecalciferol (VITAMIN D)  1000 units tablet, Take 1,000 Units by mouth daily., Disp: , Rfl:  .  fluticasone (FLONASE) 50 MCG/ACT nasal spray, Place 2 sprays into both nostrils as needed., Disp: , Rfl:  .  glucose blood (ONE TOUCH ULTRA TEST) test strip, Use as instructed, Disp: 100 each, Rfl: 12 .  levocetirizine (XYZAL) 5 MG tablet, Take 1 tablet (5 mg total) by mouth daily., Disp: 90 tablet, Rfl: 1 .  magnesium oxide (MAG-OX) 400 MG tablet, Take 400 mg by mouth daily., Disp: , Rfl:  .  montelukast (SINGULAIR) 10 MG tablet, Take 1 tablet (10 mg total) by mouth at bedtime., Disp: 90 tablet, Rfl: 1 .  rivaroxaban (XARELTO) 20 MG TABS tablet, Take 1 tablet (20 mg total) by mouth daily with supper., Disp: 30 tablet, Rfl: 5 .  vitamin B-12 (CYANOCOBALAMIN) 1000 MCG tablet, Take 1,000 mcg by mouth daily., Disp: , Rfl:  .  vitamin C (ASCORBIC ACID) 500 MG tablet, Take 500 mg by mouth daily., Disp: , Rfl:   No Known Allergies   ROS  Constitutional: Negative for fever , positive for  weight change.  Respiratory: Negative for cough and shortness of breath.   Cardiovascular: Negative for chest pain or palpitations.  Gastrointestinal: Negative for abdominal pain, no bowel changes.  Musculoskeletal: Negative for gait problem or joint swelling.  Skin: Negative for rash.  Neurological: Negative for dizziness or headache.  No other specific complaints in a complete review of systems (except as listed in HPI above).  Objective  Vitals:   10/30/15 0912  BP: 126/66  Pulse: 88  Resp: 18  Temp: 97.7 F (36.5 C)  SpO2: 96%  Weight: 237 lb 3 oz (107.6 kg)  Height: 5' 9"  (1.753 m)    Body mass index is 35.03 kg/m.  Physical Exam  Constitutional: Patient appears well-developed and well-nourished. Obese  No distress.  HEENT: head atraumatic, normocephalic, pupils equal and reactive to light,  neck supple, throat within normal limits Cardiovascular: Normal rate, regular rhythm and normal heart sounds.  No murmur heard. No  BLE edema. Pulmonary/Chest: Effort normal and breath sounds normal. No respiratory distress. Abdominal: Soft.  There is no tenderness. Psychiatric: Patient has a normal mood and affect. behavior is normal. Judgment and thought content normal. Muscular Skeletal: pain during abduction of right shoulder and also palpation of lateral and anterior right shoulder, pain with internal rotation   Recent Results (from the past 2160 hour(s))  CBC with Differential     Status: Abnormal   Collection Time: 09/02/15  9:49 AM  Result Value Ref Range   WBC 7.5 3.6 - 11.0 K/uL   RBC 4.86 3.80 - 5.20 MIL/uL   Hemoglobin 14.2 12.0 - 16.0 g/dL   HCT 41.6 35.0 - 47.0 %   MCV 85.6 80.0 - 100.0 fL   MCH 29.2 26.0 -  34.0 pg   MCHC 34.1 32.0 - 36.0 g/dL   RDW 14.8 (H) 11.5 - 14.5 %   Platelets 166 150 - 440 K/uL   Neutrophils Relative % 65 %   Neutro Abs 4.8 1.4 - 6.5 K/uL   Lymphocytes Relative 24 %   Lymphs Abs 1.8 1.0 - 3.6 K/uL   Monocytes Relative 10 %   Monocytes Absolute 0.7 0.2 - 0.9 K/uL   Eosinophils Relative 1 %   Eosinophils Absolute 0.1 0 - 0.7 K/uL   Basophils Relative 0 %   Basophils Absolute 0.0 0 - 0.1 K/uL  Comprehensive metabolic panel     Status: Abnormal   Collection Time: 09/02/15  9:49 AM  Result Value Ref Range   Sodium 138 135 - 145 mmol/L   Potassium 4.2 3.5 - 5.1 mmol/L   Chloride 104 101 - 111 mmol/L   CO2 28 22 - 32 mmol/L   Glucose, Bld 117 (H) 65 - 99 mg/dL   BUN 11 6 - 20 mg/dL   Creatinine, Ser 0.74 0.44 - 1.00 mg/dL   Calcium 9.1 8.9 - 10.3 mg/dL   Total Protein 7.6 6.5 - 8.1 g/dL   Albumin 3.9 3.5 - 5.0 g/dL   AST 30 15 - 41 U/L   ALT 40 14 - 54 U/L   Alkaline Phosphatase 119 38 - 126 U/L   Total Bilirubin 1.1 0.3 - 1.2 mg/dL   GFR calc non Af Amer >60 >60 mL/min   GFR calc Af Amer >60 >60 mL/min    Comment: (NOTE) The eGFR has been calculated using the CKD EPI equation. This calculation has not been validated in all clinical situations. eGFR's persistently  <60 mL/min signify possible Chronic Kidney Disease.    Anion gap 6 5 - 15      PHQ2/9: Depression screen [facility] 2/9 10/30/2015 08/15/2015 05/02/2015 10/08/2014  Decreased Interest 0 0 0 0  Down, Depressed, Hopeless 0 0 0 0  PHQ - 2 Score 0 0 0 0     Fall Risk: Fall Risk  10/30/2015 08/15/2015 05/02/2015 10/08/2014  Falls in the past year? No Yes Yes No  Number falls in past yr: - 1 1 -  Injury with Fall? - No No -    Functional Status Survey: Is the patient deaf or have difficulty hearing?: No Does the patient have difficulty seeing, even when wearing glasses/contacts?: No Does the patient have difficulty concentrating, remembering, or making decisions?: No Does the patient have difficulty walking or climbing stairs?: No Does the patient have difficulty dressing or bathing?: No Does the patient have difficulty doing errands alone such as visiting a doctor's office or shopping?: No    Assessment & Plan  1. 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 Benign essential HTN  - losartan-hydrochlorothiazide (HYZAAR) 100-12.5 MG tablet; Take 1 tablet by mouth daily.  Dispense: 90 tablet; Refill: 1  3. Dyslipidemia  - atorvastatin (LIPITOR) 40 MG tablet; Take 1 tablet (40 mg total) by mouth daily.  Dispense: 90 tablet; Refill: 1  4. Portal vein thrombosis  Sees Hematologist   5. Gastro-esophageal reflux disease without esophagitis  Doing well with life style modification   6. Perennial allergic rhinitis with seasonal variation  - levocetirizine (XYZAL) 5 MG tablet; Take 1 tablet (5 mg total) by mouth daily.  Dispense: 90 tablet; Refill: 1  7. Vitamin D deficiency  Continue otc supplementation    8. Right shoulder pain  - Ambulatory referral to Orthopedic Surgery

## 2015-10-31 LAB — COMPREHENSIVE METABOLIC PANEL
A/G RATIO: 1.4 (ref 1.2–2.2)
ALK PHOS: 118 IU/L — AB (ref 39–117)
ALT: 27 IU/L (ref 0–32)
AST: 21 IU/L (ref 0–40)
Albumin: 4 g/dL (ref 3.6–4.8)
BUN/Creatinine Ratio: 14 (ref 12–28)
BUN: 10 mg/dL (ref 8–27)
Bilirubin Total: 0.6 mg/dL (ref 0.0–1.2)
CALCIUM: 9.1 mg/dL (ref 8.7–10.3)
CO2: 24 mmol/L (ref 18–29)
Chloride: 103 mmol/L (ref 96–106)
Creatinine, Ser: 0.74 mg/dL (ref 0.57–1.00)
GFR calc Af Amer: 101 mL/min/{1.73_m2} (ref >59)
GFR, EST NON AFRICAN AMERICAN: 88 mL/min/{1.73_m2} (ref >59)
Globulin, Total: 2.8 g/dL (ref 1.5–4.5)
Glucose: 121 mg/dL — ABNORMAL HIGH (ref 65–99)
POTASSIUM: 4.7 mmol/L (ref 3.5–5.2)
Sodium: 143 mmol/L (ref 134–144)
Total Protein: 6.8 g/dL (ref 6.0–8.5)

## 2015-10-31 LAB — HEMOGLOBIN A1C
Est. average glucose Bld gHb Est-mCnc: 140 mg/dL
Hgb A1c MFr Bld: 6.5 % — ABNORMAL HIGH (ref 4.8–5.6)

## 2015-11-13 ENCOUNTER — Ambulatory Visit: Payer: BC Managed Care – PPO

## 2015-11-14 ENCOUNTER — Ambulatory Visit
Admission: RE | Admit: 2015-11-14 | Discharge: 2015-11-14 | Disposition: A | Discharge status: Home / Self Care | Payer: BC Managed Care – PPO | Source: Ambulatory Visit | Attending: Family Medicine | Admitting: Family Medicine

## 2015-11-14 ENCOUNTER — Other Ambulatory Visit: Payer: Self-pay | Admitting: Family Medicine

## 2015-11-14 DIAGNOSIS — R928 Other abnormal and inconclusive findings on diagnostic imaging of breast: Secondary | ICD-10-CM | POA: Diagnosis not present

## 2015-11-14 DIAGNOSIS — Z1231 Encounter for screening mammogram for malignant neoplasm of breast: Secondary | ICD-10-CM | POA: Diagnosis not present

## 2015-11-15 ENCOUNTER — Other Ambulatory Visit: Payer: Self-pay | Admitting: Family Medicine

## 2015-11-15 DIAGNOSIS — N6489 Other specified disorders of breast: Secondary | ICD-10-CM

## 2015-11-18 ENCOUNTER — Other Ambulatory Visit: Payer: Self-pay | Admitting: Family Medicine

## 2015-11-18 DIAGNOSIS — R928 Other abnormal and inconclusive findings on diagnostic imaging of breast: Secondary | ICD-10-CM

## 2015-11-25 ENCOUNTER — Ambulatory Visit
Admission: RE | Admit: 2015-11-25 | Discharge: 2015-11-25 | Disposition: A | Discharge status: Home / Self Care | Payer: BC Managed Care – PPO | Source: Ambulatory Visit | Attending: Family Medicine | Admitting: Family Medicine

## 2015-11-25 DIAGNOSIS — R928 Other abnormal and inconclusive findings on diagnostic imaging of breast: Secondary | ICD-10-CM | POA: Insufficient documentation

## 2015-11-25 DIAGNOSIS — N6489 Other specified disorders of breast: Secondary | ICD-10-CM

## 2015-11-29 NOTE — Telephone Encounter (Signed)
Please close encounter

## 2015-12-22 ENCOUNTER — Other Ambulatory Visit: Payer: Self-pay | Admitting: Family Medicine

## 2016-01-02 ENCOUNTER — Inpatient Hospital Stay: Payer: BC Managed Care – PPO | Attending: Hematology and Oncology

## 2016-01-02 ENCOUNTER — Other Ambulatory Visit: Payer: Self-pay | Admitting: *Deleted

## 2016-01-02 ENCOUNTER — Inpatient Hospital Stay (HOSPITAL_BASED_OUTPATIENT_CLINIC_OR_DEPARTMENT_OTHER): Payer: BC Managed Care – PPO | Admitting: Hematology and Oncology

## 2016-01-02 VITALS — BP 145/81 | HR 73 | Temp 97.1°F | Resp 18 | Wt 242.5 lb

## 2016-01-02 DIAGNOSIS — I81 Portal vein thrombosis: Secondary | ICD-10-CM | POA: Diagnosis not present

## 2016-01-02 DIAGNOSIS — D6851 Activated protein C resistance: Secondary | ICD-10-CM | POA: Diagnosis not present

## 2016-01-02 DIAGNOSIS — E785 Hyperlipidemia, unspecified: Secondary | ICD-10-CM | POA: Insufficient documentation

## 2016-01-02 DIAGNOSIS — Z7901 Long term (current) use of anticoagulants: Secondary | ICD-10-CM

## 2016-01-02 DIAGNOSIS — I10 Essential (primary) hypertension: Secondary | ICD-10-CM | POA: Diagnosis not present

## 2016-01-02 DIAGNOSIS — E559 Vitamin D deficiency, unspecified: Secondary | ICD-10-CM | POA: Diagnosis not present

## 2016-01-02 DIAGNOSIS — E119 Type 2 diabetes mellitus without complications: Secondary | ICD-10-CM | POA: Diagnosis not present

## 2016-01-02 DIAGNOSIS — Z7982 Long term (current) use of aspirin: Secondary | ICD-10-CM | POA: Insufficient documentation

## 2016-01-02 LAB — COMPREHENSIVE METABOLIC PANEL
ALT: 33 U/L (ref 14–54)
AST: 27 U/L (ref 15–41)
Albumin: 3.9 g/dL (ref 3.5–5.0)
Alkaline Phosphatase: 100 U/L (ref 38–126)
Anion gap: 7 (ref 5–15)
BUN: 12 mg/dL (ref 6–20)
CO2: 28 mmol/L (ref 22–32)
Calcium: 9 mg/dL (ref 8.9–10.3)
Chloride: 100 mmol/L — ABNORMAL LOW (ref 101–111)
Creatinine, Ser: 0.78 mg/dL (ref 0.44–1.00)
GFR calc Af Amer: >60 mL/min (ref >60)
GFR calc non Af Amer: >60 mL/min (ref >60)
Glucose, Bld: 114 mg/dL — ABNORMAL HIGH (ref 65–99)
Potassium: 4.5 mmol/L (ref 3.5–5.1)
Sodium: 135 mmol/L (ref 135–145)
Total Bilirubin: 0.7 mg/dL (ref 0.3–1.2)
Total Protein: 7.2 g/dL (ref 6.5–8.1)

## 2016-01-02 LAB — CBC WITH DIFFERENTIAL/PLATELET
Basophils Absolute: 0 10*3/uL (ref 0–0.1)
Basophils Relative: 1 %
Eosinophils Absolute: 0.1 10*3/uL (ref 0–0.7)
Eosinophils Relative: 1 %
HCT: 38.2 % (ref 35.0–47.0)
Hemoglobin: 13.1 g/dL (ref 12.0–16.0)
Lymphocytes Relative: 31 %
Lymphs Abs: 2.2 10*3/uL (ref 1.0–3.6)
MCH: 29.1 pg (ref 26.0–34.0)
MCHC: 34.3 g/dL (ref 32.0–36.0)
MCV: 84.9 fL (ref 80.0–100.0)
Monocytes Absolute: 0.7 10*3/uL (ref 0.2–0.9)
Monocytes Relative: 9 %
Neutro Abs: 4.3 10*3/uL (ref 1.4–6.5)
Neutrophils Relative %: 58 %
Platelets: 174 10*3/uL (ref 150–440)
RBC: 4.5 MIL/uL (ref 3.80–5.20)
RDW: 14.3 % (ref 11.5–14.5)
WBC: 7.3 10*3/uL (ref 3.6–11.0)

## 2016-01-02 NOTE — Progress Notes (Signed)
[facility] day:  01/02/16   Chief Complaint: Emily Pitts is a 60 y.o. female with Factor V Leiden and portal vein thrombosis who is seen for 4 month assessment on Xarelto.  HPI:   The patient was last seen in the hematology clinic on 09/02/2015.  At that time, she felt good. Exam was unremarkable.  CBC and CMP were normal.  Bilateral screening mammogram on 11/13/2005 revealed possible asymmetry in the left breast. There were no suspicious findings in the right breast.  Left-sided diagnostic mammogram on 11/25/2015 revealed no persistent abnormality on additional evaluation of the left breast.  Recommendation was for screening bilateral mammogram in 1 year.  During the interim, she has done well.  She denies any complaint.   Past Medical History:  Diagnosis Date  . Allergy   . Diabetes mellitus without complication ([place]) 9379  . Hyperlipidemia   . Hypertension   . Low back pain, episodic   . Numbness of feet   . Vitamin D deficiency     Past Surgical History:  Procedure Laterality Date  . ABDOMINAL HYSTERECTOMY  2010  . APPENDECTOMY  1966  . COLONOSCOPY  06-07-13   Dr Bary Castilla  . DILATION AND CURETTAGE OF UTERUS      Family History  Problem Relation Age of Onset  . Diabetes Mother   . COPD Mother   . Diabetes Father   . Hypertension Father   . Cancer Father     Kidney and Prostate  . CAD Father   . Diabetes Brother     [employer]  . Breast cancer Neg Hx   Her grandfather had a CVA in his 84s.  A niece had an early pregnancy loss.   Social History:  reports that she has never smoked. She has never used smokeless tobacco. She reports that she does not drink alcohol or use drugs.  She does not have any children.  The patient is alone today.  Allergies: No Known Allergies  Current Medications: Current Outpatient Prescriptions  Medication Sig Dispense Refill  . aspirin EC 81 MG tablet Take 81 mg by mouth daily.    Marland Kitchen  atorvastatin (LIPITOR) 40 MG tablet Take 1 tablet (40 mg total) by mouth daily. 90 tablet 1  . cholecalciferol (VITAMIN D) 1000 units tablet Take 1,000 Units by mouth daily.    . fluticasone (FLONASE) 50 MCG/ACT nasal spray Place 2 sprays into both nostrils as needed.    Marland Kitchen glucose blood (ONE TOUCH ULTRA TEST) test strip Use as instructed 100 each 12  . levocetirizine (XYZAL) 5 MG tablet Take 1 tablet (5 mg total) by mouth daily. 90 tablet 1  . losartan-hydrochlorothiazide (HYZAAR) 100-12.5 MG tablet Take 1 tablet by mouth daily. 90 tablet 1  . magnesium oxide (MAG-OX) 400 MG tablet Take 400 mg by mouth daily.    . montelukast (SINGULAIR) 10 MG tablet Take 1 tablet (10 mg total) by mouth at bedtime. 90 tablet 1  . rivaroxaban (XARELTO) 20 MG TABS tablet Take 1 tablet (20 mg total) by mouth daily with supper. 30 tablet 5  . vitamin B-12 (CYANOCOBALAMIN) 1000 MCG tablet Take 1,000 mcg by mouth daily.    . vitamin C (ASCORBIC ACID) 500 MG tablet Take 500 mg by mouth daily.    . chlorzoxazone (PARAFON) 500 MG tablet TAKE ONE TABLET BY MOUTH THREE TIMES DAILY (Patient not taking: Reported on 01/02/2016) 90 tablet 0   No current facility-administered medications for  this visit.     Review of Systems:  GENERAL:  Feels fine.  No fevers or sweats.  Weight up 11 pounds since last visit. PERFORMANCE STATUS (ECOG):  0 HEENT:  No visual changes, runny nose, sore throat, mouth sores or tenderness. Lungs: No shortness of breath or cough.  No hemoptysis. Cardiac:  No chest pain, palpitations, orthopnea, or PND.  Dizzy when getting up.  BP meds being adjusted. Breasts:  No breast masses.  Interval mammogram (see HPI). GI:  No nausea, vomiting, diarrhea, constipation, melena or hematochezia.  Colonoscopy in 06/2013. GU:  No urgency, frequency, dysuria, or hematuria. Musculoskeletal:  No back pain.  No joint pain.  No muscle tenderness. Extremities:  No pain or swelling. Skin:  No rashes or skin  changes. Neuro:  No headache, numbness or weakness, balance or coordination issues. Endocrine:  Diabetes.  No thyroid issues, hot flashes or night sweats. Psych:  No mood changes, depression or anxiety. Pain:  No focal pain. Review of systems:  All other systems reviewed and found to be negative.  Physical Exam: Blood pressure (!) 145/81, pulse 73, temperature 97.1 F (36.2 C), temperature source Tympanic, resp. rate 18, weight 242 lb 8.1 oz (110 kg). GENERAL:  Well developed, well nourished, woman sitting comfortably in the exam room in no acute distress. MENTAL STATUS:  Alert and oriented to person, place and time. HEAD:  Short curly red hair.  Normocephalic, atraumatic, face symmetric, no Cushingoid features. EYES:  Blue eyes.  Pupils equal round and reactive to light and accomodation.  No conjunctivitis or scleral icterus. ENT:  Oropharynx clear without lesion.  Tongue normal. Mucous membranes moist.  RESPIRATORY:  Clear to auscultation without rales, wheezes or rhonchi. CARDIOVASCULAR:  Regular rate and rhythm without murmur, rub or gallop. ABDOMEN:  Soft, non-tender, with active bowel sounds, and no appreciable hepatosplenomegaly.  No masses. SKIN:  No rashes, ulcers or lesions. EXTREMITIES: No edema, no skin discoloration or tenderness.  No palpable cords. LYMPH NODES: No palpable cervical, supraclavicular, axillary or inguinal adenopathy  NEUROLOGICAL: Unremarkable. PSYCH:  Appropriate.   Appointment on 01/02/2016  Component Date Value Ref Range Status  . WBC 01/02/2016 7.3  3.6 - 11.0 K/uL Final  . RBC 01/02/2016 4.50  3.80 - 5.20 MIL/uL Final  . Hemoglobin 01/02/2016 13.1  12.0 - 16.0 g/dL Final  . HCT 01/02/2016 38.2  35.0 - 47.0 % Final  . MCV 01/02/2016 84.9  80.0 - 100.0 fL Final  . MCH 01/02/2016 29.1  26.0 - 34.0 pg Final  . MCHC 01/02/2016 34.3  32.0 - 36.0 g/dL Final  . RDW 01/02/2016 14.3  11.5 - 14.5 % Final  . Platelets 01/02/2016 174  150 - 440 K/uL Final  .  Neutrophils Relative % 01/02/2016 58  % Final  . Neutro Abs 01/02/2016 4.3  1.4 - 6.5 K/uL Final  . Lymphocytes Relative 01/02/2016 31  % Final  . Lymphs Abs 01/02/2016 2.2  1.0 - 3.6 K/uL Final  . Monocytes Relative 01/02/2016 9  % Final  . Monocytes Absolute 01/02/2016 0.7  0.2 - 0.9 K/uL Final  . Eosinophils Relative 01/02/2016 1  % Final  . Eosinophils Absolute 01/02/2016 0.1  0 - 0.7 K/uL Final  . Basophils Relative 01/02/2016 1  % Final  . Basophils Absolute 01/02/2016 0.0  0 - 0.1 K/uL Final  . Sodium 01/02/2016 135  135 - 145 mmol/L Final  . Potassium 01/02/2016 4.5  3.5 - 5.1 mmol/L Final  . Chloride 01/02/2016 100*  101 - 111 mmol/L Final  . CO2 01/02/2016 28  22 - 32 mmol/L Final  . Glucose, Bld 01/02/2016 114* 65 - 99 mg/dL Final  . BUN 01/02/2016 12  6 - 20 mg/dL Final  . Creatinine, Ser 01/02/2016 0.78  0.44 - 1.00 mg/dL Final  . Calcium 01/02/2016 9.0  8.9 - 10.3 mg/dL Final  . Total Protein 01/02/2016 7.2  6.5 - 8.1 g/dL Final  . Albumin 01/02/2016 3.9  3.5 - 5.0 g/dL Final  . AST 01/02/2016 27  15 - 41 U/L Final  . ALT 01/02/2016 33  14 - 54 U/L Final  . Alkaline Phosphatase 01/02/2016 100  38 - 126 U/L Final  . Total Bilirubin 01/02/2016 0.7  0.3 - 1.2 mg/dL Final  . GFR calc non Af Amer 01/02/2016 >60  >60 mL/min Final  . GFR calc Af Amer 01/02/2016 >60  >60 mL/min Final   Comment: (NOTE) The eGFR has been calculated using the CKD EPI equation. This calculation has not been validated in all clinical situations. eGFR's persistently <60 mL/min signify possible Chronic Kidney Disease.   . Anion gap 01/02/2016 7  5 - 15 Final    Assessment:  CARLESHA SEIPLE is a 60 y.o. female with portal vein thrombosis presenting with fever, chills, dehydration, weight loss, and abdominal pain.  Symptoms lasted for about 4 weeks.  Abdominal and pelvic CT on 11/07/2014 revealed a partially thrombosed portal veins (left > right).  The main portal vein and mesenteric veins remained  patent.  Porta hepatis nodes measured 1.2 cm in short axis (stable).  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  The following studies were normal:  prothrombin gene mutation, lupus anticoagulant panel, anticardiolipin antibodies, homocysteine, protein C antigen (88%), protein C activity (111%), protein S antigen total (131%), protein S antigen free (108%), anti-thrombin III antigen (88%), anti-thrombin III functional (101%).  On 12/11/2014, protein S antigen total (95%), protein S antigen free (77%), and protein S activity (69%) were normal.  PNH by flow cytometry was negative.  JAK2 V617F and exon 12 were negative on 05/02/2015.  Additional testing on 11/27/2014 revealed resolution of anemia.  The following studies were normal:  SPEP, free light chain ratio, 24 hour UPEP, IgG, IgA, ANA, ESR, ferritin, reticulocyte count, B12, and folate.  Serum protein electrophoresis revealed no monoclonal protein.  Kappa free light chains were 26.67, lambda free light chains 24.67 and ratio 1.08 (normal).   IgM was 247 (26-217).    She was started on Xarelto on 11/08/2014.  Plan is for long term anticoagulation given the unprovoked thrombus.  Ultrasound hepatic doppler on 05/02/2015 revealed no convincing evidence of occlusion or thrombus.  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 There was interval reconstitution of previously noted thrombosis within the peripheral branches of the right portal vein. The right and main portal veins appeared widely patent.  A discrete hepatic lesion/mass was not identified.  There was no evidence of cirrhosis. There was unchanged borderline splenomegaly without evidence of gastroesophageal varices or ascites.  There was extensive colonic diverticulosis.  She denies any prior history of thrombosis.  She has not been on birth control pills.  She denies any family history  of thrombosis.   Screening studies for malignancy were normal.  Chest x-ray on 11/05/2014 was negative.  Bilateral mammogram in 11/14/2015 with follow-up diagnostic left mammogram 11/25/2015 revealed no evidence of malignancy.  Colonoscopy on  06/07/2013 revealed only diverticulosis.    Symptomatically, she feels denies any complaint.  Exam is unremarkable.  Plan: 1.  Labs today:  CBC with diff, CMP. 2.  Continue Xarelto 20 mg a day. 3.  RTC in 4 months for MD assessment and labs (CBC with diff, CMP).   Lequita Asal, MD  01/02/2016, 10:15 AM

## 2016-01-02 NOTE — Progress Notes (Signed)
PCP told her not to take Ibuprofen while taking Xarelto.  Wants clarification as to whether she can continue ibuprofen.

## 2016-01-05 ENCOUNTER — Encounter: Payer: Self-pay | Admitting: Hematology and Oncology

## 2016-01-05 DIAGNOSIS — Z7901 Long term (current) use of anticoagulants: Secondary | ICD-10-CM | POA: Insufficient documentation

## 2016-01-18 IMAGING — CT CT ABD-PELV W/ CM
2 of 5 series · 14 of 46 positions shown, 16 images · IV contrast (omnipaque)
Comparison: CT Abdomen and Pelvis 10/10/2014.

CLINICAL DATA: 59-year-old female with multiple urinary tract
infections this month. Nausea, abdominal pain, gross hematuria,
unintentional weight loss. Subsequent encounter.

EXAM:
CT ABDOMEN AND PELVIS WITH CONTRAST
TECHNIQUE: Multidetector CT imaging of the abdomen and pelvis was performed
using the standard protocol following bolus administration of
intravenous contrast.
CONTRAST:  100mL OMNIPAQUE IOHEXOL 350 MG/ML SOLN

[Series 2: routine with · axial · 0.77mm/px · z∈[-1038,-588]mm · 11 of 104 slices shown, 13 images]
[im 7/104  soft-tissue]
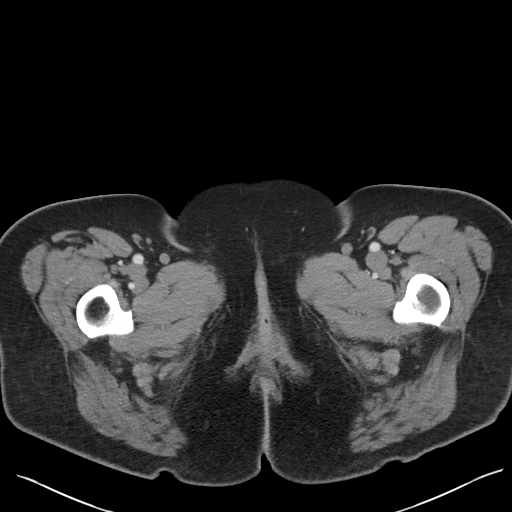
[im 7/104  bone]
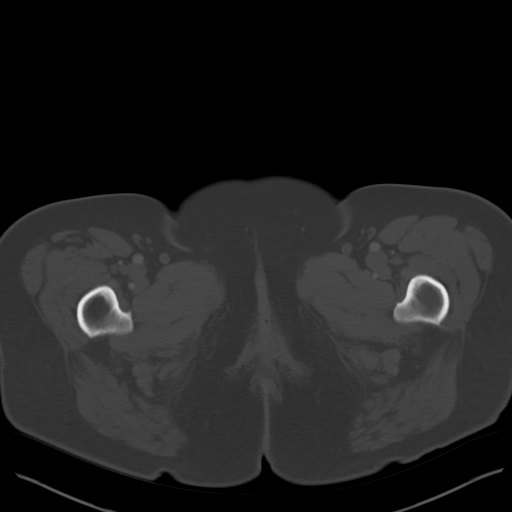
[im 19/104  soft-tissue]
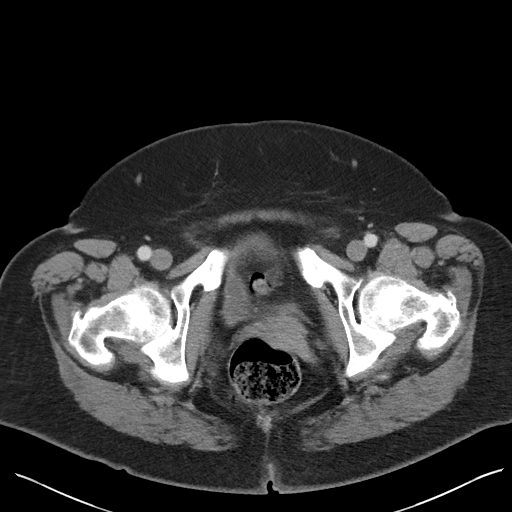
[im 25/104  soft-tissue]
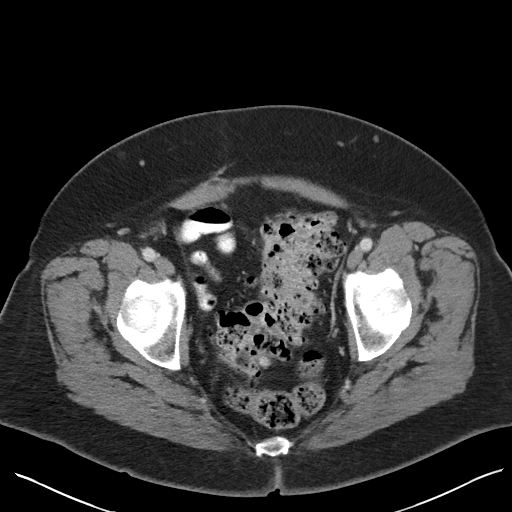
[im 37/104  soft-tissue]
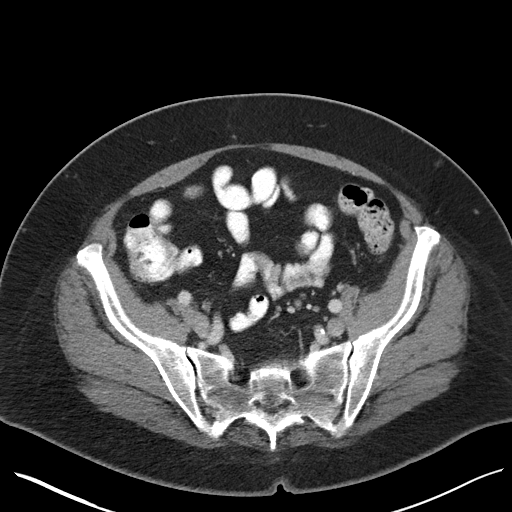
[im 43/104  soft-tissue]
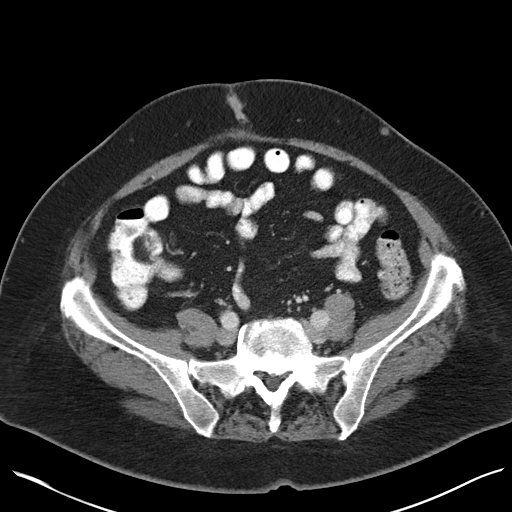
[im 55/104  soft-tissue]
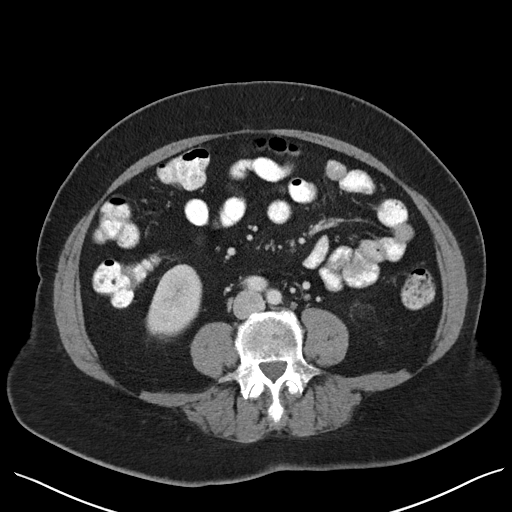
[im 61/104  soft-tissue]
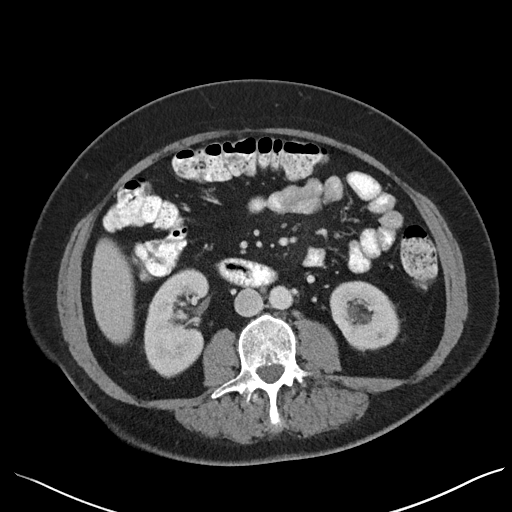
[im 67/104  soft-tissue]
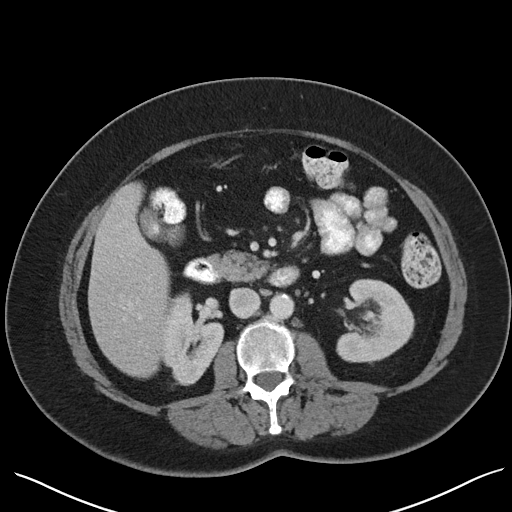
[im 79/104  soft-tissue]
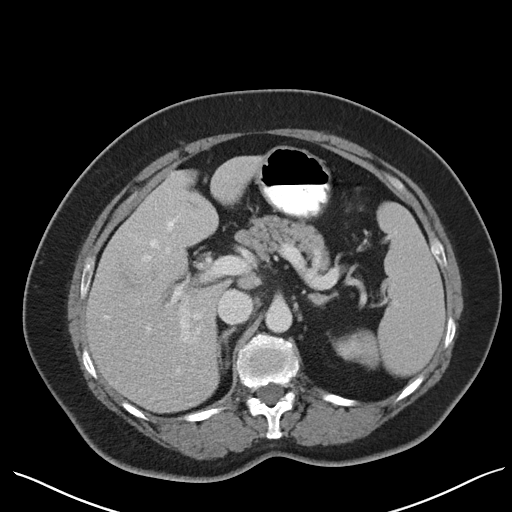
[im 79/104  bone]
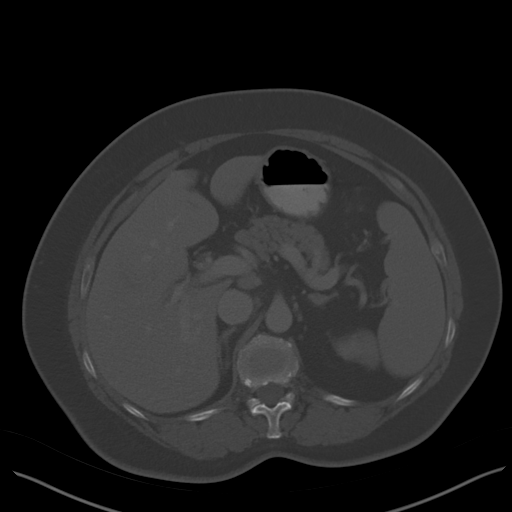
[im 85/104  soft-tissue]
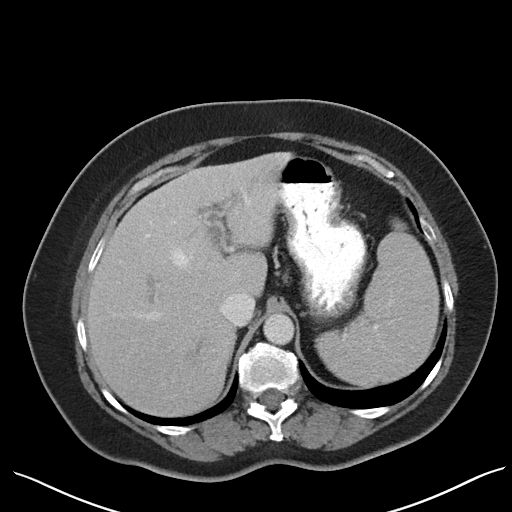
[im 97/104  soft-tissue]
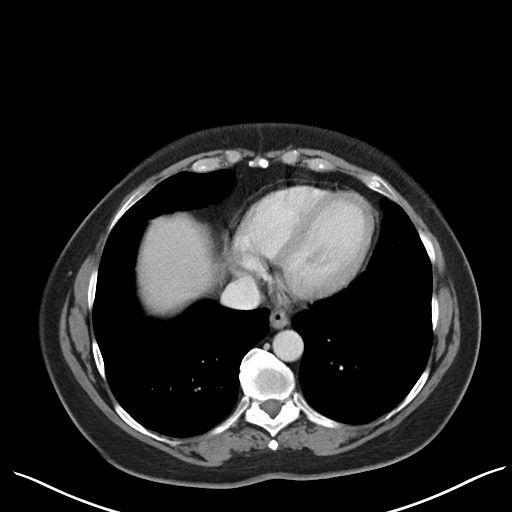

[Series 6: cor routine with · coronal · 0.72mm/px · 3 of 162 slices shown]
[im 54/162  soft-tissue]
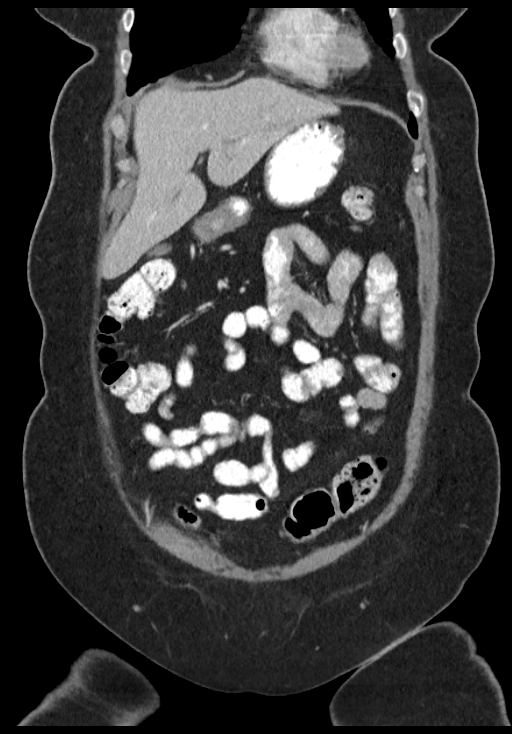
[im 72/162  soft-tissue]
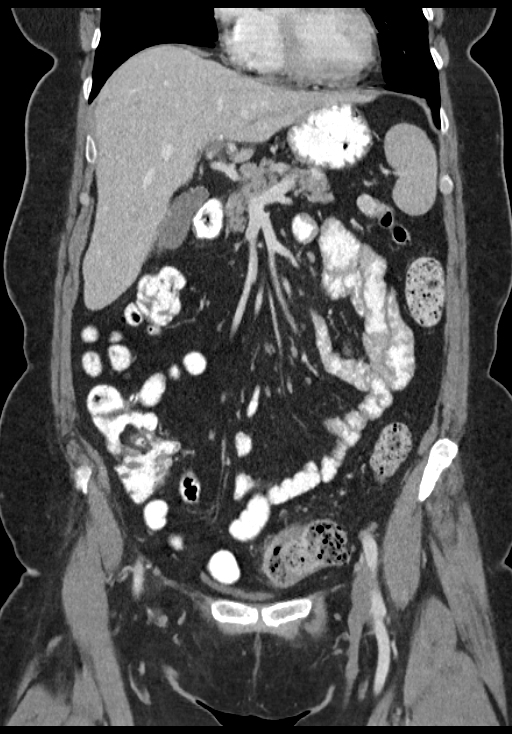
[im 90/162  soft-tissue]
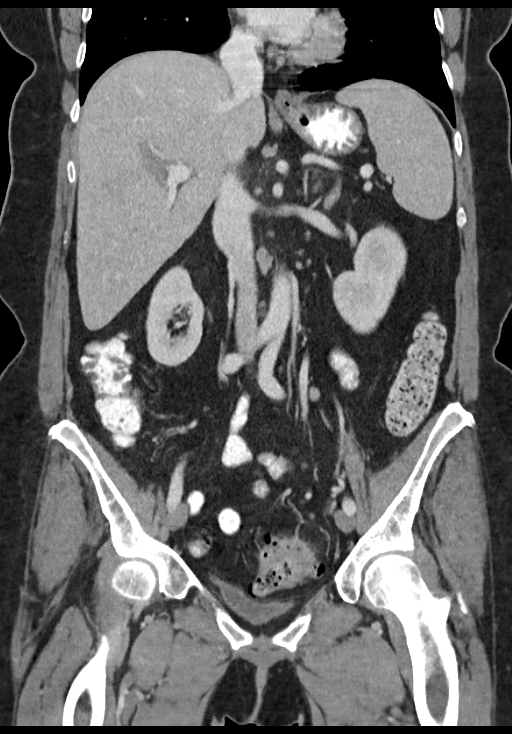

[14 of 46 positions shown; findings below may reference images not displayed]

FINDINGS: Negative lung bases. Tiny subpleural calcified granuloma in the
right lower lobe on series 4, image 7 is unchanged. No pericardial
or pleural effusion.

Degenerative disc, endplate, and posterior element degeneration at
the lumbosacral junction. No acute osseous abnormality identified.

Decompressed bladder. No pelvic free fluid. Uterus appears to be
surgically absent. Negative non contrast vaginal cuff. Diminutive or
absent adnexa.

Retained stool throughout the distal colon. Moderate to severe
sigmoid diverticulosis with no active inflammation identified. There
is a nearly 2 cm benign appearing lipoma of the sigmoid colon re-
demonstrated on series 2, image 78. Stool mixed with contrast in the
descending colon which otherwise is negative. Contrast in the
transverse colon and right colon. Negative cecum and terminal ileum.
No dilated or inflamed small bowel loops. Negative stomach and
duodenum.

Subtle 6 mm low-density area in the anterior superior spleen is
nonspecific. The spleen otherwise appears within normal limits.
Negative pancreas and adrenal glands. Major arterial structures in
the abdomen and pelvis are patent. No free air. Renal contrast
enhancement and excretion is symmetric and within normal limits.
Small parapelvic cyst. No hydronephrosis or hydroureter. No
perinephric stranding or periureteral stranding. No lymphadenopathy.

The gallbladder appears within normal limits but there is
heterogeneous enhancement of the liver, with thrombosis of the left
portal vein, and branches of the right portal vein (series 2, images
21 and 24). Superior branch right portal vein thrombosis may also be
the explanation for patchy heterogeneous hypoenhancement Involving a
4-5 cm area of the posterior right dome on series 2, image 15. Liver
contour appears within normal limits. Maximal porta hepatis nodes
measure 12 mm in short axis and are stable. No perihepatic free
fluid. Main portal vein, splenic vein, and mesenteric veins are
patent.

There is mild postoperative scarring to the ventral abdominal wall
extending from the umbilicus inferiorly. There are several small
subcutaneous nodules up to 9 mm (e.g. Series 2, image 79) in the
ventral lower abdominal wall which were not present on 10/10/2014.
These are nonspecific.
IMPRESSION: 1. Partially thrombosed portal veins, more so the left. The main
portal vein and mesenteric veins remain patent. Explanation for
thrombosis not identified. Maximal porta hepatis lymph nodes. Is
there a history of chronic liver disease? Is the patient
hypercoagulable?
2. No free fluid or other acute finding within the abdomen or
pelvis.
3. New small (9 mm) ventral abdominal subcutaneous soft tissue
nodules (e.g. Series 2, image 79), nonspecific.
4. Diverticulosis and benign appearing lipoma of the sigmoid colon.

## 2016-02-03 ENCOUNTER — Other Ambulatory Visit: Payer: Self-pay | Admitting: Family Medicine

## 2016-02-03 ENCOUNTER — Ambulatory Visit (INDEPENDENT_AMBULATORY_CARE_PROVIDER_SITE_OTHER): Payer: BC Managed Care – PPO | Admitting: Family Medicine

## 2016-02-03 ENCOUNTER — Encounter: Payer: Self-pay | Admitting: Family Medicine

## 2016-02-03 VITALS — BP 138/78 | HR 91 | Temp 97.4°F | Resp 16 | Ht 68.5 in | Wt 243.0 lb

## 2016-02-03 DIAGNOSIS — Z01419 Encounter for gynecological examination (general) (routine) without abnormal findings: Secondary | ICD-10-CM

## 2016-02-03 DIAGNOSIS — Z124 Encounter for screening for malignant neoplasm of cervix: Secondary | ICD-10-CM

## 2016-02-03 DIAGNOSIS — Z23 Encounter for immunization: Secondary | ICD-10-CM

## 2016-02-03 NOTE — Progress Notes (Signed)
Name: Emily Pitts   MRN: [id number]    DOB: 1955/04/21   Date:02/03/2016       Progress Note  Subjective  Chief Complaint  Chief Complaint  Patient presents with  . Annual Exam    HPI  Well Woman: she is not sexually active, no post-menopausal bleeding, but has noticed some stress incontinence. Discussed Kegel exercises.    Patient Active Problem List   Diagnosis Date Noted  . Long term current use of anticoagulant therapy 01/05/2016  . Factor 5 Leiden mutation, heterozygous ([place]) 05/02/2015  . Portal vein thrombosis 11/07/2014  . History of sepsis 10/10/2014  . Abnormal electrocardiogram 10/08/2014  . Nonspecific abnormal electromyogram (EMG) 10/08/2014  . Benign essential HTN 10/08/2014  . Diabetes mellitus with renal manifestation ([place]) 10/08/2014  . Dyslipidemia 10/08/2014  . LBP (low back pain) 10/08/2014  . Gastro-esophageal reflux disease without esophagitis 10/08/2014  . Microalbuminuria 10/08/2014  . Disturbance of skin sensation 10/08/2014  . Obesity (BMI 30-39.9) 10/08/2014  . Perennial allergic rhinitis with seasonal variation 10/08/2014  . Plantar fasciitis 10/08/2014  . Postablative ovarian failure 10/08/2014  . Menopausal symptom 10/08/2014  . Vitamin D deficiency 10/08/2014    Past Surgical History:  Procedure Laterality Date  . ABDOMINAL HYSTERECTOMY  2010  . APPENDECTOMY  1966  . COLONOSCOPY  06-07-13   Dr Bary Castilla  . DILATION AND CURETTAGE OF UTERUS      Family History  Problem Relation Age of Onset  . Diabetes Mother   . COPD Mother   . Diabetes Father   . Hypertension Father   . Cancer Father     Kidney and Prostate  . CAD Father   . Diabetes Brother     [employer]  . Kidney disease Brother     Tumor removed  . Breast cancer Neg Hx     Social History   Social History  . Marital status: Single    Spouse name: N/A  . Number of children: N/A  . Years of education: N/A   Occupational History  . Not on file.   Social History  Main Topics  . Smoking status: Never Smoker  . Smokeless tobacco: Never Used  . Alcohol use No  . Drug use: No  . Sexual activity: No   Other Topics Concern  . Not on file   Social History Narrative  . No narrative on file     Current Outpatient Prescriptions:  .  aspirin EC 81 MG tablet, Take 81 mg by mouth daily., Disp: , Rfl:  .  atorvastatin (LIPITOR) 40 MG tablet, Take 1 tablet (40 mg total) by mouth daily., Disp: 90 tablet, Rfl: 1 .  chlorzoxazone (PARAFON) 500 MG tablet, TAKE ONE TABLET BY MOUTH THREE TIMES DAILY, Disp: 90 tablet, Rfl: 0 .  cholecalciferol (VITAMIN D) 1000 units tablet, Take 1,000 Units by mouth daily., Disp: , Rfl:  .  fluticasone (FLONASE) 50 MCG/ACT nasal spray, Place 2 sprays into both nostrils as needed., Disp: , Rfl:  .  glucose blood (ONE TOUCH ULTRA TEST) test strip, Use as instructed, Disp: 100 each, Rfl: 12 .  levocetirizine (XYZAL) 5 MG tablet, Take 1 tablet (5 mg total) by mouth daily., Disp: 90 tablet, Rfl: 1 .  losartan-hydrochlorothiazide (HYZAAR) 100-12.5 MG tablet, Take 1 tablet by mouth daily., Disp: 90 tablet, Rfl: 1 .  magnesium oxide (MAG-OX) 400 MG tablet, Take 400 mg by mouth daily., Disp: , Rfl:  .  montelukast (SINGULAIR) 10 MG tablet, Take 1 tablet (  10 mg total) by mouth at bedtime., Disp: 90 tablet, Rfl: 1 .  rivaroxaban (XARELTO) 20 MG TABS tablet, Take 1 tablet (20 mg total) by mouth daily with supper., Disp: 30 tablet, Rfl: 5 .  vitamin B-12 (CYANOCOBALAMIN) 1000 MCG tablet, Take 1,000 mcg by mouth daily., Disp: , Rfl:  .  vitamin C (ASCORBIC ACID) 500 MG tablet, Take 500 mg by mouth daily., Disp: , Rfl:   No Known Allergies   ROS  Constitutional: Negative for fever or weight change.  Respiratory: Negative for cough and shortness of breath.   Cardiovascular: Negative for chest pain or palpitations.  Gastrointestinal: Negative for abdominal pain, no bowel changes.  Musculoskeletal: Negative for gait problem or joint swelling.   Skin: Negative for rash.  Neurological: Negative for dizziness or headache.  No other specific complaints in a complete review of systems (except as listed in HPI above).  Objective  Vitals:   02/03/16 0828 02/03/16 1025  BP: (!) 146/88 138/78  Pulse: 91   Resp: 16   Temp: 97.4 F (36.3 C)   TempSrc: Oral   SpO2: 97%   Weight: 243 lb (110.2 kg)   Height: 5' 8.5" (1.74 m)     Body mass index is 36.41 kg/m.  Physical Exam  Constitutional: Patient appears well-developed and obese. No distress.  HENT: Head: Normocephalic and atraumatic. Ears: B TMs ok, no erythema or effusion; Nose: Nose normal. Mouth/Throat: Oropharynx is clear and moist. No oropharyngeal exudate.  Eyes: Conjunctivae and EOM are normal. Pupils are equal, round, and reactive to light. No scleral icterus.  Neck: Normal range of motion. Neck supple. No JVD present. No thyromegaly present.  Cardiovascular: Normal rate, regular rhythm and normal heart sounds.  No murmur heard. 1 plus BLE edema. Pulmonary/Chest: Effort normal and breath sounds normal. No respiratory distress. Abdominal: Soft. Bowel sounds are normal, no distension. There is no tenderness. no masses Breast: no lumps or masses, no nipple discharge or rashes FEMALE GENITALIA:  External genitalia normal External urethra normal Vaginal vault normal without discharge or lesions Cervix normal without discharge or lesions Bimanual exam normal without masses RECTAL: not done Musculoskeletal: Normal range of motion, no joint effusions. No gross deformities Neurological: he is alert and oriented to person, place, and time. No cranial nerve deficit. Coordination, balance, strength, speech and gait are normal.  Skin: Skin is warm and dry. No rash noted. No erythema.  Psychiatric: Patient has a normal mood and affect. behavior is normal. Judgment and thought content normal.  Recent Results (from the past 2160 hour(s))  CBC with Differential/Platelet      Status: None   Collection Time: 01/02/16  8:36 AM  Result Value Ref Range   WBC 7.3 3.6 - 11.0 K/uL   RBC 4.50 3.80 - 5.20 MIL/uL   Hemoglobin 13.1 12.0 - 16.0 g/dL   HCT 38.2 35.0 - 47.0 %   MCV 84.9 80.0 - 100.0 fL   MCH 29.1 26.0 - 34.0 pg   MCHC 34.3 32.0 - 36.0 g/dL   RDW 14.3 11.5 - 14.5 %   Platelets 174 150 - 440 K/uL   Neutrophils Relative % 58 %   Neutro Abs 4.3 1.4 - 6.5 K/uL   Lymphocytes Relative 31 %   Lymphs Abs 2.2 1.0 - 3.6 K/uL   Monocytes Relative 9 %   Monocytes Absolute 0.7 0.2 - 0.9 K/uL   Eosinophils Relative 1 %   Eosinophils Absolute 0.1 0 - 0.7 K/uL   Basophils Relative 1 %  Basophils Absolute 0.0 0 - 0.1 K/uL  Comprehensive metabolic panel     Status: Abnormal   Collection Time: 01/02/16  8:36 AM  Result Value Ref Range   Sodium 135 135 - 145 mmol/L   Potassium 4.5 3.5 - 5.1 mmol/L   Chloride 100 (L) 101 - 111 mmol/L   CO2 28 22 - 32 mmol/L   Glucose, Bld 114 (H) 65 - 99 mg/dL   BUN 12 6 - 20 mg/dL   Creatinine, Ser 0.78 0.44 - 1.00 mg/dL   Calcium 9.0 8.9 - 10.3 mg/dL   Total Protein 7.2 6.5 - 8.1 g/dL   Albumin 3.9 3.5 - 5.0 g/dL   AST 27 15 - 41 U/L   ALT 33 14 - 54 U/L   Alkaline Phosphatase 100 38 - 126 U/L   Total Bilirubin 0.7 0.3 - 1.2 mg/dL   GFR calc non Af Amer >60 >60 mL/min   GFR calc Af Amer >60 >60 mL/min    Comment: (NOTE) The eGFR has been calculated using the CKD EPI equation. This calculation has not been validated in all clinical situations. eGFR's persistently <60 mL/min signify possible Chronic Kidney Disease.    Anion gap 7 5 - 15    Diabetic Foot Exam: Diabetic Foot Exam - Simple   Simple Foot Form Diabetic Foot exam was performed with the following findings:  Yes 02/03/2016 10:30 AM  Visual Inspection No deformities, no ulcerations, no other skin breakdown bilaterally:  Yes Sensation Testing Intact to touch and monofilament testing bilaterally:  Yes Pulse Check Posterior Tibialis and Dorsalis pulse intact  bilaterally:  Yes Comments      PHQ2/9: Depression screen [facility] 2/9 02/03/2016 10/30/2015 08/15/2015 05/02/2015 10/08/2014  Decreased Interest 0 0 0 0 0  Down, Depressed, Hopeless 0 0 0 0 0  PHQ - 2 Score 0 0 0 0 0     Fall Risk: Fall Risk  02/03/2016 10/30/2015 08/15/2015 05/02/2015 10/08/2014  Falls in the past year? No No Yes Yes No  Number falls in past yr: - - 1 1 -  Injury with Fall? - - No No -      Functional Status Survey: Is the patient deaf or have difficulty hearing?: No Does the patient have difficulty seeing, even when wearing glasses/contacts?: No Does the patient have difficulty concentrating, remembering, or making decisions?: No Does the patient have difficulty walking or climbing stairs?: No Does the patient have difficulty dressing or bathing?: No Does the patient have difficulty doing errands alone such as visiting a doctor's office or shopping?: No    Assessment & Plan  1. Well woman exam  Discussed importance of 150 minutes of physical activity weekly, eat two servings of fish weekly, eat one serving of tree nuts ( cashews, pistachios, pecans, almonds.Marland Kitchen) every other day, eat 6 servings of fruit/vegetables daily and drink plenty of water and avoid sweet beverages.   2. Cervical cancer screening  -pap smear  3. Need for pneumococcal vaccination  -PCV 13

## 2016-02-05 LAB — PAP IG AND HPV HIGH-RISK: HPV DNA High Risk: NOT DETECTED

## 2016-02-21 ENCOUNTER — Other Ambulatory Visit: Payer: Self-pay | Admitting: Family Medicine

## 2016-02-21 DIAGNOSIS — J3089 Other allergic rhinitis: Principal | ICD-10-CM

## 2016-02-21 DIAGNOSIS — J302 Other seasonal allergic rhinitis: Secondary | ICD-10-CM

## 2016-03-25 ENCOUNTER — Other Ambulatory Visit: Payer: Self-pay | Admitting: Hematology and Oncology

## 2016-03-25 DIAGNOSIS — D6851 Activated protein C resistance: Secondary | ICD-10-CM

## 2016-03-25 DIAGNOSIS — I81 Portal vein thrombosis: Secondary | ICD-10-CM

## 2016-04-30 ENCOUNTER — Inpatient Hospital Stay (HOSPITAL_BASED_OUTPATIENT_CLINIC_OR_DEPARTMENT_OTHER): Payer: BC Managed Care – PPO | Admitting: Hematology and Oncology

## 2016-04-30 ENCOUNTER — Ambulatory Visit (INDEPENDENT_AMBULATORY_CARE_PROVIDER_SITE_OTHER): Payer: BC Managed Care – PPO | Admitting: Family Medicine

## 2016-04-30 ENCOUNTER — Inpatient Hospital Stay: Payer: BC Managed Care – PPO | Attending: Hematology and Oncology

## 2016-04-30 VITALS — BP 124/60 | HR 91 | Temp 98.4°F | Resp 16 | Ht 69.0 in | Wt 237.4 lb

## 2016-04-30 VITALS — BP 131/81 | HR 81 | Temp 97.6°F | Resp 18 | Wt 237.0 lb

## 2016-04-30 DIAGNOSIS — J302 Other seasonal allergic rhinitis: Secondary | ICD-10-CM

## 2016-04-30 DIAGNOSIS — I81 Portal vein thrombosis: Secondary | ICD-10-CM | POA: Diagnosis not present

## 2016-04-30 DIAGNOSIS — Z7901 Long term (current) use of anticoagulants: Secondary | ICD-10-CM

## 2016-04-30 DIAGNOSIS — E785 Hyperlipidemia, unspecified: Secondary | ICD-10-CM | POA: Insufficient documentation

## 2016-04-30 DIAGNOSIS — D6851 Activated protein C resistance: Secondary | ICD-10-CM | POA: Insufficient documentation

## 2016-04-30 DIAGNOSIS — E559 Vitamin D deficiency, unspecified: Secondary | ICD-10-CM

## 2016-04-30 DIAGNOSIS — Z7982 Long term (current) use of aspirin: Secondary | ICD-10-CM | POA: Diagnosis not present

## 2016-04-30 DIAGNOSIS — R809 Proteinuria, unspecified: Secondary | ICD-10-CM | POA: Diagnosis not present

## 2016-04-30 DIAGNOSIS — E119 Type 2 diabetes mellitus without complications: Secondary | ICD-10-CM | POA: Insufficient documentation

## 2016-04-30 DIAGNOSIS — R161 Splenomegaly, not elsewhere classified: Secondary | ICD-10-CM | POA: Diagnosis not present

## 2016-04-30 DIAGNOSIS — J3089 Other allergic rhinitis: Secondary | ICD-10-CM | POA: Diagnosis not present

## 2016-04-30 DIAGNOSIS — I10 Essential (primary) hypertension: Secondary | ICD-10-CM | POA: Diagnosis not present

## 2016-04-30 DIAGNOSIS — E1129 Type 2 diabetes mellitus with other diabetic kidney complication: Secondary | ICD-10-CM | POA: Diagnosis not present

## 2016-04-30 DIAGNOSIS — Z7984 Long term (current) use of oral hypoglycemic drugs: Secondary | ICD-10-CM | POA: Insufficient documentation

## 2016-04-30 LAB — CBC WITH DIFFERENTIAL/PLATELET
Basophils Absolute: 0 10*3/uL (ref 0–0.1)
Basophils Relative: 0 %
Eosinophils Absolute: 0.1 10*3/uL (ref 0–0.7)
Eosinophils Relative: 1 %
HCT: 39.4 % (ref 35.0–47.0)
Hemoglobin: 13.5 g/dL (ref 12.0–16.0)
Lymphocytes Relative: 27 %
Lymphs Abs: 2.2 10*3/uL (ref 1.0–3.6)
MCH: 29 pg (ref 26.0–34.0)
MCHC: 34.2 g/dL (ref 32.0–36.0)
MCV: 84.8 fL (ref 80.0–100.0)
Monocytes Absolute: 0.7 10*3/uL (ref 0.2–0.9)
Monocytes Relative: 9 %
Neutro Abs: 5.3 10*3/uL (ref 1.4–6.5)
Neutrophils Relative %: 63 %
Platelets: 193 10*3/uL (ref 150–440)
RBC: 4.65 MIL/uL (ref 3.80–5.20)
RDW: 14.6 % — ABNORMAL HIGH (ref 11.5–14.5)
WBC: 8.3 10*3/uL (ref 3.6–11.0)

## 2016-04-30 LAB — COMPLETE METABOLIC PANEL WITH GFR
ALBUMIN: 4.1 g/dL (ref 3.6–5.1)
ALK PHOS: 100 U/L (ref 33–130)
ALT: 30 U/L — ABNORMAL HIGH (ref 6–29)
AST: 22 U/L (ref 10–35)
BUN: 11 mg/dL (ref 7–25)
CALCIUM: 9.7 mg/dL (ref 8.6–10.4)
CHLORIDE: 103 mmol/L (ref 98–110)
CO2: 29 mmol/L (ref 20–31)
Creat: 0.78 mg/dL (ref 0.50–0.99)
GFR, EST NON AFRICAN AMERICAN: 82 mL/min (ref >=60)
GFR, Est African American: >89 mL/min (ref >=60)
GLUCOSE: 130 mg/dL — AB (ref 65–99)
POTASSIUM: 4.6 mmol/L (ref 3.5–5.3)
SODIUM: 141 mmol/L (ref 135–146)
Total Bilirubin: 0.8 mg/dL (ref 0.2–1.2)
Total Protein: 7.4 g/dL (ref 6.1–8.1)

## 2016-04-30 LAB — COMPREHENSIVE METABOLIC PANEL
ALT: 33 U/L (ref 14–54)
AST: 27 U/L (ref 15–41)
Albumin: 4.2 g/dL (ref 3.5–5.0)
Alkaline Phosphatase: 96 U/L (ref 38–126)
Anion gap: 7 (ref 5–15)
BUN: 12 mg/dL (ref 6–20)
CO2: 28 mmol/L (ref 22–32)
Calcium: 9.3 mg/dL (ref 8.9–10.3)
Chloride: 102 mmol/L (ref 101–111)
Creatinine, Ser: 0.71 mg/dL (ref 0.44–1.00)
GFR calc Af Amer: >60 mL/min (ref >60)
GFR calc non Af Amer: >60 mL/min (ref >60)
Glucose, Bld: 130 mg/dL — ABNORMAL HIGH (ref 65–99)
Potassium: 4.3 mmol/L (ref 3.5–5.1)
Sodium: 137 mmol/L (ref 135–145)
Total Bilirubin: 1 mg/dL (ref 0.3–1.2)
Total Protein: 7.7 g/dL (ref 6.5–8.1)

## 2016-04-30 LAB — POCT UA - MICROALBUMIN: Microalbumin Ur, POC: 0 mg/L

## 2016-04-30 LAB — POCT GLYCOSYLATED HEMOGLOBIN (HGB A1C): Hemoglobin A1C: 6.9

## 2016-04-30 LAB — LIPID PANEL
CHOL/HDL RATIO: 2.2 ratio (ref <5.0)
Cholesterol: 123 mg/dL (ref <200)
HDL: 55 mg/dL (ref >50)
LDL Cholesterol: 47 mg/dL (ref <100)
Triglycerides: 104 mg/dL (ref <150)
VLDL: 21 mg/dL (ref <30)

## 2016-04-30 MED ORDER — LOSARTAN POTASSIUM-HCTZ 100-12.5 MG PO TABS: 1.0000 | ORAL_TABLET | Freq: Every day | ORAL | 1 refills | Status: DC

## 2016-04-30 MED ORDER — METFORMIN HCL ER 500 MG PO TB24: 1000.0000 mg | ORAL_TABLET | Freq: Every day | ORAL | 0 refills | Status: DC

## 2016-04-30 MED ORDER — ATORVASTATIN CALCIUM 40 MG PO TABS: 40.0000 mg | ORAL_TABLET | Freq: Every day | ORAL | 1 refills | Status: DC

## 2016-04-30 MED ORDER — LEVOCETIRIZINE DIHYDROCHLORIDE 5 MG PO TABS: 5.0000 mg | ORAL_TABLET | Freq: Every day | ORAL | 1 refills | Status: DC

## 2016-04-30 NOTE — Progress Notes (Signed)
Name: Emily Pitts   MRN: [id number]    DOB: 07/10/1955   Date:04/30/2016       Progress Note  Subjective  Chief Complaint  Chief Complaint  Patient presents with  . Diabetes    3 month follow up avg 126-166 checks twice a week  . Hyperlipidemia  . Hypertension    HPI  Diabetes Type II with microalbuminuria: on diet only and hgbA1C has gone up to 6.9%, she is willing to resume Metformin.  She denies blurred vision,  polyphagia, polyuria or polydipsia. Chronic nocturia once per night - she drinks a lot of water in the evenings. Complaint with diabetic diet. Glucose fasting is around 130's fasting.    Obesity: she is going home after work, eating, watching TV, she has not been falling asleep on her recliner lately. Walking at work not at home.   HTN: she is on Losartan/hctz and bp is at goal.  She denies side effects of medication. No chest pain, SOB or palpitation   Hyperlipidemia: she is now on Atorvastatin and denies side effects. Denies side effects, we will recheck lab work   GERD: she stopped taking Omeprazole, controlled with life style modification, no heartburn or regurgitation  Perennial AR with seasonal variation: Occasional  Sneezing, rhinorrhea and headaches, worse in the Spring and Fall. Still taking Xyzal, and singulair only prn.   Right shoulder pain: she has noticed a gradual and progressively worse pain on right shoulder over one year, went to [facility] and had steroid injection a few months and is getting better . She is not working on her exercise regiment  Portal Vein Thrombosis: seen hematologist, taking Xarelto, denies any bleeding.    Patient Active Problem List   Diagnosis Date Noted  . Long term current use of anticoagulant therapy 01/05/2016  . Factor 5 Leiden mutation, heterozygous ([place]) 05/02/2015  . Portal vein thrombosis 11/07/2014  . History of sepsis 10/10/2014  . Abnormal electrocardiogram 10/08/2014  . Nonspecific abnormal  electromyogram (EMG) 10/08/2014  . Benign essential HTN 10/08/2014  . Diabetes mellitus with renal manifestation ([place]) 10/08/2014  . Dyslipidemia 10/08/2014  . LBP (low back pain) 10/08/2014  . Gastro-esophageal reflux disease without esophagitis 10/08/2014  . Microalbuminuria 10/08/2014  . Disturbance of skin sensation 10/08/2014  . Obesity (BMI 30-39.9) 10/08/2014  . Perennial allergic rhinitis with seasonal variation 10/08/2014  . Plantar fasciitis 10/08/2014  . Postablative ovarian failure 10/08/2014  . Menopausal symptom 10/08/2014  . Vitamin D deficiency 10/08/2014    Past Surgical History:  Procedure Laterality Date  . ABDOMINAL HYSTERECTOMY  2010  . APPENDECTOMY  1966  . COLONOSCOPY  06-07-13   Dr Bary Castilla  . DILATION AND CURETTAGE OF UTERUS      Family History  Problem Relation Age of Onset  . Diabetes Mother   . COPD Mother   . Diabetes Father   . Hypertension Father   . Cancer Father     Kidney and Prostate  . CAD Father   . Diabetes Brother     [employer]  . Kidney disease Brother     Tumor removed  . Breast cancer Neg Hx     Social History   Social History  . Marital status: Single    Spouse name: N/A  . Number of children: N/A  . Years of education: N/A   Occupational History  . Not on file.   Social History Main Topics  . Smoking status: Never Smoker  . Smokeless tobacco: Never Used  .  Alcohol use No  . Drug use: No  . Sexual activity: No   Other Topics Concern  . Not on file   Social History Narrative  . No narrative on file     Current Outpatient Prescriptions:  .  aspirin EC 81 MG tablet, Take 81 mg by mouth daily., Disp: , Rfl:  .  atorvastatin (LIPITOR) 40 MG tablet, Take 1 tablet (40 mg total) by mouth daily., Disp: 90 tablet, Rfl: 1 .  chlorzoxazone (PARAFON) 500 MG tablet, TAKE ONE TABLET BY MOUTH THREE TIMES DAILY, Disp: 90 tablet, Rfl: 0 .  cholecalciferol (VITAMIN D) 1000 units tablet, Take 1,000 Units by mouth daily.,  Disp: , Rfl:  .  fluticasone (FLONASE) 50 MCG/ACT nasal spray, Place 2 sprays into both nostrils as needed., Disp: , Rfl:  .  glucose blood (ONE TOUCH ULTRA TEST) test strip, Use as instructed, Disp: 100 each, Rfl: 12 .  levocetirizine (XYZAL) 5 MG tablet, Take 1 tablet (5 mg total) by mouth daily., Disp: 90 tablet, Rfl: 1 .  losartan-hydrochlorothiazide (HYZAAR) 100-12.5 MG tablet, Take 1 tablet by mouth daily., Disp: 90 tablet, Rfl: 1 .  magnesium oxide (MAG-OX) 400 MG tablet, Take 400 mg by mouth daily., Disp: , Rfl:  .  metFORMIN (GLUCOPHAGE-XR) 500 MG 24 hr tablet, Take 2 tablets (1,000 mg total) by mouth daily with breakfast., Disp: 180 tablet, Rfl: 0 .  montelukast (SINGULAIR) 10 MG tablet, TAKE ONE TABLET BY MOUTH AT BEDTIME, Disp: 90 tablet, Rfl: 1 .  vitamin B-12 (CYANOCOBALAMIN) 1000 MCG tablet, Take 1,000 mcg by mouth daily., Disp: , Rfl:  .  vitamin C (ASCORBIC ACID) 500 MG tablet, Take 500 mg by mouth daily., Disp: , Rfl:  .  XARELTO 20 MG TABS tablet, TAKE ONE TABLET BY MOUTH ONCE DAILY WITH  SUPPER, Disp: 30 tablet, Rfl: 5  No Known Allergies   ROS  Constitutional: Negative for fever, positive for weight change.  Respiratory: Negative for cough and shortness of breath.   Cardiovascular: Negative for chest pain or palpitations.  Gastrointestinal: Negative for abdominal pain, no bowel changes.  Musculoskeletal: Negative for gait problem or joint swelling.  Skin: Negative for rash.  Neurological: Negative for dizziness or headache.  No other specific complaints in a complete review of systems (except as listed in HPI above).  Objective  Vitals:   04/30/16 0816  BP: 124/60  Pulse: 91  Resp: 16  Temp: 98.4 F (36.9 C)  SpO2: 95%  Weight: 237 lb 6 oz (107.7 kg)  Height: 5\' 9"  (1.753 m)    Body mass index is 35.05 kg/m.  Physical Exam  Constitutional: Patient appears well-developed and well-nourished. Obese  No distress.  HEENT: head atraumatic, normocephalic,  pupils equal and reactive to light,  neck supple, throat within normal limits Cardiovascular: Normal rate, regular rhythm and normal heart sounds.  No murmur heard. No BLE edema. Pulmonary/Chest: Effort normal and breath sounds normal. No respiratory distress. Abdominal: Soft.  There is no tenderness. Psychiatric: Patient has a normal mood and affect. behavior is normal. Judgment and thought content normal.  Recent Results (from the past 2160 hour(s))  Pap IG and HPV (high risk) DNA detection     Status: None   Collection Time: 02/03/16  9:10 AM  Result Value Ref Range   HPV DNA High Risk Not Detected     Comment: HIGH RISK HPV types (16,18,31,33,35,39,45,51,52,56,58,59,66,68) were not detected. Other HPV types which cause anogenital lesions may be present. The significance of the  other types of HPV in malignant  processes has not been established.                  ** Normal Reference Range: Not Detected **      HPV High Risk testing performed using the APTIMA HPV mRNA Assay.      Specimen adequacy:      Comment: SATISFACTORY.  Endocervical/transformation zone component present.   FINAL DIAGNOSIS:      Comment: - NEGATIVE FOR INTRAEPITHELIAL LESIONS OR MALIGNANCY.    COMMENTS:      Comment: This Pap test has been evaluated with computer assisted technology.   Cytotechnologist:      Comment: JWW, BS CT(ASCP), MLT(ASCP) *  The Pap is a screening test for cervical cancer. It is not a  diagnostic test and is subject to false negative and false positive  results. It is most reliable when a satisfactory sample, regularly  obtained, is submitted with relevant clinical findings and history,  and when the Pap result is evaluated along with historic and current  clinical information.   POCT HgB A1C     Status: Abnormal   Collection Time: 04/30/16  8:21 AM  Result Value Ref Range   Hemoglobin A1C 6.9   POCT UA - Microalbumin     Status: Normal   Collection Time: 04/30/16  8:22 AM   Result Value Ref Range   Microalbumin Ur, POC 0 mg/L   Creatinine, POC  mg/dL   Albumin/Creatinine Ratio, Urine, POC       PHQ2/9: Depression screen [facility] 2/9 04/30/2016 02/03/2016 10/30/2015 08/15/2015 05/02/2015  Decreased Interest 0 0 0 0 0  Down, Depressed, Hopeless 0 0 0 0 0  PHQ - 2 Score 0 0 0 0 0     Fall Risk: Fall Risk  04/30/2016 02/03/2016 10/30/2015 08/15/2015 05/02/2015  Falls in the past year? No No No Yes Yes  Number falls in past yr: - - - 1 1  Injury with Fall? - - - No No     Functional Status Survey: Is the patient deaf or have difficulty hearing?: No Does the patient have difficulty seeing, even when wearing glasses/contacts?: No Does the patient have difficulty concentrating, remembering, or making decisions?: No Does the patient have difficulty walking or climbing stairs?: No Does the patient have difficulty dressing or bathing?: No Does the patient have difficulty doing errands alone such as visiting a doctor's office or shopping?: No    Assessment & Plan  1. Type 2 diabetes mellitus with microalbuminuria, without long-term current use of insulin (HCC)  - POCT HgB A1C - going up we will resume Metformin  - POCT UA - Microalbumin - back to normal continue ARB - losartan-hydrochlorothiazide (HYZAAR) 100-12.5 MG tablet; Take 1 tablet by mouth daily.  Dispense: 90 tablet; Refill: 1 - metFORMIN (GLUCOPHAGE-XR) 500 MG 24 hr tablet; Take 2 tablets (1,000 mg total) by mouth daily with breakfast.  Dispense: 180 tablet; Refill: 0  2. Benign essential HTN  - losartan-hydrochlorothiazide (HYZAAR) 100-12.5 MG tablet; Take 1 tablet by mouth daily.  Dispense: 90 tablet; Refill: 1 - COMPLETE METABOLIC PANEL WITH GFR  3. Dyslipidemia  - atorvastatin (LIPITOR) 40 MG tablet; Take 1 tablet (40 mg total) by mouth daily.  Dispense: 90 tablet; Refill: 1 - Lipid panel  4. Portal vein thrombosis  Sees Dr. Mike Gip and is having CBC done today   5. Vitamin D  deficiency  - VITAMIN D 25 Hydroxy (Vit-D Deficiency, Fractures)  6.  Perennial allergic rhinitis with seasonal variation  - levocetirizine (XYZAL) 5 MG tablet; Take 1 tablet (5 mg total) by mouth daily.  Dispense: 90 tablet; Refill: 1

## 2016-04-30 NOTE — Progress Notes (Signed)
Patient offers no complaints today. 

## 2016-04-30 NOTE — Progress Notes (Signed)
[facility] day:  04/30/16   Chief Complaint: Emily Pitts is a 61 y.o. female with Factor V Leiden and portal vein thrombosis who is seen for 4 month assessment on Xarelto.  HPI:   The patient was last seen in the hematology clinic on 01/02/2016.  At that time, she was doing well. Exam was unremarkable.  CBC and CMP were normal.  During the interim, he has continued to do well.  She denies any problems. She denies any bruising or bleeding.  She saw Dr.Sowles this morning.   Past Medical History:  Diagnosis Date  . Allergy   . Diabetes mellitus without complication ([place]) 3790  . Hyperlipidemia   . Hypertension   . Low back pain, episodic   . Numbness of feet   . Vitamin D deficiency     Past Surgical History:  Procedure Laterality Date  . ABDOMINAL HYSTERECTOMY  2010  . APPENDECTOMY  1966  . COLONOSCOPY  06-07-13   Dr Bary Castilla  . DILATION AND CURETTAGE OF UTERUS      Family History  Problem Relation Age of Onset  . Diabetes Mother   . COPD Mother   . Diabetes Father   . Hypertension Father   . Cancer Father     Kidney and Prostate  . CAD Father   . Diabetes Brother     [employer]  . Kidney disease Brother     Tumor removed  . Breast cancer Neg Hx   Her grandfather had a CVA in his 56s.  A niece had an early pregnancy loss.   Social History:  reports that she has never smoked. She has never used smokeless tobacco. She reports that she does not drink alcohol or use drugs.  She does not have any children.  She lives in [place].  The patient is alone today.  Allergies: No Known Allergies  Current Medications: Current Outpatient Prescriptions  Medication Sig Dispense Refill  . aspirin EC 81 MG tablet Take 81 mg by mouth daily.    Marland Kitchen atorvastatin (LIPITOR) 40 MG tablet Take 1 tablet (40 mg total) by mouth daily. 90 tablet 1  . chlorzoxazone (PARAFON) 500 MG tablet TAKE ONE TABLET BY MOUTH THREE TIMES DAILY 90 tablet 0   . cholecalciferol (VITAMIN D) 1000 units tablet Take 1,000 Units by mouth daily.    . fluticasone (FLONASE) 50 MCG/ACT nasal spray Place 2 sprays into both nostrils as needed.    Marland Kitchen glucose blood (ONE TOUCH ULTRA TEST) test strip Use as instructed 100 each 12  . levocetirizine (XYZAL) 5 MG tablet Take 1 tablet (5 mg total) by mouth daily. 90 tablet 1  . losartan-hydrochlorothiazide (HYZAAR) 100-12.5 MG tablet Take 1 tablet by mouth daily. 90 tablet 1  . magnesium oxide (MAG-OX) 400 MG tablet Take 400 mg by mouth daily.    . metFORMIN (GLUCOPHAGE-XR) 500 MG 24 hr tablet Take 2 tablets (1,000 mg total) by mouth daily with breakfast. 180 tablet 0  . montelukast (SINGULAIR) 10 MG tablet TAKE ONE TABLET BY MOUTH AT BEDTIME 90 tablet 1  . vitamin B-12 (CYANOCOBALAMIN) 1000 MCG tablet Take 1,000 mcg by mouth daily.    . vitamin C (ASCORBIC ACID) 500 MG tablet Take 500 mg by mouth daily.    Alveda Reasons 20 MG TABS tablet TAKE ONE TABLET BY MOUTH ONCE DAILY WITH  SUPPER 30 tablet 5   No current facility-administered medications for this visit.  Review of Systems:  GENERAL:  Feels good.  No fevers or sweats.  Weight down 6 pounds since last visit. PERFORMANCE STATUS (ECOG):  0 HEENT:  No visual changes, runny nose, sore throat, mouth sores or tenderness. Lungs: No shortness of breath or cough.  No hemoptysis. Cardiac:  No chest pain, palpitations, orthopnea, or PND.  GI:  No nausea, vomiting, diarrhea, constipation, melena or hematochezia.  Colonoscopy in 06/2013. GU:  No urgency, frequency, dysuria, or hematuria. Musculoskeletal:  No back pain.  No joint pain.  No muscle tenderness. Extremities:  No pain or swelling. Skin:  No rashes or skin changes. Neuro:  No headache, numbness or weakness, balance or coordination issues. Endocrine:  Diabetes.  No thyroid issues, hot flashes or night sweats. Psych:  No mood changes, depression or anxiety. Pain:  No focal pain. Review of systems:  All other  systems reviewed and found to be negative.  Physical Exam: Blood pressure 131/81, pulse 81, temperature 97.6 F (36.4 C), temperature source Tympanic, resp. rate 18, weight 236 lb 15.9 oz (107.5 kg). GENERAL:  Well developed, well nourished, woman sitting comfortably in the exam room in no acute distress. MENTAL STATUS:  Alert and oriented to person, place and time. HEAD:  Short curly red hair.  Normocephalic, atraumatic, face symmetric, no Cushingoid features. EYES:  Blue eyes.  Pupils equal round and reactive to light and accomodation.  No conjunctivitis or scleral icterus. ENT:  Oropharynx clear without lesion.  Tongue normal. Mucous membranes moist.  RESPIRATORY:  Clear to auscultation without rales, wheezes or rhonchi. CARDIOVASCULAR:  Regular rate and rhythm without murmur, rub or gallop. ABDOMEN:  Soft, non-tender, with active bowel sounds, and no appreciable hepatosplenomegaly.  No masses. SKIN:  No rashes, ulcers or lesions. EXTREMITIES: No edema, no skin discoloration or tenderness.  No palpable cords. LYMPH NODES: No palpable cervical, supraclavicular, axillary or inguinal adenopathy  NEUROLOGICAL: Unremarkable. PSYCH:  Appropriate.   Appointment on 04/30/2016  Component Date Value Ref Range Status  . WBC 04/30/2016 8.3  3.6 - 11.0 K/uL Final  . RBC 04/30/2016 4.65  3.80 - 5.20 MIL/uL Final  . Hemoglobin 04/30/2016 13.5  12.0 - 16.0 g/dL Final  . HCT 04/30/2016 39.4  35.0 - 47.0 % Final  . MCV 04/30/2016 84.8  80.0 - 100.0 fL Final  . MCH 04/30/2016 29.0  26.0 - 34.0 pg Final  . MCHC 04/30/2016 34.2  32.0 - 36.0 g/dL Final  . RDW 04/30/2016 14.6* 11.5 - 14.5 % Final  . Platelets 04/30/2016 193  150 - 440 K/uL Final  . Neutrophils Relative % 04/30/2016 63  % Final  . Neutro Abs 04/30/2016 5.3  1.4 - 6.5 K/uL Final  . Lymphocytes Relative 04/30/2016 27  % Final  . Lymphs Abs 04/30/2016 2.2  1.0 - 3.6 K/uL Final  . Monocytes Relative 04/30/2016 9  % Final  . Monocytes  Absolute 04/30/2016 0.7  0.2 - 0.9 K/uL Final  . Eosinophils Relative 04/30/2016 1  % Final  . Eosinophils Absolute 04/30/2016 0.1  0 - 0.7 K/uL Final  . Basophils Relative 04/30/2016 0  % Final  . Basophils Absolute 04/30/2016 0.0  0 - 0.1 K/uL Final  . Sodium 04/30/2016 137  135 - 145 mmol/L Final  . Potassium 04/30/2016 4.3  3.5 - 5.1 mmol/L Final  . Chloride 04/30/2016 102  101 - 111 mmol/L Final  . CO2 04/30/2016 28  22 - 32 mmol/L Final  . Glucose, Bld 04/30/2016 130* 65 - 99 mg/dL  Final  . BUN 04/30/2016 12  6 - 20 mg/dL Final  . Creatinine, Ser 04/30/2016 0.71  0.44 - 1.00 mg/dL Final  . Calcium 04/30/2016 9.3  8.9 - 10.3 mg/dL Final  . Total Protein 04/30/2016 7.7  6.5 - 8.1 g/dL Final  . Albumin 04/30/2016 4.2  3.5 - 5.0 g/dL Final  . AST 04/30/2016 27  15 - 41 U/L Final  . ALT 04/30/2016 33  14 - 54 U/L Final  . Alkaline Phosphatase 04/30/2016 96  38 - 126 U/L Final  . Total Bilirubin 04/30/2016 1.0  0.3 - 1.2 mg/dL Final  . GFR calc non Af Amer 04/30/2016 >60  >60 mL/min Final  . GFR calc Af Amer 04/30/2016 >60  >60 mL/min Final   Comment: (NOTE) The eGFR has been calculated using the CKD EPI equation. This calculation has not been validated in all clinical situations. eGFR's persistently <60 mL/min signify possible Chronic Kidney Disease.   . Anion gap 04/30/2016 7  5 - 15 Final  Office Visit on 04/30/2016  Component Date Value Ref Range Status  . Hemoglobin A1C 04/30/2016 6.9   Final  . Microalbumin Ur, POC 04/30/2016 0  mg/L Final    Assessment:  KAMRYNN MELOTT is a 61 y.o. female with portal vein thrombosis presenting with fever, chills, dehydration, weight loss, and abdominal pain.  Symptoms lasted for about 4 weeks.  Abdominal and pelvic CT on 11/07/2014 revealed a partially thrombosed portal veins (left > right).  The main portal vein and mesenteric veins remained patent.  Porta hepatis nodes measured 1.2 cm in short axis (stable).  Hypercoagulable work-up on  11/07/2014 revealed Factor V Leiden heterozygote for R506Q mutation.  The following studies were normal:  prothrombin gene mutation, lupus anticoagulant panel, anticardiolipin antibodies, homocysteine, protein C antigen (88%), protein C activity (111%), protein S antigen total (131%), protein S antigen free (108%), anti-thrombin III antigen (88%), anti-thrombin III functional (101%).  On 12/11/2014, protein S antigen total (95%), protein S antigen free (77%), and protein S activity (69%) were normal.  PNH by flow cytometry was negative.  JAK2 V617F and exon 12 were negative on 05/02/2015.  Additional testing on 11/27/2014 revealed resolution of anemia.  The following studies were normal:  SPEP, free light chain ratio, 24 hour UPEP, IgG, IgA, ANA, ESR, ferritin, reticulocyte count, B12, and folate.  Serum protein electrophoresis revealed no monoclonal protein.  Kappa free light chains were 26.67, lambda free light chains 24.67 and ratio 1.08 (normal).   IgM was 247 (26-217).    She was started on Xarelto on 11/08/2014.  Plan is for long term anticoagulation given the unprovoked thrombus.  Ultrasound hepatic doppler on 05/02/2015 revealed no convincing evidence of occlusion or thrombus.  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 There was interval reconstitution of previously noted thrombosis within the peripheral branches of the right portal vein. The right and main portal veins appeared widely patent.  A discrete hepatic lesion/mass was not identified.  There was no evidence of cirrhosis. There was unchanged borderline splenomegaly without evidence of gastroesophageal varices or ascites.  There was extensive colonic diverticulosis.  She denies any prior history of thrombosis.  She has not been on birth control pills.  She denies any family history of thrombosis.   Screening studies for malignancy were normal.  Chest x-ray on 11/05/2014 was  negative.  Bilateral mammogram in 11/14/2015 with follow-up diagnostic left mammogram 11/25/2015 revealed no evidence  of malignancy.  Colonoscopy on 06/07/2013 revealed only diverticulosis.    Symptomatically, she feels denies any complaint.  Exam is unremarkable.  Plan: 1.  Labs today:  CBC with diff, CMP. 2.  Continue Xarelto 20 mg a day. 3.  Patient to contact clinic between appointments if any concerns. 4.  RTC in 3 months for labs (CBC with diff, CMP) 5.  RTC in 6 months for MD assessment and labs (CBC with diff, CMP).   Lequita Asal, MD  04/30/2016, 10:17 AM

## 2016-05-01 LAB — VITAMIN D 25 HYDROXY (VIT D DEFICIENCY, FRACTURES): Vit D, 25-Hydroxy: 37 ng/mL (ref 30–100)

## 2016-05-02 ENCOUNTER — Encounter: Payer: Self-pay | Admitting: Hematology and Oncology

## 2016-06-18 LAB — HM DIABETES EYE EXAM

## 2016-06-19 ENCOUNTER — Encounter: Payer: Self-pay | Admitting: Family Medicine

## 2016-07-24 ENCOUNTER — Encounter: Payer: Self-pay | Admitting: Family Medicine

## 2016-07-24 ENCOUNTER — Inpatient Hospital Stay: Payer: BC Managed Care – PPO | Attending: Hematology and Oncology

## 2016-07-24 ENCOUNTER — Ambulatory Visit (INDEPENDENT_AMBULATORY_CARE_PROVIDER_SITE_OTHER): Payer: BC Managed Care – PPO | Admitting: Family Medicine

## 2016-07-24 VITALS — BP 118/58 | HR 71 | Temp 97.9°F | Resp 16 | Ht 69.0 in | Wt 235.3 lb

## 2016-07-24 DIAGNOSIS — J3089 Other allergic rhinitis: Secondary | ICD-10-CM | POA: Diagnosis not present

## 2016-07-24 DIAGNOSIS — J302 Other seasonal allergic rhinitis: Secondary | ICD-10-CM

## 2016-07-24 DIAGNOSIS — D6851 Activated protein C resistance: Secondary | ICD-10-CM | POA: Diagnosis not present

## 2016-07-24 DIAGNOSIS — I81 Portal vein thrombosis: Secondary | ICD-10-CM | POA: Insufficient documentation

## 2016-07-24 DIAGNOSIS — Z7901 Long term (current) use of anticoagulants: Secondary | ICD-10-CM | POA: Insufficient documentation

## 2016-07-24 DIAGNOSIS — E1129 Type 2 diabetes mellitus with other diabetic kidney complication: Secondary | ICD-10-CM

## 2016-07-24 DIAGNOSIS — Z7984 Long term (current) use of oral hypoglycemic drugs: Secondary | ICD-10-CM | POA: Insufficient documentation

## 2016-07-24 DIAGNOSIS — E785 Hyperlipidemia, unspecified: Secondary | ICD-10-CM

## 2016-07-24 DIAGNOSIS — K219 Gastro-esophageal reflux disease without esophagitis: Secondary | ICD-10-CM

## 2016-07-24 DIAGNOSIS — I10 Essential (primary) hypertension: Secondary | ICD-10-CM | POA: Insufficient documentation

## 2016-07-24 DIAGNOSIS — R809 Proteinuria, unspecified: Secondary | ICD-10-CM

## 2016-07-24 DIAGNOSIS — E559 Vitamin D deficiency, unspecified: Secondary | ICD-10-CM | POA: Diagnosis not present

## 2016-07-24 DIAGNOSIS — E119 Type 2 diabetes mellitus without complications: Secondary | ICD-10-CM | POA: Diagnosis not present

## 2016-07-24 DIAGNOSIS — R161 Splenomegaly, not elsewhere classified: Secondary | ICD-10-CM | POA: Insufficient documentation

## 2016-07-24 DIAGNOSIS — Z7982 Long term (current) use of aspirin: Secondary | ICD-10-CM | POA: Diagnosis not present

## 2016-07-24 LAB — CBC WITH DIFFERENTIAL/PLATELET
Basophils Absolute: 0 10*3/uL (ref 0–0.1)
Basophils Relative: 0 %
Eosinophils Absolute: 0 10*3/uL (ref 0–0.7)
Eosinophils Relative: 0 %
HCT: 40.2 % (ref 35.0–47.0)
Hemoglobin: 13.6 g/dL (ref 12.0–16.0)
Lymphocytes Relative: 15 %
Lymphs Abs: 1.7 10*3/uL (ref 1.0–3.6)
MCH: 28.6 pg (ref 26.0–34.0)
MCHC: 33.9 g/dL (ref 32.0–36.0)
MCV: 84.5 fL (ref 80.0–100.0)
Monocytes Absolute: 0.7 10*3/uL (ref 0.2–0.9)
Monocytes Relative: 6 %
Neutro Abs: 9 10*3/uL — ABNORMAL HIGH (ref 1.4–6.5)
Neutrophils Relative %: 79 %
Platelets: 149 10*3/uL — ABNORMAL LOW (ref 150–400)
RBC: 4.75 MIL/uL (ref 3.80–5.20)
RDW: 15.2 % — ABNORMAL HIGH (ref 11.5–14.5)
WBC: 11.5 10*3/uL — ABNORMAL HIGH (ref 3.6–11.0)

## 2016-07-24 LAB — COMPREHENSIVE METABOLIC PANEL
ALT: 24 U/L (ref 14–54)
AST: 24 U/L (ref 15–41)
Albumin: 4.3 g/dL (ref 3.5–5.0)
Alkaline Phosphatase: 107 U/L (ref 38–126)
Anion gap: 6 (ref 5–15)
BUN: 11 mg/dL (ref 6–20)
CO2: 29 mmol/L (ref 22–32)
Calcium: 9.8 mg/dL (ref 8.9–10.3)
Chloride: 102 mmol/L (ref 101–111)
Creatinine, Ser: 0.69 mg/dL (ref 0.44–1.00)
GFR calc Af Amer: >60 mL/min (ref >60)
GFR calc non Af Amer: >60 mL/min (ref >60)
Glucose, Bld: 130 mg/dL — ABNORMAL HIGH (ref 65–99)
Potassium: 4.2 mmol/L (ref 3.5–5.1)
Sodium: 137 mmol/L (ref 135–145)
Total Bilirubin: 1 mg/dL (ref 0.3–1.2)
Total Protein: 8 g/dL (ref 6.5–8.1)

## 2016-07-24 LAB — POCT GLYCOSYLATED HEMOGLOBIN (HGB A1C): HEMOGLOBIN A1C: 6.6

## 2016-07-24 MED ORDER — LOSARTAN POTASSIUM-HCTZ 100-12.5 MG PO TABS: 0.5000 | ORAL_TABLET | Freq: Every day | ORAL | 0 refills | Status: DC

## 2016-07-24 MED ORDER — MONTELUKAST SODIUM 10 MG PO TABS: 10.0000 mg | ORAL_TABLET | Freq: Every day | ORAL | 1 refills | Status: DC

## 2016-07-24 MED ORDER — METFORMIN HCL ER 500 MG PO TB24: 500.0000 mg | ORAL_TABLET | Freq: Every day | ORAL | 0 refills | Status: DC

## 2016-07-24 NOTE — Progress Notes (Signed)
Name: Emily Pitts   MRN: [id number]    DOB: Aug 07, 1955   Date:07/24/2016       Progress Note  Subjective  Chief Complaint  Chief Complaint  Patient presents with  . Diabetes    3 month follow up checks every other week 97-158 avg  . Nausea    and dizziness started last night  . Hypertension  . Hyperlipidemia    HPI  Diabetes Type II with microalbuminuria: she is on Metformin just one pill daily and hgbA1C has gone down from 6.9% to 6.6%.  She deniesblurred vision, polyphagia, polyuria or polydipsia. Chronic nocturia once per night - she drinks a lot of water in the evenings. Complaint with diabetic diet. Glucose fasting is around 125-130's  Fasting, but can go up to 150's in am's.    Obesity: she is going home after work, eating, watching TV, she has not been falling asleep on her recliner lately. Walking at work not at home.  Discussed importance of resuming her physical activity   HTN: she is on Losartan/hctz and bp is towards low end of normal, she woke up feeling dizzy this morning. No chest pain, SOB or palpitation   Hyperlipidemia: she is now on Atorvastatin and denies side effects. Denies side effects  GERD: she stopped taking Omeprazole, controlled with life style modification, no heartburn or regurgitation  Perennial AR with seasonal variation: Occasional sneezing, rhinorrhea and headaches, worse in the Spring and Fall. Still taking Xyzal, and singulair only prn. Stable at this time   Portal Vein Thrombosis: seen hematologist, taking Xarelto, denies any bleeding. She will see Dr. Mike Gip today   Patient Active Problem List   Diagnosis Date Noted  . Long term current use of anticoagulant therapy 01/05/2016  . Factor 5 Leiden mutation, heterozygous ([place]) 05/02/2015  . Portal vein thrombosis 11/07/2014  . History of sepsis 10/10/2014  . Abnormal electrocardiogram 10/08/2014  . Nonspecific abnormal electromyogram (EMG) 10/08/2014  . Benign essential HTN  10/08/2014  . Diabetes mellitus with renal manifestation ([place]) 10/08/2014  . Dyslipidemia 10/08/2014  . LBP (low back pain) 10/08/2014  . Gastro-esophageal reflux disease without esophagitis 10/08/2014  . Microalbuminuria 10/08/2014  . Disturbance of skin sensation 10/08/2014  . Obesity (BMI 30-39.9) 10/08/2014  . Perennial allergic rhinitis with seasonal variation 10/08/2014  . Plantar fasciitis 10/08/2014  . Postablative ovarian failure 10/08/2014  . Menopausal symptom 10/08/2014  . Vitamin D deficiency 10/08/2014    Past Surgical History:  Procedure Laterality Date  . ABDOMINAL HYSTERECTOMY  2010  . APPENDECTOMY  1966  . COLONOSCOPY  06-07-13   Dr Bary Castilla  . DILATION AND CURETTAGE OF UTERUS      Family History  Problem Relation Age of Onset  . Diabetes Mother   . COPD Mother   . Diabetes Father   . Hypertension Father   . Cancer Father        Kidney and Prostate  . CAD Father   . Diabetes Brother        [employer]  . Kidney disease Brother        Tumor removed  . Breast cancer Neg Hx     Social History   Social History  . Marital status: Single    Spouse name: N/A  . Number of children: N/A  . Years of education: N/A   Occupational History  . Not on file.   Social History Main Topics  . Smoking status: Never Smoker  . Smokeless tobacco: Never Used  .  Alcohol use No  . Drug use: No  . Sexual activity: No   Other Topics Concern  . Not on file   Social History Narrative  . No narrative on file     Current Outpatient Prescriptions:  .  aspirin EC 81 MG tablet, Take 81 mg by mouth daily., Disp: , Rfl:  .  atorvastatin (LIPITOR) 40 MG tablet, Take 1 tablet (40 mg total) by mouth daily., Disp: 90 tablet, Rfl: 1 .  chlorzoxazone (PARAFON) 500 MG tablet, TAKE ONE TABLET BY MOUTH THREE TIMES DAILY, Disp: 90 tablet, Rfl: 0 .  cholecalciferol (VITAMIN D) 1000 units tablet, Take 1,000 Units by mouth daily., Disp: , Rfl:  .  fluticasone (FLONASE) 50  MCG/ACT nasal spray, Place 2 sprays into both nostrils as needed., Disp: , Rfl:  .  glucose blood (ONE TOUCH ULTRA TEST) test strip, Use as instructed, Disp: 100 each, Rfl: 12 .  levocetirizine (XYZAL) 5 MG tablet, Take 1 tablet (5 mg total) by mouth daily., Disp: 90 tablet, Rfl: 1 .  losartan-hydrochlorothiazide (HYZAAR) 100-12.5 MG tablet, Take 0.5 tablets by mouth daily. Did not send rx on 07/24/2016 , she will try taking half, Disp: 90 tablet, Rfl: 0 .  magnesium oxide (MAG-OX) 400 MG tablet, Take 400 mg by mouth daily., Disp: , Rfl:  .  metFORMIN (GLUCOPHAGE-XR) 500 MG 24 hr tablet, Take 1 tablet (500 mg total) by mouth daily with breakfast., Disp: 90 tablet, Rfl: 0 .  montelukast (SINGULAIR) 10 MG tablet, Take 1 tablet (10 mg total) by mouth at bedtime., Disp: 90 tablet, Rfl: 1 .  vitamin B-12 (CYANOCOBALAMIN) 1000 MCG tablet, Take 1,000 mcg by mouth daily., Disp: , Rfl:  .  vitamin C (ASCORBIC ACID) 500 MG tablet, Take 500 mg by mouth daily., Disp: , Rfl:  .  XARELTO 20 MG TABS tablet, TAKE ONE TABLET BY MOUTH ONCE DAILY WITH  SUPPER, Disp: 30 tablet, Rfl: 5  No Known Allergies   ROS  Constitutional: Negative for fever or weight change.  Respiratory: Negative for cough and shortness of breath.   Cardiovascular: Negative for chest pain or palpitations.  Gastrointestinal: Negative for abdominal pain, no bowel changes.  Musculoskeletal: Negative for gait problem or joint swelling.  Skin: Negative for rash.  Neurological: Positive  for dizziness but no  headache.  No other specific complaints in a complete review of systems (except as listed in HPI above).  Objective  Vitals:   07/24/16 0941  BP: (!) 118/58  Pulse: 71  Resp: 16  Temp: 97.9 F (36.6 C)  SpO2: 93%  Weight: 235 lb 5 oz (106.7 kg)  Height: _0  (1.753 m)    Body mass index is 34.75 kg/m.  Physical Exam  Constitutional: Patient appears well-developed and well-nourished. Obese No distress.  HEENT: head  atraumatic, normocephalic, pupils equal and reactive to light,  neck supple, throat within normal limits Cardiovascular: Normal rate, regular rhythm and normal heart sounds.  No murmur heard. Trace BLE edema. Pulmonary/Chest: Effort normal and breath sounds normal. No respiratory distress. Abdominal: Soft.  There is no tenderness. Psychiatric: Patient has a normal mood and affect. behavior is normal. Judgment and thought content normal.  Recent Results (from the past 2160 hour(s))  POCT HgB A1C     Status: Abnormal   Collection Time: 04/30/16  8:21 AM  Result Value Ref Range   Hemoglobin A1C 6.9   POCT UA - Microalbumin     Status: Normal   Collection Time: 04/30/16  8:22 AM  Result Value Ref Range   Microalbumin Ur, POC 0 mg/L   Creatinine, POC  mg/dL   Albumin/Creatinine Ratio, Urine, POC    Lipid panel     Status: None   Collection Time: 04/30/16  8:45 AM  Result Value Ref Range   Cholesterol 123 <200 mg/dL   Triglycerides 104 <150 mg/dL   HDL 55 >50 mg/dL   Total CHOL/HDL Ratio 2.2 <5.0 Ratio   VLDL 21 <30 mg/dL   LDL Cholesterol 47 <100 mg/dL  COMPLETE METABOLIC PANEL WITH GFR     Status: Abnormal   Collection Time: 04/30/16  8:45 AM  Result Value Ref Range   Sodium 141 135 - 146 mmol/L   Potassium 4.6 3.5 - 5.3 mmol/L   Chloride 103 98 - 110 mmol/L   CO2 29 20 - 31 mmol/L   Glucose, Bld 130 (H) 65 - 99 mg/dL   BUN 11 7 - 25 mg/dL   Creat 0.78 0.50 - 0.99 mg/dL    Comment:   For patients > or = 62 years of age: The upper reference limit for Creatinine is approximately 13% higher for people identified as African-American.      Total Bilirubin 0.8 0.2 - 1.2 mg/dL   Alkaline Phosphatase 100 33 - 130 U/L   AST 22 10 - 35 U/L   ALT 30 (H) 6 - 29 U/L   Total Protein 7.4 6.1 - 8.1 g/dL   Albumin 4.1 3.6 - 5.1 g/dL   Calcium 9.7 8.6 - 10.4 mg/dL   GFR, Est African American >89 >=60 mL/min   GFR, Est Non African American 82 >=60 mL/min  VITAMIN D 25 Hydroxy (Vit-D  Deficiency, Fractures)     Status: None   Collection Time: 04/30/16  8:45 AM  Result Value Ref Range   Vit D, 25-Hydroxy 37 30 - 100 ng/mL    Comment: Vitamin D Status           25-OH Vitamin D        Deficiency                <20 ng/mL        Insufficiency         20 - 29 ng/mL        Optimal             > or = 30 ng/mL   For 25-OH Vitamin D testing on patients on D2-supplementation and patients for whom quantitation of D2 and D3 fractions is required, the Quest[facility]D 25-OH VIT D, (D2,D3), LC/MS/MS is recommended: order code [id number] (patients > 2 yrs).   CBC with Differential/Platelet     Status: Abnormal   Collection Time: 04/30/16  9:21 AM  Result Value Ref Range   WBC 8.3 3.6 - 11.0 K/uL   RBC 4.65 3.80 - 5.20 MIL/uL   Hemoglobin 13.5 12.0 - 16.0 g/dL   HCT 39.4 35.0 - 47.0 %   MCV 84.8 80.0 - 100.0 fL   MCH 29.0 26.0 - 34.0 pg   MCHC 34.2 32.0 - 36.0 g/dL   RDW 14.6 (H) 11.5 - 14.5 %   Platelets 193 150 - 440 K/uL   Neutrophils Relative % 63 %   Neutro Abs 5.3 1.4 - 6.5 K/uL   Lymphocytes Relative 27 %   Lymphs Abs 2.2 1.0 - 3.6 K/uL   Monocytes Relative 9 %   Monocytes Absolute 0.7 0.2 - 0.9 K/uL   Eosinophils Relative 1 %  Eosinophils Absolute 0.1 0 - 0.7 K/uL   Basophils Relative 0 %   Basophils Absolute 0.0 0 - 0.1 K/uL  Comprehensive metabolic panel     Status: Abnormal   Collection Time: 04/30/16  9:21 AM  Result Value Ref Range   Sodium 137 135 - 145 mmol/L   Potassium 4.3 3.5 - 5.1 mmol/L   Chloride 102 101 - 111 mmol/L   CO2 28 22 - 32 mmol/L   Glucose, Bld 130 (H) 65 - 99 mg/dL   BUN 12 6 - 20 mg/dL   Creatinine, Ser 0.71 0.44 - 1.00 mg/dL   Calcium 9.3 8.9 - 10.3 mg/dL   Total Protein 7.7 6.5 - 8.1 g/dL   Albumin 4.2 3.5 - 5.0 g/dL   AST 27 15 - 41 U/L   ALT 33 14 - 54 U/L   Alkaline Phosphatase 96 38 - 126 U/L   Total Bilirubin 1.0 0.3 - 1.2 mg/dL   GFR calc non Af Amer >60 >60 mL/min   GFR calc Af Amer >60 >60 mL/min    Comment: (NOTE) The  eGFR has been calculated using the CKD EPI equation. This calculation has not been validated in all clinical situations. eGFR's persistently <60 mL/min signify possible Chronic Kidney Disease.    Anion gap 7 5 - 15  HM DIABETES EYE EXAM     Status: None   Collection Time: 06/18/16 12:00 AM  Result Value Ref Range   HM Diabetic Eye Exam No Retinopathy No Retinopathy    Comment: [facility], Dr. George Ina  POCT HgB A1C     Status: Abnormal   Collection Time: 07/24/16  9:45 AM  Result Value Ref Range   Hemoglobin A1C 6.6      PHQ2/9: Depression screen [facility] 2/9 04/30/2016 02/03/2016 10/30/2015 08/15/2015 05/02/2015  Decreased Interest 0 0 0 0 0  Down, Depressed, Hopeless 0 0 0 0 0  PHQ - 2 Score 0 0 0 0 0     Fall Risk: Fall Risk  04/30/2016 02/03/2016 10/30/2015 08/15/2015 05/02/2015  Falls in the past year? No No No Yes Yes  Number falls in past yr: - - - 1 1  Injury with Fall? - - - No No     Assessment & Plan  1. Type 2 diabetes mellitus with microalbuminuria, without long-term current use of insulin (HCC)  - POCT HgB A1C - losartan-hydrochlorothiazide (HYZAAR) 100-12.5 MG tablet; Take 0.5 tablets by mouth daily. Did not send rx on 07/24/2016 , she will try taking half  Dispense: 90 tablet; Refill: 0 - metFORMIN (GLUCOPHAGE-XR) 500 MG 24 hr tablet; Take 1 tablet (500 mg total) by mouth daily with breakfast.  Dispense: 90 tablet; Refill: 0  2. Benign essential HTN  Getting light headed and bp is towards low end of normal, she will take half of losartan/hctz and drink fluids and get up slowly  - losartan-hydrochlorothiazide (HYZAAR) 100-12.5 MG tablet; Take 0.5 tablets by mouth daily. Did not send rx on 07/24/2016 , she will try taking half  Dispense: 90 tablet; Refill: 0  3. Dyslipidemia  Continue medications, reviewed last labs and it is at goal   4. Perennial allergic rhinitis with seasonal variation  - montelukast (SINGULAIR) 10 MG tablet; Take 1 tablet (10 mg  total) by mouth at bedtime.  Dispense: 90 tablet; Refill: 1  5. Gastro-esophageal reflux disease without esophagitis  Well controlled

## 2016-08-18 ENCOUNTER — Encounter: Payer: Self-pay | Admitting: Family Medicine

## 2016-08-18 ENCOUNTER — Ambulatory Visit (INDEPENDENT_AMBULATORY_CARE_PROVIDER_SITE_OTHER): Payer: BC Managed Care – PPO | Admitting: Family Medicine

## 2016-08-18 VITALS — BP 124/68 | HR 100 | Temp 98.3°F | Resp 16 | Ht 69.0 in | Wt 233.5 lb

## 2016-08-18 DIAGNOSIS — M25461 Effusion, right knee: Secondary | ICD-10-CM

## 2016-08-18 MED ORDER — MENTHOL (TOPICAL ANALGESIC) 4 % EX GEL: 2.0000 g | Freq: Four times a day (QID) | CUTANEOUS | 1 refills | Status: DC

## 2016-08-18 NOTE — Progress Notes (Signed)
Name: Emily Pitts   MRN: [id number]    DOB: [birth year]   Date:08/18/2016       Progress Note  Subjective  Chief Complaint  Chief Complaint  Patient presents with  . Knee Pain    right knee swelling, feels tight, for 3 days    HPI  Pt presents with 3 day history of RIGHT knee swelling and pain with some heat. She denies history of similar episodes, no hx arthritis to this joint, no hx gout, she denies recent travel or sitting for long periods of time, no recent injury.  She is able to bear weight but notes pain with ambulation. On anticoagulation and cannot take NSAIDS.  Has been taking tylenol with minimal relief.  Patient Active Problem List   Diagnosis Date Noted  . Long term current use of anticoagulant therapy 01/05/2016  . Factor 5 Leiden mutation, heterozygous ([place]) 05/02/2015  . Portal vein thrombosis 11/07/2014  . History of sepsis 10/10/2014  . Abnormal electrocardiogram 10/08/2014  . Nonspecific abnormal electromyogram (EMG) 10/08/2014  . Benign essential HTN 10/08/2014  . Diabetes mellitus with renal manifestation ([place]) 10/08/2014  . Dyslipidemia 10/08/2014  . LBP (low back pain) 10/08/2014  . Gastro-esophageal reflux disease without esophagitis 10/08/2014  . Microalbuminuria 10/08/2014  . Disturbance of skin sensation 10/08/2014  . Obesity (BMI 30-39.9) 10/08/2014  . Perennial allergic rhinitis with seasonal variation 10/08/2014  . Plantar fasciitis 10/08/2014  . Postablative ovarian failure 10/08/2014  . Menopausal symptom 10/08/2014  . Vitamin D deficiency 10/08/2014    Social History  Substance Use Topics  . Smoking status: Never Smoker  . Smokeless tobacco: Never Used  . Alcohol use No     Current Outpatient Prescriptions:  .  aspirin EC 81 MG tablet, Take 81 mg by mouth daily., Disp: , Rfl:  .  atorvastatin (LIPITOR) 40 MG tablet, Take 1 tablet (40 mg total) by mouth daily., Disp: 90 tablet, Rfl: 1 .  chlorzoxazone (PARAFON) 500 MG tablet, TAKE ONE  TABLET BY MOUTH THREE TIMES DAILY, Disp: 90 tablet, Rfl: 0 .  cholecalciferol (VITAMIN D) 1000 units tablet, Take 1,000 Units by mouth daily., Disp: , Rfl:  .  fluticasone (FLONASE) 50 MCG/ACT nasal spray, Place 2 sprays into both nostrils as needed., Disp: , Rfl:  .  glucose blood (ONE TOUCH ULTRA TEST) test strip, Use as instructed, Disp: 100 each, Rfl: 12 .  levocetirizine (XYZAL) 5 MG tablet, Take 1 tablet (5 mg total) by mouth daily., Disp: 90 tablet, Rfl: 1 .  losartan-hydrochlorothiazide (HYZAAR) 100-12.5 MG tablet, Take 0.5 tablets by mouth daily. Did not send rx on 07/24/2016 , she will try taking half, Disp: 90 tablet, Rfl: 0 .  magnesium oxide (MAG-OX) 400 MG tablet, Take 400 mg by mouth daily., Disp: , Rfl:  .  metFORMIN (GLUCOPHAGE-XR) 500 MG 24 hr tablet, Take 1 tablet (500 mg total) by mouth daily with breakfast., Disp: 90 tablet, Rfl: 0 .  montelukast (SINGULAIR) 10 MG tablet, Take 1 tablet (10 mg total) by mouth at bedtime., Disp: 90 tablet, Rfl: 1 .  vitamin B-12 (CYANOCOBALAMIN) 1000 MCG tablet, Take 1,000 mcg by mouth daily., Disp: , Rfl:  .  vitamin C (ASCORBIC ACID) 500 MG tablet, Take 500 mg by mouth daily., Disp: , Rfl:  .  XARELTO 20 MG TABS tablet, TAKE ONE TABLET BY MOUTH ONCE DAILY WITH  SUPPER, Disp: 30 tablet, Rfl: 5  No Known Allergies  ROS  Ten systems reviewed and is negative except as  mentioned in HPI  Objective  Vitals:   08/18/16 1002  BP: 124/68  Pulse: 100  Resp: 16  Temp: 98.3 F (36.8 C)  TempSrc: Oral  SpO2: 95%  Weight: 233 lb 8 oz (105.9 kg)  Height: _0  (1.753 m)    Body mass index is 34.48 kg/m.  Nursing Note and Vital Signs reviewed.  Physical Exam   Constitutional: Patient appears well-developed and well-nourished. Obese, No distress.  HEENT: head atraumatic, normocephalic Cardiovascular: Normal rate, regular rhythm, S1/S2 present.  No murmur or rub heard. No BLE edema. Pulmonary/Chest: Effort normal and breath sounds  clear. No respiratory distress or retractions. MSK: Right knee and lower thigh exhibit swelling, no tenderness, no erythema, moderate increase in warmth on comparison to left knee.   Psychiatric: Patient has a normal mood and affect. behavior is normal. Judgment and thought content normal.  Recent Results (from the past 2160 hour(s))  HM DIABETES EYE EXAM     Status: None   Collection Time: 06/18/16 12:00 AM  Result Value Ref Range   HM Diabetic Eye Exam No Retinopathy No Retinopathy    Comment: [facility], Dr. George Ina  POCT HgB A1C     Status: Abnormal   Collection Time: 07/24/16  9:45 AM  Result Value Ref Range   Hemoglobin A1C 6.6   Comprehensive metabolic panel     Status: Abnormal   Collection Time: 07/24/16 10:30 AM  Result Value Ref Range   Sodium 137 135 - 145 mmol/L   Potassium 4.2 3.5 - 5.1 mmol/L   Chloride 102 101 - 111 mmol/L   CO2 29 22 - 32 mmol/L   Glucose, Bld 130 (H) 65 - 99 mg/dL   BUN 11 6 - 20 mg/dL   Creatinine, Ser 0.69 0.44 - 1.00 mg/dL   Calcium 9.8 8.9 - 10.3 mg/dL   Total Protein 8.0 6.5 - 8.1 g/dL   Albumin 4.3 3.5 - 5.0 g/dL   AST 24 15 - 41 U/L   ALT 24 14 - 54 U/L   Alkaline Phosphatase 107 38 - 126 U/L   Total Bilirubin 1.0 0.3 - 1.2 mg/dL   GFR calc non Af Amer >60 >60 mL/min   GFR calc Af Amer >60 >60 mL/min    Comment: (NOTE) The eGFR has been calculated using the CKD EPI equation. This calculation has not been validated in all clinical situations. eGFR's persistently <60 mL/min signify possible Chronic Kidney Disease.    Anion gap 6 5 - 15  CBC with Differential/Platelet     Status: Abnormal   Collection Time: 07/24/16 10:30 AM  Result Value Ref Range   WBC 11.5 (H) 3.6 - 11.0 K/uL   RBC 4.75 3.80 - 5.20 MIL/uL   Hemoglobin 13.6 12.0 - 16.0 g/dL   HCT 40.2 35.0 - 47.0 %   MCV 84.5 80.0 - 100.0 fL   MCH 28.6 26.0 - 34.0 pg   MCHC 33.9 32.0 - 36.0 g/dL   RDW 15.2 (H) 11.5 - 14.5 %   Platelets 149 (L) 150 - 400 K/uL     Comment: PLATELET CLUMPS NOTED ON SMEAR, COUNT APPEARS ADEQUATE PLATELET COUNT CONFIRMED BY SMEAR    Neutrophils Relative % 79 %   Neutro Abs 9.0 (H) 1.4 - 6.5 K/uL   Lymphocytes Relative 15 %   Lymphs Abs 1.7 1.0 - 3.6 K/uL   Monocytes Relative 6 %   Monocytes Absolute 0.7 0.2 - 0.9 K/uL   Eosinophils Relative 0 %  Eosinophils Absolute 0.0 0 - 0.7 K/uL   Basophils Relative 0 %   Basophils Absolute 0.0 0 - 0.1 K/uL     Assessment & Plan  1. Effusion of right knee joint - Ambulatory referral to Orthopedic Surgery - Menthol, Topical Analgesic, 4 % GEL; Apply 2 g topically 4 (four) times daily.  Dispense: 1 Tube; Refill: 1  She was seen by [facility] earlier this year for shoulder pain, advised her to return for evaluation of inflammatory arthritis if she has other joint problems.   -Red flags and when to present for emergency care or RTC including fever >101.47F, chest pain, shortness of breath, new/worsening/un-resolving symptoms, reviewed with patient at time of visit. Follow up and care instructions discussed and provided in AVS.  I saw the patient with  Johney Maine, FNP, NP-C.   Steele Sizer, MD [facility] 08/18/2016, 10:28 AM

## 2016-08-18 NOTE — Patient Instructions (Addendum)
Bursitis Bursitis is when the fluid-filled sac (bursa) that covers and protects a joint is swollen (inflamed). Bursitis is most common near joints, especially the knees, elbows, hips, and shoulders. Follow these instructions at home:  Take medicines only as told by your doctor.  If you were prescribed an antibiotic medicine, finish it all even if you start to feel better.  Rest the affected area as told by your doctor. ? Keep the area raised up. ? Avoid doing things that make the pain worse.  Apply ice to the injured area: ? Place ice in a plastic bag. ? Place a towel between your skin and the bag. ? Leave the ice on for 20 minutes, 2-3 times a day.  Use splints, braces, pads, or walking aids as told by your doctor.  Keep all follow-up visits as told by your doctor. This is important. Contact a doctor if:  You have more pain with home care.  You have a fever.  You have chills. This information is not intended to replace advice given to you by your health care provider. Make sure you discuss any questions you have with your health care provider. Document Released: 08/13/2009 Document Revised: 08/01/2015 Document Reviewed: 05/15/2013 Elsevier Interactive Patient Education  2018 [employer] Inc.  

## 2016-09-29 ENCOUNTER — Other Ambulatory Visit: Payer: Self-pay | Admitting: Hematology and Oncology

## 2016-09-29 DIAGNOSIS — D6851 Activated protein C resistance: Secondary | ICD-10-CM

## 2016-09-29 DIAGNOSIS — I81 Portal vein thrombosis: Secondary | ICD-10-CM

## 2016-10-30 ENCOUNTER — Inpatient Hospital Stay: Payer: BC Managed Care – PPO | Attending: Hematology and Oncology

## 2016-10-30 ENCOUNTER — Ambulatory Visit (INDEPENDENT_AMBULATORY_CARE_PROVIDER_SITE_OTHER): Payer: BC Managed Care – PPO | Admitting: Family Medicine

## 2016-10-30 ENCOUNTER — Ambulatory Visit: Payer: BC Managed Care – PPO | Admitting: Hematology and Oncology

## 2016-10-30 ENCOUNTER — Encounter: Payer: Self-pay | Admitting: Hematology and Oncology

## 2016-10-30 ENCOUNTER — Inpatient Hospital Stay (HOSPITAL_BASED_OUTPATIENT_CLINIC_OR_DEPARTMENT_OTHER): Payer: BC Managed Care – PPO | Admitting: Hematology and Oncology

## 2016-10-30 ENCOUNTER — Other Ambulatory Visit: Payer: BC Managed Care – PPO

## 2016-10-30 ENCOUNTER — Encounter: Payer: Self-pay | Admitting: Family Medicine

## 2016-10-30 VITALS — BP 134/72 | HR 94 | Temp 98.0°F | Resp 18 | Wt 237.5 lb

## 2016-10-30 VITALS — BP 116/62 | HR 89 | Temp 98.0°F | Resp 16 | Ht 69.0 in | Wt 237.8 lb

## 2016-10-30 DIAGNOSIS — E559 Vitamin D deficiency, unspecified: Secondary | ICD-10-CM

## 2016-10-30 DIAGNOSIS — D6851 Activated protein C resistance: Secondary | ICD-10-CM

## 2016-10-30 DIAGNOSIS — I81 Portal vein thrombosis: Secondary | ICD-10-CM | POA: Diagnosis not present

## 2016-10-30 DIAGNOSIS — E119 Type 2 diabetes mellitus without complications: Secondary | ICD-10-CM | POA: Diagnosis not present

## 2016-10-30 DIAGNOSIS — J302 Other seasonal allergic rhinitis: Secondary | ICD-10-CM

## 2016-10-30 DIAGNOSIS — E1129 Type 2 diabetes mellitus with other diabetic kidney complication: Secondary | ICD-10-CM | POA: Diagnosis not present

## 2016-10-30 DIAGNOSIS — M94261 Chondromalacia, right knee: Secondary | ICD-10-CM | POA: Diagnosis not present

## 2016-10-30 DIAGNOSIS — R809 Proteinuria, unspecified: Secondary | ICD-10-CM | POA: Diagnosis not present

## 2016-10-30 DIAGNOSIS — I10 Essential (primary) hypertension: Secondary | ICD-10-CM | POA: Diagnosis not present

## 2016-10-30 DIAGNOSIS — E785 Hyperlipidemia, unspecified: Secondary | ICD-10-CM | POA: Insufficient documentation

## 2016-10-30 DIAGNOSIS — Z7982 Long term (current) use of aspirin: Secondary | ICD-10-CM | POA: Diagnosis not present

## 2016-10-30 DIAGNOSIS — Z7984 Long term (current) use of oral hypoglycemic drugs: Secondary | ICD-10-CM | POA: Diagnosis not present

## 2016-10-30 DIAGNOSIS — J3089 Other allergic rhinitis: Secondary | ICD-10-CM | POA: Diagnosis not present

## 2016-10-30 DIAGNOSIS — R161 Splenomegaly, not elsewhere classified: Secondary | ICD-10-CM | POA: Insufficient documentation

## 2016-10-30 DIAGNOSIS — K219 Gastro-esophageal reflux disease without esophagitis: Secondary | ICD-10-CM

## 2016-10-30 DIAGNOSIS — Z7901 Long term (current) use of anticoagulants: Secondary | ICD-10-CM | POA: Insufficient documentation

## 2016-10-30 LAB — CBC WITH DIFFERENTIAL/PLATELET
Basophils Absolute: 0 10*3/uL (ref 0–0.1)
Basophils Relative: 0 %
Eosinophils Absolute: 0.1 10*3/uL (ref 0–0.7)
Eosinophils Relative: 1 %
HCT: 37 % (ref 35.0–47.0)
Hemoglobin: 12.5 g/dL (ref 12.0–16.0)
Lymphocytes Relative: 17 %
Lymphs Abs: 1.7 10*3/uL (ref 1.0–3.6)
MCH: 28.6 pg (ref 26.0–34.0)
MCHC: 33.9 g/dL (ref 32.0–36.0)
MCV: 84.2 fL (ref 80.0–100.0)
Monocytes Absolute: 0.6 10*3/uL (ref 0.2–0.9)
Monocytes Relative: 7 %
Neutro Abs: 7.4 10*3/uL — ABNORMAL HIGH (ref 1.4–6.5)
Neutrophils Relative %: 75 %
Platelets: 215 10*3/uL (ref 150–440)
RBC: 4.39 MIL/uL (ref 3.80–5.20)
RDW: 15 % — ABNORMAL HIGH (ref 11.5–14.5)
WBC: 9.8 10*3/uL (ref 3.6–11.0)

## 2016-10-30 LAB — COMPREHENSIVE METABOLIC PANEL
ALT: 23 U/L (ref 14–54)
AST: 28 U/L (ref 15–41)
Albumin: 3.8 g/dL (ref 3.5–5.0)
Alkaline Phosphatase: 90 U/L (ref 38–126)
Anion gap: 8 (ref 5–15)
BUN: 15 mg/dL (ref 6–20)
CO2: 28 mmol/L (ref 22–32)
Calcium: 9.4 mg/dL (ref 8.9–10.3)
Chloride: 101 mmol/L (ref 101–111)
Creatinine, Ser: 0.79 mg/dL (ref 0.44–1.00)
GFR calc Af Amer: >60 mL/min (ref >60)
GFR calc non Af Amer: >60 mL/min (ref >60)
Glucose, Bld: 222 mg/dL — ABNORMAL HIGH (ref 65–99)
Potassium: 3.6 mmol/L (ref 3.5–5.1)
Sodium: 137 mmol/L (ref 135–145)
Total Bilirubin: 0.7 mg/dL (ref 0.3–1.2)
Total Protein: 7.2 g/dL (ref 6.5–8.1)

## 2016-10-30 LAB — POCT GLYCOSYLATED HEMOGLOBIN (HGB A1C): Hemoglobin A1C: 6.6

## 2016-10-30 MED ORDER — METFORMIN HCL ER 500 MG PO TB24: 500.0000 mg | ORAL_TABLET | Freq: Every day | ORAL | 1 refills | Status: DC

## 2016-10-30 MED ORDER — GLUCOSE BLOOD VI STRP: ORAL_STRIP | 12 refills | Status: DC

## 2016-10-30 MED ORDER — METFORMIN HCL ER 500 MG PO TB24: 1000.0000 mg | ORAL_TABLET | Freq: Every day | ORAL | 1 refills | Status: DC

## 2016-10-30 MED ORDER — LOSARTAN POTASSIUM-HCTZ 100-12.5 MG PO TABS: 0.5000 | ORAL_TABLET | Freq: Every day | ORAL | 1 refills | Status: DC

## 2016-10-30 MED ORDER — LOSARTAN POTASSIUM-HCTZ 100-12.5 MG PO TABS: 1.0000 | ORAL_TABLET | Freq: Every day | ORAL | 1 refills | Status: DC

## 2016-10-30 NOTE — Progress Notes (Signed)
Patient states her knee continues to hurt.  She has a hard time sleeping at night due to not being able to get comfortable with the knee.

## 2016-10-30 NOTE — Progress Notes (Signed)
[facility] day:  10/30/16   Chief Complaint: Emily Pitts is a 61 y.o. female with Factor V Leiden and portal vein thrombosis who is seen for 6 month assessment on Xarelto.  HPI:   The patient was last seen in the hematology clinic on 04/30/2016.  At that time, she was doing well.  She denied any bruising or bleeding.  Exam was unremarkable.  CBC and CMP were normal.  During the interim, she has continued to do well.  She denies any problems. She denies any bruising or bleeding on Xarelto therapy. She saw Dr.Sowles this morning. CBGs elevated (preprandials 110s-170s); Hgb A1c 6.6; Metformin was increased. Patient presents to the office in RIGHT knee brace; she has osteoarthritis. Patient due mammogram next month; PCP orders.    Past Medical History:  Diagnosis Date  . Allergy   . Diabetes mellitus without complication ([place]) 7829  . Hyperlipidemia   . Hypertension   . Low back pain, episodic   . Numbness of feet   . Vitamin D deficiency     Past Surgical History:  Procedure Laterality Date  . ABDOMINAL HYSTERECTOMY  2010  . APPENDECTOMY  1966  . COLONOSCOPY  06-07-13   Dr Bary Castilla  . DILATION AND CURETTAGE OF UTERUS      Family History  Problem Relation Age of Onset  . Diabetes Mother   . COPD Mother   . Diabetes Father   . Hypertension Father   . Cancer Father        Kidney and Prostate  . CAD Father   . Diabetes Brother        [employer]  . Kidney disease Brother        Tumor removed  . Breast cancer Neg Hx   Her grandfather had a CVA in his 2s.  A niece had an early pregnancy loss.   Social History:  reports that she has never smoked. She has never used smokeless tobacco. She reports that she does not drink alcohol or use drugs.  She does not have any children.  She lives in [place].  The patient is alone today.  Allergies: No Known Allergies  Current Medications: Current Outpatient Prescriptions  Medication Sig  Dispense Refill  . aspirin EC 81 MG tablet Take 81 mg by mouth daily.    Marland Kitchen atorvastatin (LIPITOR) 40 MG tablet Take 1 tablet (40 mg total) by mouth daily. 90 tablet 1  . chlorzoxazone (PARAFON) 500 MG tablet TAKE ONE TABLET BY MOUTH THREE TIMES DAILY 90 tablet 0  . cholecalciferol (VITAMIN D) 1000 units tablet Take 1,000 Units by mouth daily.    . fluticasone (FLONASE) 50 MCG/ACT nasal spray Place 2 sprays into both nostrils as needed.    Marland Kitchen glucose blood (ONE TOUCH ULTRA TEST) test strip Use as instructed 100 each 12  . levocetirizine (XYZAL) 5 MG tablet Take 1 tablet (5 mg total) by mouth daily. 90 tablet 1  . losartan-hydrochlorothiazide (HYZAAR) 100-12.5 MG tablet Take 1 tablet by mouth daily. Did not send rx on 07/24/2016 , she will try taking half 90 tablet 1  . MAGNESIUM OXIDE PO Take 500 mg by mouth daily.     . Menthol, Topical Analgesic, 4 % GEL Apply 2 g topically 4 (four) times daily. 1 Tube 1  . metFORMIN (GLUCOPHAGE-XR) 500 MG 24 hr tablet Take 2 tablets (1,000 mg total) by mouth daily with breakfast. 180 tablet 1  . montelukast (SINGULAIR)  10 MG tablet Take 1 tablet (10 mg total) by mouth at bedtime. 90 tablet 1  . Turmeric 500 MG TABS Take 1 tablet by mouth daily.    . vitamin B-12 (CYANOCOBALAMIN) 1000 MCG tablet Take 1,000 mcg by mouth daily.    . vitamin C (ASCORBIC ACID) 500 MG tablet Take 500 mg by mouth daily.    Alveda Reasons 20 MG TABS tablet TAKE ONE TABLET BY MOUTH ONCE DAILY WITH  SUPPER 30 tablet 5   No current facility-administered medications for this visit.     Review of Systems:  GENERAL:  Feels good.  No fevers or sweats.  Weight up 1 pound since last visit. PERFORMANCE STATUS (ECOG):  0 HEENT:  No visual changes, runny nose, sore throat, mouth sores or tenderness. Lungs: No shortness of breath or cough.  No hemoptysis. Cardiac:  No chest pain, palpitations, orthopnea, or PND.  GI:  No nausea, vomiting, diarrhea, constipation, melena or hematochezia.   Colonoscopy in 06/2013. GU:  No urgency, frequency, dysuria, or hematuria. Musculoskeletal:  No back pain.  RIGHT knee pain secondary to arthritis; in brace  No muscle tenderness. Extremities:  No pain or swelling. Skin:  No rashes or skin changes. Neuro:  No headache, numbness or weakness, balance or coordination issues. Endocrine:  Diabetes (see HPI).  No thyroid issues, hot flashes or night sweats. Psych:  No mood changes, depression or anxiety. Pain:  No focal pain. Review of systems:  All other systems reviewed and found to be negative.  Physical Exam: Blood pressure 134/72, pulse 94, temperature 98 F (36.7 C), temperature source Tympanic, resp. rate 18, weight 237 lb 8 oz (107.7 kg). GENERAL:  Well developed, well nourished, woman sitting comfortably in the exam room in no acute distress. MENTAL STATUS:  Alert and oriented to person, place and time. HEAD:  Short curly red hair.  Normocephalic, atraumatic, face symmetric, no Cushingoid features. EYES:  Blue eyes.  Pupils equal round and reactive to light and accomodation.  No conjunctivitis or scleral icterus. ENT:  Oropharynx clear without lesion.  Tongue normal. Mucous membranes moist.  RESPIRATORY:  Clear to auscultation without rales, wheezes or rhonchi. CARDIOVASCULAR:  Regular rate and rhythm without murmur, rub or gallop. ABDOMEN:  Soft, non-tender, with active bowel sounds, and no appreciable hepatosplenomegaly.  No masses. SKIN:  No rashes, ulcers or lesions. EXTREMITIES: No edema, no skin discoloration or tenderness.  No palpable cords. RIGHT knee pain; arthritis.  LYMPH NODES: No palpable cervical, supraclavicular, axillary or inguinal adenopathy  NEUROLOGICAL: Unremarkable. PSYCH:  Appropriate.   Appointment on 10/30/2016  Component Date Value Ref Range Status  . Sodium 10/30/2016 137  135 - 145 mmol/L Final  . Potassium 10/30/2016 3.6  3.5 - 5.1 mmol/L Final  . Chloride 10/30/2016 101  101 - 111 mmol/L Final  .  CO2 10/30/2016 28  22 - 32 mmol/L Final  . Glucose, Bld 10/30/2016 222* 65 - 99 mg/dL Final  . BUN 10/30/2016 15  6 - 20 mg/dL Final  . Creatinine, Ser 10/30/2016 0.79  0.44 - 1.00 mg/dL Final  . Calcium 10/30/2016 9.4  8.9 - 10.3 mg/dL Final  . Total Protein 10/30/2016 7.2  6.5 - 8.1 g/dL Final  . Albumin 10/30/2016 3.8  3.5 - 5.0 g/dL Final  . AST 10/30/2016 28  15 - 41 U/L Final  . ALT 10/30/2016 23  14 - 54 U/L Final  . Alkaline Phosphatase 10/30/2016 90  38 - 126 U/L Final  . Total Bilirubin  10/30/2016 0.7  0.3 - 1.2 mg/dL Final  . GFR calc non Af Amer 10/30/2016 >60  >60 mL/min Final  . GFR calc Af Amer 10/30/2016 >60  >60 mL/min Final   Comment: (NOTE) The eGFR has been calculated using the CKD EPI equation. This calculation has not been validated in all clinical situations. eGFR's persistently <60 mL/min signify possible Chronic Kidney Disease.   . Anion gap 10/30/2016 8  5 - 15 Final  . WBC 10/30/2016 9.8  3.6 - 11.0 K/uL Final  . RBC 10/30/2016 4.39  3.80 - 5.20 MIL/uL Final  . Hemoglobin 10/30/2016 12.5  12.0 - 16.0 g/dL Final  . HCT 10/30/2016 37.0  35.0 - 47.0 % Final  . MCV 10/30/2016 84.2  80.0 - 100.0 fL Final  . MCH 10/30/2016 28.6  26.0 - 34.0 pg Final  . MCHC 10/30/2016 33.9  32.0 - 36.0 g/dL Final  . RDW 10/30/2016 15.0* 11.5 - 14.5 % Final  . Platelets 10/30/2016 215  150 - 440 K/uL Final  . Neutrophils Relative % 10/30/2016 75  % Final  . Neutro Abs 10/30/2016 7.4* 1.4 - 6.5 K/uL Final  . Lymphocytes Relative 10/30/2016 17  % Final  . Lymphs Abs 10/30/2016 1.7  1.0 - 3.6 K/uL Final  . Monocytes Relative 10/30/2016 7  % Final  . Monocytes Absolute 10/30/2016 0.6  0.2 - 0.9 K/uL Final  . Eosinophils Relative 10/30/2016 1  % Final  . Eosinophils Absolute 10/30/2016 0.1  0 - 0.7 K/uL Final  . Basophils Relative 10/30/2016 0  % Final  . Basophils Absolute 10/30/2016 0.0  0 - 0.1 K/uL Final  Office Visit on 10/30/2016  Component Date Value Ref Range Status  .  Hemoglobin A1C 10/30/2016 6.6   Final    Assessment:  MARNAE MADANI is a 61 y.o. female with portal vein thrombosis presenting with fever, chills, dehydration, weight loss, and abdominal pain.  Symptoms lasted for about 4 weeks.  Abdominal and pelvic CT on 11/07/2014 revealed a partially thrombosed portal veins (left > right).  The main portal vein and mesenteric veins remained patent.  Porta hepatis nodes measured 1.2 cm in short axis (stable).  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  The following studies were normal:  prothrombin gene mutation, lupus anticoagulant panel, anticardiolipin antibodies, homocysteine, protein C antigen (88%), protein C activity (111%), protein S antigen total (131%), protein S antigen free (108%), anti-thrombin III antigen (88%), anti-thrombin III functional (101%).  On 12/11/2014, protein S antigen total (95%), protein S antigen free (77%), and protein S activity (69%) were normal.  PNH by flow cytometry was negative.  JAK2 V617F and exon 12 were negative on 05/02/2015.  Additional testing on 11/27/2014 revealed resolution of anemia.  The following studies were normal:  SPEP, free light chain ratio, 24 hour UPEP, IgG, IgA, ANA, ESR, ferritin, reticulocyte count, B12, and folate.  Serum protein electrophoresis revealed no monoclonal protein.  Kappa free light chains were 26.67, lambda free light chains 24.67 and ratio 1.08 (normal).   IgM was 247 (26-217).    She was started on Xarelto on 11/08/2014.  Plan is for long term anticoagulation given the unprovoked thrombus.  Ultrasound hepatic doppler on 05/02/2015 revealed no convincing evidence of occlusion or thrombus.  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 There was interval reconstitution of previously noted thrombosis within the peripheral branches of the right portal vein.  The right and main portal  veins appeared widely patent.  A discrete hepatic lesion/mass was not identified.  There was no evidence of cirrhosis. There was unchanged borderline splenomegaly without evidence of gastroesophageal varices or ascites.  There was extensive colonic diverticulosis.  She denies any prior history of thrombosis.  She has not been on birth control pills.  She denies any family history of thrombosis.   Screening studies for malignancy were normal.  Chest x-ray on 11/05/2014 was negative.  Bilateral mammogram in 11/14/2015 with follow-up diagnostic left mammogram 11/25/2015 revealed no evidence of malignancy.  Colonoscopy on 06/07/2013 revealed only diverticulosis.    Symptomatically, she feels denies any complaint.  Exam is unremarkable.  Plan: 1.  Labs today:  CBC with diff, CMP. 2.  Continue Xarelto 20 mg a day. 3.  Patient to contact clinic between appointments if any concerns. 4.  RTC in 3 months for labs (CBC with diff, CMP) 5.  RTC in 6 months for MD assessment and labs (CBC with diff, CMP).   Melissa C. Mike Gip, MD  10/30/2016, 1:52 PM

## 2016-10-30 NOTE — Progress Notes (Signed)
Name: Emily Pitts   MRN: [id number]    DOB: [birth year]   Date:10/30/2016       Progress Note  Subjective  Chief Complaint  Chief Complaint  Patient presents with  . Medication Refill  . Diabetes    Checks weekly, Lowest-116 Average-135 Highest-174  . Hypertension  . Hyperlipidemia  . Allergic Rhinitis     Stable  . Knee Pain    HPI  Diabetes Type II with microalbuminuria: she is on Metformin just one pill daily and hgbA1C has gone down from 6.9% to 6.6% and again 6.6% today. She deniesblurred vision, polyphagia, polyuria or polydipsia. Chronic nocturia once per night - she drinks a lot of water in the evenings. Complaint with diabetic diet. Glucose fasting is around 116-174, fasting usually above 140. 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 Walking at work not at home.  Discussed importance of resuming her physical activity , but she has not changed her life style yet. She push mows once a week or less. Needs to be more consistent.   HTN: she is on Losartan/hctz and bp is towards low end of normal, we tried going down on losartan to half a pill daily but patients bp went up to 140's, no longer having dizziness. No chest pain or palpitation    Hyperlipidemia: she is now on Atorvastatin and denies side effects. Denies side effects  GERD: she stopped taking Omeprazole, controlled with life style modification, no heartburn or regurgitation, very seldom has to take Tums  Perennial AR with seasonal variation: Occasional sneezing, rhinorrhea and headaches, worse in the Spring and Fall. Still taking Xyzal, and singulair only prn. Stable at this time   Portal Vein Thrombosis: seen hematologist, taking Xarelto, denies any bleeding. She will see Dr. Mike Gip today . She has elevated factor V leiden, has labs today with Dr. Mike Gip  Patient  Active Problem List   Diagnosis Date Noted  . 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 Long term current use of anticoagulant therapy 01/05/2016  . Factor 5 Leiden mutation, heterozygous ([place]) 05/02/2015  . Portal vein thrombosis 11/07/2014  . History of sepsis 10/10/2014  . Abnormal electrocardiogram 10/08/2014  . Nonspecific abnormal electromyogram (EMG) 10/08/2014  . Benign essential HTN 10/08/2014  . Diabetes mellitus with renal manifestation ([place]) 10/08/2014  . Dyslipidemia 10/08/2014  . LBP (low back pain) 10/08/2014  . Gastro-esophageal reflux disease without esophagitis 10/08/2014  . Microalbuminuria 10/08/2014  . Disturbance of skin sensation 10/08/2014  . Obesity (BMI 30-39.9) 10/08/2014  . Perennial allergic rhinitis with seasonal variation 10/08/2014  . Plantar fasciitis 10/08/2014  . Postablative ovarian failure 10/08/2014  . Menopausal symptom 10/08/2014  . Vitamin D deficiency 10/08/2014    Past Surgical History:  Procedure Laterality Date  . ABDOMINAL HYSTERECTOMY  2010  . APPENDECTOMY  1966  . COLONOSCOPY  06-07-13   Dr Bary Castilla  . DILATION AND CURETTAGE OF UTERUS      Family History  Problem Relation Age of Onset  . Diabetes Mother   . COPD Mother   . Diabetes Father   . Hypertension Father   . Cancer Father        Kidney and Prostate  . CAD Father   . Diabetes Brother        [employer]  . Kidney disease Brother  Tumor removed  . Breast cancer Neg Hx     Social History   Social History  . Marital status: Single    Spouse name: N/A  . Number of children: N/A  . Years of education: N/A   Occupational History  . Not on file.   Social History Main Topics  . Smoking status: Never Smoker  . Smokeless tobacco: Never Used  . Alcohol use No  . Drug use: No  . Sexual activity: No   Other Topics Concern  . Not on file   Social History Narrative  . No narrative on file     Current Outpatient Prescriptions:  .  aspirin EC 81 MG  tablet, Take 81 mg by mouth daily., Disp: , Rfl:  .  atorvastatin (LIPITOR) 40 MG tablet, Take 1 tablet (40 mg total) by mouth daily., Disp: 90 tablet, Rfl: 1 .  chlorzoxazone (PARAFON) 500 MG tablet, TAKE ONE TABLET BY MOUTH THREE TIMES DAILY, Disp: 90 tablet, Rfl: 0 .  cholecalciferol (VITAMIN D) 1000 units tablet, Take 1,000 Units by mouth daily., Disp: , Rfl:  .  fluticasone (FLONASE) 50 MCG/ACT nasal spray, Place 2 sprays into both nostrils as needed., Disp: , Rfl:  .  glucose blood (ONE TOUCH ULTRA TEST) test strip, Use as instructed, Disp: 100 each, Rfl: 12 .  levocetirizine (XYZAL) 5 MG tablet, Take 1 tablet (5 mg total) by mouth daily., Disp: 90 tablet, Rfl: 1 .  losartan-hydrochlorothiazide (HYZAAR) 100-12.5 MG tablet, Take 0.5 tablets by mouth daily. Did not send rx on 07/24/2016 , she will try taking half, Disp: 90 tablet, Rfl: 1 .  MAGNESIUM OXIDE PO, Take 500 mg by mouth daily. , Disp: , Rfl:  .  Menthol, Topical Analgesic, 4 % GEL, Apply 2 g topically 4 (four) times daily., Disp: 1 Tube, Rfl: 1 .  metFORMIN (GLUCOPHAGE-XR) 500 MG 24 hr tablet, Take 1 tablet (500 mg total) by mouth daily with breakfast., Disp: 90 tablet, Rfl: 1 .  montelukast (SINGULAIR) 10 MG tablet, Take 1 tablet (10 mg total) by mouth at bedtime., Disp: 90 tablet, Rfl: 1 .  Turmeric 500 MG TABS, Take 1 tablet by mouth daily., Disp: , Rfl:  .  vitamin C (ASCORBIC ACID) 500 MG tablet, Take 500 mg by mouth daily., Disp: , Rfl:  .  XARELTO 20 MG TABS tablet, TAKE ONE TABLET BY MOUTH ONCE DAILY WITH  SUPPER, Disp: 30 tablet, Rfl: 5 .  vitamin B-12 (CYANOCOBALAMIN) 1000 MCG tablet, Take 1,000 mcg by mouth daily., Disp: , Rfl:   No Known Allergies   ROS  Constitutional: Negative for fever or weight change.  Respiratory: Negative for cough and shortness of breath.   Cardiovascular: Negative for chest pain or palpitations.  Gastrointestinal: Negative for abdominal pain, no bowel changes.  Musculoskeletal: Negative  for gait problem or joint swelling.  Skin: Negative for rash.  Neurological: Negative for dizziness or headache.  No other specific complaints in a complete review of systems (except as listed in HPI above).  Objective  Vitals:   10/30/16 1008  BP: 116/62  Pulse: 89  Resp: 16  Temp: 98 F (36.7 C)  TempSrc: Oral  SpO2: 97%  Weight: 237 lb 12.8 oz (107.9 kg)  Height: 5\' 9"  (1.753 m)    Body mass index is 35.12 kg/m.  Physical Exam  Constitutional: Patient appears well-developed and well-nourished. Obese No distress.  HEENT: head atraumatic, normocephalic, pupils equal and reactive to light,  neck supple, throat within normal  limits Cardiovascular: Normal rate, regular rhythm and normal heart sounds.  No murmur heard. No BLE edema. Pulmonary/Chest: Effort normal and breath sounds normal. No respiratory distress. Abdominal: Soft.  There is no tenderness. Psychiatric: Patient has a normal mood and affect. behavior is normal. Judgment and thought content normal. Muscular Skeletal : crepitus with extension of both knees, brace on right knee  Recent Results (from the past 2160 hour(s))  POCT HgB A1C     Status: None   Collection Time: 10/30/16 10:12 AM  Result Value Ref Range   Hemoglobin A1C 6.6      PHQ2/9: Depression screen [facility] 2/9 10/30/2016 04/30/2016 02/03/2016 10/30/2015 08/15/2015  Decreased Interest 0 0 0 0 0  Down, Depressed, Hopeless 0 0 0 0 0  PHQ - 2 Score 0 0 0 0 0     Fall Risk: Fall Risk  10/30/2016 04/30/2016 02/03/2016 10/30/2015 08/15/2015  Falls in the past year? No No No No Yes  Number falls in past yr: - - - - 1  Injury with Fall? - - - - No    Functional Status Survey: Is the patient deaf or have difficulty hearing?: No Does the patient have difficulty seeing, even when wearing glasses/contacts?: No Does the patient have difficulty concentrating, remembering, or making decisions?: No Does the patient have difficulty walking or climbing stairs?:  No Does the patient have difficulty dressing or bathing?: No Does the patient have difficulty doing errands alone such as visiting a doctor's office or shopping?: No   Assessment & Plan  1. Type 2 diabetes mellitus with microalbuminuria, without long-term current use of insulin ([facility])  - POCT HgB A1C - metFORMIN (GLUCOPHAGE-XR) 500 MG 24 hr tablet; Take 2 tablet (1000 mg total) by mouth daily with breakfast.  Dispense: 90 tablet; Refill: 1 - losartan-hydrochlorothiazide (HYZAAR) 100-12.5 MG tablet; Take 0.5 tablets by mouth daily. Did not send rx on 07/24/2016 , she will try taking half  Dispense: 90 tablet; Refill: 1 - glucose blood (ONE TOUCH ULTRA TEST) test strip; Use as instructed  Dispense: 100 each; Refill: 12  2. Factor 5 Leiden mutation, heterozygous ([place])  Continue Xarelto and follow up with Dr. Mike Gip   3. Chondromalacia, right knee  Seeing Dr. Sabra Heck, wearing a brace, had steroid injection and she will follow up with him   4. Benign essential HTN  - losartan-hydrochlorothiazide (HYZAAR) 100-12.5 MG tablet; Take 1 tablets by mouth daily. Did not send rx on 07/24/2016 , she will try taking half  Dispense: 90 tablet; Refill: 1  5. Dyslipidemia  Continue statin   6. Perennial allergic rhinitis with seasonal variation  stable  7. Gastro-esophageal reflux disease without esophagitis  Doing well, off medication, occasionally takes Tums  8. Vitamin D deficiency  Continue otc vitamin D

## 2017-01-07 ENCOUNTER — Other Ambulatory Visit: Payer: Self-pay | Admitting: Family Medicine

## 2017-01-07 DIAGNOSIS — Z1231 Encounter for screening mammogram for malignant neoplasm of breast: Secondary | ICD-10-CM

## 2017-01-22 ENCOUNTER — Inpatient Hospital Stay: Payer: BC Managed Care – PPO | Attending: Hematology and Oncology

## 2017-01-22 DIAGNOSIS — E119 Type 2 diabetes mellitus without complications: Secondary | ICD-10-CM | POA: Insufficient documentation

## 2017-01-22 DIAGNOSIS — E785 Hyperlipidemia, unspecified: Secondary | ICD-10-CM | POA: Diagnosis not present

## 2017-01-22 DIAGNOSIS — D6851 Activated protein C resistance: Secondary | ICD-10-CM | POA: Diagnosis not present

## 2017-01-22 DIAGNOSIS — E559 Vitamin D deficiency, unspecified: Secondary | ICD-10-CM | POA: Diagnosis not present

## 2017-01-22 DIAGNOSIS — I81 Portal vein thrombosis: Secondary | ICD-10-CM

## 2017-01-22 DIAGNOSIS — I10 Essential (primary) hypertension: Secondary | ICD-10-CM | POA: Diagnosis not present

## 2017-01-22 DIAGNOSIS — Z7901 Long term (current) use of anticoagulants: Secondary | ICD-10-CM | POA: Diagnosis not present

## 2017-01-22 DIAGNOSIS — Z7982 Long term (current) use of aspirin: Secondary | ICD-10-CM | POA: Diagnosis not present

## 2017-01-22 DIAGNOSIS — R161 Splenomegaly, not elsewhere classified: Secondary | ICD-10-CM | POA: Diagnosis not present

## 2017-01-22 DIAGNOSIS — Z7984 Long term (current) use of oral hypoglycemic drugs: Secondary | ICD-10-CM | POA: Insufficient documentation

## 2017-01-22 LAB — COMPREHENSIVE METABOLIC PANEL
ALT: 23 U/L (ref 14–54)
AST: 21 U/L (ref 15–41)
Albumin: 4.1 g/dL (ref 3.5–5.0)
Alkaline Phosphatase: 90 U/L (ref 38–126)
Anion gap: 9 (ref 5–15)
BUN: 12 mg/dL (ref 6–20)
CO2: 28 mmol/L (ref 22–32)
Calcium: 9.6 mg/dL (ref 8.9–10.3)
Chloride: 99 mmol/L — ABNORMAL LOW (ref 101–111)
Creatinine, Ser: 0.69 mg/dL (ref 0.44–1.00)
GFR calc Af Amer: >60 mL/min (ref >60)
GFR calc non Af Amer: >60 mL/min (ref >60)
Glucose, Bld: 136 mg/dL — ABNORMAL HIGH (ref 65–99)
Potassium: 4.3 mmol/L (ref 3.5–5.1)
Sodium: 136 mmol/L (ref 135–145)
Total Bilirubin: 0.6 mg/dL (ref 0.3–1.2)
Total Protein: 7.4 g/dL (ref 6.5–8.1)

## 2017-01-22 LAB — CBC WITH DIFFERENTIAL/PLATELET
Basophils Absolute: 0.1 10*3/uL (ref 0–0.1)
Basophils Relative: 1 %
Eosinophils Absolute: 0.1 10*3/uL (ref 0–0.7)
Eosinophils Relative: 2 %
HCT: 38 % (ref 35.0–47.0)
Hemoglobin: 12.5 g/dL (ref 12.0–16.0)
Lymphocytes Relative: 25 %
Lymphs Abs: 2.4 10*3/uL (ref 1.0–3.6)
MCH: 27.9 pg (ref 26.0–34.0)
MCHC: 32.9 g/dL (ref 32.0–36.0)
MCV: 84.7 fL (ref 80.0–100.0)
Monocytes Absolute: 0.8 10*3/uL (ref 0.2–0.9)
Monocytes Relative: 9 %
Neutro Abs: 6.3 10*3/uL (ref 1.4–6.5)
Neutrophils Relative %: 65 %
Platelets: 211 10*3/uL (ref 150–440)
RBC: 4.49 MIL/uL (ref 3.80–5.20)
RDW: 15.3 % — ABNORMAL HIGH (ref 11.5–14.5)
WBC: 9.7 10*3/uL (ref 3.6–11.0)

## 2017-01-27 ENCOUNTER — Ambulatory Visit
Admission: RE | Admit: 2017-01-27 | Discharge: 2017-01-27 | Disposition: A | Discharge status: Home / Self Care | Payer: BC Managed Care – PPO | Source: Ambulatory Visit | Attending: Family Medicine | Admitting: Family Medicine

## 2017-01-27 DIAGNOSIS — Z1231 Encounter for screening mammogram for malignant neoplasm of breast: Secondary | ICD-10-CM

## 2017-02-01 ENCOUNTER — Other Ambulatory Visit: Payer: BC Managed Care – PPO

## 2017-02-26 ENCOUNTER — Ambulatory Visit: Payer: BC Managed Care – PPO | Admitting: Family Medicine

## 2017-02-26 ENCOUNTER — Encounter: Payer: Self-pay | Admitting: Family Medicine

## 2017-02-26 VITALS — BP 130/70 | HR 78 | Resp 14 | Ht 69.0 in | Wt 230.2 lb

## 2017-02-26 DIAGNOSIS — J302 Other seasonal allergic rhinitis: Secondary | ICD-10-CM | POA: Diagnosis not present

## 2017-02-26 DIAGNOSIS — J3089 Other allergic rhinitis: Secondary | ICD-10-CM

## 2017-02-26 DIAGNOSIS — K5900 Constipation, unspecified: Secondary | ICD-10-CM

## 2017-02-26 DIAGNOSIS — R809 Proteinuria, unspecified: Secondary | ICD-10-CM | POA: Diagnosis not present

## 2017-02-26 DIAGNOSIS — Z23 Encounter for immunization: Secondary | ICD-10-CM

## 2017-02-26 DIAGNOSIS — I10 Essential (primary) hypertension: Secondary | ICD-10-CM

## 2017-02-26 DIAGNOSIS — E1129 Type 2 diabetes mellitus with other diabetic kidney complication: Secondary | ICD-10-CM

## 2017-02-26 DIAGNOSIS — E785 Hyperlipidemia, unspecified: Secondary | ICD-10-CM

## 2017-02-26 LAB — LIPID PANEL
Cholesterol: 122 mg/dL (ref <200)
HDL: 53 mg/dL (ref >50)
LDL Cholesterol (Calc): 50 mg/dL (calc)
NON-HDL CHOLESTEROL (CALC): 69 mg/dL (ref <130)
Total CHOL/HDL Ratio: 2.3 (calc) (ref <5.0)
Triglycerides: 102 mg/dL (ref <150)

## 2017-02-26 LAB — COMPLETE METABOLIC PANEL WITH GFR
AG RATIO: 1.6 (calc) (ref 1.0–2.5)
ALT: 19 U/L (ref 6–29)
AST: 16 U/L (ref 10–35)
Albumin: 4.3 g/dL (ref 3.6–5.1)
Alkaline phosphatase (APISO): 84 U/L (ref 33–130)
BUN: 13 mg/dL (ref 7–25)
CALCIUM: 9.9 mg/dL (ref 8.6–10.4)
CO2: 31 mmol/L (ref 20–32)
Chloride: 101 mmol/L (ref 98–110)
Creat: 0.75 mg/dL (ref 0.50–0.99)
GFR, EST AFRICAN AMERICAN: 99 mL/min/{1.73_m2} (ref >=60)
GFR, EST NON AFRICAN AMERICAN: 85 mL/min/{1.73_m2} (ref >=60)
GLOBULIN: 2.7 g/dL (ref 1.9–3.7)
Glucose, Bld: 96 mg/dL (ref 65–99)
POTASSIUM: 4.5 mmol/L (ref 3.5–5.3)
SODIUM: 140 mmol/L (ref 135–146)
TOTAL PROTEIN: 7 g/dL (ref 6.1–8.1)
Total Bilirubin: 0.5 mg/dL (ref 0.2–1.2)

## 2017-02-26 LAB — CBC WITH DIFFERENTIAL/PLATELET
BASOS PCT: 0.3 %
Basophils Absolute: 27 cells/uL (ref 0–200)
EOS PCT: 1.3 %
Eosinophils Absolute: 117 cells/uL (ref 15–500)
HCT: 36.6 % (ref 35.0–45.0)
HEMOGLOBIN: 12.4 g/dL (ref 11.7–15.5)
Lymphs Abs: 2880 cells/uL (ref 850–3900)
MCH: 27.7 pg (ref 27.0–33.0)
MCHC: 33.9 g/dL (ref 32.0–36.0)
MCV: 81.9 fL (ref 80.0–100.0)
MONOS PCT: 8.3 %
MPV: 10.5 fL (ref 7.5–12.5)
NEUTROS ABS: 5229 {cells}/uL (ref 1500–7800)
Neutrophils Relative %: 58.1 %
Platelets: 217 10*3/uL (ref 140–400)
RBC: 4.47 10*6/uL (ref 3.80–5.10)
RDW: 14.4 % (ref 11.0–15.0)
Total Lymphocyte: 32 %
WBC mixed population: 747 cells/uL (ref 200–950)
WBC: 9 10*3/uL (ref 3.8–10.8)

## 2017-02-26 LAB — TSH: TSH: 0.56 m[IU]/L (ref 0.40–4.50)

## 2017-02-26 LAB — POCT GLYCOSYLATED HEMOGLOBIN (HGB A1C): Hemoglobin A1C: 6.6

## 2017-02-26 MED ORDER — ATORVASTATIN CALCIUM 40 MG PO TABS: 40.0000 mg | ORAL_TABLET | Freq: Every day | ORAL | 1 refills | Status: DC

## 2017-02-26 MED ORDER — LEVOCETIRIZINE DIHYDROCHLORIDE 5 MG PO TABS: 5.0000 mg | ORAL_TABLET | Freq: Every day | ORAL | 1 refills | Status: DC

## 2017-02-26 MED ORDER — METFORMIN HCL ER 500 MG PO TB24: 1000.0000 mg | ORAL_TABLET | Freq: Every day | ORAL | 1 refills | Status: DC

## 2017-02-26 MED ORDER — MONTELUKAST SODIUM 10 MG PO TABS: 10.0000 mg | ORAL_TABLET | Freq: Every day | ORAL | 1 refills | Status: DC

## 2017-02-26 MED ORDER — LOSARTAN POTASSIUM-HCTZ 100-12.5 MG PO TABS
1.0000 | ORAL_TABLET | Freq: Every day | ORAL | 1 refills | Status: DC
Start: 2017-02-26 — End: 2017-05-13

## 2017-02-26 MED ORDER — POLYETHYLENE GLYCOL 3350 17 GM/SCOOP PO POWD: 17.0000 g | Freq: Two times a day (BID) | ORAL | 1 refills | Status: AC

## 2017-02-26 NOTE — Patient Instructions (Signed)
High-Fiber Diet  Fiber, also called dietary fiber, is a type of carbohydrate found in fruits, vegetables, whole grains, and beans. A high-fiber diet can have many health benefits. Your health care provider may recommend a high-fiber diet to help:  · Prevent constipation. Fiber can make your bowel movements more regular.  · Lower your cholesterol.  · Relieve hemorrhoids, uncomplicated diverticulosis, or irritable bowel syndrome.  · Prevent overeating as part of a weight-loss plan.  · Prevent heart disease, type 2 diabetes, and certain cancers.    What is my plan?  The recommended daily intake of fiber includes:  · 38 grams for men under age 50.  · 30 grams for men over age 50.  · 25 grams for women under age 50.  · 21 grams for women over age 50.    You can get the recommended daily intake of dietary fiber by eating a variety of fruits, vegetables, grains, and beans. Your health care provider may also recommend a fiber supplement if it is not possible to get enough fiber through your diet.  What do I need to know about a high-fiber diet?  · Fiber supplements have not been widely studied for their effectiveness, so it is better to get fiber through food sources.  · Always check the fiber content on the nutrition facts label of any prepackaged food. Look for foods that contain at least 5 grams of fiber per serving.  · Ask your dietitian if you have questions about specific foods that are related to your condition, especially if those foods are not listed in the following section.  · Increase your daily fiber consumption gradually. Increasing your intake of dietary fiber too quickly may cause bloating, cramping, or gas.  · Drink plenty of water. Water helps you to digest fiber.  What foods can I eat?  Grains  Whole-grain breads. Multigrain cereal. Oats and oatmeal. Brown rice. Barley. Bulgur wheat. Millet. Bran muffins. Popcorn. Rye wafer crackers.  Vegetables   Sweet potatoes. Spinach. Kale. Artichokes. Cabbage. Broccoli. Green peas. Carrots. Squash.  Fruits  Berries. Pears. Apples. Oranges. Avocados. Prunes and raisins. Dried figs.  Meats and Other Protein Sources  [place]y, kidney, pinto, and soy beans. Split peas. Lentils. Nuts and seeds.  Dairy  Fiber-fortified yogurt.  Beverages  Fiber-fortified soy milk. Fiber-fortified orange juice.  Other  Fiber bars.  The items listed above may not be a complete list of recommended foods or beverages. Contact your dietitian for more options.  What foods are not recommended?  Grains  White bread. Pasta made with refined flour. White rice.  Vegetables  Fried potatoes. Canned vegetables. Well-cooked vegetables.  Fruits  Fruit juice. Cooked, strained fruit.  Meats and Other Protein Sources  Fatty cuts of meat. Fried poultry or fried fish.  Dairy  Milk. Yogurt. Cream cheese. Sour cream.  Beverages  Soft drinks.  Other  Cakes and pastries. Butter and oils.  The items listed above may not be a complete list of foods and beverages to avoid. Contact your dietitian for more information.  What are some tips for including high-fiber foods in my diet?  · Eat a wide variety of high-fiber foods.  · Make sure that half of all grains consumed each day are whole grains.  · Replace breads and cereals made from refined flour or white flour with whole-grain breads and cereals.  · Replace white rice with brown rice, bulgur wheat, or millet.  · Start the day with a breakfast that is high in fiber,   such as a cereal that contains at least 5 grams of fiber per serving.  · Use beans in place of meat in soups, salads, or pasta.  · Eat high-fiber snacks, such as berries, raw vegetables, nuts, or popcorn.  This information is not intended to replace advice given to you by your health care provider. Make sure you discuss any questions you have with your health care provider.  Document Released: 02/23/2005 Document Revised: 08/01/2015 Document Reviewed: 08/08/2013   [employer] Interactive Patient Education © 2018 [employer]

## 2017-02-26 NOTE — Progress Notes (Signed)
Name: Emily Pitts   MRN: [id number]    DOB: 1955/10/21   Date:02/26/2017       Progress Note  Subjective  Chief Complaint  Chief Complaint  Patient presents with  . Follow-up  . Constipation    HPI  Diabetes Type II with microalbuminuria: she is on Metformin two pills daily hgbA1C 6.9% to  to 6.6%, 6.6% today.She deniesblurred vision, polyphagia, polyuria or polydipsia. Chronic nocturia once per night - she drinks a lot of water in the evenings. Complaint with diabetic diet. Glucose fasting is around 140's.   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 She has been busy lately cooking for others and wrapping presents. Lost 8 lbs since last visit.   HTN: she is on Losartan/hctz, she is taking one pill daily and bp is at goal today, no chest pain, palpitation or SOB   Hyperlipidemia: she is now on Atorvastatin and denies side effects. Denies side effects, we will recheck labs  GERD: she stopped taking Omeprazole, controlled with life style modification, no heartburn or regurgitation, very seldom has to take Tums  Perennial AR with seasonal variation: Occasional sneezing, rhinorrhea and headaches, worse in the Spring and Fall. Still taking Xyzal, and singulair only prn. Stable at this time, needs some refills.   Portal Vein Thrombosis: seen hematologist, taking Xarelto, denies any bleeding. She will see Dr. Mike Gip today . She has elevated factor V leiden, has labs today with Dr. Mike Gip  Pain right shoulder blade: pain is sharp, lasts seconds, started about one month ago, intermittent, random times, it can be from a couple of times a day to a couple of times a week, no associated with deep inspiration and not sure of triggers. Never wakes her up at night. No radiation, not associated with chest pain or diaphoresis, but pain is intense and makes her gasp for air  Constipation: she has history of intermittent constipation and  usually responds to Miralax or other otc medications, she states over the past few weeks has noticed worsening of symptoms, passing flatus without problems. She states stools were Bristol Scale 1 and today had three episodes of Bristol scale #5, but not large amount. She has noticed lower abdominal fullness, no blood in stools, no nausea or vomiting, she has to strain at times. She has not been eating a lot of fiber lately.     Patient Active Problem List   Diagnosis Date Noted  . 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 Long term current use of anticoagulant therapy 01/05/2016  . Factor 5 Leiden mutation, heterozygous ([place]) 05/02/2015  . Portal vein thrombosis 11/07/2014  . History of sepsis 10/10/2014  . Abnormal electrocardiogram 10/08/2014  . Nonspecific abnormal electromyogram (EMG) 10/08/2014  . Benign essential HTN 10/08/2014  . Diabetes mellitus with renal manifestation ([place]) 10/08/2014  . Dyslipidemia 10/08/2014  . LBP (low back pain) 10/08/2014  . Gastro-esophageal reflux disease without esophagitis 10/08/2014  . Microalbuminuria 10/08/2014  . Disturbance of skin sensation 10/08/2014  . Obesity (BMI 30-39.9) 10/08/2014  . Perennial allergic rhinitis with seasonal variation 10/08/2014  . Plantar fasciitis 10/08/2014  . Postablative ovarian failure 10/08/2014  . Menopausal symptom 10/08/2014  . Vitamin D deficiency 10/08/2014    Past Surgical History:  Procedure Laterality Date  . ABDOMINAL HYSTERECTOMY  2010  . APPENDECTOMY  1966  . COLONOSCOPY  06-07-13   Dr Bary Castilla  . DILATION AND CURETTAGE OF UTERUS      Family  History  Problem Relation Age of Onset  . Diabetes Mother   . COPD Mother   . Diabetes Father   . Hypertension Father   . Cancer Father        Kidney and Prostate  . CAD Father   . Diabetes Brother        [employer]  . Kidney disease Brother        Tumor removed  . Breast cancer Neg Hx     Social History   Socioeconomic History  . Marital  status: Single    Spouse name: Not on file  . Number of children: Not on file  . Years of education: Not on file  . Highest education level: Not on file  Social Needs  . Financial resource strain: Not on file  . Food insecurity - worry: Not on file  . Food insecurity - inability: Not on file  . Transportation needs - medical: Not on file  . Transportation needs - non-medical: Not on file  Occupational History  . Not on file  Tobacco Use  . Smoking status: Never Smoker  . Smokeless tobacco: Never Used  Substance and Sexual Activity  . Alcohol use: No    Alcohol/week: 0.0 oz  . Drug use: No  . Sexual activity: No  Other Topics Concern  . Not on file  Social History Narrative  . Not on file     Current Outpatient Medications:  .  aspirin EC 81 MG tablet, Take 81 mg by mouth daily., Disp: , Rfl:  .  atorvastatin (LIPITOR) 40 MG tablet, Take 1 tablet (40 mg total) by mouth daily., Disp: 90 tablet, Rfl: 1 .  cholecalciferol (VITAMIN D) 1000 units tablet, Take 1,000 Units by mouth daily., Disp: , Rfl:  .  fluticasone (FLONASE) 50 MCG/ACT nasal spray, Place 2 sprays into both nostrils as needed., Disp: , Rfl:  .  glucose blood (ONE TOUCH ULTRA TEST) test strip, Use as instructed, Disp: 100 each, Rfl: 12 .  levocetirizine (XYZAL) 5 MG tablet, Take 1 tablet (5 mg total) by mouth daily., Disp: 90 tablet, Rfl: 1 .  losartan-hydrochlorothiazide (HYZAAR) 100-12.5 MG tablet, Take 1 tablet by mouth daily. Did not send rx on 07/24/2016 , she will try taking half, Disp: 90 tablet, Rfl: 1 .  MAGNESIUM OXIDE PO, Take 500 mg by mouth daily. , Disp: , Rfl:  .  Menthol, Topical Analgesic, 4 % GEL, Apply 2 g topically 4 (four) times daily., Disp: 1 Tube, Rfl: 1 .  metFORMIN (GLUCOPHAGE-XR) 500 MG 24 hr tablet, Take 2 tablets (1,000 mg total) by mouth daily with breakfast., Disp: 180 tablet, Rfl: 1 .  montelukast (SINGULAIR) 10 MG tablet, Take 1 tablet (10 mg total) by mouth at bedtime., Disp: 90  tablet, Rfl: 1 .  Turmeric 500 MG TABS, Take 1 tablet by mouth daily., Disp: , Rfl:  .  vitamin C (ASCORBIC ACID) 500 MG tablet, Take 500 mg by mouth daily., Disp: , Rfl:  .  XARELTO 20 MG TABS tablet, TAKE ONE TABLET BY MOUTH ONCE DAILY WITH  SUPPER, Disp: 30 tablet, Rfl: 5 .  chlorzoxazone (PARAFON) 500 MG tablet, TAKE ONE TABLET BY MOUTH THREE TIMES DAILY (Patient not taking: Reported on 02/26/2017), Disp: 90 tablet, Rfl: 0 .  glucose blood test strip, Inject 1 each into the skin daily., Disp: , Rfl:  .  polyethylene glycol powder (GLYCOLAX/MIRALAX) powder, Take 17 g by mouth 2 (two) times daily., Disp: 3350 g, Rfl: 1  No Known Allergies   ROS  Constitutional: Negative for fever or weight change.  Respiratory: Negative for cough and shortness of breath.   Cardiovascular: Negative for chest pain or palpitations.  Gastrointestinal: Negative for abdominal pain, positive for  bowel changes.  Musculoskeletal: Negative for gait problem or joint swelling.  Skin: Negative for rash.  Neurological: Negative for dizziness or headache.  No other specific complaints in a complete review of systems (except as listed in HPI above).  Objective  Vitals:   02/26/17 1538  BP: 130/70  Pulse: 78  Resp: 14  SpO2: 98%  Weight: 230 lb 3.2 oz (104.4 kg)  Height: 5' 9"  (1.753 m)    Body mass index is 33.99 kg/m.  Physical Exam  Constitutional: Patient appears well-developed and well-nourished. Obese No distress.  HEENT: head atraumatic, normocephalic, pupils equal and reactive to light, neck supple, throat within normal limits Cardiovascular: Normal rate, regular rhythm and normal heart sounds.  No murmur heard. No BLE edema. Pulmonary/Chest: Effort normal and breath sounds normal. No respiratory distress. Abdominal: Soft.  There is no tenderness. Normal bowel sounds.  Psychiatric: Patient has a normal mood and affect. behavior is normal. Judgment and thought content normal.  Recent Results  (from the past 2160 hour(s))  Comprehensive metabolic panel     Status: Abnormal   Collection Time: 01/22/17  3:48 PM  Result Value Ref Range   Sodium 136 135 - 145 mmol/L   Potassium 4.3 3.5 - 5.1 mmol/L   Chloride 99 (L) 101 - 111 mmol/L   CO2 28 22 - 32 mmol/L   Glucose, Bld 136 (H) 65 - 99 mg/dL   BUN 12 6 - 20 mg/dL   Creatinine, Ser 0.69 0.44 - 1.00 mg/dL   Calcium 9.6 8.9 - 10.3 mg/dL   Total Protein 7.4 6.5 - 8.1 g/dL   Albumin 4.1 3.5 - 5.0 g/dL   AST 21 15 - 41 U/L   ALT 23 14 - 54 U/L   Alkaline Phosphatase 90 38 - 126 U/L   Total Bilirubin 0.6 0.3 - 1.2 mg/dL   GFR calc non Af Amer >60 >60 mL/min   GFR calc Af Amer >60 >60 mL/min    Comment: (NOTE) The eGFR has been calculated using the CKD EPI equation. This calculation has not been validated in all clinical situations. eGFR's persistently <60 mL/min signify possible Chronic Kidney Disease.    Anion gap 9 5 - 15  CBC with Differential/Platelet     Status: Abnormal   Collection Time: 01/22/17  3:48 PM  Result Value Ref Range   WBC 9.7 3.6 - 11.0 K/uL   RBC 4.49 3.80 - 5.20 MIL/uL   Hemoglobin 12.5 12.0 - 16.0 g/dL   HCT 38.0 35.0 - 47.0 %   MCV 84.7 80.0 - 100.0 fL   MCH 27.9 26.0 - 34.0 pg   MCHC 32.9 32.0 - 36.0 g/dL   RDW 15.3 (H) 11.5 - 14.5 %   Platelets 211 150 - 440 K/uL   Neutrophils Relative % 65 %   Neutro Abs 6.3 1.4 - 6.5 K/uL   Lymphocytes Relative 25 %   Lymphs Abs 2.4 1.0 - 3.6 K/uL   Monocytes Relative 9 %   Monocytes Absolute 0.8 0.2 - 0.9 K/uL   Eosinophils Relative 2 %   Eosinophils Absolute 0.1 0 - 0.7 K/uL   Basophils Relative 1 %   Basophils Absolute 0.1 0 - 0.1 K/uL  POCT HgB A1C  Status: Abnormal   Collection Time: 02/26/17  3:44 PM  Result Value Ref Range   Hemoglobin A1C 6.6     PHQ2/9: Depression screen [facility] 2/9 10/30/2016 04/30/2016 02/03/2016 10/30/2015 08/15/2015  Decreased Interest 0 0 0 0 0  Down, Depressed, Hopeless 0 0 0 0 0  PHQ - 2 Score 0 0 0 0 0     Fall  Risk: Fall Risk  02/26/2017 10/30/2016 04/30/2016 02/03/2016 10/30/2015  Falls in the past year? No No No No No  Number falls in past yr: - - - - -  Injury with Fall? - - - - -     Functional Status Survey: Is the patient deaf or have difficulty hearing?: No Does the patient have difficulty seeing, even when wearing glasses/contacts?: No Does the patient have difficulty concentrating, remembering, or making decisions?: No Does the patient have difficulty walking or climbing stairs?: No Does the patient have difficulty dressing or bathing?: No Does the patient have difficulty doing errands alone such as visiting a doctor's office or shopping?: No    Assessment & Plan  1. Type 2 diabetes mellitus with microalbuminuria, without long-term current use of insulin (HCC)  - POCT HgB A1C - losartan-hydrochlorothiazide (HYZAAR) 100-12.5 MG tablet; Take 1 tablet by mouth daily. Did not send rx on 07/24/2016 , she will try taking half  Dispense: 90 tablet; Refill: 1 - metFORMIN (GLUCOPHAGE-XR) 500 MG 24 hr tablet; Take 2 tablets (1,000 mg total) by mouth daily with breakfast.  Dispense: 180 tablet; Refill: 1  2. Flu vaccine need  At work today   3. Dyslipidemia  - atorvastatin (LIPITOR) 40 MG tablet; Take 1 tablet (40 mg total) by mouth daily.  Dispense: 90 tablet; Refill: 1 - Lipid panel  4. Benign essential HTN  - losartan-hydrochlorothiazide (HYZAAR) 100-12.5 MG tablet; Take 1 tablet by mouth daily. Did not send rx on 07/24/2016 , she will try taking half  Dispense: 90 tablet; Refill: 1 - COMPLETE METABOLIC PANEL WITH GFR  5. Perennial allergic rhinitis with seasonal variation  - montelukast (SINGULAIR) 10 MG tablet; Take 1 tablet (10 mg total) by mouth at bedtime.  Dispense: 90 tablet; Refill: 1 - levocetirizine (XYZAL) 5 MG tablet; Take 1 tablet (5 mg total) by mouth daily.  Dispense: 90 tablet; Refill: 1  6. Constipation, unspecified constipation type  Intermittent but worse  over the past few weeks, we will check labs and try miralax discussed high fiber diet and call back if no improvement of symptoms  - TSH - CBC with Differential/Platelet - polyethylene glycol powder (GLYCOLAX/MIRALAX) powder; Take 17 g by mouth 2 (two) times daily.  Dispense: 3350 g; Refill: 1

## 2017-03-29 ENCOUNTER — Encounter: Payer: Self-pay | Admitting: Family Medicine

## 2017-03-29 ENCOUNTER — Ambulatory Visit (INDEPENDENT_AMBULATORY_CARE_PROVIDER_SITE_OTHER): Payer: BC Managed Care – PPO | Admitting: Family Medicine

## 2017-03-29 VITALS — BP 120/70 | HR 106 | Temp 98.3°F | Resp 14 | Ht 69.0 in | Wt 223.2 lb

## 2017-03-29 DIAGNOSIS — Z01419 Encounter for gynecological examination (general) (routine) without abnormal findings: Secondary | ICD-10-CM

## 2017-03-29 DIAGNOSIS — J069 Acute upper respiratory infection, unspecified: Secondary | ICD-10-CM

## 2017-03-29 MED ORDER — HYDROCOD POLST-CPM POLST ER 10-8 MG/5ML PO SUER: 5.0000 mL | Freq: Two times a day (BID) | ORAL | 0 refills | Status: DC | PRN

## 2017-03-29 NOTE — Progress Notes (Signed)
Name: Emily Pitts   MRN: [id number]    DOB: 1955/08/18   Date:03/29/2017       Progress Note  Subjective  Chief Complaint  Chief Complaint  Patient presents with  . Annual Exam    HPI   Patient presents for annual CPE and URI  URI: she states she woke up two days ago with a sore throat, followed by rhinorrhea, nasal congestion and now a dry cough, but occasionally wet, no fever but has chills. She has noticed mild decrease in appetite, no diarrhea, nausea or vomiting. She is tired of coughing.   Diet: eating healthier and losing weight    USPSTF grade A and B recommendations  Depression:  Depression screen [facility] 2/9 03/29/2017 10/30/2016 04/30/2016 02/03/2016 10/30/2015  Decreased Interest 0 0 0 0 0  Down, Depressed, Hopeless 0 0 0 0 0  PHQ - 2 Score 0 0 0 0 0   Hypertension: BP Readings from Last 3 Encounters:  03/29/17 120/70  02/26/17 130/70  10/30/16 134/72   Obesity: Wt Readings from Last 3 Encounters:  03/29/17 223 lb 3.2 oz (101.2 kg)  02/26/17 230 lb 3.2 oz (104.4 kg)  10/30/16 237 lb 8 oz (107.7 kg)   BMI Readings from Last 3 Encounters:  03/29/17 32.96 kg/m  02/26/17 33.99 kg/m  10/30/16 35.07 kg/m       STD testing and prevention (chl/gon/syphilis): N/A Intimate partner violence:negative screen  Sexual History/Pain during Intercourse: not currently - in years Menstrual History/LMP/Abnormal Bleeding: s/p hysterectomy for bleeding, still has cervix Incontinence Symptoms: she has stress incontinence but mild     Advanced Care Planning: A voluntary discussion about advance care planning including the explanation and discussion of advance directives.  Discussed health care proxy and Living will, and the patient was able to identify a health care proxy as her niece Emily Pitts [phone] )   Patient does not have a living will at present time. If patient does have living will, I have requested they bring this to the clinic to be scanned in to  their chart.  Breast cancer:  HM Mammogram  Date Value Ref Range Status  09/20/2014 Normal, [facility]  Final     Cervical cancer screening: up to date 2017   Osteoporosis:  HM Dexa Scan  Date Value Ref Range Status  02/17/2010 Normal, 0.6/0.3  Final     Lipids:  Lab Results  Component Value Date   CHOL 122 02/26/2017   CHOL 123 04/30/2016   CHOL 124 05/02/2015   Lab Results  Component Value Date   HDL 53 02/26/2017   HDL 55 04/30/2016   HDL 68 05/02/2015   Lab Results  Component Value Date   LDLCALC 47 04/30/2016   LDLCALC 39 05/02/2015   LDLCALC 64 04/20/2014   Lab Results  Component Value Date   TRIG 102 02/26/2017   TRIG 104 04/30/2016   TRIG 85 05/02/2015   Lab Results  Component Value Date   CHOLHDL 2.3 02/26/2017   CHOLHDL 2.2 04/30/2016   CHOLHDL 1.8 05/02/2015   No results found for: LDLDIRECT  Glucose:  Glucose, Bld  Date Value Ref Range Status  02/26/2017 96 65 - 99 mg/dL Final    Comment:    .            Fasting reference interval .   01/22/2017 136 (H) 65 - 99 mg/dL Final  10/30/2016 222 (H) 65 - 99 mg/dL Final   Glucose-Capillary  Date Value Ref  Range Status  10/25/2014 113 (H) 65 - 99 mg/dL Final  10/24/2014 82 65 - 99 mg/dL Final  10/24/2014 86 65 - 99 mg/dL Final    Skin cancer: no lesions Colorectal cancer: due in 2025 Lung cancer: Low Dose CT Chest recommended if Age 36-80 years, 30 pack-year currently smoking OR have quit w/in 15years. Patient does not qualify.   Aspirin: on aspirin and Xarelto  ECG:2016    Patient Active Problem List   Diagnosis Date Noted  . 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 Long term current use of anticoagulant therapy 01/05/2016  . Factor 5 Leiden mutation, heterozygous ([place]) 05/02/2015  . Portal vein thrombosis 11/07/2014  . History of sepsis 10/10/2014  . Abnormal electrocardiogram 10/08/2014  . Nonspecific abnormal electromyogram (EMG) 10/08/2014  . Benign essential HTN 10/08/2014   . Diabetes mellitus with renal manifestation ([place]) 10/08/2014  . Dyslipidemia 10/08/2014  . LBP (low back pain) 10/08/2014  . Gastro-esophageal reflux disease without esophagitis 10/08/2014  . Microalbuminuria 10/08/2014  . Disturbance of skin sensation 10/08/2014  . Obesity (BMI 30-39.9) 10/08/2014  . Perennial allergic rhinitis with seasonal variation 10/08/2014  . Plantar fasciitis 10/08/2014  . Postablative ovarian failure 10/08/2014  . Menopausal symptom 10/08/2014  . Vitamin D deficiency 10/08/2014    Past Surgical History:  Procedure Laterality Date  . ABDOMINAL HYSTERECTOMY  2010  . APPENDECTOMY  1966  . COLONOSCOPY  06-07-13   Dr Bary Castilla  . DILATION AND CURETTAGE OF UTERUS      Family History  Problem Relation Age of Onset  . Diabetes Mother   . COPD Mother   . Diabetes Father   . Hypertension Father   . Cancer Father        Kidney and Prostate  . CAD Father   . Diabetes Brother        [employer]  . Kidney disease Brother        Tumor removed  . Breast cancer Neg Hx     Social History   Socioeconomic History  . Marital status: Single    Spouse name: Not on file  . Number of children: 0  . Years of education: Not on file  . Highest education level: 12th grade  Social Needs  . Financial resource strain: Not hard at all  . Food insecurity - worry: Never true  . Food insecurity - inability: Never true  . Transportation needs - medical: No  . Transportation needs - non-medical: No  Occupational History  . Not on file  Tobacco Use  . Smoking status: Never Smoker  . Smokeless tobacco: Never Used  Substance and Sexual Activity  . Alcohol use: No    Alcohol/week: 0.0 oz  . Drug use: No  . Sexual activity: No  Other Topics Concern  . Not on file  Social History Narrative  . Not on file     Current Outpatient Medications:  .  aspirin EC 81 MG tablet, Take 81 mg by mouth daily., Disp: , Rfl:  .  atorvastatin (LIPITOR) 40 MG tablet, Take 1  tablet (40 mg total) by mouth daily., Disp: 90 tablet, Rfl: 1 .  chlorzoxazone (PARAFON) 500 MG tablet, TAKE ONE TABLET BY MOUTH THREE TIMES DAILY, Disp: 90 tablet, Rfl: 0 .  cholecalciferol (VITAMIN D) 1000 units tablet, Take 1,000 Units by mouth daily., Disp: , Rfl:  .  fluticasone (FLONASE) 50 MCG/ACT nasal spray, Place 2 sprays into both nostrils as needed., Disp: , Rfl:  .  glucose blood (  ONE TOUCH ULTRA TEST) test strip, Use as instructed, Disp: 100 each, Rfl: 12 .  glucose blood test strip, Inject 1 each into the skin daily., Disp: , Rfl:  .  levocetirizine (XYZAL) 5 MG tablet, Take 1 tablet (5 mg total) by mouth daily., Disp: 90 tablet, Rfl: 1 .  losartan-hydrochlorothiazide (HYZAAR) 100-12.5 MG tablet, Take 1 tablet by mouth daily. Did not send rx on 07/24/2016 , she will try taking half, Disp: 90 tablet, Rfl: 1 .  MAGNESIUM OXIDE PO, Take 500 mg by mouth daily. , Disp: , Rfl:  .  Menthol, Topical Analgesic, 4 % GEL, Apply 2 g topically 4 (four) times daily., Disp: 1 Tube, Rfl: 1 .  metFORMIN (GLUCOPHAGE-XR) 500 MG 24 hr tablet, Take 2 tablets (1,000 mg total) by mouth daily with breakfast., Disp: 180 tablet, Rfl: 1 .  montelukast (SINGULAIR) 10 MG tablet, Take 1 tablet (10 mg total) by mouth at bedtime., Disp: 90 tablet, Rfl: 1 .  polyethylene glycol powder (GLYCOLAX/MIRALAX) powder, Take 17 g by mouth 2 (two) times daily., Disp: 3350 g, Rfl: 1 .  Turmeric 500 MG TABS, Take 1 tablet by mouth daily., Disp: , Rfl:  .  vitamin C (ASCORBIC ACID) 500 MG tablet, Take 500 mg by mouth daily., Disp: , Rfl:  .  XARELTO 20 MG TABS tablet, TAKE ONE TABLET BY MOUTH ONCE DAILY WITH  SUPPER, Disp: 30 tablet, Rfl: 5  No Known Allergies   ROS   Constitutional: Negative for fever , positive for  weight change.  Respiratory: Positive for cough but no  shortness of breath.   Cardiovascular: Negative for chest pain or palpitations.  Gastrointestinal: Negative for abdominal pain, no bowel changes.   Musculoskeletal: Negative for gait problem or joint swelling.  Skin: Negative for rash.  Neurological: Negative for dizziness or headache.  No other specific complaints in a complete review of systems (except as listed in HPI above).   Objective  Vitals:   03/29/17 1523  BP: 120/70  Pulse: (!) 106  Resp: 14  Temp: 98.3 F (36.8 C)  TempSrc: Oral  SpO2: 96%  Weight: 223 lb 3.2 oz (101.2 kg)  Height: 5' 9" (1.753 m)    Body mass index is 32.96 kg/m.  Physical Exam  Constitutional: Patient appears well-developed and well-nourished. No distress.  HENT: Head: Normocephalic and atraumatic. Ears: B TMs ok, no erythema or effusion; Nose: boggy turbinates bilaterally. Mouth/Throat: Oropharynx is clear and moist. No oropharyngeal exudate.  Eyes: Conjunctivae and EOM are normal. Pupils are equal, round, and reactive to light. No scleral icterus.  Neck: Normal range of motion. Neck supple. No JVD present. No thyromegaly present.  Cardiovascular: Normal rate, regular rhythm and normal heart sounds.  No murmur heard. No BLE edema. Pulmonary/Chest: Effort normal and breath sounds normal. No respiratory distress. Abdominal: Soft. Bowel sounds are normal, no distension. There is no tenderness. no masses Breast: no lumps or masses, no nipple discharge or rashes FEMALE GENITALIA:  Not done  RECTAL: not done Musculoskeletal: Normal range of motion, no joint effusions. No gross deformities Neurological: he is alert and oriented to person, place, and time. No cranial nerve deficit. Coordination, balance, strength, speech and gait are normal.  Skin: Skin is warm and dry. No rash noted. No erythema.  Psychiatric: Patient has a normal mood and affect. behavior is normal. Judgment and thought content normal.   Recent Results (from the past 2160 hour(s))  Comprehensive metabolic panel     Status: Abnormal  Collection Time: 01/22/17  3:48 PM  Result Value Ref Range   Sodium 136 135 - 145 mmol/L    Potassium 4.3 3.5 - 5.1 mmol/L   Chloride 99 (L) 101 - 111 mmol/L   CO2 28 22 - 32 mmol/L   Glucose, Bld 136 (H) 65 - 99 mg/dL   BUN 12 6 - 20 mg/dL   Creatinine, Ser 0.69 0.44 - 1.00 mg/dL   Calcium 9.6 8.9 - 10.3 mg/dL   Total Protein 7.4 6.5 - 8.1 g/dL   Albumin 4.1 3.5 - 5.0 g/dL   AST 21 15 - 41 U/L   ALT 23 14 - 54 U/L   Alkaline Phosphatase 90 38 - 126 U/L   Total Bilirubin 0.6 0.3 - 1.2 mg/dL   GFR calc non Af Amer >60 >60 mL/min   GFR calc Af Amer >60 >60 mL/min    Comment: (NOTE) The eGFR has been calculated using the CKD EPI equation. This calculation has not been validated in all clinical situations. eGFR's persistently <60 mL/min signify possible Chronic Kidney Disease.    Anion gap 9 5 - 15  CBC with Differential/Platelet     Status: Abnormal   Collection Time: 01/22/17  3:48 PM  Result Value Ref Range   WBC 9.7 3.6 - 11.0 K/uL   RBC 4.49 3.80 - 5.20 MIL/uL   Hemoglobin 12.5 12.0 - 16.0 g/dL   HCT 38.0 35.0 - 47.0 %   MCV 84.7 80.0 - 100.0 fL   MCH 27.9 26.0 - 34.0 pg   MCHC 32.9 32.0 - 36.0 g/dL   RDW 15.3 (H) 11.5 - 14.5 %   Platelets 211 150 - 440 K/uL   Neutrophils Relative % 65 %   Neutro Abs 6.3 1.4 - 6.5 K/uL   Lymphocytes Relative 25 %   Lymphs Abs 2.4 1.0 - 3.6 K/uL   Monocytes Relative 9 %   Monocytes Absolute 0.8 0.2 - 0.9 K/uL   Eosinophils Relative 2 %   Eosinophils Absolute 0.1 0 - 0.7 K/uL   Basophils Relative 1 %   Basophils Absolute 0.1 0 - 0.1 K/uL  POCT HgB A1C     Status: Abnormal   Collection Time: 02/26/17  3:44 PM  Result Value Ref Range   Hemoglobin A1C 6.6   COMPLETE METABOLIC PANEL WITH GFR     Status: None   Collection Time: 02/26/17  4:16 PM  Result Value Ref Range   Glucose, Bld 96 65 - 99 mg/dL    Comment: .            Fasting reference interval .    BUN 13 7 - 25 mg/dL   Creat 0.75 0.50 - 0.99 mg/dL    Comment: For patients >57 years of age, the reference limit for Creatinine is approximately 13% higher for  people identified as African-American. .    GFR, Est Non African American 85 > OR = 60 mL/min/1.52m   GFR, Est African American 99 > OR = 60 mL/min/1.765m  BUN/Creatinine Ratio NOT APPLICABLE 6 - 22 (calc)   Sodium 140 135 - 146 mmol/L   Potassium 4.5 3.5 - 5.3 mmol/L   Chloride 101 98 - 110 mmol/L   CO2 31 20 - 32 mmol/L   Calcium 9.9 8.6 - 10.4 mg/dL   Total Protein 7.0 6.1 - 8.1 g/dL   Albumin 4.3 3.6 - 5.1 g/dL   Globulin 2.7 1.9 - 3.7 g/dL (calc)   AG Ratio 1.6 1.0 -  2.5 (calc)   Total Bilirubin 0.5 0.2 - 1.2 mg/dL   Alkaline phosphatase (APISO) 84 33 - 130 U/L   AST 16 10 - 35 U/L   ALT 19 6 - 29 U/L  Lipid panel     Status: None   Collection Time: 02/26/17  4:16 PM  Result Value Ref Range   Cholesterol 122 <200 mg/dL   HDL 53 >50 mg/dL   Triglycerides 102 <150 mg/dL   LDL Cholesterol (Calc) 50 mg/dL (calc)    Comment: Reference range: <100 . Desirable range <100 mg/dL for primary prevention;   <70 mg/dL for patients with CHD or diabetic patients  with > or = 2 CHD risk factors. Marland Kitchen LDL-C is now calculated using the Martin-Hopkins  calculation, which is a validated novel method providing  better accuracy than the Friedewald equation in the  estimation of LDL-C.  Cresenciano Genre et al. Annamaria Helling. 3664;403(47): 2061-2068  (http://education.[facility].com/faq/FAQ164)    Total CHOL/HDL Ratio 2.3 <5.0 (calc)   Non-HDL Cholesterol (Calc) 69 <130 mg/dL (calc)    Comment: For patients with diabetes plus 1 major ASCVD risk  factor, treating to a non-HDL-C goal of <100 mg/dL  (LDL-C of <70 mg/dL) is considered a therapeutic  option.   TSH     Status: None   Collection Time: 02/26/17  4:16 PM  Result Value Ref Range   TSH 0.56 0.40 - 4.50 mIU/L  CBC with Differential/Platelet     Status: None   Collection Time: 02/26/17  4:16 PM  Result Value Ref Range   WBC 9.0 3.8 - 10.8 Thousand/uL   RBC 4.47 3.80 - 5.10 Million/uL   Hemoglobin 12.4 11.7 - 15.5 g/dL   HCT 36.6 35.0 -  45.0 %   MCV 81.9 80.0 - 100.0 fL   MCH 27.7 27.0 - 33.0 pg   MCHC 33.9 32.0 - 36.0 g/dL   RDW 14.4 11.0 - 15.0 %   Platelets 217 140 - 400 Thousand/uL   MPV 10.5 7.5 - 12.5 fL   Neutro Abs 5,229 1,500 - 7,800 cells/uL   Lymphs Abs 2,880 850 - 3,900 cells/uL   WBC mixed population 747 200 - 950 cells/uL   Eosinophils Absolute 117 15 - 500 cells/uL   Basophils Absolute 27 0 - 200 cells/uL   Neutrophils Relative % 58.1 %   Total Lymphocyte 32.0 %   Monocytes Relative 8.3 %   Eosinophils Relative 1.3 %   Basophils Relative 0.3 %    Diabetic Foot Exam: Diabetic Foot Exam - Simple   Simple Foot Form Diabetic Foot exam was performed with the following findings:  Yes 03/29/2017  3:42 PM  Visual Inspection No deformities, no ulcerations, no other skin breakdown bilaterally:  Yes Sensation Testing Intact to touch and monofilament testing bilaterally:  Yes Pulse Check Posterior Tibialis and Dorsalis pulse intact bilaterally:  Yes Comments      PHQ2/9: Depression screen [facility] 2/9 03/29/2017 10/30/2016 04/30/2016 02/03/2016 10/30/2015  Decreased Interest 0 0 0 0 0  Down, Depressed, Hopeless 0 0 0 0 0  PHQ - 2 Score 0 0 0 0 0     Fall Risk: Fall Risk  03/29/2017 02/26/2017 10/30/2016 04/30/2016 02/03/2016  Falls in the past year? _0   Number falls in past yr: - - - - -  Injury with Fall? - - - - -     Functional Status Survey: Is the patient deaf or have difficulty hearing?: No Does the patient have difficulty seeing,  even when wearing glasses/contacts?: No Does the patient have difficulty concentrating, remembering, or making decisions?: No Does the patient have difficulty walking or climbing stairs?: No Does the patient have difficulty dressing or bathing?: No Does the patient have difficulty doing errands alone such as visiting a doctor's office or shopping?: No   Assessment & Plan  1. Well woman exam  Discussed importance of 150 minutes of physical activity  weekly, eat two servings of fish weekly, eat one serving of tree nuts ( cashews, pistachios, pecans, almonds.Marland Kitchen) every other day, eat 6 servings of fruit/vegetables daily and drink plenty of water and avoid sweet beverages.   2. URI, acute  - chlorpheniramine-HYDROcodone (TUSSIONEX PENNKINETIC ER) 10-8 MG/5ML SUER; Take 5 mLs by mouth every 12 (twelve) hours as needed.  Dispense: 140 mL; Refill: 0

## 2017-04-05 ENCOUNTER — Encounter: Payer: Self-pay | Admitting: Family Medicine

## 2017-04-05 ENCOUNTER — Ambulatory Visit: Payer: BC Managed Care – PPO | Admitting: Family Medicine

## 2017-04-05 VITALS — BP 132/70 | HR 120 | Temp 98.5°F | Resp 18 | Ht 69.0 in | Wt 217.9 lb

## 2017-04-05 DIAGNOSIS — E1129 Type 2 diabetes mellitus with other diabetic kidney complication: Secondary | ICD-10-CM

## 2017-04-05 DIAGNOSIS — J0140 Acute pansinusitis, unspecified: Secondary | ICD-10-CM

## 2017-04-05 DIAGNOSIS — R809 Proteinuria, unspecified: Secondary | ICD-10-CM

## 2017-04-05 MED ORDER — AMOXICILLIN-POT CLAVULANATE 875-125 MG PO TABS: 1.0000 | ORAL_TABLET | Freq: Two times a day (BID) | ORAL | 0 refills | Status: AC

## 2017-04-05 MED ORDER — FLUTICASONE PROPIONATE 50 MCG/ACT NA SUSP: 2.0000 | NASAL | 11 refills | Status: DC | PRN

## 2017-04-05 NOTE — Patient Instructions (Addendum)

## 2017-04-05 NOTE — Progress Notes (Signed)
Name: Emily Pitts   MRN: [id number]    DOB: 12-23-1955   Date:04/05/2017       Progress Note  Subjective  Chief Complaint  Chief Complaint  Patient presents with  . Sinusitis    cough, congested, hoarse for 10 days    HPI  Pt presents with concern for sinus infection and cough - she was seen and treated on 03/29/2017 for URI.  She was feeling better for a few days, but 3 days ago she developed sinus pain and pressure, worsening sinus congestion, upper dental pain.  Still has dry cough with some shortness of breath; endorses mild nausea.  Denies chest pain, abdominal pain, vomiting/diarrhea, fevers/chills, body aches.  Has been drinking plenty of fluids, has had decreased appetite.  Has been taking OTC cough medication; not using flonase currently, needs refill; she has been taking tussionex and it has not been helping that much.  DM: She has not been checking BG's at home since becoming sick.  Last A1C was 6.6%.  She denies polyuria, polyphagia, or polydipsia.  Discussed sick day management and checking BG's regularly.  Patient Active Problem List   Diagnosis Date Noted  . 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 Long term current use of anticoagulant therapy 01/05/2016  . Factor 5 Leiden mutation, heterozygous ([place]) 05/02/2015  . Portal vein thrombosis 11/07/2014  . History of sepsis 10/10/2014  . Abnormal electrocardiogram 10/08/2014  . Nonspecific abnormal electromyogram (EMG) 10/08/2014  . Benign essential HTN 10/08/2014  . Diabetes mellitus with renal manifestation ([place]) 10/08/2014  . Dyslipidemia 10/08/2014  . LBP (low back pain) 10/08/2014  . Gastro-esophageal reflux disease without esophagitis 10/08/2014  . Microalbuminuria 10/08/2014  . Disturbance of skin sensation 10/08/2014  . Obesity (BMI 30-39.9) 10/08/2014  . Perennial allergic rhinitis with seasonal variation 10/08/2014  . Plantar fasciitis 10/08/2014  . Postablative ovarian failure 10/08/2014  . Menopausal  symptom 10/08/2014  . Vitamin D deficiency 10/08/2014    Social History   Tobacco Use  . Smoking status: Never Smoker  . Smokeless tobacco: Never Used  Substance Use Topics  . Alcohol use: No    Alcohol/week: 0.0 oz     Current Outpatient Medications:  .  aspirin EC 81 MG tablet, Take 81 mg by mouth daily., Disp: , Rfl:  .  atorvastatin (LIPITOR) 40 MG tablet, Take 1 tablet (40 mg total) by mouth daily., Disp: 90 tablet, Rfl: 1 .  chlorpheniramine-HYDROcodone (TUSSIONEX PENNKINETIC ER) 10-8 MG/5ML SUER, Take 5 mLs by mouth every 12 (twelve) hours as needed., Disp: 140 mL, Rfl: 0 .  chlorzoxazone (PARAFON) 500 MG tablet, TAKE ONE TABLET BY MOUTH THREE TIMES DAILY, Disp: 90 tablet, Rfl: 0 .  cholecalciferol (VITAMIN D) 1000 units tablet, Take 1,000 Units by mouth daily., Disp: , Rfl:  .  fluticasone (FLONASE) 50 MCG/ACT nasal spray, Place 2 sprays into both nostrils as needed., Disp: , Rfl:  .  glucose blood (ONE TOUCH ULTRA TEST) test strip, Use as instructed, Disp: 100 each, Rfl: 12 .  glucose blood test strip, Inject 1 each into the skin daily., Disp: , Rfl:  .  levocetirizine (XYZAL) 5 MG tablet, Take 1 tablet (5 mg total) by mouth daily., Disp: 90 tablet, Rfl: 1 .  losartan-hydrochlorothiazide (HYZAAR) 100-12.5 MG tablet, Take 1 tablet by mouth daily. Did not send rx on 07/24/2016 , she will try taking half, Disp: 90 tablet, Rfl: 1 .  MAGNESIUM OXIDE PO, Take 500 mg by mouth daily. , Disp: , Rfl:  .  Menthol, Topical Analgesic, 4 % GEL, Apply 2 g topically 4 (four) times daily., Disp: 1 Tube, Rfl: 1 .  metFORMIN (GLUCOPHAGE-XR) 500 MG 24 hr tablet, Take 2 tablets (1,000 mg total) by mouth daily with breakfast., Disp: 180 tablet, Rfl: 1 .  montelukast (SINGULAIR) 10 MG tablet, Take 1 tablet (10 mg total) by mouth at bedtime., Disp: 90 tablet, Rfl: 1 .  polyethylene glycol powder (GLYCOLAX/MIRALAX) powder, Take 17 g by mouth 2 (two) times daily., Disp: 3350 g, Rfl: 1 .  Turmeric 500  MG TABS, Take 1 tablet by mouth daily., Disp: , Rfl:  .  vitamin C (ASCORBIC ACID) 500 MG tablet, Take 500 mg by mouth daily., Disp: , Rfl:  .  XARELTO 20 MG TABS tablet, TAKE ONE TABLET BY MOUTH ONCE DAILY WITH  SUPPER, Disp: 30 tablet, Rfl: 5  No Known Allergies  ROS Ten systems reviewed and is negative except as mentioned in HPI  Objective  Vitals:   04/05/17 1427  BP: 132/70  Pulse: (!) 120  Resp: 18  Temp: 98.5 F (36.9 C)  TempSrc: Oral  SpO2: 96%  Weight: 217 lb 14.4 oz (98.8 kg)  Height: 5\' 9"  (1.753 m)   Body mass index is 32.18 kg/m.  Nursing Note and Vital Signs reviewed.  Physical Exam  Constitutional: Patient appears well-developed and well-nourished. Obese No distress.  HEENT: head atraumatic, normocephalic, pupils equal and reactive to light, EOM's intact, TM's without erythema or bulging, bilateral maxillary and LEFT frontal sinus tenderness present accompanied by bilateral submandibular lymphadenopathy, oropharynx mildly erythematous and moist without exudate Cardiovascular: Normal rate, regular rhythm, S1/S2 present.  No murmur or rub heard. No BLE edema. Pulmonary/Chest: Effort normal and breath sounds have slight expiratory wheeze in the LUL. No respiratory distress or retractions. Psychiatric: Patient has a normal mood and affect. behavior is normal. Judgment and thought content normal.  No results found for this or any previous visit (from the past 72 hour(s)).  Assessment & Plan  1. Acute non-recurrent pansinusitis - amoxicillin-clavulanate (AUGMENTIN) 875-125 MG tablet; Take 1 tablet by mouth 2 (two) times daily for 10 days.  Dispense: 20 tablet; Refill: 0 - fluticasone (FLONASE) 50 MCG/ACT nasal spray; Place 2 sprays into both nostrils as needed.  Dispense: 16 g; Refill: 11 - Work note for tomorrow is provided to allow 24 hours of antibiotics prior to return to work.  2. Type 2 diabetes mellitus with microalbuminuria, without long-term current use  of insulin ([place]) - Discussed sick day care, monitoring BG's periodically.  What to do if hyper/hypoglycemic. - Discussed increased risk for infection due to DM - however, her A1C was below goal at 6.6% in December 2018.  -Red flags and when to present for emergency care or RTC including fever >101.52F, chest pain, shortness of breath, new/worsening/un-resolving symptoms, pain with ocular movements, periorbital swelling reviewed with patient at time of visit. Follow up and care instructions discussed and provided in AVS.

## 2017-04-08 ENCOUNTER — Other Ambulatory Visit: Payer: Self-pay | Admitting: Hematology and Oncology

## 2017-04-08 ENCOUNTER — Other Ambulatory Visit: Payer: Self-pay | Admitting: Family Medicine

## 2017-04-08 DIAGNOSIS — E785 Hyperlipidemia, unspecified: Secondary | ICD-10-CM

## 2017-04-08 DIAGNOSIS — D6851 Activated protein C resistance: Secondary | ICD-10-CM

## 2017-04-08 DIAGNOSIS — I81 Portal vein thrombosis: Secondary | ICD-10-CM

## 2017-04-30 ENCOUNTER — Other Ambulatory Visit: Payer: BC Managed Care – PPO

## 2017-04-30 ENCOUNTER — Ambulatory Visit: Payer: BC Managed Care – PPO | Admitting: Hematology and Oncology

## 2017-05-03 ENCOUNTER — Ambulatory Visit: Payer: BC Managed Care – PPO | Admitting: Hematology and Oncology

## 2017-05-03 ENCOUNTER — Other Ambulatory Visit: Payer: BC Managed Care – PPO

## 2017-05-04 ENCOUNTER — Other Ambulatory Visit: Payer: Self-pay | Admitting: Family Medicine

## 2017-05-04 DIAGNOSIS — R809 Proteinuria, unspecified: Principal | ICD-10-CM

## 2017-05-04 DIAGNOSIS — E1129 Type 2 diabetes mellitus with other diabetic kidney complication: Secondary | ICD-10-CM

## 2017-05-05 NOTE — Progress Notes (Signed)
[facility] day:  05/06/17   Chief Complaint: Emily Pitts is a 62 y.o. female with Factor V Leiden and portal vein thrombosis who is seen for 6 month assessment on Xarelto.  HPI:   The patient was last seen in the hematology clinic on 10/30/2016.  At that time, patient was doing well.  She did not verbalize any acute concerns.  Patient denied bruising or bleeding.  Patient was experiencing elevated preprandial blood glucose levels.  She had just followed up with Dr. Ancil Boozer and discovered that her hemoglobin A1c was elevated 6.6.  Her Metformin was increased.  Patient was wearing a right knee brace in the office due to her osteoarthritis.  Patient was scheduled to have a mammogram in 11/2016.  Exam was unremarkable.  WBC was 9800 with an [place] of 7400.  Hemoglobin 12.5, hematocrit 37.0, and platelets 215,000.  Chemistries were unremarkable.  Bilateral screening mammogram on 01/27/2017 revealed no mammographic evidence of malignancy.  Screening mammogram was recommended in 1 year.  In the interim, patient has been doing well. Patient has pain in her LEFT groin. She is unable to recall in traumatic events. No known hernias. Patient denies bleeding; no hematochezia, melena, or gross hematuria. Pain in groin is rated 2/10 today.   Patient is eating well. She has lost 11 pounds. She notes that she had a cold recently that was treated with antibiotics. States, "I didn't want to eat much then. I must have lost the weight then". Patient denies any fevers or sweats.    Past Medical History:  Diagnosis Date  . Allergy   . Diabetes mellitus without complication ([place]) 8850  . Hyperlipidemia   . Hypertension   . Low back pain, episodic   . Numbness of feet   . Vitamin D deficiency     Past Surgical History:  Procedure Laterality Date  . ABDOMINAL HYSTERECTOMY  2010  . APPENDECTOMY  1966  . COLONOSCOPY  06-07-13   Dr Bary Castilla  . DILATION AND CURETTAGE OF  UTERUS      Family History  Problem Relation Age of Onset  . Diabetes Mother   . COPD Mother   . Diabetes Father   . Hypertension Father   . Cancer Father        Kidney and Prostate  . CAD Father   . Diabetes Brother        [employer]  . Kidney disease Brother        Tumor removed  . Breast cancer Neg Hx   Her grandfather had a CVA in his 78s.  A niece had an early pregnancy loss.   Social History:  reports that  has never smoked. she has never used smokeless tobacco. She reports that she does not drink alcohol or use drugs.  She does not have any children.  She lives in [place].  The patient is alone today.  Allergies: No Known Allergies  Current Medications: Current Outpatient Medications  Medication Sig Dispense Refill  . aspirin EC 81 MG tablet Take 81 mg by mouth daily.    Marland Kitchen atorvastatin (LIPITOR) 40 MG tablet TAKE 1 TABLET BY MOUTH ONCE DAILY 90 tablet 1  . chlorpheniramine-HYDROcodone (TUSSIONEX PENNKINETIC ER) 10-8 MG/5ML SUER Take 5 mLs by mouth every 12 (twelve) hours as needed. 140 mL 0  . chlorzoxazone (PARAFON) 500 MG tablet TAKE ONE TABLET BY MOUTH THREE TIMES DAILY 90 tablet 0  . cholecalciferol (VITAMIN D) 1000 units  tablet Take 1,000 Units by mouth daily.    . fluticasone (FLONASE) 50 MCG/ACT nasal spray Place 2 sprays into both nostrils as needed. 16 g 11  . glucose blood (ONE TOUCH ULTRA TEST) test strip Use as instructed 100 each 12  . glucose blood test strip Inject 1 each into the skin daily.    Marland Kitchen levocetirizine (XYZAL) 5 MG tablet Take 1 tablet (5 mg total) by mouth daily. 90 tablet 1  . losartan-hydrochlorothiazide (HYZAAR) 100-12.5 MG tablet Take 1 tablet by mouth daily. Did not send rx on 07/24/2016 , she will try taking half 90 tablet 1  . MAGNESIUM OXIDE PO Take 500 mg by mouth daily.     . Menthol, Topical Analgesic, 4 % GEL Apply 2 g topically 4 (four) times daily. 1 Tube 1  . metFORMIN (GLUCOPHAGE-XR) 500 MG 24 hr tablet TAKE 2 TABLETS BY  MOUTH ONCE DAILY WITH BREAKFAST 180 tablet 1  . montelukast (SINGULAIR) 10 MG tablet Take 1 tablet (10 mg total) by mouth at bedtime. 90 tablet 1  . polyethylene glycol powder (GLYCOLAX/MIRALAX) powder Take 17 g by mouth 2 (two) times daily. 3350 g 1  . Turmeric 500 MG TABS Take 1 tablet by mouth daily.    . vitamin C (ASCORBIC ACID) 500 MG tablet Take 500 mg by mouth daily.    Alveda Reasons 20 MG TABS tablet TAKE 1 TABLET BY MOUTH ONCE DAILY WITH  SUPPER 30 tablet 5   No current facility-administered medications for this visit.     Review of Systems:  GENERAL:  Feels good.  No fevers or sweats.  Weight down 11 pounds since last visit. PERFORMANCE STATUS (ECOG):  0 HEENT:  Interval cold/sinus infection.  No visual changes, sore throat, mouth sores or tenderness. Lungs: No shortness of breath or cough.  No hemoptysis. Cardiac:  No chest pain, palpitations, orthopnea, or PND.  GI:  Constipation.  No nausea, vomiting, diarrhea, melena or hematochezia.  Colonoscopy in 06/2013. GU:  No urgency, frequency, dysuria, or hematuria. Musculoskeletal:  No back pain.  RIGHT knee pain secondary to arthritis.  No muscle tenderness. Extremities:  No pain or swelling. Skin:  No rashes or skin changes. Neuro:  No headache, numbness or weakness, balance or coordination issues. Endocrine:  Diabetes.  No thyroid issues, hot flashes or night sweats. Psych:  No mood changes, depression or anxiety. Pain:  2/10 - LEFT groin.  Review of systems:  All other systems reviewed and found to be negative.  Physical Exam: Blood pressure (!) 144/80, pulse 85, temperature (!) 97.4 F (36.3 C), weight 226 lb 9 oz (102.8 kg). GENERAL:  Well developed, well nourished, woman sitting comfortably in the exam room in no acute distress. MENTAL STATUS:  Alert and oriented to person, place and time. HEAD:  Curly red hair.  Normocephalic, atraumatic, face symmetric, no Cushingoid features. EYES:  Brown eyes.  Pupils equal round and  reactive to light and accomodation.  No conjunctivitis or scleral icterus. ENT:  Oropharynx clear without lesion.  Tongue normal. Mucous membranes moist.  RESPIRATORY:  Clear to auscultation without rales, wheezes or rhonchi. CARDIOVASCULAR:  Regular rate and rhythm without murmur, rub or gallop. ABDOMEN:  Soft, non-tender, with active bowel sounds, and no appreciable hepatosplenomegaly.  No masses. SKIN:  No rashes, ulcers or lesions. EXTREMITIES: No edema, no skin discoloration or tenderness.  No palpable cords. RIGHT knee pain; arthritis.  LYMPH NODES: No palpable cervical, supraclavicular, axillary or inguinal adenopathy  NEUROLOGICAL: Unremarkable. PSYCH:  Appropriate.   Appointment on 05/06/2017  Component Date Value Ref Range Status  . Sodium 05/06/2017 137  135 - 145 mmol/L Final  . Potassium 05/06/2017 4.7  3.5 - 5.1 mmol/L Final  . Chloride 05/06/2017 101  101 - 111 mmol/L Final  . CO2 05/06/2017 28  22 - 32 mmol/L Final  . Glucose, Bld 05/06/2017 126* 65 - 99 mg/dL Final  . BUN 05/06/2017 16  6 - 20 mg/dL Final  . Creatinine, Ser 05/06/2017 0.68  0.44 - 1.00 mg/dL Final  . Calcium 05/06/2017 10.0  8.9 - 10.3 mg/dL Final  . Total Protein 05/06/2017 7.4  6.5 - 8.1 g/dL Final  . Albumin 05/06/2017 3.9  3.5 - 5.0 g/dL Final  . AST 05/06/2017 25  15 - 41 U/L Final  . ALT 05/06/2017 25  14 - 54 U/L Final  . Alkaline Phosphatase 05/06/2017 81  38 - 126 U/L Final  . Total Bilirubin 05/06/2017 0.5  0.3 - 1.2 mg/dL Final  . GFR calc non Af Amer 05/06/2017 >60  >60 mL/min Final  . GFR calc Af Amer 05/06/2017 >60  >60 mL/min Final   Comment: (NOTE) The eGFR has been calculated using the CKD EPI equation. This calculation has not been validated in all clinical situations. eGFR's persistently <60 mL/min signify possible Chronic Kidney Disease.   Georgiann Hahn gap 05/06/2017 8  5 - 15 Final   Performed at [facility], [address] [place], [place] [address]  . WBC 05/06/2017  9.3  3.6 - 11.0 K/uL Final  . RBC 05/06/2017 4.36  3.80 - 5.20 MIL/uL Final  . Hemoglobin 05/06/2017 12.4  12.0 - 16.0 g/dL Final  . HCT 05/06/2017 36.6  35.0 - 47.0 % Final  . MCV 05/06/2017 83.9  80.0 - 100.0 fL Final  . MCH 05/06/2017 28.4  26.0 - 34.0 pg Final  . MCHC 05/06/2017 33.9  32.0 - 36.0 g/dL Final  . RDW 05/06/2017 15.2* 11.5 - 14.5 % Final  . Platelets 05/06/2017 223  150 - 440 K/uL Final  . Neutrophils Relative % 05/06/2017 63  % Final  . Neutro Abs 05/06/2017 5.9  1.4 - 6.5 K/uL Final  . Lymphocytes Relative 05/06/2017 27  % Final  . Lymphs Abs 05/06/2017 2.5  1.0 - 3.6 K/uL Final  . Monocytes Relative 05/06/2017 8  % Final  . Monocytes Absolute 05/06/2017 0.7  0.2 - 0.9 K/uL Final  . Eosinophils Relative 05/06/2017 2  % Final  . Eosinophils Absolute 05/06/2017 0.2  0 - 0.7 K/uL Final  . Basophils Relative 05/06/2017 0  % Final  . Basophils Absolute 05/06/2017 0.0  0 - 0.1 K/uL Final   Performed at [facility], [address] [place], [place] [address]    Assessment:  CHARNETTE YOUNKIN is a 62 y.o. female with portal vein thrombosis presenting with fever, chills, dehydration, weight loss, and abdominal pain.  Symptoms lasted for about 4 weeks.  Abdominal and pelvic CT on 11/07/2014 revealed a partially thrombosed portal veins (left > right).  The main portal vein and mesenteric veins remained patent.  Porta hepatis nodes measured 1.2 cm in short axis (stable).  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  The following studies were normal:  prothrombin gene mutation, lupus anticoagulant panel, anticardiolipin antibodies, homocysteine, protein C antigen (88%), protein C activity (111%), protein S antigen total (131%), protein S antigen free (108%), anti-thrombin III antigen (88%), anti-thrombin III functional (101%).  On 12/11/2014, protein S antigen total (95%), protein S antigen free (77%), and protein S activity (69%)  were normal.  PNH by flow cytometry was negative.  JAK2 V617F and exon 12 were negative on 05/02/2015.  Additional testing on 11/27/2014 revealed resolution of anemia.  The following studies were normal:  SPEP, free light chain ratio, 24 hour UPEP, IgG, IgA, ANA, ESR, ferritin, reticulocyte count, B12, and folate.  Serum protein electrophoresis revealed no monoclonal protein.  Kappa free light chains were 26.67, lambda free light chains 24.67 and ratio 1.08 (normal).   IgM was 247 (26-217).    She was started on Xarelto on 11/08/2014.  Plan is for long term anticoagulation given the unprovoked thrombus.  Ultrasound hepatic doppler on 05/02/2015 revealed no convincing evidence of occlusion or thrombus.  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 There was interval reconstitution of previously noted thrombosis within the peripheral branches of the right portal vein. The right and main portal veins appeared widely patent.  A discrete hepatic lesion/mass was not identified.  There was no evidence of cirrhosis. There was unchanged borderline splenomegaly without evidence of gastroesophageal varices or ascites.  There was extensive colonic diverticulosis.  She denies any prior history of thrombosis.  She has not been on birth control pills.  She denies any family history of thrombosis.   Screening studies for malignancy were normal.  Chest x-ray on 11/05/2014 was negative.  Bilateral mammogram on 01/27/2017 demonstrated no mammographic evidence of malignancy.  Colonoscopy on 06/07/2013 revealed only diverticulosis.    Symptomatically, patient has pain in her LEFT groin. She denies urinary symptoms.  Exam is unremarkable.  Labs are stable.   Plan: 1.  Labs today:  CBC with diff, CMP. 2.  Review interval mammogram - negative for malignancy 3.  Continue Xarelto 20 mg a day. 4.  Patient to contact clinic between appointments if any concerns. 5.   Discuss decreasing frequency of appointment and lab checks with Dr. Ancil Boozer.  Next lab draw in 3 months (CBC with diff, CMP) with Dr Ancil Boozer.  6.  RTC in 6 months for MD assessment and labs (CBC with diff, CMP).   Honor Loh, NP  05/06/2017, 3:22 PM   I saw and evaluated the patient, participating in the key portions of the service and reviewing pertinent diagnostic studies and records.  I reviewed the nurse practitioner's note and agree with the findings and the plan.  The assessment and plan were discussed with the patient.  A few questions were asked by the patient and answered.   Nolon Stalls, MD 05/06/2017,3:22 PM

## 2017-05-06 ENCOUNTER — Inpatient Hospital Stay (HOSPITAL_BASED_OUTPATIENT_CLINIC_OR_DEPARTMENT_OTHER): Payer: BC Managed Care – PPO | Admitting: Hematology and Oncology

## 2017-05-06 ENCOUNTER — Inpatient Hospital Stay: Payer: BC Managed Care – PPO | Attending: Hematology and Oncology

## 2017-05-06 ENCOUNTER — Encounter: Payer: Self-pay | Admitting: Hematology and Oncology

## 2017-05-06 VITALS — BP 144/80 | HR 85 | Temp 97.4°F | Wt 226.6 lb

## 2017-05-06 DIAGNOSIS — I81 Portal vein thrombosis: Secondary | ICD-10-CM | POA: Diagnosis not present

## 2017-05-06 DIAGNOSIS — D6851 Activated protein C resistance: Secondary | ICD-10-CM | POA: Diagnosis not present

## 2017-05-06 DIAGNOSIS — E559 Vitamin D deficiency, unspecified: Secondary | ICD-10-CM | POA: Diagnosis not present

## 2017-05-06 DIAGNOSIS — K5730 Diverticulosis of large intestine without perforation or abscess without bleeding: Secondary | ICD-10-CM | POA: Insufficient documentation

## 2017-05-06 DIAGNOSIS — Z7984 Long term (current) use of oral hypoglycemic drugs: Secondary | ICD-10-CM | POA: Insufficient documentation

## 2017-05-06 DIAGNOSIS — E785 Hyperlipidemia, unspecified: Secondary | ICD-10-CM | POA: Diagnosis not present

## 2017-05-06 DIAGNOSIS — Z7982 Long term (current) use of aspirin: Secondary | ICD-10-CM | POA: Insufficient documentation

## 2017-05-06 DIAGNOSIS — Z7901 Long term (current) use of anticoagulants: Secondary | ICD-10-CM

## 2017-05-06 DIAGNOSIS — Z79899 Other long term (current) drug therapy: Secondary | ICD-10-CM | POA: Insufficient documentation

## 2017-05-06 DIAGNOSIS — I10 Essential (primary) hypertension: Secondary | ICD-10-CM | POA: Diagnosis not present

## 2017-05-06 DIAGNOSIS — E119 Type 2 diabetes mellitus without complications: Secondary | ICD-10-CM | POA: Insufficient documentation

## 2017-05-06 LAB — COMPREHENSIVE METABOLIC PANEL
ALT: 25 U/L (ref 14–54)
AST: 25 U/L (ref 15–41)
Albumin: 3.9 g/dL (ref 3.5–5.0)
Alkaline Phosphatase: 81 U/L (ref 38–126)
Anion gap: 8 (ref 5–15)
BUN: 16 mg/dL (ref 6–20)
CO2: 28 mmol/L (ref 22–32)
Calcium: 10 mg/dL (ref 8.9–10.3)
Chloride: 101 mmol/L (ref 101–111)
Creatinine, Ser: 0.68 mg/dL (ref 0.44–1.00)
GFR calc Af Amer: >60 mL/min (ref >60)
GFR calc non Af Amer: >60 mL/min (ref >60)
Glucose, Bld: 126 mg/dL — ABNORMAL HIGH (ref 65–99)
Potassium: 4.7 mmol/L (ref 3.5–5.1)
Sodium: 137 mmol/L (ref 135–145)
Total Bilirubin: 0.5 mg/dL (ref 0.3–1.2)
Total Protein: 7.4 g/dL (ref 6.5–8.1)

## 2017-05-06 LAB — CBC WITH DIFFERENTIAL/PLATELET
Basophils Absolute: 0 10*3/uL (ref 0–0.1)
Basophils Relative: 0 %
Eosinophils Absolute: 0.2 10*3/uL (ref 0–0.7)
Eosinophils Relative: 2 %
HCT: 36.6 % (ref 35.0–47.0)
Hemoglobin: 12.4 g/dL (ref 12.0–16.0)
Lymphocytes Relative: 27 %
Lymphs Abs: 2.5 10*3/uL (ref 1.0–3.6)
MCH: 28.4 pg (ref 26.0–34.0)
MCHC: 33.9 g/dL (ref 32.0–36.0)
MCV: 83.9 fL (ref 80.0–100.0)
Monocytes Absolute: 0.7 10*3/uL (ref 0.2–0.9)
Monocytes Relative: 8 %
Neutro Abs: 5.9 10*3/uL (ref 1.4–6.5)
Neutrophils Relative %: 63 %
Platelets: 223 10*3/uL (ref 150–440)
RBC: 4.36 MIL/uL (ref 3.80–5.20)
RDW: 15.2 % — ABNORMAL HIGH (ref 11.5–14.5)
WBC: 9.3 10*3/uL (ref 3.6–11.0)

## 2017-05-06 NOTE — Progress Notes (Signed)
Patient offers no complaints today. 

## 2017-05-12 ENCOUNTER — Other Ambulatory Visit: Payer: Self-pay | Admitting: Family Medicine

## 2017-05-13 NOTE — Telephone Encounter (Addendum)
Hypertension medication request: Losartan-HCTZ  Last office visit pertaining to hypertension: 03/29/2017   BP Readings from Last 3 Encounters:  05/06/17 (!) 144/80  04/05/17 132/70  03/29/17 120/70    Lab Results  Component Value Date   CREATININE 0.68 05/06/2017   BUN 16 05/06/2017   NA 137 05/06/2017   K 4.7 05/06/2017   CL 101 05/06/2017   CO2 28 05/06/2017     Follow up on 06/23/2017

## 2017-05-20 ENCOUNTER — Telehealth: Payer: Self-pay

## 2017-05-20 NOTE — Telephone Encounter (Signed)
Copied from [place] [phone]. Topic: General - Other >> May 19, 2017  4:11 PM Carolyn Stare wrote:  Pt call to say the pharmacy told her the following med is on back order losartan-hydrochlorothiazide (HYZAAR) 100-25 MG tablet and is needing a replacement asap please   losartan-hydrochlorothiazide (HYZAAR) 100-25 MG tablet   Daphne

## 2017-06-23 ENCOUNTER — Ambulatory Visit: Payer: BC Managed Care – PPO | Admitting: Family Medicine

## 2017-06-23 ENCOUNTER — Encounter: Payer: Self-pay | Admitting: Family Medicine

## 2017-06-23 VITALS — BP 128/60 | HR 107 | Resp 16 | Ht 69.0 in | Wt 227.6 lb

## 2017-06-23 DIAGNOSIS — R809 Proteinuria, unspecified: Secondary | ICD-10-CM | POA: Diagnosis not present

## 2017-06-23 DIAGNOSIS — D6851 Activated protein C resistance: Secondary | ICD-10-CM | POA: Diagnosis not present

## 2017-06-23 DIAGNOSIS — E785 Hyperlipidemia, unspecified: Secondary | ICD-10-CM | POA: Diagnosis not present

## 2017-06-23 DIAGNOSIS — M1711 Unilateral primary osteoarthritis, right knee: Secondary | ICD-10-CM

## 2017-06-23 DIAGNOSIS — E1129 Type 2 diabetes mellitus with other diabetic kidney complication: Secondary | ICD-10-CM | POA: Diagnosis not present

## 2017-06-23 DIAGNOSIS — I10 Essential (primary) hypertension: Secondary | ICD-10-CM | POA: Diagnosis not present

## 2017-06-23 DIAGNOSIS — Z79899 Other long term (current) drug therapy: Secondary | ICD-10-CM | POA: Diagnosis not present

## 2017-06-23 DIAGNOSIS — I81 Portal vein thrombosis: Secondary | ICD-10-CM

## 2017-06-23 DIAGNOSIS — M542 Cervicalgia: Secondary | ICD-10-CM | POA: Diagnosis not present

## 2017-06-23 MED ORDER — DICLOFENAC SODIUM 1 % TD GEL
4.0000 g | Freq: Four times a day (QID) | TRANSDERMAL | 1 refills | Status: DC
Start: 2017-06-23 — End: 2020-07-02

## 2017-06-23 MED ORDER — TIZANIDINE HCL 2 MG PO TABS: 2.0000 mg | ORAL_TABLET | Freq: Every day | ORAL | 0 refills | Status: DC

## 2017-06-23 NOTE — Progress Notes (Signed)
Name: Emily Pitts   MRN: [id number]    DOB: 26-Apr-1955   Date:06/23/2017       Progress Note  Subjective  Chief Complaint  Chief Complaint  Patient presents with  . Medication Refill    4 month F/U  . Diabetes  . Hypertension  . Hyperlipidemia  . Allergic Rhinitis   . Constipation  . Gastroesophageal Reflux  . Neck Pain    Onset-couple of months, aching intermittently    HPI  Diabetes Type II with microalbuminuria: she is on Metformin two pills daily hgbA1C 6.9% to  to 6.6%, 6.6% last visit, we will check hgbA1C at the lab today.She deniesblurred vision, polyphagia, polyuria or polydipsia. Chronic nocturia once per night - she drinks a lot of water in the evenings. Complaint with diabetic diet. Glucose fasting is around 130's. Eye exam is due, urine micro today.   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 She is more active this time of the year, when she starts doing her yard.   HTN: she is on Losartan/hctz, she is taking one pill daily, no chest pain, palpitation or SOB. BP is at goal. Continue current medications.    Hyperlipidemia: she is now on Atorvastatin and denies side effects. Reviewed last labs with patient today   GERD: she stopped taking Omeprazole, controlled with life style modification, no heartburn or regurgitation, very seldom has to take Tums, unchanged since last visit.   Perennial AR with seasonal variation: Occasional sneezing, rhinorrhea and headaches, worse in the Spring and Fall. Still taking Xyzal, and singulair only prn. Symptoms under control with medication   Portal Vein Thrombosis: seen hematologist Dr. Trenton Founds, taking Xarelto, denies any bleeding. . She has elevated factor V leiden. Read Dr. Kem Parkinson note and I will check CBC and comp panel as recommended every 3 months and send her the report to decrease frequency of visits with hematologist.   Constipation: she has history of intermittent  constipation and usually responds to Miralax or other otc medications, symptoms were worse around Dec 2018, but back to baseline, [place] between 2-4, but controlled with medication. No blood in stools.   Neck pain: going on since January , after an URI, she states it moves up and down her left side of neck, usually aching, but more intense with certain movements, intermittently .   Right knee OA: seen by Ortho, had PT and is doing better, still has episode of effusion and pain, cannot take nsaid's because she is on Xarelto and has a risk of bleeding, taking Tylenol we will try topical medication  Patient Active Problem List   Diagnosis Date Noted  . 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 Long term current use of anticoagulant therapy 01/05/2016  . Factor 5 Leiden mutation, heterozygous ([place]) 05/02/2015  . Portal vein thrombosis 11/07/2014  . History of sepsis 10/10/2014  . Abnormal electrocardiogram 10/08/2014  . Nonspecific abnormal electromyogram (EMG) 10/08/2014  . Benign essential HTN 10/08/2014  . Diabetes mellitus with renal manifestation ([place]) 10/08/2014  . Dyslipidemia 10/08/2014  . LBP (low back pain) 10/08/2014  . Gastro-esophageal reflux disease without esophagitis 10/08/2014  . Microalbuminuria 10/08/2014  . Disturbance of skin sensation 10/08/2014  . Obesity (BMI 30-39.9) 10/08/2014  . Perennial allergic rhinitis with seasonal variation 10/08/2014  . Plantar fasciitis 10/08/2014  . Postablative ovarian failure 10/08/2014  . Menopausal symptom 10/08/2014  . Vitamin D deficiency 10/08/2014    Past Surgical History:  Procedure Laterality Date  .  ABDOMINAL HYSTERECTOMY  2010  . APPENDECTOMY  1966  . COLONOSCOPY  06-07-13   Dr Bary Castilla  . DILATION AND CURETTAGE OF UTERUS      Family History  Problem Relation Age of Onset  . Diabetes Mother   . COPD Mother   . Diabetes Father   . Hypertension Father   . Cancer Father        Kidney and Prostate  . CAD Father   .  Diabetes Brother        [employer]  . Kidney disease Brother        Tumor removed  . Breast cancer Neg Hx     Social History   Socioeconomic History  . Marital status: Single    Spouse name: Not on file  . Number of children: 0  . Years of education: Not on file  . Highest education level: 12th grade  Occupational History  . Not on file  Social Needs  . Financial resource strain: Not hard at all  . Food insecurity:    Worry: Never true    Inability: Never true  . Transportation needs:    Medical: No    Non-medical: No  Tobacco Use  . Smoking status: Never Smoker  . Smokeless tobacco: Never Used  Substance and Sexual Activity  . Alcohol use: No    Alcohol/week: 0.0 oz  . Drug use: No  . Sexual activity: Never  Lifestyle  . Physical activity:    Days per week: 5 days    Minutes per session: 10 min  . Stress: Not at all  Relationships  . Social connections:    Talks on phone: Three times a week    Gets together: More than three times a week    Attends religious service: More than 4 times per year    Active member of club or organization: Yes    Attends meetings of clubs or organizations: More than 4 times per year    Relationship status: Never married  . Intimate partner violence:    Fear of current or ex partner: No    Emotionally abused: No    Physically abused: No    Forced sexual activity: No  Other Topics Concern  . Not on file  Social History Narrative  . Not on file     Current Outpatient Medications:  .  aspirin EC 81 MG tablet, Take 81 mg by mouth daily., Disp: , Rfl:  .  atorvastatin (LIPITOR) 40 MG tablet, TAKE 1 TABLET BY MOUTH ONCE DAILY, Disp: 90 tablet, Rfl: 1 .  chlorzoxazone (PARAFON) 500 MG tablet, TAKE ONE TABLET BY MOUTH THREE TIMES DAILY, Disp: 90 tablet, Rfl: 0 .  cholecalciferol (VITAMIN D) 1000 units tablet, Take 1,000 Units by mouth daily., Disp: , Rfl:  .  fluticasone (FLONASE) 50 MCG/ACT nasal spray, Place 2 sprays into both  nostrils as needed., Disp: 16 g, Rfl: 11 .  glucose blood (ONE TOUCH ULTRA TEST) test strip, Use as instructed, Disp: 100 each, Rfl: 12 .  glucose blood test strip, Inject 1 each into the skin daily., Disp: , Rfl:  .  levocetirizine (XYZAL) 5 MG tablet, Take 1 tablet (5 mg total) by mouth daily., Disp: 90 tablet, Rfl: 1 .  losartan-hydrochlorothiazide (HYZAAR) 100-25 MG tablet, TAKE 1 TABLET BY MOUTH ONCE DAILY, Disp: 90 tablet, Rfl: 1 .  MAGNESIUM OXIDE PO, Take 500 mg by mouth daily. , Disp: , Rfl:  .  Menthol, Topical Analgesic, 4 % GEL,  Apply 2 g topically 4 (four) times daily., Disp: 1 Tube, Rfl: 1 .  metFORMIN (GLUCOPHAGE-XR) 500 MG 24 hr tablet, TAKE 2 TABLETS BY MOUTH ONCE DAILY WITH BREAKFAST, Disp: 180 tablet, Rfl: 1 .  montelukast (SINGULAIR) 10 MG tablet, Take 1 tablet (10 mg total) by mouth at bedtime., Disp: 90 tablet, Rfl: 1 .  polyethylene glycol powder (GLYCOLAX/MIRALAX) powder, Take 17 g by mouth 2 (two) times daily., Disp: 3350 g, Rfl: 1 .  Turmeric 500 MG TABS, Take 1 tablet by mouth daily., Disp: , Rfl:  .  vitamin C (ASCORBIC ACID) 500 MG tablet, Take 500 mg by mouth daily., Disp: , Rfl:  .  XARELTO 20 MG TABS tablet, TAKE 1 TABLET BY MOUTH ONCE DAILY WITH  SUPPER, Disp: 30 tablet, Rfl: 5 .  chlorpheniramine-HYDROcodone (TUSSIONEX PENNKINETIC ER) 10-8 MG/5ML SUER, Take 5 mLs by mouth every 12 (twelve) hours as needed. (Patient not taking: Reported on 06/23/2017), Disp: 140 mL, Rfl: 0  No Known Allergies   ROS  Constitutional: Negative for fever or weight change.  Respiratory: Negative for cough and shortness of breath.   Cardiovascular: Negative for chest pain or palpitations.  Gastrointestinal: Negative for abdominal pain, no bowel changes.  Musculoskeletal: Negative for gait problem or joint swelling.  Skin: Negative for rash.  Neurological: Negative for dizziness or headache.  No other specific complaints in a complete review of systems (except as listed in HPI  above).   Objective  Vitals:   06/23/17 1522  BP: 128/60  Pulse: (!) 107  Resp: 16  SpO2: 97%  Weight: 227 lb 9.6 oz (103.2 kg)  Height: 5' 9"  (1.753 m)    Body mass index is 33.61 kg/m.  Physical Exam  Constitutional: Patient appears well-developed and well-nourished. Obese No distress.  HEENT: head atraumatic, normocephalic, pupils equal and reactive to light, neck supple, pain with left lateral bending and rotation to the right with extension of neck, also during palpation of left side of neck, throat within normal limits Cardiovascular: Normal rate, regular rhythm and normal heart sounds.  No murmur heard. No BLE edema. Pulmonary/Chest: Effort normal and breath sounds normal. No respiratory distress. Abdominal: Soft.  There is no tenderness. Psychiatric: Patient has a normal mood and affect. behavior is normal. Judgment and thought content normal. Muscular skeletal: mind crepitus with extension of right knee  Recent Results (from the past 2160 hour(s))  Comprehensive metabolic panel     Status: Abnormal   Collection Time: 05/06/17  2:34 PM  Result Value Ref Range   Sodium 137 135 - 145 mmol/L   Potassium 4.7 3.5 - 5.1 mmol/L   Chloride 101 101 - 111 mmol/L   CO2 28 22 - 32 mmol/L   Glucose, Bld 126 (H) 65 - 99 mg/dL   BUN 16 6 - 20 mg/dL   Creatinine, Ser 0.68 0.44 - 1.00 mg/dL   Calcium 10.0 8.9 - 10.3 mg/dL   Total Protein 7.4 6.5 - 8.1 g/dL   Albumin 3.9 3.5 - 5.0 g/dL   AST 25 15 - 41 U/L   ALT 25 14 - 54 U/L   Alkaline Phosphatase 81 38 - 126 U/L   Total Bilirubin 0.5 0.3 - 1.2 mg/dL   GFR calc non Af Amer >60 >60 mL/min   GFR calc Af Amer >60 >60 mL/min    Comment: (NOTE) The eGFR has been calculated using the CKD EPI equation. This calculation has not been validated in all clinical situations. eGFR's persistently <60 mL/min  signify possible Chronic Kidney Disease.    Anion gap 8 5 - 15    Comment: Performed at [facility], [address] [place], [place] [address]  CBC with Differential/Platelet     Status: Abnormal   Collection Time: 05/06/17  2:34 PM  Result Value Ref Range   WBC 9.3 3.6 - 11.0 K/uL   RBC 4.36 3.80 - 5.20 MIL/uL   Hemoglobin 12.4 12.0 - 16.0 g/dL   HCT 36.6 35.0 - 47.0 %   MCV 83.9 80.0 - 100.0 fL   MCH 28.4 26.0 - 34.0 pg   MCHC 33.9 32.0 - 36.0 g/dL   RDW 15.2 (H) 11.5 - 14.5 %   Platelets 223 150 - 440 K/uL   Neutrophils Relative % 63 %   Neutro Abs 5.9 1.4 - 6.5 K/uL   Lymphocytes Relative 27 %   Lymphs Abs 2.5 1.0 - 3.6 K/uL   Monocytes Relative 8 %   Monocytes Absolute 0.7 0.2 - 0.9 K/uL   Eosinophils Relative 2 %   Eosinophils Absolute 0.2 0 - 0.7 K/uL   Basophils Relative 0 %   Basophils Absolute 0.0 0 - 0.1 K/uL    Comment: Performed at [facility], [address] [place], [place] [address]      PHQ2/9: Depression screen [facility] 2/9 03/29/2017 10/30/2016 04/30/2016 02/03/2016 10/30/2015  Decreased Interest 0 0 0 0 0  Down, Depressed, Hopeless 0 0 0 0 0  PHQ - 2 Score 0 0 0 0 0     Fall Risk: Fall Risk  06/23/2017 03/29/2017 02/26/2017 10/30/2016 04/30/2016  Falls in the past year? No No No No No  Number falls in past yr: - - - - -  Injury with Fall? - - - - -     Functional Status Survey: Is the patient deaf or have difficulty hearing?: No Does the patient have difficulty seeing, even when wearing glasses/contacts?: No Does the patient have difficulty concentrating, remembering, or making decisions?: No Does the patient have difficulty walking or climbing stairs?: No Does the patient have difficulty dressing or bathing?: No Does the patient have difficulty doing errands alone such as visiting a doctor's office or shopping?: No   Assessment & Plan  1. Type 2 diabetes mellitus with microalbuminuria, without long-term current use of insulin (HCC)  - HgB A1c - Urine Microalbumin w/creat. ratio  2. Benign essential HTN  - COMPLETE METABOLIC PANEL WITH GFR - CBC with  Differential/Platelet  3. Dyslipidemia  Continue medication   4. Factor 5 Leiden mutation, heterozygous ([place])  Under the care of hematologist   5. Portal vein thrombosis  Doing well on Xarelto   6. Long-term use of high-risk medication  - COMPLETE METABOLIC PANEL WITH GFR - CBC with Differential/Platelet  7. Neck pain on left side  - tiZANidine (ZANAFLEX) 2 MG tablet; Take 1 tablet (2 mg total) by mouth at bedtime.  Dispense: 30 tablet; Refill: 0  8. Primary osteoarthritis of right knee  - diclofenac sodium (VOLTAREN) 1 % GEL; Apply 4 g topically 4 (four) times daily.  Dispense: 100 g; Refill: 1

## 2017-07-27 ENCOUNTER — Telehealth: Payer: Self-pay | Admitting: Family Medicine

## 2017-07-27 NOTE — Telephone Encounter (Signed)
Copied from [place] [id number]. Topic: Inquiry >> Jul 27, 2017  3:01 PM Margot Ables wrote: Reason for CRM: pt calling to get lab results from last Wednesday 07/21/17. She states she came into the office for lab draw. Please advise.

## 2017-07-28 NOTE — Telephone Encounter (Signed)
Sent request to [place] in the [facility] lab to look into.

## 2017-07-29 ENCOUNTER — Telehealth: Payer: Self-pay | Admitting: Family Medicine

## 2017-07-29 LAB — HM DIABETES EYE EXAM

## 2017-07-29 NOTE — Telephone Encounter (Unsigned)
Copied from [place] [id number]. Topic: Inquiry >> Jul 27, 2017  3:01 PM Margot Ables wrote: Reason for CRM: pt calling to get lab results from last Wednesday 07/21/17. She states she came into the office for lab draw. Please advise.

## 2017-07-29 NOTE — Telephone Encounter (Signed)
I don't see results? Can you verify with Cindi?

## 2017-07-29 NOTE — Telephone Encounter (Signed)
This was the week power was out back in the lab and test were not performed. Patient notified by Cristy Folks, [place]

## 2017-08-03 ENCOUNTER — Telehealth: Payer: Self-pay | Admitting: Family Medicine

## 2017-08-03 NOTE — Telephone Encounter (Signed)
Patient notified blood work is upfront and ready for her to have done.

## 2017-08-03 NOTE — Telephone Encounter (Signed)
Copied from [place] [phone]. Topic: Quick Communication - See Telephone Encounter >> Aug 03, 2017 11:23 AM Vernona Rieger wrote: CRM for notification. See Telephone encounter for: 08/03/17.  Patient said she was told she needed to repeat her blood work from 4/17. Please advise. No orders in.  Call back [phone]

## 2017-08-03 NOTE — Telephone Encounter (Deleted)
Copied from [place] [phone]. Topic: Quick Communication - See Telephone Encounter >> Aug 03, 2017 11:23 AM Vernona Rieger wrote: CRM for notification. See Telephone encounter for: 08/03/17.  Patient said she was told she needed to repeat her blood work from 4/17. Please advise. No orders in.  Call back [phone]

## 2017-08-06 ENCOUNTER — Encounter: Payer: Self-pay | Admitting: Family Medicine

## 2017-08-06 LAB — CBC WITH DIFFERENTIAL/PLATELET
BASOS ABS: 28 {cells}/uL (ref 0–200)
Basophils Relative: 0.3 %
EOS PCT: 1.5 %
Eosinophils Absolute: 140 cells/uL (ref 15–500)
HEMATOCRIT: 35.3 % (ref 35.0–45.0)
HEMOGLOBIN: 12.2 g/dL (ref 11.7–15.5)
LYMPHS ABS: 2279 {cells}/uL (ref 850–3900)
MCH: 27.9 pg (ref 27.0–33.0)
MCHC: 34.6 g/dL (ref 32.0–36.0)
MCV: 80.6 fL (ref 80.0–100.0)
MONOS PCT: 7.6 %
MPV: 10.7 fL (ref 7.5–12.5)
NEUTROS ABS: 6147 {cells}/uL (ref 1500–7800)
Neutrophils Relative %: 66.1 %
Platelets: 230 10*3/uL (ref 140–400)
RBC: 4.38 10*6/uL (ref 3.80–5.10)
RDW: 14.4 % (ref 11.0–15.0)
Total Lymphocyte: 24.5 %
WBC mixed population: 707 cells/uL (ref 200–950)
WBC: 9.3 10*3/uL (ref 3.8–10.8)

## 2017-08-06 LAB — HEMOGLOBIN A1C
HEMOGLOBIN A1C: 6.3 %{Hb} — AB (ref <5.7)
MEAN PLASMA GLUCOSE: 134 (calc)
eAG (mmol/L): 7.4 (calc)

## 2017-08-06 LAB — COMPLETE METABOLIC PANEL WITH GFR
AG RATIO: 1.6 (calc) (ref 1.0–2.5)
ALT: 18 U/L (ref 6–29)
AST: 15 U/L (ref 10–35)
Albumin: 4.1 g/dL (ref 3.6–5.1)
Alkaline phosphatase (APISO): 85 U/L (ref 33–130)
BILIRUBIN TOTAL: 0.5 mg/dL (ref 0.2–1.2)
BUN: 15 mg/dL (ref 7–25)
CHLORIDE: 100 mmol/L (ref 98–110)
CO2: 27 mmol/L (ref 20–32)
Calcium: 9.9 mg/dL (ref 8.6–10.4)
Creat: 0.75 mg/dL (ref 0.50–0.99)
GFR, Est African American: 98 mL/min/{1.73_m2} (ref >=60)
GFR, Est Non African American: 85 mL/min/{1.73_m2} (ref >=60)
GLUCOSE: 152 mg/dL — AB (ref 65–139)
Globulin: 2.6 g/dL (calc) (ref 1.9–3.7)
POTASSIUM: 3.8 mmol/L (ref 3.5–5.3)
Sodium: 139 mmol/L (ref 135–146)
TOTAL PROTEIN: 6.7 g/dL (ref 6.1–8.1)

## 2017-08-06 LAB — MICROALBUMIN / CREATININE URINE RATIO
Creatinine, Urine: 67 mg/dL (ref 20–275)
MICROALB/CREAT RATIO: 4 ug/mg{creat} (ref <30)
Microalb, Ur: 0.3 mg/dL

## 2017-09-10 ENCOUNTER — Ambulatory Visit
Admission: RE | Admit: 2017-09-10 | Discharge: 2017-09-10 | Disposition: A | Discharge status: Home / Self Care | Payer: BC Managed Care – PPO | Source: Ambulatory Visit | Attending: Nurse Practitioner | Admitting: Nurse Practitioner

## 2017-09-10 ENCOUNTER — Encounter: Payer: Self-pay | Admitting: Nurse Practitioner

## 2017-09-10 ENCOUNTER — Ambulatory Visit: Payer: BC Managed Care – PPO | Admitting: Nurse Practitioner

## 2017-09-10 VITALS — BP 140/66 | HR 94 | Temp 97.9°F | Resp 16 | Ht 69.0 in | Wt 229.5 lb

## 2017-09-10 DIAGNOSIS — M79672 Pain in left foot: Secondary | ICD-10-CM

## 2017-09-10 DIAGNOSIS — M7989 Other specified soft tissue disorders: Secondary | ICD-10-CM | POA: Diagnosis not present

## 2017-09-10 DIAGNOSIS — W108XXA Fall (on) (from) other stairs and steps, initial encounter: Secondary | ICD-10-CM | POA: Diagnosis not present

## 2017-09-10 NOTE — Progress Notes (Addendum)
Name: Emily Pitts   MRN: [id number]    DOB: 01-16-1956   Date:09/10/2017       Progress Note  Subjective  Chief Complaint  Chief Complaint  Patient presents with  . Foot Injury    Missed last step when she was going out to walk the dog and she hit the ground. She has been treating her ankle and foot with ice compressions and elevations.  . Ankle Injury    HPI  Was walking dog last night and missed a step and fell forward on the ground and twisted the left foot and ankle. Some swelling to left foot, pain with walking. Has been using ice and elevating foot. Has taken tylenol for pain with mild relief.    Patient Active Problem List   Diagnosis Date Noted  . 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 Long term current use of anticoagulant therapy 01/05/2016  . Factor 5 Leiden mutation, heterozygous ([place]) 05/02/2015  . Portal vein thrombosis 11/07/2014  . History of sepsis 10/10/2014  . Abnormal electrocardiogram 10/08/2014  . Nonspecific abnormal electromyogram (EMG) 10/08/2014  . Benign essential HTN 10/08/2014  . Diabetes mellitus with renal manifestation ([place]) 10/08/2014  . Dyslipidemia 10/08/2014  . LBP (low back pain) 10/08/2014  . Gastro-esophageal reflux disease without esophagitis 10/08/2014  . Microalbuminuria 10/08/2014  . Disturbance of skin sensation 10/08/2014  . Obesity (BMI 30-39.9) 10/08/2014  . Perennial allergic rhinitis with seasonal variation 10/08/2014  . Plantar fasciitis 10/08/2014  . Postablative ovarian failure 10/08/2014  . Menopausal symptom 10/08/2014  . Vitamin D deficiency 10/08/2014    Past Medical History:  Diagnosis Date  . Allergy   . Diabetes mellitus without complication ([place]) 6213  . Hyperlipidemia   . Hypertension   . Low back pain, episodic   . Numbness of feet   . Vitamin D deficiency     Past Surgical History:  Procedure Laterality Date  . ABDOMINAL HYSTERECTOMY  2010  . APPENDECTOMY  1966  . COLONOSCOPY  06-07-13   Dr Bary Castilla   . DILATION AND CURETTAGE OF UTERUS      Social History   Tobacco Use  . Smoking status: Never Smoker  . Smokeless tobacco: Never Used  Substance Use Topics  . Alcohol use: No    Alcohol/week: 0.0 oz     Current Outpatient Medications:  .  aspirin EC 81 MG tablet, Take 81 mg by mouth daily., Disp: , Rfl:  .  atorvastatin (LIPITOR) 40 MG tablet, TAKE 1 TABLET BY MOUTH ONCE DAILY, Disp: 90 tablet, Rfl: 1 .  chlorpheniramine-HYDROcodone (TUSSIONEX PENNKINETIC ER) 10-8 MG/5ML SUER, Take 5 mLs by mouth every 12 (twelve) hours as needed., Disp: 140 mL, Rfl: 0 .  chlorzoxazone (PARAFON) 500 MG tablet, TAKE ONE TABLET BY MOUTH THREE TIMES DAILY, Disp: 90 tablet, Rfl: 0 .  cholecalciferol (VITAMIN D) 1000 units tablet, Take 1,000 Units by mouth daily., Disp: , Rfl:  .  diclofenac sodium (VOLTAREN) 1 % GEL, Apply 4 g topically 4 (four) times daily., Disp: 100 g, Rfl: 1 .  fluticasone (FLONASE) 50 MCG/ACT nasal spray, Place 2 sprays into both nostrils as needed., Disp: 16 g, Rfl: 11 .  glucose blood (ONE TOUCH ULTRA TEST) test strip, Use as instructed, Disp: 100 each, Rfl: 12 .  glucose blood test strip, Inject 1 each into the skin daily., Disp: , Rfl:  .  levocetirizine (XYZAL) 5 MG tablet, Take 1 tablet (5 mg total) by mouth daily., Disp: 90 tablet, Rfl: 1 .  losartan-hydrochlorothiazide (HYZAAR) 100-25 MG tablet, TAKE 1 TABLET BY MOUTH ONCE DAILY, Disp: 90 tablet, Rfl: 1 .  MAGNESIUM OXIDE PO, Take 500 mg by mouth daily. , Disp: , Rfl:  .  Menthol, Topical Analgesic, 4 % GEL, Apply 2 g topically 4 (four) times daily., Disp: 1 Tube, Rfl: 1 .  metFORMIN (GLUCOPHAGE-XR) 500 MG 24 hr tablet, TAKE 2 TABLETS BY MOUTH ONCE DAILY WITH BREAKFAST, Disp: 180 tablet, Rfl: 1 .  montelukast (SINGULAIR) 10 MG tablet, Take 1 tablet (10 mg total) by mouth at bedtime., Disp: 90 tablet, Rfl: 1 .  polyethylene glycol powder (GLYCOLAX/MIRALAX) powder, Take 17 g by mouth 2 (two) times daily., Disp: 3350 g, Rfl:  1 .  tiZANidine (ZANAFLEX) 2 MG tablet, Take 1 tablet (2 mg total) by mouth at bedtime., Disp: 30 tablet, Rfl: 0 .  Turmeric 500 MG TABS, Take 1 tablet by mouth daily., Disp: , Rfl:  .  vitamin C (ASCORBIC ACID) 500 MG tablet, Take 500 mg by mouth daily., Disp: , Rfl:  .  XARELTO 20 MG TABS tablet, TAKE 1 TABLET BY MOUTH ONCE DAILY WITH  SUPPER, Disp: 30 tablet, Rfl: 5  No Known Allergies  ROS   No other specific complaints in a complete review of systems (except as listed in HPI above).  Objective  Vitals:   09/10/17 1443  BP: 140/66  Pulse: 94  Resp: 16  Temp: 97.9 F (36.6 C)  TempSrc: Oral  SpO2: 96%  Weight: 229 lb 8 oz (104.1 kg)  Height: 5\' 9"  (1.753 m)    Body mass index is 33.89 kg/m.  Nursing Note and Vital Signs reviewed.  Physical Exam   Constitutional: Patient appears well-developed and well-nourished. No distress.  Cardiovascular: Normal rate, regular rhythm, S1/S2 present.  No murmur or rub heard.  Pulmonary/Chest: Effort normal and breath sounds clear. No respiratory distress or retractions. MSK: left foot and ankle swelling and tenderness, no bruising, redness. Pain with turning ankle to the left.  Psychiatric: Patient has a normal mood and affect. behavior is normal. Judgment and thought content normal.  No results found for this or any previous visit (from the past 72 hour(s)).  Assessment & Plan  1. Left foot pain -RICE, take tylenol for pain as needed; discussed emerge ortho walk-in or return if needed.  - DG Foot Complete Left; Future - DG Ankle Complete Left; Future  2. Fall (on) (from) other stairs and steps, initial encounter - DG Foot Complete Left; Future - DG Ankle Complete Left; Future   -Follow up and care instructions discussed and provided in AVS.  ------------------------------------ I have reviewed this encounter including the documentation in this note and/or discussed this patient with the provider, Suezanne Cheshire DNP  AGNP-C. I am certifying that I agree with the content of this note as supervising physician. Enid Derry, [facility] 09/10/2017, 5:00 PM

## 2017-09-10 NOTE — Patient Instructions (Signed)
RICE for Routine Care of Injuries Many injuries can be cared for using rest, ice, compression, and elevation (RICE therapy). Using RICE therapy can help to lessen pain and swelling. It can help your body to heal. Rest Reduce your normal activities and avoid using the injured part of your body. You can go back to your normal activities when you feel okay and your doctor says it is okay. Ice Do not put ice on your bare skin.  Put ice in a plastic bag.  Place a towel between your skin and the bag.  Leave the ice on for 20 minutes, 2-3 times a day.  Do this for as long as told by your doctor. Compression Compression means putting pressure on the injured area. This can be done with an elastic bandage. If an elastic bandage has been applied:  Remove and reapply the bandage every 3-4 hours or as told by your doctor.  Make sure the bandage is not wrapped too tight. Wrap the bandage more loosely if part of your body beyond the bandage is blue, swollen, cold, painful, or loses feeling (numb).  See your doctor if the bandage seems to make your problems worse.  Elevation Elevation means keeping the injured area raised. Raise the injured area above your heart or the center of your chest if you can. When should I get help? You should get help if:  You keep having pain and swelling.  Your symptoms get worse.  Get help right away if: You should get help right away if:  You have sudden bad pain at or below the area of your injury.  You have redness or more swelling around your injury.  You have tingling or numbness at or below the injury that does not go away when you take off the bandage.  This information is not intended to replace advice given to you by your health care provider. Make sure you discuss any questions you have with your health care provider. Document Released: 08/12/2007 Document Revised: 01/21/2016 Document Reviewed: 01/31/2014 Elsevier Interactive Patient Education  2017  [employer]  

## 2017-10-08 ENCOUNTER — Other Ambulatory Visit: Payer: Self-pay | Admitting: Family Medicine

## 2017-10-08 DIAGNOSIS — E785 Hyperlipidemia, unspecified: Secondary | ICD-10-CM

## 2017-10-12 ENCOUNTER — Other Ambulatory Visit: Payer: Self-pay | Admitting: Hematology and Oncology

## 2017-10-12 DIAGNOSIS — D6851 Activated protein C resistance: Secondary | ICD-10-CM

## 2017-10-12 DIAGNOSIS — I81 Portal vein thrombosis: Secondary | ICD-10-CM

## 2017-10-26 ENCOUNTER — Ambulatory Visit: Payer: BC Managed Care – PPO | Admitting: Family Medicine

## 2017-11-04 ENCOUNTER — Ambulatory Visit: Payer: BC Managed Care – PPO | Admitting: Family Medicine

## 2017-11-04 ENCOUNTER — Encounter: Payer: Self-pay | Admitting: Family Medicine

## 2017-11-04 ENCOUNTER — Encounter: Payer: Self-pay | Admitting: Hematology and Oncology

## 2017-11-04 ENCOUNTER — Inpatient Hospital Stay (HOSPITAL_BASED_OUTPATIENT_CLINIC_OR_DEPARTMENT_OTHER): Payer: BC Managed Care – PPO | Admitting: Hematology and Oncology

## 2017-11-04 ENCOUNTER — Inpatient Hospital Stay: Payer: BC Managed Care – PPO | Attending: Hematology and Oncology

## 2017-11-04 VITALS — BP 125/77 | HR 89 | Temp 98.4°F | Resp 16 | Wt 227.6 lb

## 2017-11-04 VITALS — BP 122/68 | Resp 16 | Ht 69.0 in | Wt 226.9 lb

## 2017-11-04 DIAGNOSIS — I81 Portal vein thrombosis: Secondary | ICD-10-CM

## 2017-11-04 DIAGNOSIS — E1129 Type 2 diabetes mellitus with other diabetic kidney complication: Secondary | ICD-10-CM | POA: Diagnosis not present

## 2017-11-04 DIAGNOSIS — D6851 Activated protein C resistance: Secondary | ICD-10-CM | POA: Insufficient documentation

## 2017-11-04 DIAGNOSIS — E559 Vitamin D deficiency, unspecified: Secondary | ICD-10-CM

## 2017-11-04 DIAGNOSIS — I10 Essential (primary) hypertension: Secondary | ICD-10-CM | POA: Diagnosis not present

## 2017-11-04 DIAGNOSIS — E785 Hyperlipidemia, unspecified: Secondary | ICD-10-CM | POA: Diagnosis not present

## 2017-11-04 DIAGNOSIS — Z7901 Long term (current) use of anticoagulants: Secondary | ICD-10-CM

## 2017-11-04 DIAGNOSIS — Z79899 Other long term (current) drug therapy: Secondary | ICD-10-CM

## 2017-11-04 DIAGNOSIS — M1711 Unilateral primary osteoarthritis, right knee: Secondary | ICD-10-CM

## 2017-11-04 DIAGNOSIS — R809 Proteinuria, unspecified: Secondary | ICD-10-CM

## 2017-11-04 DIAGNOSIS — K219 Gastro-esophageal reflux disease without esophagitis: Secondary | ICD-10-CM

## 2017-11-04 LAB — POCT GLYCOSYLATED HEMOGLOBIN (HGB A1C): HBA1C, POC (CONTROLLED DIABETIC RANGE): 6.6 % (ref 0.0–7.0)

## 2017-11-04 MED ORDER — METFORMIN HCL ER 750 MG PO TB24: 1500.0000 mg | ORAL_TABLET | Freq: Every day | ORAL | 1 refills | Status: DC

## 2017-11-04 MED ORDER — LOSARTAN POTASSIUM-HCTZ 100-25 MG PO TABS: 1.0000 | ORAL_TABLET | Freq: Every day | ORAL | 1 refills | Status: DC

## 2017-11-04 NOTE — Progress Notes (Signed)
Pt in for follow up, reports having "alittle swelling in ankles recently".

## 2017-11-04 NOTE — Progress Notes (Signed)
Name: Emily Pitts   MRN: [id number]    DOB: 1955/08/14   Date:11/04/2017       Progress Note  Subjective  Chief Complaint  Chief Complaint  Patient presents with  . Medication Refill  . Diabetes    Checks BS at home, [place]  . Hypertension    Edema in feet and ankles  . Hyperlipidemia  . Allergic Rhinitis     Some days good and some days her allergies act up  . Gastroesophageal Reflux    Well controlled    HPI  Diabetes Type II with microalbuminuria: she is on Metformin 500 ER two pills daily hgbA1C 6.9% toto 6.6%,6.6% , 6.3% today is back at 6.6%. She has noticed that glucose is usually high in am's average of 150's but better later in the day. Highest over the past few months was 228 and lowest 104. We will change Metformin to ER 750 and take both pills at the same time. She deniesblurred vision, polyphagia, polyuria or polydipsia. Chronic nocturia once per night - she drinks a lot of water in the evenings. She has not been very compliant with her diet lately.   Morbid obesity:  she is going home after work, [employer], weight has been stable. She states a little more active during the summer, she lost 4 lbs since last visit. Discussed BMI is above 30 and co-morbidities.   HTN: she is on Losartan/hctz, she is taking one pill daily, no chest pain, palpitation or SOB. BP is at goal. Occasionally has mild dizziness, bp is towards low end of normal today, we will monitor and consider going down on bp medication next visit    Hyperlipidemia: she is now on Atorvastatin and denies side effects. time to recheck labs   GERD: she stopped taking Omeprazole, controlled with life style modification, no heartburn or regurgitation, very seldom has to take Tums. Unchanged   Perennial AR with seasonal variation: Occasional sneezing, rhinorrhea and headaches, worse in the Spring and Fall. Still taking Xyzal, and singulair only prn. Symptoms under  control at this time  Portal Vein Thrombosis: seen hematologist Dr. Trenton Founds, taking Xarelto, denies any bleeding. . She has elevated factor V leiden. Read Dr. Kem Parkinson note and I will check CBC and comp panel as recommended every 3 months and send her the report to decrease frequency of visits with hematologist. She is due for repeat labs today     Constipation: she has history of intermittent constipation and usually responds to Miralax or other otc medications, symptoms were worse around Dec 2018, but back to baseline, [place] usually a 4 with medication.   Neck pain: it is intermittent now, not taking medication at this time, very seldom takes Tylenol   Right knee OA: seen by Ortho, had PT and is doing better, still has episode of effusion and pain, cannot take nsaid's because she is on Xarelto and has a risk of bleeding, taking Tylenol pr now and has been doing well now  Patient Active Problem List   Diagnosis Date Noted  . 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 Long term current use of anticoagulant therapy 01/05/2016  . Factor 5 Leiden mutation, heterozygous ([place]) 05/02/2015  . Portal vein thrombosis 11/07/2014  . History of sepsis 10/10/2014  . Abnormal electrocardiogram 10/08/2014  . Nonspecific abnormal electromyogram (EMG) 10/08/2014  . Benign essential HTN 10/08/2014  . Diabetes mellitus with renal manifestation ([place]) 10/08/2014  . Dyslipidemia 10/08/2014  . LBP (low back pain) 10/08/2014  .  Gastro-esophageal reflux disease without esophagitis 10/08/2014  . Microalbuminuria 10/08/2014  . Disturbance of skin sensation 10/08/2014  . Obesity (BMI 30-39.9) 10/08/2014  . Perennial allergic rhinitis with seasonal variation 10/08/2014  . Plantar fasciitis 10/08/2014  . Postablative ovarian failure 10/08/2014  . Menopausal symptom 10/08/2014  . Vitamin D deficiency 10/08/2014    Past Surgical History:  Procedure Laterality Date  . ABDOMINAL HYSTERECTOMY  2010  .  APPENDECTOMY  1966  . COLONOSCOPY  06-07-13   Dr Bary Castilla  . DILATION AND CURETTAGE OF UTERUS      Family History  Problem Relation Age of Onset  . Diabetes Mother   . COPD Mother   . Diabetes Father   . Hypertension Father   . Cancer Father        Kidney and Prostate  . CAD Father   . Diabetes Brother        [employer]  . Kidney disease Brother        Tumor removed  . Breast cancer Neg Hx     Social History   Socioeconomic History  . Marital status: Single    Spouse name: Not on file  . Number of children: 0  . Years of education: Not on file  . Highest education level: 12th grade  Occupational History  . Not on file  Social Needs  . Financial resource strain: Not hard at all  . Food insecurity:    Worry: Never true    Inability: Never true  . Transportation needs:    Medical: No    Non-medical: No  Tobacco Use  . Smoking status: Never Smoker  . Smokeless tobacco: Never Used  Substance and Sexual Activity  . Alcohol use: No    Alcohol/week: 0.0 standard drinks  . Drug use: No  . Sexual activity: Never  Lifestyle  . Physical activity:    Days per week: 5 days    Minutes per session: 10 min  . Stress: Not at all  Relationships  . Social connections:    Talks on phone: Three times a week    Gets together: More than three times a week    Attends religious service: More than 4 times per year    Active member of club or organization: Yes    Attends meetings of clubs or organizations: More than 4 times per year    Relationship status: Never married  . Intimate partner violence:    Fear of current or ex partner: No    Emotionally abused: No    Physically abused: No    Forced sexual activity: No  Other Topics Concern  . Not on file  Social History Narrative  . Not on file     Current Outpatient Medications:  .  aspirin EC 81 MG tablet, Take 81 mg by mouth daily., Disp: , Rfl:  .  atorvastatin (LIPITOR) 40 MG tablet, TAKE 1 TABLET BY MOUTH ONCE  DAILY, Disp: 90 tablet, Rfl: 1 .  chlorzoxazone (PARAFON) 500 MG tablet, TAKE ONE TABLET BY MOUTH THREE TIMES DAILY, Disp: 90 tablet, Rfl: 0 .  cholecalciferol (VITAMIN D) 1000 units tablet, Take 1,000 Units by mouth daily., Disp: , Rfl:  .  diclofenac sodium (VOLTAREN) 1 % GEL, Apply 4 g topically 4 (four) times daily., Disp: 100 g, Rfl: 1 .  fluticasone (FLONASE) 50 MCG/ACT nasal spray, Place 2 sprays into both nostrils as needed., Disp: 16 g, Rfl: 11 .  glucose blood (ONE TOUCH ULTRA TEST) test strip, Use  as instructed, Disp: 100 each, Rfl: 12 .  glucose blood test strip, Inject 1 each into the skin daily., Disp: , Rfl:  .  levocetirizine (XYZAL) 5 MG tablet, Take 1 tablet (5 mg total) by mouth daily., Disp: 90 tablet, Rfl: 1 .  losartan-hydrochlorothiazide (HYZAAR) 100-25 MG tablet, TAKE 1 TABLET BY MOUTH ONCE DAILY, Disp: 90 tablet, Rfl: 1 .  MAGNESIUM OXIDE PO, Take 500 mg by mouth daily. , Disp: , Rfl:  .  Menthol, Topical Analgesic, 4 % GEL, Apply 2 g topically 4 (four) times daily., Disp: 1 Tube, Rfl: 1 .  metFORMIN (GLUCOPHAGE-XR) 500 MG 24 hr tablet, TAKE 2 TABLETS BY MOUTH ONCE DAILY WITH BREAKFAST, Disp: 180 tablet, Rfl: 1 .  montelukast (SINGULAIR) 10 MG tablet, Take 1 tablet (10 mg total) by mouth at bedtime., Disp: 90 tablet, Rfl: 1 .  polyethylene glycol powder (GLYCOLAX/MIRALAX) powder, Take 17 g by mouth 2 (two) times daily., Disp: 3350 g, Rfl: 1 .  tiZANidine (ZANAFLEX) 2 MG tablet, Take 1 tablet (2 mg total) by mouth at bedtime., Disp: 30 tablet, Rfl: 0 .  Turmeric 500 MG TABS, Take 1 tablet by mouth daily., Disp: , Rfl:  .  vitamin C (ASCORBIC ACID) 500 MG tablet, Take 500 mg by mouth daily., Disp: , Rfl:  .  XARELTO 20 MG TABS tablet, TAKE 1 TABLET BY MOUTH ONCE DAILY WITH  SUPPER, Disp: 90 tablet, Rfl: 1 .  chlorpheniramine-HYDROcodone (TUSSIONEX PENNKINETIC ER) 10-8 MG/5ML SUER, Take 5 mLs by mouth every 12 (twelve) hours as needed. (Patient not taking: Reported on  11/04/2017), Disp: 140 mL, Rfl: 0  No Known Allergies   ROS  Constitutional: Negative for fever or weight change.  Respiratory: Negative for cough and shortness of breath.   Cardiovascular: Negative for chest pain or palpitations.  Gastrointestinal: Negative for abdominal pain, no bowel changes.  Musculoskeletal: Negative for gait problem or joint swelling.  Skin: Negative for rash.  Neurological: positive  For mild  dizziness but no  headache.  No other specific complaints in a complete review of systems (except as listed in HPI above).   Objective  Vitals:   11/04/17 0932  BP: 122/68  Resp: 16  Weight: 226 lb 14.4 oz (102.9 kg)  Height: 5\' 9"  (1.753 m)    Body mass index is 33.51 kg/m.  Physical Exam  Constitutional: Patient appears well-developed and well-nourished. Obese No distress.  HEENT: head atraumatic, normocephalic, pupils equal and reactive to light,  neck supple, throat within normal limits Cardiovascular: Normal rate, regular rhythm and normal heart sounds.  No murmur heard. No BLE edema. Pulmonary/Chest: Effort normal and breath sounds normal. No respiratory distress. Abdominal: Soft.  There is no tenderness. Psychiatric: Patient has a normal mood and affect. behavior is normal. Judgment and thought content normal.  Recent Results (from the past 2160 hour(s))  POCT HgB A1C     Status: Normal   Collection Time: 11/04/17  9:42 AM  Result Value Ref Range   Hemoglobin A1C     HbA1c POC (<> result, manual entry)     HbA1c, POC (prediabetic range)     HbA1c, POC (controlled diabetic range) 6.6 0.0 - 7.0 %    PHQ2/9: Depression screen [facility] 2/9 11/04/2017 03/29/2017 10/30/2016 04/30/2016 02/03/2016  Decreased Interest 0 0 0 0 0  Down, Depressed, Hopeless 0 0 0 0 0  PHQ - 2 Score 0 0 0 0 0     Fall Risk: Fall Risk  11/04/2017 09/10/2017 06/23/2017  03/29/2017 02/26/2017  Falls in the past year? No No No No No  Number falls in past yr: - - - - -  Injury with  Fall? - - - - -     Functional Status Survey: Is the patient deaf or have difficulty hearing?: No Does the patient have difficulty seeing, even when wearing glasses/contacts?: Yes(glasses) Does the patient have difficulty concentrating, remembering, or making decisions?: No Does the patient have difficulty walking or climbing stairs?: No Does the patient have difficulty dressing or bathing?: No Does the patient have difficulty doing errands alone such as visiting a doctor's office or shopping?: No    Assessment & Plan  1. Type 2 diabetes mellitus with microalbuminuria, without long-term current use of insulin (HCC)  - POCT HgB A1C - metFORMIN (GLUCOPHAGE-XR) 750 MG 24 hr tablet; Take 2 tablets (1,500 mg total) by mouth daily with breakfast.  Dispense: 180 tablet; Refill: 1  2. Benign essential HTN  - CBC with Differential/Platelet - COMPLETE METABOLIC PANEL WITH GFR - losartan-hydrochlorothiazide (HYZAAR) 100-25 MG tablet; Take 1 tablet by mouth daily.  Dispense: 90 tablet; Refill: 1  3. Dyslipidemia  - Lipid panel  4. Factor 5 Leiden mutation, heterozygous [facility])  Under the care of Dr. Mike Gip and taking Xarelto   5. Portal vein thrombosis   6. Vitamin D deficiency  On otc supplementation   7. Primary osteoarthritis of right knee  stable  8. Long-term use of high-risk medication  Recheck labs   9. Gastro-esophageal reflux disease without esophagitis

## 2017-11-04 NOTE — Progress Notes (Signed)
[facility] day:  11/04/17   Chief Complaint: Emily Pitts is a 62 y.o. female with Factor V Leiden and portal vein thrombosis who is seen for 6 month assessment on Xarelto.  HPI:   The patient was last seen in the hematology clinic on 05/06/2017.  At that time,  she decribed pain in her LEFT groin. She denied urinary symptoms.  Exam was unremarkable.  Labs were stable. She remained on Xarelto.  During the interim, she has felt "alright".  She notes some swelling at her ankles.  Her right knee hurts intermittently.  She denies any groin pain.  She denies any bruising or bleeding.   Past Medical History:  Diagnosis Date  . Allergy   . Diabetes mellitus without complication ([place]) 1610  . Hyperlipidemia   . Hypertension   . Low back pain, episodic   . Numbness of feet   . Vitamin D deficiency     Past Surgical History:  Procedure Laterality Date  . ABDOMINAL HYSTERECTOMY  2010  . APPENDECTOMY  1966  . COLONOSCOPY  06-07-13   Dr Bary Castilla  . DILATION AND CURETTAGE OF UTERUS      Family History  Problem Relation Age of Onset  . Diabetes Mother   . COPD Mother   . Diabetes Father   . Hypertension Father   . Cancer Father        Kidney and Prostate  . CAD Father   . Diabetes Brother        [employer]  . Kidney disease Brother        Tumor removed  . Breast cancer Neg Hx   Her grandfather had a CVA in his 29s.  A niece had an early pregnancy loss.   Social History:  reports that she has never smoked. She has never used smokeless tobacco. She reports that she does not drink alcohol or use drugs.  She does not have any children.  She lives in [place].  The patient is alone today.  Allergies: No Known Allergies  Current Medications: Current Outpatient Medications  Medication Sig Dispense Refill  . aspirin EC 81 MG tablet Take 81 mg by mouth daily.    Marland Kitchen atorvastatin (LIPITOR) 40 MG tablet TAKE 1 TABLET BY MOUTH ONCE DAILY 90  tablet 1  . chlorzoxazone (PARAFON) 500 MG tablet TAKE ONE TABLET BY MOUTH THREE TIMES DAILY 90 tablet 0  . cholecalciferol (VITAMIN D) 1000 units tablet Take 1,000 Units by mouth daily.    . diclofenac sodium (VOLTAREN) 1 % GEL Apply 4 g topically 4 (four) times daily. 100 g 1  . fluticasone (FLONASE) 50 MCG/ACT nasal spray Place 2 sprays into both nostrils as needed. 16 g 11  . glucose blood (ONE TOUCH ULTRA TEST) test strip Use as instructed 100 each 12  . glucose blood test strip Inject 1 each into the skin daily.    Marland Kitchen levocetirizine (XYZAL) 5 MG tablet Take 1 tablet (5 mg total) by mouth daily. 90 tablet 1  . losartan-hydrochlorothiazide (HYZAAR) 100-25 MG tablet Take 1 tablet by mouth daily. 90 tablet 1  . MAGNESIUM OXIDE PO Take 500 mg by mouth daily.     . Menthol, Topical Analgesic, 4 % GEL Apply 2 g topically 4 (four) times daily. 1 Tube 1  . metFORMIN (GLUCOPHAGE-XR) 750 MG 24 hr tablet Take 2 tablets (1,500 mg total) by mouth daily with breakfast. 180 tablet 1  . montelukast (SINGULAIR)  10 MG tablet Take 1 tablet (10 mg total) by mouth at bedtime. 90 tablet 1  . polyethylene glycol powder (GLYCOLAX/MIRALAX) powder Take 17 g by mouth 2 (two) times daily. 3350 g 1  . tiZANidine (ZANAFLEX) 2 MG tablet Take 1 tablet (2 mg total) by mouth at bedtime. 30 tablet 0  . Turmeric 500 MG TABS Take 1 tablet by mouth daily.    . vitamin C (ASCORBIC ACID) 500 MG tablet Take 500 mg by mouth daily.    Alveda Reasons 20 MG TABS tablet TAKE 1 TABLET BY MOUTH ONCE DAILY WITH  SUPPER 90 tablet 1   No current facility-administered medications for this visit.     Review of Systems:  GENERAL:  Feels "alright".  No fevers, sweats or weight loss.  Wight up 1 pound. PERFORMANCE STATUS (ECOG):  0 HEENT:  No visual changes, runny nose, sore throat, mouth sores or tenderness. Lungs: No shortness of breath or cough.  No hemoptysis. Cardiac:  No chest pain, palpitations, orthopnea, or PND. GI:  No nausea,  vomiting, diarrhea, constipation, melena or hematochezia.  Colonoscopy 06/2013. GU:  No urgency, frequency, dysuria, or hematuria. Musculoskeletal:  No back pain.  Right knee arthritis (hurts intermittently).  No muscle tenderness. Extremities:  No pain or swelling. Skin:  No rashes or skin changes. Neuro:  No headache, numbness or weakness, balance or coordination issues. Endocrine:  Diabetes.  No thyroid issues, hot flashes or night sweats. Psych:  No mood changes, depression or anxiety. Pain:  No focal pain. Review of systems:  All other systems reviewed and found to be negative.   Physical Exam: Blood pressure 125/77, pulse 89, temperature 98.4 F (36.9 C), temperature source Tympanic, resp. rate 16, weight 227 lb 9 oz (103.2 kg), SpO2 98 %. GENERAL:  Well developed, well nourished, woman sitting comfortably in the exam room in no acute distress. MENTAL STATUS:  Alert and oriented to person, place and time. HEAD:  Curly red hair.  Normocephalic, atraumatic, face symmetric, no Cushingoid features. EYES:  Brown eyes.  Pupils equal round and reactive to light and accomodation.  No conjunctivitis or scleral icterus. ENT:  Oropharynx clear without lesion.  Tongue normal. Mucous membranes moist.  RESPIRATORY:  Clear to auscultation without rales, wheezes or rhonchi. CARDIOVASCULAR:  Regular rate and rhythm without murmur, rub or gallop. ABDOMEN:  Soft, non-tender, with active bowel sounds, and no hepatosplenomegaly.  No masses. SKIN:  No rashes, ulcers or lesions. EXTREMITIES: No edema, no skin discoloration or tenderness.  No palpable cords. LYMPH NODES: No palpable cervical, supraclavicular, axillary or inguinal adenopathy  NEUROLOGICAL: Unremarkable. PSYCH:  Appropriate.    Office Visit on 11/04/2017  Component Date Value Ref Range Status  . HbA1c, POC (controlled diabetic ra* 11/04/2017 6.6  0.0 - 7.0 % Final    Assessment:  Emily Pitts is a 62 y.o. female with portal vein  thrombosis presenting with fever, chills, dehydration, weight loss, and abdominal pain.  Symptoms lasted for about 4 weeks.  Abdominal and pelvic CT on 11/07/2014 revealed a partially thrombosed portal veins (left > right).  The main portal vein and mesenteric veins remained patent.  Porta hepatis nodes measured 1.2 cm in short axis (stable).  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  The following studies were normal:  prothrombin gene mutation, lupus anticoagulant panel, anticardiolipin antibodies, homocysteine, protein C antigen (88%), protein C activity (111%), protein S antigen total (131%), protein S antigen free (108%), anti-thrombin III antigen (  88%), anti-thrombin III functional (101%).  On 12/11/2014, protein S antigen total (95%), protein S antigen free (77%), and protein S activity (69%) were normal.  PNH by flow cytometry was negative.  JAK2 V617F and exon 12 were negative on 05/02/2015.  Additional testing on 11/27/2014 revealed resolution of anemia.  The following studies were normal:  SPEP, free light chain ratio, 24 hour UPEP, IgG, IgA, ANA, ESR, ferritin, reticulocyte count, B12, and folate.  Serum protein electrophoresis revealed no monoclonal protein.  Kappa free light chains were 26.67, lambda free light chains 24.67 and ratio 1.08 (normal).   IgM was 247 (26-217).    She was started on Xarelto on 11/08/2014.  Plan is for long term anticoagulation given the unprovoked thrombus.  Ultrasound hepatic doppler on 05/02/2015 revealed no convincing evidence of occlusion or thrombus.  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 There was interval reconstitution of previously noted thrombosis within the peripheral branches of the right portal vein. The right and main portal veins appeared widely patent.  A discrete hepatic lesion/mass was not identified.  There was no evidence of  cirrhosis. There was unchanged borderline splenomegaly without evidence of gastroesophageal varices or ascites.  There was extensive colonic diverticulosis.  She denies any prior history of thrombosis.  She has not been on birth control pills.  She denies any family history of thrombosis.   Screening studies for malignancy were normal.  Chest x-ray on 11/05/2014 was negative.  Bilateral mammogram on 01/27/2017 demonstrated no mammographic evidence of malignancy.  Colonoscopy on 06/07/2013 revealed only diverticulosis.    Symptomatically, she denies any new complaints.  She denies any bruising or bleeding.  Exam is unremarkable.  Plan: 1.  Review results from lab draw with Dr Ancil Boozer today. 2.  Portal vein thrombosis:  Continue Xarelto. 3.  Continue lab draws every 3 months. 4.  RTC in 6 months for MD assessment and labs (CBC with diff, CMP).   Lequita Asal, MD  11/04/2017, 3:47 PM

## 2017-11-05 LAB — COMPLETE METABOLIC PANEL WITH GFR
AG RATIO: 1.4 (calc) (ref 1.0–2.5)
ALT: 20 U/L (ref 6–29)
AST: 20 U/L (ref 10–35)
Albumin: 4.2 g/dL (ref 3.6–5.1)
Alkaline phosphatase (APISO): 86 U/L (ref 33–130)
BUN: 12 mg/dL (ref 7–25)
CALCIUM: 10.1 mg/dL (ref 8.6–10.4)
CO2: 31 mmol/L (ref 20–32)
CREATININE: 0.82 mg/dL (ref 0.50–0.99)
Chloride: 102 mmol/L (ref 98–110)
GFR, EST AFRICAN AMERICAN: 88 mL/min/{1.73_m2} (ref >=60)
GFR, EST NON AFRICAN AMERICAN: 76 mL/min/{1.73_m2} (ref >=60)
Globulin: 2.9 g/dL (calc) (ref 1.9–3.7)
Glucose, Bld: 95 mg/dL (ref 65–99)
Potassium: 4.4 mmol/L (ref 3.5–5.3)
SODIUM: 141 mmol/L (ref 135–146)
Total Bilirubin: 0.6 mg/dL (ref 0.2–1.2)
Total Protein: 7.1 g/dL (ref 6.1–8.1)

## 2017-11-05 LAB — CBC WITH DIFFERENTIAL/PLATELET
BASOS ABS: 17 {cells}/uL (ref 0–200)
Basophils Relative: 0.2 %
Eosinophils Absolute: 100 cells/uL (ref 15–500)
Eosinophils Relative: 1.2 %
HCT: 34.9 % — ABNORMAL LOW (ref 35.0–45.0)
HEMOGLOBIN: 11.4 g/dL — AB (ref 11.7–15.5)
LYMPHS ABS: 1992 {cells}/uL (ref 850–3900)
MCH: 26 pg — ABNORMAL LOW (ref 27.0–33.0)
MCHC: 32.7 g/dL (ref 32.0–36.0)
MCV: 79.7 fL — ABNORMAL LOW (ref 80.0–100.0)
MONOS PCT: 10 %
MPV: 10.7 fL (ref 7.5–12.5)
NEUTROS ABS: 5362 {cells}/uL (ref 1500–7800)
Neutrophils Relative %: 64.6 %
Platelets: 259 10*3/uL (ref 140–400)
RBC: 4.38 10*6/uL (ref 3.80–5.10)
RDW: 14.2 % (ref 11.0–15.0)
Total Lymphocyte: 24 %
WBC mixed population: 830 cells/uL (ref 200–950)
WBC: 8.3 10*3/uL (ref 3.8–10.8)

## 2017-11-05 LAB — LIPID PANEL
CHOL/HDL RATIO: 2.4 (calc) (ref <5.0)
Cholesterol: 130 mg/dL (ref <200)
HDL: 55 mg/dL (ref >50)
LDL Cholesterol (Calc): 55 mg/dL (calc)
Non-HDL Cholesterol (Calc): 75 mg/dL (calc) (ref <130)
Triglycerides: 112 mg/dL (ref <150)

## 2017-12-28 ENCOUNTER — Other Ambulatory Visit: Payer: Self-pay | Admitting: Family Medicine

## 2017-12-28 DIAGNOSIS — Z1231 Encounter for screening mammogram for malignant neoplasm of breast: Secondary | ICD-10-CM

## 2018-01-28 ENCOUNTER — Ambulatory Visit
Admission: RE | Admit: 2018-01-28 | Discharge: 2018-01-28 | Disposition: A | Discharge status: Home / Self Care | Payer: BC Managed Care – PPO | Source: Ambulatory Visit | Attending: Family Medicine | Admitting: Family Medicine

## 2018-01-28 DIAGNOSIS — Z1231 Encounter for screening mammogram for malignant neoplasm of breast: Secondary | ICD-10-CM | POA: Diagnosis not present

## 2018-02-02 ENCOUNTER — Other Ambulatory Visit: Payer: Self-pay | Admitting: Family Medicine

## 2018-02-02 DIAGNOSIS — J302 Other seasonal allergic rhinitis: Secondary | ICD-10-CM

## 2018-02-02 DIAGNOSIS — J3089 Other allergic rhinitis: Principal | ICD-10-CM

## 2018-02-07 ENCOUNTER — Other Ambulatory Visit: Payer: Self-pay | Admitting: Family Medicine

## 2018-02-07 DIAGNOSIS — R809 Proteinuria, unspecified: Principal | ICD-10-CM

## 2018-02-07 DIAGNOSIS — E1129 Type 2 diabetes mellitus with other diabetic kidney complication: Secondary | ICD-10-CM

## 2018-02-08 ENCOUNTER — Ambulatory Visit: Payer: BC Managed Care – PPO | Admitting: Family Medicine

## 2018-02-08 ENCOUNTER — Encounter: Payer: Self-pay | Admitting: Family Medicine

## 2018-02-08 VITALS — BP 134/62 | HR 96 | Temp 98.4°F | Resp 18 | Ht 69.0 in | Wt 225.8 lb

## 2018-02-08 DIAGNOSIS — D649 Anemia, unspecified: Secondary | ICD-10-CM

## 2018-02-08 DIAGNOSIS — J302 Other seasonal allergic rhinitis: Secondary | ICD-10-CM

## 2018-02-08 DIAGNOSIS — E785 Hyperlipidemia, unspecified: Secondary | ICD-10-CM

## 2018-02-08 DIAGNOSIS — R809 Proteinuria, unspecified: Secondary | ICD-10-CM

## 2018-02-08 DIAGNOSIS — I10 Essential (primary) hypertension: Secondary | ICD-10-CM

## 2018-02-08 DIAGNOSIS — E1129 Type 2 diabetes mellitus with other diabetic kidney complication: Secondary | ICD-10-CM

## 2018-02-08 DIAGNOSIS — E559 Vitamin D deficiency, unspecified: Secondary | ICD-10-CM | POA: Diagnosis not present

## 2018-02-08 DIAGNOSIS — D6851 Activated protein C resistance: Secondary | ICD-10-CM

## 2018-02-08 DIAGNOSIS — I81 Portal vein thrombosis: Secondary | ICD-10-CM

## 2018-02-08 DIAGNOSIS — Z79899 Other long term (current) drug therapy: Secondary | ICD-10-CM

## 2018-02-08 DIAGNOSIS — J3089 Other allergic rhinitis: Secondary | ICD-10-CM

## 2018-02-08 LAB — POCT GLYCOSYLATED HEMOGLOBIN (HGB A1C): HbA1c, POC (controlled diabetic range): 6.5 % (ref 0.0–7.0)

## 2018-02-08 MED ORDER — METFORMIN HCL ER 750 MG PO TB24: 1500.0000 mg | ORAL_TABLET | Freq: Every day | ORAL | 1 refills | Status: DC

## 2018-02-08 MED ORDER — LOSARTAN POTASSIUM-HCTZ 100-25 MG PO TABS: 1.0000 | ORAL_TABLET | Freq: Every day | ORAL | 1 refills | Status: DC

## 2018-02-08 NOTE — Progress Notes (Signed)
Name: Emily Pitts   MRN: [id number]    DOB: 03-20-1955   Date:02/08/2018       Progress Note  Subjective  Chief Complaint  Chief Complaint  Patient presents with  . Medication Refill  . Hypertension    Denies any symptoms  . Diabetes    Checks every couple of days, Average-135-140  . Hyperlipidemia  . Gastroesophageal Reflux    Tums as needed  . Constipation    Comes and goes-takes Miralax as needed    HPI   Diabetes Type II with microalbuminuria: she is on Metformin 500 ER two pills daily hgbA1C 6.9% toto 6.6%,6.6%, 6.3% 6.6%, today it is 6.5%. She has noticed that glucose is usually high in am's average is still spiking up to 199, but usually 170-180's, but latera in am is down to 130-140's Highest over the past few months was 199  and lowest 113. We will change Metformin to ER 750 and take both pills at the same time. She deniesblurred vision, polyphagia, polyuria or polydipsia. Chronic nocturia once per night - she drinks a lot of water in the evenings. She is on ARB . 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 She lost 4 lbs during the Summer, and another 2 lbs since last visit, she states was more active during the Summer but not now. She states she is trying to eat smaller portion and drinking less sodas. Down to one to two a week.   HTN: she is on Losartan/hctz, she is taking one pill daily,no chest pain, palpitation or SOB. BP is at goal. No recent episodes of dizziness. BP is at goal    Hyperlipidemia: she is now on Atorvastatin and denies side effects.Reviewed labs done 10/2017 and LDL was 55 and HDL 55 under good control   GERD: she stopped taking Omeprazole, controlled with life style modification, no heartburn or regurgitation, very seldom has to take Tums. Unchanged   Perennial AR with  seasonal variation: Occasional sneezing, rhinorrhea and headaches, worse in the Spring and Fall. Still taking Xyzal but only occasionally, takes singulair daily. Symptoms under control at this time  Portal Vein Thrombosis: seen hematologistDr. Corcroan, taking Xarelto, denies any bleeding.  She has elevated factor V leiden. Read Dr. Kem Parkinson note and I will check CBC and comp panel as recommended every 3 months and send her the report to decrease frequency of visits with hematologist. Last CBC showed mild anemia, today I will check CBC and iron studies and also comp panel and send a copy as requested   Constipation: she has history of intermittent constipation and usually responds to Miralax or other otc medications,symptoms were worse around Dec 2018, but back to baseline, [place] usually a 4 with medication. Unchanged   Neck pain: very seldom has problems, no longer using muscle relaxer   Right knee OA: seen by Ortho, had PT and is doing better, still has intermittent  episodes of effusion and pain, cannot take nsaid's because she is on Xarelto and has a risk of bleeding, taking Tylenol and or topical medication prn and is doing well at this time   Patient Active Problem List   Diagnosis Date Noted  . 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 Long term current use of anticoagulant therapy 01/05/2016  . Factor 5 Leiden mutation, heterozygous ([place]) 05/02/2015  .  Portal vein thrombosis 11/07/2014  . History of sepsis 10/10/2014  . Abnormal electrocardiogram 10/08/2014  . Nonspecific abnormal electromyogram (EMG) 10/08/2014  . Benign essential HTN 10/08/2014  . Diabetes mellitus with renal manifestation ([place]) 10/08/2014  . Dyslipidemia 10/08/2014  . LBP (low back pain) 10/08/2014  . Gastro-esophageal reflux disease without esophagitis 10/08/2014  . Microalbuminuria 10/08/2014  . Disturbance of skin sensation 10/08/2014  . Obesity (BMI 30-39.9) 10/08/2014  . Perennial allergic  rhinitis with seasonal variation 10/08/2014  . Plantar fasciitis 10/08/2014  . Postablative ovarian failure 10/08/2014  . Menopausal symptom 10/08/2014  . Vitamin D deficiency 10/08/2014    Past Surgical History:  Procedure Laterality Date  . ABDOMINAL HYSTERECTOMY  2010  . APPENDECTOMY  1966  . COLONOSCOPY  06-07-13   Dr Bary Castilla  . DILATION AND CURETTAGE OF UTERUS    . OOPHORECTOMY      Family History  Problem Relation Age of Onset  . Diabetes Mother   . COPD Mother   . Diabetes Father   . Hypertension Father   . Cancer Father        Kidney and Prostate  . CAD Father   . Diabetes Brother        [employer]  . Kidney disease Brother        Tumor removed  . Breast cancer Neg Hx     Social History   Socioeconomic History  . Marital status: Single    Spouse name: Not on file  . Number of children: 0  . Years of education: Not on file  . Highest education level: 12th grade  Occupational History  . Not on file  Social Needs  . Financial resource strain: Not hard at all  . Food insecurity:    Worry: Never true    Inability: Never true  . Transportation needs:    Medical: No    Non-medical: No  Tobacco Use  . Smoking status: Never Smoker  . Smokeless tobacco: Never Used  Substance and Sexual Activity  . Alcohol use: No    Alcohol/week: 0.0 standard drinks  . Drug use: No  . Sexual activity: Never  Lifestyle  . Physical activity:    Days per week: 5 days    Minutes per session: 10 min  . Stress: Not at all  Relationships  . Social connections:    Talks on phone: Three times a week    Gets together: More than three times a week    Attends religious service: More than 4 times per year    Active member of club or organization: Yes    Attends meetings of clubs or organizations: More than 4 times per year    Relationship status: Never married  . Intimate partner violence:    Fear of current or ex partner: No    Emotionally abused: No    Physically  abused: No    Forced sexual activity: No  Other Topics Concern  . Not on file  Social History Narrative  . Not on file     Current Outpatient Medications:  .  aspirin EC 81 MG tablet, Take 81 mg by mouth daily., Disp: , Rfl:  .  atorvastatin (LIPITOR) 40 MG tablet, TAKE 1 TABLET BY MOUTH ONCE DAILY, Disp: 90 tablet, Rfl: 1 .  chlorzoxazone (PARAFON) 500 MG tablet, TAKE ONE TABLET BY MOUTH THREE TIMES DAILY, Disp: 90 tablet, Rfl: 0 .  cholecalciferol (VITAMIN D) 1000 units tablet, Take 1,000 Units by mouth daily., Disp: ,  Rfl:  .  diclofenac sodium (VOLTAREN) 1 % GEL, Apply 4 g topically 4 (four) times daily., Disp: 100 g, Rfl: 1 .  fluticasone (FLONASE) 50 MCG/ACT nasal spray, Place 2 sprays into both nostrils as needed., Disp: 16 g, Rfl: 11 .  glucose blood (ONE TOUCH ULTRA TEST) test strip, USE AS DIRECTED, Disp: 100 each, Rfl: 12 .  glucose blood test strip, Inject 1 each into the skin daily., Disp: , Rfl:  .  levocetirizine (XYZAL) 5 MG tablet, Take 1 tablet (5 mg total) by mouth daily., Disp: 90 tablet, Rfl: 1 .  losartan-hydrochlorothiazide (HYZAAR) 100-25 MG tablet, Take 1 tablet by mouth daily., Disp: 90 tablet, Rfl: 1 .  MAGNESIUM OXIDE PO, Take 500 mg by mouth daily. , Disp: , Rfl:  .  Menthol, Topical Analgesic, 4 % GEL, Apply 2 g topically 4 (four) times daily., Disp: 1 Tube, Rfl: 1 .  metFORMIN (GLUCOPHAGE-XR) 750 MG 24 hr tablet, Take 2 tablets (1,500 mg total) by mouth daily with breakfast., Disp: 180 tablet, Rfl: 1 .  montelukast (SINGULAIR) 10 MG tablet, TAKE 1 TABLET BY MOUTH AT BEDTIME, Disp: 90 tablet, Rfl: 1 .  polyethylene glycol powder (GLYCOLAX/MIRALAX) powder, Take 17 g by mouth 2 (two) times daily., Disp: 3350 g, Rfl: 1 .  Turmeric 500 MG TABS, Take 1 tablet by mouth daily., Disp: , Rfl:  .  vitamin C (ASCORBIC ACID) 500 MG tablet, Take 500 mg by mouth daily., Disp: , Rfl:  .  XARELTO 20 MG TABS tablet, TAKE 1 TABLET BY MOUTH ONCE DAILY WITH  SUPPER, Disp: 90  tablet, Rfl: 1  No Known Allergies  I personally reviewed active problem list, medication list, allergies, family history, social history with the patient/caregiver today.   ROS  Constitutional: Negative for fever or weight change.  Respiratory: Negative for cough and shortness of breath.   Cardiovascular: Negative for chest pain or palpitations.  Gastrointestinal: Negative for abdominal pain, no bowel changes.  Musculoskeletal: Negative for gait problem or joint swelling.  Skin: Negative for rash.  Neurological: Negative for dizziness or headache.  No other specific complaints in a complete review of systems (except as listed in HPI above).   Objective  Vitals:   02/08/18 1504 02/08/18 1522  BP: 134/62   Pulse: (!) 112 96  Resp: 18   Temp: 98.4 F (36.9 C)   TempSrc: Oral   SpO2: 97%   Weight: 225 lb 12.8 oz (102.4 kg)   Height: 5\' 9"  (1.753 m)     Body mass index is 33.34 kg/m.  Physical Exam  Constitutional: Patient appears well-developed and well-nourished. Obese  No distress.  HEENT: head atraumatic, normocephalic, pupils equal and reactive to light,  neck supple, throat within normal limits Cardiovascular: Normal rate, regular rhythm and normal heart sounds.  No murmur heard. No BLE edema. Pulmonary/Chest: Effort normal and breath sounds normal. No respiratory distress. Abdominal: Soft.  There is no tenderness. Psychiatric: Patient has a normal mood and affect. behavior is normal. Judgment and thought content normal.  PHQ2/9: Depression screen [facility] 2/9 11/04/2017 03/29/2017 10/30/2016 04/30/2016 02/03/2016  Decreased Interest 0 0 0 0 0  Down, Depressed, Hopeless 0 0 0 0 0  PHQ - 2 Score 0 0 0 0 0     Fall Risk: Fall Risk  11/04/2017 09/10/2017 06/23/2017 03/29/2017 02/26/2017  Falls in the past year? No No No No No  Number falls in past yr: - - - - -  Injury with Fall? - - - - -  Assessment & Plan  1. Type 2 diabetes mellitus with microalbuminuria,  without long-term current use of insulin (HCC)  - POCT HgB A1C - metFORMIN (GLUCOPHAGE-XR) 750 MG 24 hr tablet; Take 2 tablets (1,500 mg total) by mouth daily with breakfast.  Dispense: 180 tablet; Refill: 1  2. Benign essential HTN  - losartan-hydrochlorothiazide (HYZAAR) 100-25 MG tablet; Take 1 tablet by mouth daily.  Dispense: 90 tablet; Refill: 1  3. Vitamin D deficiency  Taking otc vitamin D   4. Portal vein thrombosis  No symptoms at this time  5. Factor 5 Leiden mutation, heterozygous [facility])  Keep follow up with Dr. Mike Gip   6. Dyslipidemia  Taking atorvastatin   7. Perennial allergic rhinitis with seasonal variation  Doing well on singulair   8. Anemia, unspecified type  - CBC with Differential/Platelet - Iron, TIBC and Ferritin Panel

## 2018-02-09 LAB — CBC WITH DIFFERENTIAL/PLATELET
Basophils Absolute: 40 cells/uL (ref 0–200)
Basophils Relative: 0.4 %
Eosinophils Absolute: 129 cells/uL (ref 15–500)
Eosinophils Relative: 1.3 %
HCT: 36.7 % (ref 35.0–45.0)
Hemoglobin: 12 g/dL (ref 11.7–15.5)
Lymphs Abs: 2802 cells/uL (ref 850–3900)
MCH: 24.6 pg — ABNORMAL LOW (ref 27.0–33.0)
MCHC: 32.7 g/dL (ref 32.0–36.0)
MCV: 75.2 fL — AB (ref 80.0–100.0)
MPV: 10.6 fL (ref 7.5–12.5)
Monocytes Relative: 9.3 %
Neutro Abs: 6009 cells/uL (ref 1500–7800)
Neutrophils Relative %: 60.7 %
Platelets: 268 10*3/uL (ref 140–400)
RBC: 4.88 10*6/uL (ref 3.80–5.10)
RDW: 15.9 % — ABNORMAL HIGH (ref 11.0–15.0)
TOTAL LYMPHOCYTE: 28.3 %
WBC: 9.9 10*3/uL (ref 3.8–10.8)
WBCMIX: 921 {cells}/uL (ref 200–950)

## 2018-02-09 LAB — COMPLETE METABOLIC PANEL WITH GFR
AG Ratio: 1.6 (calc) (ref 1.0–2.5)
ALT: 18 U/L (ref 6–29)
AST: 17 U/L (ref 10–35)
Albumin: 4.5 g/dL (ref 3.6–5.1)
Alkaline phosphatase (APISO): 84 U/L (ref 33–130)
BUN: 17 mg/dL (ref 7–25)
CO2: 29 mmol/L (ref 20–32)
Calcium: 10.2 mg/dL (ref 8.6–10.4)
Chloride: 99 mmol/L (ref 98–110)
Creat: 0.72 mg/dL (ref 0.50–0.99)
GFR, Est African American: 103 mL/min/{1.73_m2} (ref >=60)
GFR, Est Non African American: 89 mL/min/{1.73_m2} (ref >=60)
GLOBULIN: 2.9 g/dL (ref 1.9–3.7)
Glucose, Bld: 90 mg/dL (ref 65–99)
Potassium: 4.2 mmol/L (ref 3.5–5.3)
Sodium: 137 mmol/L (ref 135–146)
Total Bilirubin: 0.4 mg/dL (ref 0.2–1.2)
Total Protein: 7.4 g/dL (ref 6.1–8.1)

## 2018-02-09 LAB — IRON,TIBC AND FERRITIN PANEL
%SAT: 8 % (calc) — ABNORMAL LOW (ref 16–45)
Ferritin: 7 ng/mL — ABNORMAL LOW (ref 16–288)
Iron: 34 ug/dL — ABNORMAL LOW (ref 45–160)
TIBC: 411 mcg/dL (calc) (ref 250–450)

## 2018-02-14 ENCOUNTER — Other Ambulatory Visit: Payer: Self-pay | Admitting: Hematology and Oncology

## 2018-02-14 ENCOUNTER — Telehealth: Payer: Self-pay | Admitting: *Deleted

## 2018-02-14 DIAGNOSIS — D509 Iron deficiency anemia, unspecified: Secondary | ICD-10-CM

## 2018-02-14 NOTE — Telephone Encounter (Signed)
Per Gaspar Bidding 02/14/18 Result Notes to Schedule MD and +/- Venofer.  Called patient to see what date this week or early next week to get her Scheduled as requested. Left her a message to give the office a call back to be scheduled.

## 2018-02-18 ENCOUNTER — Telehealth: Payer: Self-pay | Admitting: Family Medicine

## 2018-02-18 ENCOUNTER — Other Ambulatory Visit: Payer: Self-pay

## 2018-02-18 NOTE — Telephone Encounter (Signed)
Medication can't get the combination in stock. Can [facility] split them into two?

## 2018-02-18 NOTE — Telephone Encounter (Signed)
Copied from [place] [id number]. Topic: Quick Communication - Rx Refill/Question >> Feb 18, 2018  3:36 PM Judyann Munson wrote: Medication: losartan-hydrochlorothiazide (HYZAAR) 100-25 MG tablet  Has the patient contacted their pharmacy? Yes   Preferred Pharmacy (with phone number or street name): [facility] [address], [place] - [place] [phone] (Phone) [phone] (Fax)    Agent: Please be advised that RX refills may take up to 3 business days. We ask that you follow-up with your pharmacy.

## 2018-02-20 ENCOUNTER — Other Ambulatory Visit: Payer: Self-pay | Admitting: Family Medicine

## 2018-02-20 DIAGNOSIS — I10 Essential (primary) hypertension: Secondary | ICD-10-CM

## 2018-02-20 MED ORDER — OLMESARTAN MEDOXOMIL-HCTZ 20-12.5 MG PO TABS: 1.0000 | ORAL_TABLET | Freq: Every day | ORAL | 0 refills | Status: DC

## 2018-02-21 NOTE — Telephone Encounter (Signed)
Pt states the combo medication that was refilled is on back order so [facility] requested this split into two medications, but it was denied.  Pt states she has been without her blood pressure medication for 4 days now and wants to know what she needs to do to get it filled.

## 2018-02-21 NOTE — Telephone Encounter (Signed)
Changed to combo to simplify but if she prefers I can give the split medications

## 2018-02-21 NOTE — Telephone Encounter (Signed)
Changed to Benicar HCTZ

## 2018-04-14 ENCOUNTER — Other Ambulatory Visit: Payer: Self-pay | Admitting: Family Medicine

## 2018-04-14 DIAGNOSIS — E785 Hyperlipidemia, unspecified: Secondary | ICD-10-CM

## 2018-04-19 ENCOUNTER — Other Ambulatory Visit: Payer: Self-pay | Admitting: Hematology and Oncology

## 2018-04-19 DIAGNOSIS — I81 Portal vein thrombosis: Secondary | ICD-10-CM

## 2018-04-19 DIAGNOSIS — D6851 Activated protein C resistance: Secondary | ICD-10-CM

## 2018-05-05 ENCOUNTER — Inpatient Hospital Stay: Payer: BC Managed Care – PPO | Attending: Hematology and Oncology

## 2018-05-05 ENCOUNTER — Ambulatory Visit: Payer: BC Managed Care – PPO | Admitting: Hematology and Oncology

## 2018-05-05 ENCOUNTER — Other Ambulatory Visit: Payer: BC Managed Care – PPO

## 2018-05-05 ENCOUNTER — Inpatient Hospital Stay: Payer: BC Managed Care – PPO | Attending: Hematology and Oncology | Admitting: Hematology and Oncology

## 2018-05-05 VITALS — BP 127/76 | HR 80 | Temp 97.8°F | Resp 16 | Wt 229.5 lb

## 2018-05-05 DIAGNOSIS — Z7901 Long term (current) use of anticoagulants: Secondary | ICD-10-CM | POA: Insufficient documentation

## 2018-05-05 DIAGNOSIS — D509 Iron deficiency anemia, unspecified: Secondary | ICD-10-CM | POA: Diagnosis not present

## 2018-05-05 DIAGNOSIS — I81 Portal vein thrombosis: Secondary | ICD-10-CM

## 2018-05-05 DIAGNOSIS — D6851 Activated protein C resistance: Secondary | ICD-10-CM | POA: Diagnosis present

## 2018-05-05 LAB — COMPREHENSIVE METABOLIC PANEL
ALT: 28 U/L (ref 0–44)
AST: 22 U/L (ref 15–41)
Albumin: 4.1 g/dL (ref 3.5–5.0)
Alkaline Phosphatase: 84 U/L (ref 38–126)
Anion gap: 9 (ref 5–15)
BUN: 14 mg/dL (ref 8–23)
CO2: 29 mmol/L (ref 22–32)
Calcium: 9.5 mg/dL (ref 8.9–10.3)
Chloride: 99 mmol/L (ref 98–111)
Creatinine, Ser: 0.7 mg/dL (ref 0.44–1.00)
GFR calc Af Amer: >60 mL/min (ref >60)
GFR calc non Af Amer: >60 mL/min (ref >60)
Glucose, Bld: 135 mg/dL — ABNORMAL HIGH (ref 70–99)
Potassium: 4.5 mmol/L (ref 3.5–5.1)
Sodium: 137 mmol/L (ref 135–145)
Total Bilirubin: 0.5 mg/dL (ref 0.3–1.2)
Total Protein: 7.7 g/dL (ref 6.5–8.1)

## 2018-05-05 LAB — CBC WITH DIFFERENTIAL/PLATELET
Abs Immature Granulocytes: 0.05 10*3/uL (ref 0.00–0.07)
Basophils Absolute: 0 10*3/uL (ref 0.0–0.1)
Basophils Relative: 0 %
Eosinophils Absolute: 0.2 10*3/uL (ref 0.0–0.5)
Eosinophils Relative: 2 %
HCT: 37.1 % (ref 36.0–46.0)
Hemoglobin: 11.9 g/dL — ABNORMAL LOW (ref 12.0–15.0)
Immature Granulocytes: 1 %
Lymphocytes Relative: 27 %
Lymphs Abs: 2.4 10*3/uL (ref 0.7–4.0)
MCH: 26.3 pg (ref 26.0–34.0)
MCHC: 32.1 g/dL (ref 30.0–36.0)
MCV: 81.9 fL (ref 80.0–100.0)
Monocytes Absolute: 0.8 10*3/uL (ref 0.1–1.0)
Monocytes Relative: 9 %
Neutro Abs: 5.4 10*3/uL (ref 1.7–7.7)
Neutrophils Relative %: 61 %
Platelets: 234 10*3/uL (ref 150–400)
RBC: 4.53 MIL/uL (ref 3.87–5.11)
RDW: 16 % — ABNORMAL HIGH (ref 11.5–15.5)
WBC: 8.9 10*3/uL (ref 4.0–10.5)
nRBC: 0 % (ref 0.0–0.2)

## 2018-05-05 LAB — FERRITIN: Ferritin: 6 ng/mL — ABNORMAL LOW (ref 11–307)

## 2018-05-05 NOTE — Progress Notes (Signed)
Pt here for follow up. Denies any concerns at this time.  

## 2018-05-05 NOTE — Progress Notes (Signed)
[facility] day:  05/05/18  Chief Complaint: Emily Pitts is a 63 y.o. female with Factor V Leiden and portal vein thrombosis who is seen for 6 month assessment on Xarelto.  HPI:   The patient was last seen in the hematology clinic on 11/04/2017.  At that time, she denied any new complaints.  She denied any bruising or bleeding.  Exam was unremarkable.  During the interim, she has felt the "same ole, same ole".    She denies any excess bruising or bleeding on Xarelto.  She denies any shortness of breath, abdominal pain or lower extremity swelling.  She has right knee arthritis for which she takes Tylenol as needed.   Past Medical History:  Diagnosis Date  . Allergy   . Diabetes mellitus without complication ([place]) 6734  . Hyperlipidemia   . Hypertension   . Low back pain, episodic   . Numbness of feet   . Vitamin D deficiency     Past Surgical History:  Procedure Laterality Date  . ABDOMINAL HYSTERECTOMY  2010  . APPENDECTOMY  1966  . COLONOSCOPY  06-07-13   Dr Bary Castilla  . DILATION AND CURETTAGE OF UTERUS    . OOPHORECTOMY      Family History  Problem Relation Age of Onset  . Diabetes Mother   . COPD Mother   . Diabetes Father   . Hypertension Father   . Cancer Father        Kidney and Prostate  . CAD Father   . Diabetes Brother        [employer]  . Kidney disease Brother        Tumor removed  . Breast cancer Neg Hx   Her grandfather had a CVA in his 50s.  A niece had an early pregnancy loss.   Social History:  reports that she has never smoked. She has never used smokeless tobacco. She reports that she does not drink alcohol or use drugs.  She does not have any children.  She lives in [place].  The patient is alone today.  Allergies: No Known Allergies  Current Medications: Current Outpatient Medications  Medication Sig Dispense Refill  . aspirin EC 81 MG tablet Take 81 mg by mouth daily.    . cholecalciferol  (VITAMIN D) 1000 units tablet Take 1,000 Units by mouth daily.    . diclofenac sodium (VOLTAREN) 1 % GEL Apply 4 g topically 4 (four) times daily. (Patient taking differently: Apply 4 g topically as needed. ) 100 g 1  . glucose blood (ONE TOUCH ULTRA TEST) test strip USE AS DIRECTED 100 each 12  . glucose blood test strip Inject 1 each into the skin daily.    Marland Kitchen levocetirizine (XYZAL) 5 MG tablet Take 1 tablet (5 mg total) by mouth daily. 90 tablet 1  . MAGNESIUM OXIDE PO Take 500 mg by mouth daily.     . Menthol, Topical Analgesic, 4 % GEL Apply 2 g topically 4 (four) times daily. 1 Tube 1  . metFORMIN (GLUCOPHAGE-XR) 750 MG 24 hr tablet Take 2 tablets (1,500 mg total) by mouth daily with breakfast. 180 tablet 1  . polyethylene glycol powder (GLYCOLAX/MIRALAX) powder Take 17 g by mouth 2 (two) times daily. (Patient taking differently: Take 17 g by mouth as needed. ) 3350 g 1  . Turmeric 500 MG TABS Take 1 tablet by mouth daily.    Marland Kitchen atorvastatin (LIPITOR) 40 MG tablet Take 1  tablet (40 mg total) by mouth daily. 90 tablet 1  . chlorzoxazone (PARAFON) 500 MG tablet Take 1 tablet (500 mg total) by mouth 3 (three) times daily as needed. 30 tablet 0  . fluticasone (FLONASE) 50 MCG/ACT nasal spray Place 2 sprays into both nostrils as needed. 16 g 2  . montelukast (SINGULAIR) 10 MG tablet Take 1 tablet (10 mg total) by mouth at bedtime. 90 tablet 1  . olmesartan-hydrochlorothiazide (BENICAR HCT) 20-12.5 MG tablet Take 1 tablet by mouth daily. 90 tablet 1  . rivaroxaban (XARELTO) 20 MG TABS tablet TAKE 1 TABLET BY MOUTH ONCE DAILY WITH  SUPPER 90 tablet 0   No current facility-administered medications for this visit.     Review of Systems:  GENERAL:  Feels "the same".  No fevers, sweats.  Weight up 2 pounds. PERFORMANCE STATUS (ECOG):  0 HEENT:  No visual changes, runny nose, sore throat, mouth sores or tenderness. Lungs: No shortness of breath or cough.  No hemoptysis. Cardiac:  No chest pain,  palpitations, orthopnea, or PND. GI:  No nausea, vomiting, diarrhea, constipation, melena or hematochezia.  Colonoscopy 06/2013. GU:  No urgency, frequency, dysuria, or hematuria. Musculoskeletal:  No back pain.  Right knee arthritis.  No muscle tenderness. Extremities:  No pain or swelling. Skin:  No rashes or skin changes. Neuro:  No headache, numbness or weakness, balance or coordination issues. Endocrine:  Diabetes.  No thyroid issues, hot flashes or night sweats. Psych:  No mood changes, depression or anxiety. Pain:  No focal pain. Review of systems:  All other systems reviewed and found to be negative.   Physical Exam: Blood pressure 127/76, pulse 80, temperature 97.8 F (36.6 C), temperature source Oral, resp. rate 16, weight 229 lb 8 oz (104.1 kg), SpO2 97 %. GENERAL:  Well developed, well nourished, woman sitting comfortably in the exam room in no acute distress. MENTAL STATUS:  Alert and oriented to person, place and time. HEAD:  Curly red hair.  Normocephalic, atraumatic, face symmetric, no Cushingoid features. EYES:  Brown eyes.  Pupils equal round and reactive to light and accomodation.  No conjunctivitis or scleral icterus. ENT:  Oropharynx clear without lesion.  Tongue normal. Mucous membranes moist.  RESPIRATORY:  Clear to auscultation without rales, wheezes or rhonchi. CARDIOVASCULAR:  Regular rate and rhythm without murmur, rub or gallop. ABDOMEN:  Soft, non-tender, with active bowel sounds, and no hepatosplenomegaly.  No masses. SKIN:  No rashes, ulcers or lesions. EXTREMITIES: No edema, no skin discoloration or tenderness.  No palpable cords. LYMPH NODES: No palpable cervical, supraclavicular, axillary or inguinal adenopathy  NEUROLOGICAL: Unremarkable. PSYCH:  Appropriate.    Appointment on 05/05/2018  Component Date Value Ref Range Status  . Sodium 05/05/2018 137  135 - 145 mmol/L Final  . Potassium 05/05/2018 4.5  3.5 - 5.1 mmol/L Final  . Chloride 05/05/2018  99  98 - 111 mmol/L Final  . CO2 05/05/2018 29  22 - 32 mmol/L Final  . Glucose, Bld 05/05/2018 135* 70 - 99 mg/dL Final  . BUN 05/05/2018 14  8 - 23 mg/dL Final  . Creatinine, Ser 05/05/2018 0.70  0.44 - 1.00 mg/dL Final  . Calcium 05/05/2018 9.5  8.9 - 10.3 mg/dL Final  . Total Protein 05/05/2018 7.7  6.5 - 8.1 g/dL Final  . Albumin 05/05/2018 4.1  3.5 - 5.0 g/dL Final  . AST 05/05/2018 22  15 - 41 U/L Final  . ALT 05/05/2018 28  0 - 44 U/L Final  .  Alkaline Phosphatase 05/05/2018 84  38 - 126 U/L Final  . Total Bilirubin 05/05/2018 0.5  0.3 - 1.2 mg/dL Final  . GFR calc non Af Amer 05/05/2018 >60  >60 mL/min Final  . GFR calc Af Amer 05/05/2018 >60  >60 mL/min Final  . Anion gap 05/05/2018 9  5 - 15 Final   Performed at [facility], [address], [place], [place] [address]  . WBC 05/05/2018 8.9  4.0 - 10.5 K/uL Final  . RBC 05/05/2018 4.53  3.87 - 5.11 MIL/uL Final  . Hemoglobin 05/05/2018 11.9* 12.0 - 15.0 g/dL Final  . HCT 05/05/2018 37.1  36.0 - 46.0 % Final  . MCV 05/05/2018 81.9  80.0 - 100.0 fL Final  . MCH 05/05/2018 26.3  26.0 - 34.0 pg Final  . MCHC 05/05/2018 32.1  30.0 - 36.0 g/dL Final  . RDW 05/05/2018 16.0* 11.5 - 15.5 % Final  . Platelets 05/05/2018 234  150 - 400 K/uL Final  . nRBC 05/05/2018 0.0  0.0 - 0.2 % Final  . Neutrophils Relative % 05/05/2018 61  % Final  . Neutro Abs 05/05/2018 5.4  1.7 - 7.7 K/uL Final  . Lymphocytes Relative 05/05/2018 27  % Final  . Lymphs Abs 05/05/2018 2.4  0.7 - 4.0 K/uL Final  . Monocytes Relative 05/05/2018 9  % Final  . Monocytes Absolute 05/05/2018 0.8  0.1 - 1.0 K/uL Final  . Eosinophils Relative 05/05/2018 2  % Final  . Eosinophils Absolute 05/05/2018 0.2  0.0 - 0.5 K/uL Final  . Basophils Relative 05/05/2018 0  % Final  . Basophils Absolute 05/05/2018 0.0  0.0 - 0.1 K/uL Final  . Immature Granulocytes 05/05/2018 1  % Final  . Abs Immature Granulocytes 05/05/2018 0.05  0.00 - 0.07 K/uL Final   Performed at  [facility], [address], [place], [place] [address]  . Ferritin 05/05/2018 6* 11 - 307 ng/mL Final   Performed at [facility], [address] [place] [place],  [address]    Assessment:  DEVLIN MCVEIGH is a 63 y.o. female with portal vein thrombosis presenting with fever, chills, dehydration, weight loss, and abdominal pain.  Symptoms lasted for about 4 weeks.  Abdominal and pelvic CT on 11/07/2014 revealed a partially thrombosed portal veins (left > right).  The main portal vein and mesenteric veins remained patent.  Porta hepatis nodes measured 1.2 cm in short axis (stable).  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  The following studies were normal:  prothrombin gene mutation, lupus anticoagulant panel, anticardiolipin antibodies, homocysteine, protein C antigen (88%), protein C activity (111%), protein S antigen total (131%), protein S antigen free (108%), anti-thrombin III antigen (88%), anti-thrombin III functional (101%).  On 12/11/2014, protein S antigen total (95%), protein S antigen free (77%), and protein S activity (69%) were normal.  PNH by flow cytometry was negative.  JAK2 V617F and exon 12 were negative on 05/02/2015.  Additional testing on 11/27/2014 revealed resolution of anemia.  The following studies were normal:  SPEP, free light chain ratio, 24 hour UPEP, IgG, IgA, ANA, ESR, ferritin, reticulocyte count, B12, and folate.  Serum protein electrophoresis revealed no monoclonal protein.  Kappa free light chains were 26.67, lambda free light chains 24.67 and ratio 1.08 (normal).   IgM was 247 (26-217).    She was started on Xarelto on 11/08/2014.  Plan is for long term anticoagulation given the unprovoked thrombus.  Ultrasound hepatic  doppler on 05/02/2015 revealed no convincing evidence of occlusion or thrombus.  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 There was interval reconstitution of previously noted thrombosis within the peripheral branches of the right portal vein. The right and main portal veins appeared widely patent.  A discrete hepatic lesion/mass was not identified.  There was no evidence of cirrhosis. There was unchanged borderline splenomegaly without evidence of gastroesophageal varices or ascites.  There was extensive colonic diverticulosis.  She denies any prior history of thrombosis.  She has not been on birth control pills.  She denies any family history of thrombosis.   Screening studies for malignancy were normal.  Chest x-ray on 11/05/2014 was negative.  Bilateral mammogram on 01/27/2017 demonstrated no mammographic evidence of malignancy.  Colonoscopy on 06/07/2013 revealed only diverticulosis.    Symptomatically, she is doing well.  She denies any bleeding.  Exam is unremarkable.  Plan: 1.   Labs today:  CBC with diff, CMP. 2.   Portal vein thrombosis  Clinically doing well on Xarelto.  Review plan for long term anticoagulation. 3.   Continue lab checks every 3 months. 4.   RTC in 6 months for MD assessment and labs (CBC with diff, CMP, ferritin).  I discussed the assessment and treatment plan with the patient.  The patient was provided an opportunity to ask questions and all were answered.  The patient agreed with the plan and demonstrated an understanding of the instructions.  The patient was advised to call back or seek an in person evaluation if the symptoms worsen or if the condition fails to improve as anticipated.    Lequita Asal, MD  05/05/2018, 4:35 PM

## 2018-05-09 ENCOUNTER — Telehealth: Payer: Self-pay

## 2018-05-09 NOTE — Telephone Encounter (Signed)
-----   Message from Lequita Asal, MD sent at 05/06/2018  8:25 AM EST ----- Regarding: Please call patient  Let her know her ferritin is still low.  She needs to continue oral iron.  M ----- Message ----- From: Buel Ream, Lab [facility] [place] Sent: 05/05/2018   2:07 PM EST To: Lequita Asal, MD

## 2018-05-09 NOTE — Telephone Encounter (Signed)
Left VM informing patient of Ferritin levels and Dr. Kem Parkinson recommendation to continue taking Oral Iron. Phone number provided should any questions arise.

## 2018-05-16 ENCOUNTER — Other Ambulatory Visit: Payer: Self-pay | Admitting: Family Medicine

## 2018-05-16 DIAGNOSIS — I10 Essential (primary) hypertension: Secondary | ICD-10-CM

## 2018-07-12 ENCOUNTER — Encounter: Payer: Self-pay | Admitting: Family Medicine

## 2018-07-12 ENCOUNTER — Ambulatory Visit (INDEPENDENT_AMBULATORY_CARE_PROVIDER_SITE_OTHER): Payer: BC Managed Care – PPO | Admitting: Family Medicine

## 2018-07-12 ENCOUNTER — Other Ambulatory Visit: Payer: Self-pay

## 2018-07-12 VITALS — BP 130/68 | HR 79 | Temp 97.9°F | Resp 16 | Ht 69.0 in | Wt 229.2 lb

## 2018-07-12 DIAGNOSIS — J3089 Other allergic rhinitis: Secondary | ICD-10-CM

## 2018-07-12 DIAGNOSIS — E1129 Type 2 diabetes mellitus with other diabetic kidney complication: Secondary | ICD-10-CM | POA: Diagnosis not present

## 2018-07-12 DIAGNOSIS — E559 Vitamin D deficiency, unspecified: Secondary | ICD-10-CM

## 2018-07-12 DIAGNOSIS — D649 Anemia, unspecified: Secondary | ICD-10-CM

## 2018-07-12 DIAGNOSIS — J302 Other seasonal allergic rhinitis: Secondary | ICD-10-CM

## 2018-07-12 DIAGNOSIS — I10 Essential (primary) hypertension: Secondary | ICD-10-CM | POA: Diagnosis not present

## 2018-07-12 DIAGNOSIS — D6851 Activated protein C resistance: Secondary | ICD-10-CM

## 2018-07-12 DIAGNOSIS — E785 Hyperlipidemia, unspecified: Secondary | ICD-10-CM

## 2018-07-12 DIAGNOSIS — I81 Portal vein thrombosis: Secondary | ICD-10-CM

## 2018-07-12 DIAGNOSIS — R809 Proteinuria, unspecified: Secondary | ICD-10-CM | POA: Diagnosis not present

## 2018-07-12 LAB — POCT GLYCOSYLATED HEMOGLOBIN (HGB A1C): HbA1c, POC (controlled diabetic range): 6.4 % (ref 0.0–7.0)

## 2018-07-12 MED ORDER — ATORVASTATIN CALCIUM 40 MG PO TABS: 40.0000 mg | ORAL_TABLET | Freq: Every day | ORAL | 1 refills | Status: DC

## 2018-07-12 MED ORDER — MONTELUKAST SODIUM 10 MG PO TABS: 10.0000 mg | ORAL_TABLET | Freq: Every day | ORAL | 1 refills | Status: DC

## 2018-07-12 MED ORDER — CHLORZOXAZONE 500 MG PO TABS: 500.0000 mg | ORAL_TABLET | Freq: Three times a day (TID) | ORAL | 0 refills | Status: DC | PRN

## 2018-07-12 MED ORDER — FLUTICASONE PROPIONATE 50 MCG/ACT NA SUSP: 2.0000 | NASAL | 2 refills | Status: DC | PRN

## 2018-07-12 MED ORDER — OLMESARTAN MEDOXOMIL-HCTZ 20-12.5 MG PO TABS: 1.0000 | ORAL_TABLET | Freq: Every day | ORAL | 1 refills | Status: DC

## 2018-07-12 NOTE — Progress Notes (Signed)
Name: Emily Pitts   MRN: [id number]    DOB: [birth year]   Date:07/12/2018       Progress Note  Subjective  Chief Complaint  Chief Complaint  Patient presents with  . Medication Refill  . Diabetes  . Hypertension  . Hyperlipidemia  . Gastroesophageal Reflux  . Allergic Rhinitis   . Constipation    HPI  Diabetes Type II with microalbuminuria: she is on Metformin500 ERtwo pills daily hgbA1C 6.9% toto 6.6%,6.6%, 6.3% 6.6%,6.5% today is 6.4% . She has noticed that glucose is usually high in am's average usually in the 160's range, but latera in am is down to 130-140's . She is still taking 1500 mg of Metformin ER, no side effects, since A1C is normal continue current medications. She deniesblurred vision, polyphagia, polyuria or polydipsia. Chronic nocturia once per night - she drinks a lot of water in the evenings. She is on ARB . She forgot to check her glucose during the night.   Morbid obesity:she is working from home since March because of COVID-19, she has been less active because she is not walking all day. Weight is stable, but gained 4 lbs since last visit   HTN: she is on Benicar hctz  she is taking one pill daily,no chest pain, palpitation or SOB. BP is at goal.No recent episodes of dizziness. Unchanged   Hyperlipidemia: she is now on Atorvastatin and denies side effects.Reviewed labs done 10/2017 and LDL was 55 and HDL 55 Recheck labs next visit   GERD: she stopped taking Omeprazole, controlled with life style modification, no heartburn or regurgitation, very seldom has to take Tums, unchanged   Perennial AR with seasonal variation: Occasional sneezing, rhinorrhea and headaches, worse in the Spring and Fall. Still taking Xyzal but only occasionally, takes singulair daily. Symptoms have been controlled this week.   Portal Vein Thrombosis: seen hematologistDr. Corcooran, taking Xarelto, denies any bleeding.  She has elevated factor V leiden. Read Dr. Kem Parkinson  note and I will check CBC and comp panel as recommended every 3 months and send her the report to decrease frequency of visits with hematologist.She still has anemia, unable to tolerate ferrous sulfate, advised to contact Dr. Mike Gip back and see if she could get iron infusion instead   Constipation: she has history of intermittent constipation and usually responds to Miralax or other otc medications,symptoms were worse around Dec 2018, but back to baseline, [place] 4 with medication.Still taking miralax only prn     Patient Active Problem List   Diagnosis Date Noted  . Iron deficiency anemia 02/14/2018  . 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 Long term current use of anticoagulant therapy 01/05/2016  . Factor 5 Leiden mutation, heterozygous ([place]) 05/02/2015  . Portal vein thrombosis 11/07/2014  . History of sepsis 10/10/2014  . Abnormal electrocardiogram 10/08/2014  . Nonspecific abnormal electromyogram (EMG) 10/08/2014  . Benign essential HTN 10/08/2014  . Diabetes mellitus with renal manifestation ([place]) 10/08/2014  . Dyslipidemia 10/08/2014  . LBP (low back pain) 10/08/2014  . Gastro-esophageal reflux disease without esophagitis 10/08/2014  . Microalbuminuria 10/08/2014  . Disturbance of skin sensation 10/08/2014  . Obesity (BMI 30-39.9) 10/08/2014  . Perennial allergic rhinitis with seasonal variation 10/08/2014  . Plantar fasciitis 10/08/2014  . Postablative ovarian failure 10/08/2014  . Menopausal symptom 10/08/2014  . Vitamin D deficiency 10/08/2014    Past Surgical History:  Procedure Laterality Date  . ABDOMINAL HYSTERECTOMY  2010  . APPENDECTOMY  1966  . COLONOSCOPY  06-07-13  Dr Bary Castilla  . DILATION AND CURETTAGE OF UTERUS    . OOPHORECTOMY      Family History  Problem Relation Age of Onset  . Diabetes Mother   . COPD Mother   . Diabetes Father   . Hypertension Father   . Cancer Father        Kidney and Prostate  . CAD Father   . Diabetes  Brother        [employer]  . Kidney disease Brother        Tumor removed  . Breast cancer Neg Hx     Social History   Socioeconomic History  . Marital status: Single    Spouse name: Not on file  . Number of children: 0  . Years of education: Not on file  . Highest education level: 12th grade  Occupational History  . Not on file  Social Needs  . Financial resource strain: Not hard at all  . Food insecurity:    Worry: Never true    Inability: Never true  . Transportation needs:    Medical: No    Non-medical: No  Tobacco Use  . Smoking status: Never Smoker  . Smokeless tobacco: Never Used  Substance and Sexual Activity  . Alcohol use: No    Alcohol/week: 0.0 standard drinks  . Drug use: No  . Sexual activity: Never  Lifestyle  . Physical activity:    Days per week: 5 days    Minutes per session: 10 min  . Stress: Not at all  Relationships  . Social connections:    Talks on phone: Three times a week    Gets together: More than three times a week    Attends religious service: More than 4 times per year    Active member of club or organization: Yes    Attends meetings of clubs or organizations: More than 4 times per year    Relationship status: Never married  . Intimate partner violence:    Fear of current or ex partner: No    Emotionally abused: No    Physically abused: No    Forced sexual activity: No  Other Topics Concern  . Not on file  Social History Narrative  . Not on file     Current Outpatient Medications:  .  aspirin EC 81 MG tablet, Take 81 mg by mouth daily., Disp: , Rfl:  .  atorvastatin (LIPITOR) 40 MG tablet, TAKE 1 TABLET BY MOUTH ONCE DAILY, Disp: 90 tablet, Rfl: 0 .  chlorzoxazone (PARAFON) 500 MG tablet, TAKE ONE TABLET BY MOUTH THREE TIMES DAILY (Patient taking differently: 500 mg as needed. ), Disp: 90 tablet, Rfl: 0 .  cholecalciferol (VITAMIN D) 1000 units tablet, Take 1,000 Units by mouth daily., Disp: , Rfl:  .  diclofenac sodium  (VOLTAREN) 1 % GEL, Apply 4 g topically 4 (four) times daily. (Patient taking differently: Apply 4 g topically as needed. ), Disp: 100 g, Rfl: 1 .  fluticasone (FLONASE) 50 MCG/ACT nasal spray, Place 2 sprays into both nostrils as needed., Disp: 16 g, Rfl: 11 .  glucose blood (ONE TOUCH ULTRA TEST) test strip, USE AS DIRECTED, Disp: 100 each, Rfl: 12 .  glucose blood test strip, Inject 1 each into the skin daily., Disp: , Rfl:  .  levocetirizine (XYZAL) 5 MG tablet, Take 1 tablet (5 mg total) by mouth daily., Disp: 90 tablet, Rfl: 1 .  MAGNESIUM OXIDE PO, Take 500 mg by mouth daily. , Disp: ,  Rfl:  .  Menthol, Topical Analgesic, 4 % GEL, Apply 2 g topically 4 (four) times daily., Disp: 1 Tube, Rfl: 1 .  metFORMIN (GLUCOPHAGE-XR) 750 MG 24 hr tablet, Take 2 tablets (1,500 mg total) by mouth daily with breakfast., Disp: 180 tablet, Rfl: 1 .  montelukast (SINGULAIR) 10 MG tablet, TAKE 1 TABLET BY MOUTH AT BEDTIME, Disp: 90 tablet, Rfl: 1 .  olmesartan-hydrochlorothiazide (BENICAR HCT) 20-12.5 MG tablet, TAKE 1 TABLET BY MOUTH ONCE DAILY, Disp: 90 tablet, Rfl: 0 .  polyethylene glycol powder (GLYCOLAX/MIRALAX) powder, Take 17 g by mouth 2 (two) times daily. (Patient taking differently: Take 17 g by mouth as needed. ), Disp: 3350 g, Rfl: 1 .  Turmeric 500 MG TABS, Take 1 tablet by mouth daily., Disp: , Rfl:  .  XARELTO 20 MG TABS tablet, TAKE 1 TABLET BY MOUTH ONCE DAILY WITH  SUPPER, Disp: 90 tablet, Rfl: 0  No Known Allergies  I personally reviewed active problem list, medication list, allergies, family history, social history with the patient/caregiver today.   ROS  Ten systems reviewed and is negative except as mentioned in HPI   Objective  Vitals:   07/12/18 1554  BP: 130/68  Pulse: 79  Resp: 16  Temp: 97.9 F (36.6 C)  TempSrc: Oral  SpO2: 99%  Weight: 229 lb 3.2 oz (104 kg)  Height: 5\' 9"  (1.753 m)    Body mass index is 33.85 kg/m.  Physical Exam  Constitutional: Patient  appears well-developed and well-nourished. Obese No distress.  HEENT: head atraumatic, normocephalic, pupils equal and reactive to light,  neck supple, throat within normal limits Cardiovascular: Normal rate, regular rhythm and normal heart sounds.  No murmur heard. No BLE edema. Pulmonary/Chest: Effort normal and breath sounds normal. No respiratory distress. Abdominal: Soft.  There is no tenderness. Psychiatric: Patient has a normal mood and affect. behavior is normal. Judgment and thought content normal.  Recent Results (from the past 2160 hour(s))  Comprehensive metabolic panel     Status: Abnormal   Collection Time: 05/05/18  1:56 PM  Result Value Ref Range   Sodium 137 135 - 145 mmol/L   Potassium 4.5 3.5 - 5.1 mmol/L   Chloride 99 98 - 111 mmol/L   CO2 29 22 - 32 mmol/L   Glucose, Bld 135 (H) 70 - 99 mg/dL   BUN 14 8 - 23 mg/dL   Creatinine, Ser 0.70 0.44 - 1.00 mg/dL   Calcium 9.5 8.9 - 10.3 mg/dL   Total Protein 7.7 6.5 - 8.1 g/dL   Albumin 4.1 3.5 - 5.0 g/dL   AST 22 15 - 41 U/L   ALT 28 0 - 44 U/L   Alkaline Phosphatase 84 38 - 126 U/L   Total Bilirubin 0.5 0.3 - 1.2 mg/dL   GFR calc non Af Amer >60 >60 mL/min   GFR calc Af Amer >60 >60 mL/min   Anion gap 9 5 - 15    Comment: Performed at [facility], [address] [place], [place] [address]  CBC with Differential     Status: Abnormal   Collection Time: 05/05/18  1:56 PM  Result Value Ref Range   WBC 8.9 4.0 - 10.5 K/uL   RBC 4.53 3.87 - 5.11 MIL/uL   Hemoglobin 11.9 (L) 12.0 - 15.0 g/dL   HCT 37.1 36.0 - 46.0 %   MCV 81.9 80.0 - 100.0 fL   MCH 26.3 26.0 - 34.0 pg   MCHC 32.1 30.0 - 36.0  g/dL   RDW 16.0 (H) 11.5 - 15.5 %   Platelets 234 150 - 400 K/uL   nRBC 0.0 0.0 - 0.2 %   Neutrophils Relative % 61 %   Neutro Abs 5.4 1.7 - 7.7 K/uL   Lymphocytes Relative 27 %   Lymphs Abs 2.4 0.7 - 4.0 K/uL   Monocytes Relative 9 %   Monocytes Absolute 0.8 0.1 - 1.0 K/uL   Eosinophils Relative 2 %    Eosinophils Absolute 0.2 0.0 - 0.5 K/uL   Basophils Relative 0 %   Basophils Absolute 0.0 0.0 - 0.1 K/uL   Immature Granulocytes 1 %   Abs Immature Granulocytes 0.05 0.00 - 0.07 K/uL    Comment: Performed at [facility], [address], [place], [place] [address]  Ferritin     Status: Abnormal   Collection Time: 05/05/18  1:56 PM  Result Value Ref Range   Ferritin 6 (L) 11 - 307 ng/mL    Comment: Performed at [facility], [address] [place], [place] [address]  POCT HgB A1C     Status: Normal   Collection Time: 07/12/18  3:59 PM  Result Value Ref Range   Hemoglobin A1C     HbA1c POC (<> result, manual entry)     HbA1c, POC (prediabetic range)     HbA1c, POC (controlled diabetic range) 6.4 0.0 - 7.0 %    Diabetic Foot Exam: Diabetic Foot Exam - Simple   Simple Foot Form Diabetic Foot exam was performed with the following findings:  Yes 07/12/2018  4:13 PM  Visual Inspection See comments:  Yes Sensation Testing Intact to touch and monofilament testing bilaterally:  Yes Pulse Check Posterior Tibialis and Dorsalis pulse intact bilaterally:  Yes Comments     PHQ2/9: Depression screen [facility] 2/9 07/12/2018 11/04/2017 03/29/2017 10/30/2016 04/30/2016  Decreased Interest 0 0 0 0 0  Down, Depressed, Hopeless 0 0 0 0 0  PHQ - 2 Score 0 0 0 0 0  Altered sleeping 1 - - - -  Tired, decreased energy 1 - - - -  Change in appetite 0 - - - -  Feeling bad or failure about yourself  0 - - - -  Trouble concentrating 0 - - - -  Moving slowly or fidgety/restless 0 - - - -  Suicidal thoughts 0 - - - -  PHQ-9 Score 2 - - - -  Difficult doing work/chores Not difficult at all - - - -    phq 9 is negative   Fall Risk: Fall Risk  07/12/2018 11/04/2017 09/10/2017 06/23/2017 03/29/2017  Falls in the past year? 0 No No No No  Number falls in past yr: 0 - - - -  Injury with Fall? 0 - - - -     Functional Status Survey: Is the patient deaf or have difficulty hearing?: No Does  the patient have difficulty seeing, even when wearing glasses/contacts?: No Does the patient have difficulty concentrating, remembering, or making decisions?: No Does the patient have difficulty walking or climbing stairs?: No Does the patient have difficulty dressing or bathing?: No Does the patient have difficulty doing errands alone such as visiting a doctor's office or shopping?: No   Assessment & Plan    1. Type 2 diabetes mellitus with microalbuminuria, without long-term current use of insulin (HCC)  - POCT HgB A1C - Urine Microalbumin w/creat. ratio  2. Dyslipidemia  - atorvastatin (LIPITOR) 40 MG tablet; Take 1 tablet (40 mg  total) by mouth daily.  Dispense: 90 tablet; Refill: 1  3. Perennial allergic rhinitis with seasonal variation  - montelukast (SINGULAIR) 10 MG tablet; Take 1 tablet (10 mg total) by mouth at bedtime.  Dispense: 90 tablet; Refill: 1  4. Benign essential HTN  - olmesartan-hydrochlorothiazide (BENICAR HCT) 20-12.5 MG tablet; Take 1 tablet by mouth daily.  Dispense: 90 tablet; Refill: 1  5. Vitamin D deficiency  Continue supplementation   6. Anemia, unspecified type  Keep follow up with hematologist   7. Portal vein thrombosis  Continue Xarelto   8. Factor 5 Leiden mutation, heterozygous ([place])

## 2018-07-13 LAB — MICROALBUMIN / CREATININE URINE RATIO
Creatinine, Urine: 35 mg/dL (ref 20–275)
Microalb Creat Ratio: 6 mcg/mg creat (ref <30)
Microalb, Ur: 0.2 mg/dL

## 2018-07-21 ENCOUNTER — Other Ambulatory Visit: Payer: Self-pay | Admitting: *Deleted

## 2018-07-21 DIAGNOSIS — I81 Portal vein thrombosis: Secondary | ICD-10-CM

## 2018-07-21 DIAGNOSIS — D6851 Activated protein C resistance: Secondary | ICD-10-CM

## 2018-07-21 MED ORDER — RIVAROXABAN 20 MG PO TABS: ORAL_TABLET | ORAL | 0 refills | Status: DC

## 2018-07-22 LAB — HM DIABETES EYE EXAM

## 2018-07-26 ENCOUNTER — Encounter: Payer: Self-pay | Admitting: Family Medicine

## 2018-08-21 ENCOUNTER — Encounter: Payer: Self-pay | Admitting: Hematology and Oncology

## 2018-09-01 ENCOUNTER — Other Ambulatory Visit: Payer: Self-pay

## 2018-09-01 DIAGNOSIS — E1129 Type 2 diabetes mellitus with other diabetic kidney complication: Secondary | ICD-10-CM

## 2018-09-01 DIAGNOSIS — R809 Proteinuria, unspecified: Secondary | ICD-10-CM

## 2018-09-01 NOTE — Telephone Encounter (Signed)
Patient is calling to check status of when pcp will call back Call back # [phone]

## 2018-09-01 NOTE — Telephone Encounter (Signed)
Copied from [place] [phone]. Topic: Quick Communication - Rx Refill/Question >> Sep 01, 2018 10:34 AM Erick Blinks wrote: Medication: Metformin Recall - requesting call back for advice  Best Contact: [phone]

## 2018-09-02 MED ORDER — METFORMIN HCL ER 750 MG PO TB24: 1500.0000 mg | ORAL_TABLET | Freq: Every day | ORAL | 1 refills | Status: DC

## 2018-09-02 NOTE — Telephone Encounter (Signed)
Patient notified prescription sent in and [facility] was filling it at no charge per pharmacy.

## 2018-09-02 NOTE — Telephone Encounter (Signed)
[facility] states if you send a new 90 day prescription they are filling the Metformin as a no charge to the patient that lot and batch numbers were affected.

## 2018-10-17 ENCOUNTER — Other Ambulatory Visit: Payer: Self-pay | Admitting: Hematology and Oncology

## 2018-10-17 DIAGNOSIS — D6851 Activated protein C resistance: Secondary | ICD-10-CM

## 2018-10-17 DIAGNOSIS — I81 Portal vein thrombosis: Secondary | ICD-10-CM

## 2018-10-18 ENCOUNTER — Other Ambulatory Visit: Payer: Self-pay | Admitting: Family Medicine

## 2018-10-18 DIAGNOSIS — J3089 Other allergic rhinitis: Secondary | ICD-10-CM

## 2018-10-18 DIAGNOSIS — J302 Other seasonal allergic rhinitis: Secondary | ICD-10-CM

## 2018-11-03 ENCOUNTER — Other Ambulatory Visit: Payer: BC Managed Care – PPO

## 2018-11-03 ENCOUNTER — Ambulatory Visit: Payer: BC Managed Care – PPO | Admitting: Hematology and Oncology

## 2018-11-06 NOTE — Progress Notes (Signed)
[facility]  [address] [place], [place] [address] Phone: [phone]  Fax: [phone]   Clinic Day:  11/07/2018  Referring physician: Steele Sizer, MD  Chief Complaint: Emily Pitts is a 63 y.o. female with Factor V Leiden and portal vein thrombosis who is seen for 6 month assessment on Xarelto.  HPI: The patient was last seen in the hematology clinic on 05/05/2018.  At that time, she was doing well. She denied any bleeding. Exam was unremarkable. Hematocrit 37.1 and hemoglobin 11.9.  Ferritin was 6. She continued on Xarelto and oral iron.   During the interim, she has felt "good".  She has not been on oral iron secondary to nausea.  She is trying to eat green leafy vegetables.  She eats meat every other day.  She denies pica.  She always has restless legs.  She denies any bleeding.   Past Medical History:  Diagnosis Date  . Allergy   . Diabetes mellitus without complication ([place]) 2426  . Hyperlipidemia   . Hypertension   . Low back pain, episodic   . Numbness of feet   . Vitamin D deficiency     Past Surgical History:  Procedure Laterality Date  . ABDOMINAL HYSTERECTOMY  2010  . APPENDECTOMY  1966  . COLONOSCOPY  06-07-13   Dr Bary Castilla  . DILATION AND CURETTAGE OF UTERUS    . OOPHORECTOMY      Family History  Problem Relation Age of Onset  . Diabetes Mother   . COPD Mother   . Diabetes Father   . Hypertension Father   . Cancer Father        Kidney and Prostate  . CAD Father   . Diabetes Brother        [employer]  . Kidney disease Brother        Tumor removed  . Breast cancer Neg Hx     Social History:  reports that she has never smoked. She has never used smokeless tobacco. She reports that she does not drink alcohol or use drugs. She does not have any children.  She lives in [place].  The patient is alone today.  Allergies: No Known Allergies  Current Medications: Current Outpatient Medications  Medication  Sig Dispense Refill  . aspirin EC 81 MG tablet Take 81 mg by mouth daily.    Marland Kitchen atorvastatin (LIPITOR) 40 MG tablet Take 1 tablet (40 mg total) by mouth daily. 90 tablet 1  . chlorzoxazone (PARAFON) 500 MG tablet Take 1 tablet (500 mg total) by mouth 3 (three) times daily as needed. 30 tablet 0  . cholecalciferol (VITAMIN D) 1000 units tablet Take 500 Units by mouth daily.     . diclofenac sodium (VOLTAREN) 1 % GEL Apply 4 g topically 4 (four) times daily. (Patient taking differently: Apply 4 g topically as needed. ) 100 g 1  . fluticasone (FLONASE) 50 MCG/ACT nasal spray Place 2 sprays into both nostrils as needed. 16 g 2  . glucose blood (ONE TOUCH ULTRA TEST) test strip USE AS DIRECTED 100 each 12  . levocetirizine (XYZAL) 5 MG tablet Take 1 tablet by mouth once daily 90 tablet 1  . MAGNESIUM OXIDE PO Take 500 mg by mouth daily.     . Menthol, Topical Analgesic, 4 % GEL Apply 2 g topically 4 (four) times daily. 1 Tube 1  . metFORMIN (GLUCOPHAGE-XR) 750 MG 24 hr tablet Take 2 tablets (1,500 mg total) by mouth daily  with breakfast. 180 tablet 1  . montelukast (SINGULAIR) 10 MG tablet Take 1 tablet (10 mg total) by mouth at bedtime. (Patient taking differently: Take 10 mg by mouth as needed. ) 90 tablet 1  . olmesartan-hydrochlorothiazide (BENICAR HCT) 20-12.5 MG tablet Take 1 tablet by mouth daily. 90 tablet 1  . polyethylene glycol powder (GLYCOLAX/MIRALAX) powder Take 17 g by mouth 2 (two) times daily. (Patient taking differently: Take 17 g by mouth as needed. ) 3350 g 1  . Turmeric 500 MG TABS Take 1 tablet by mouth daily.    Alveda Reasons 20 MG TABS tablet TAKE 1 TABLET BY MOUTH ONCE DAILY WITH SUPPER 90 tablet 0   No current facility-administered medications for this visit.     Review of Systems  Constitutional: Positive for weight loss (2 lbs). Negative for chills, diaphoresis, fever and malaise/fatigue.       Feels "good".   HENT: Negative.  Negative for congestion, ear pain, hearing loss,  nosebleeds, sinus pain and sore throat.   Eyes: Negative.  Negative for blurred vision, double vision and photophobia.  Respiratory: Negative.  Negative for cough, sputum production, shortness of breath and wheezing.   Cardiovascular: Negative.  Negative for chest pain, palpitations, orthopnea, leg swelling and PND.  Gastrointestinal: Negative.  Negative for abdominal pain, blood in stool, constipation, diarrhea, melena, nausea and vomiting.       Colonoscopy 06/2013.  Genitourinary: Negative.  Negative for dysuria, frequency, hematuria and urgency.  Musculoskeletal: Positive for joint pain (right knee, arthritis). Negative for back pain and myalgias.       Muscle spasms at times.  Skin: Negative.  Negative for rash.  Neurological: Negative.  Negative for dizziness, tingling, sensory change, focal weakness, weakness and headaches.  Endo/Heme/Allergies: Negative.  Does not bruise/bleed easily.       Diabetes.  Psychiatric/Behavioral: Negative.  Negative for depression and memory loss. The patient is not nervous/anxious and does not have insomnia.   All other systems reviewed and are negative.  Performance status (ECOG): 0  Vitals Blood pressure 125/67, pulse 81, temperature 98 F (36.7 C), temperature source Tympanic, resp. rate 18, height 5' 9"  (1.753 m), weight 227 lb 1.2 oz (103 kg), SpO2 98 %.   Physical Exam  Constitutional: She is oriented to person, place, and time. She appears well-developed and well-nourished. No distress.  HENT:  Head: Normocephalic and atraumatic.  Mouth/Throat: Oropharynx is clear and moist. No oropharyngeal exudate.  Short red hair.   Eyes: Pupils are equal, round, and reactive to light. Conjunctivae and EOM are normal. No scleral icterus.  Brown eyes.   Neck: Normal range of motion. Neck supple. No JVD present.  Cardiovascular: Normal rate, regular rhythm and normal heart sounds.  No murmur heard. Pulmonary/Chest: Effort normal and breath sounds normal.  No respiratory distress. She has no wheezes. She has no rales.  Abdominal: Soft. Bowel sounds are normal. She exhibits no distension and no mass. There is no abdominal tenderness. There is no rebound and no guarding.  Musculoskeletal: Normal range of motion.        General: No edema.  Lymphadenopathy:    She has no cervical adenopathy.    She has no axillary adenopathy.       Right: No supraclavicular adenopathy present.       Left: No supraclavicular adenopathy present.  Neurological: She is oriented to person, place, and time.  Skin: Skin is warm and dry. No rash noted. She is not diaphoretic.  Psychiatric: She has  a normal mood and affect. Her behavior is normal. Judgment and thought content normal.  Nursing note and vitals reviewed.   Appointment on 11/07/2018  Component Date Value Ref Range Status  . Sodium 11/07/2018 134* 135 - 145 mmol/L Final  . Potassium 11/07/2018 4.4  3.5 - 5.1 mmol/L Final  . Chloride 11/07/2018 99  98 - 111 mmol/L Final  . CO2 11/07/2018 26  22 - 32 mmol/L Final  . Glucose, Bld 11/07/2018 123* 70 - 99 mg/dL Final  . BUN 11/07/2018 13  8 - 23 mg/dL Final  . Creatinine, Ser 11/07/2018 0.65  0.44 - 1.00 mg/dL Final  . Calcium 11/07/2018 9.5  8.9 - 10.3 mg/dL Final  . Total Protein 11/07/2018 7.5  6.5 - 8.1 g/dL Final  . Albumin 11/07/2018 3.8  3.5 - 5.0 g/dL Final  . AST 11/07/2018 18  15 - 41 U/L Final  . ALT 11/07/2018 21  0 - 44 U/L Final  . Alkaline Phosphatase 11/07/2018 85  38 - 126 U/L Final  . Total Bilirubin 11/07/2018 0.4  0.3 - 1.2 mg/dL Final  . GFR calc non Af Amer 11/07/2018 >60  >60 mL/min Final  . GFR calc Af Amer 11/07/2018 >60  >60 mL/min Final  . Anion gap 11/07/2018 9  5 - 15 Final   Performed at [facility], [address], [place], [place] [address]  . WBC 11/07/2018 9.0  4.0 - 10.5 K/uL Final  . RBC 11/07/2018 4.39  3.87 - 5.11 MIL/uL Final  . Hemoglobin 11/07/2018 11.5* 12.0 - 15.0 g/dL Final  . HCT 11/07/2018  35.7* 36.0 - 46.0 % Final  . MCV 11/07/2018 81.3  80.0 - 100.0 fL Final  . MCH 11/07/2018 26.2  26.0 - 34.0 pg Final  . MCHC 11/07/2018 32.2  30.0 - 36.0 g/dL Final  . RDW 11/07/2018 14.8  11.5 - 15.5 % Final  . Platelets 11/07/2018 222  150 - 400 K/uL Final  . nRBC 11/07/2018 0.0  0.0 - 0.2 % Final  . Neutrophils Relative % 11/07/2018 62  % Final  . Neutro Abs 11/07/2018 5.4  1.7 - 7.7 K/uL Final  . Lymphocytes Relative 11/07/2018 27  % Final  . Lymphs Abs 11/07/2018 2.5  0.7 - 4.0 K/uL Final  . Monocytes Relative 11/07/2018 10  % Final  . Monocytes Absolute 11/07/2018 0.9  0.1 - 1.0 K/uL Final  . Eosinophils Relative 11/07/2018 1  % Final  . Eosinophils Absolute 11/07/2018 0.1  0.0 - 0.5 K/uL Final  . Basophils Relative 11/07/2018 0  % Final  . Basophils Absolute 11/07/2018 0.0  0.0 - 0.1 K/uL Final  . Immature Granulocytes 11/07/2018 0  % Final  . Abs Immature Granulocytes 11/07/2018 0.03  0.00 - 0.07 K/uL Final   Performed at [facility], [address] [place], [place] [address]    Assessment:  Emily Pitts is a 63 y.o. female with portal vein thrombosis presenting with fever, chills, dehydration, weight loss, and abdominal pain.  Symptoms lasted for about 4 weeks.  Abdominal and pelvic CT on 11/07/2014 revealed a partially thrombosed portal veins (left > right).  The main portal vein and mesenteric veins remained patent.  Porta hepatis nodes measured 1.2 cm in short axis (stable).  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  The following studies were normal:  prothrombin gene mutation, lupus anticoagulant panel, anticardiolipin antibodies, homocysteine, protein C antigen (88%), protein C activity (111%),  protein S antigen total (131%), protein S antigen free (108%), anti-thrombin III antigen (88%), anti-thrombin III functional (101%).  On 12/11/2014, protein S antigen total (95%), protein S antigen free (77%), and  protein S activity (69%) were normal.  PNH by flow cytometry was negative.  JAK2 V617F and exon 12 were negative on 05/02/2015.  Additional testing on 11/27/2014 revealed resolution of anemia.  The following studies were normal:  SPEP, free light chain ratio, 24 hour UPEP, IgG, IgA, ANA, ESR, ferritin, reticulocyte count, B12, and folate.  Serum protein electrophoresis revealed no monoclonal protein.  Kappa free light chains were 26.67, lambda free light chains 24.67 and ratio 1.08 (normal).   IgM was 247 (26-217).    She was started on Xarelto on 11/08/2014.  Plan is for long term anticoagulation given the unprovoked thrombus.  Ultrasound hepatic doppler on 05/02/2015 revealed no convincing evidence of occlusion or thrombus.  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 There was interval reconstitution of previously noted thrombosis within the peripheral branches of the right portal vein. The right and main portal veins appeared widely patent.  A discrete hepatic lesion/mass was not identified.  There was no evidence of cirrhosis. There was unchanged borderline splenomegaly without evidence of gastroesophageal varices or ascites.  There was extensive colonic diverticulosis.  She denies any prior history of thrombosis.  She has not been on birth control pills.  She denies any family history of thrombosis.   Screening studies for malignancy were normal.  Chest x-ray on 11/05/2014 was negative.  Bilateral mammogram on 01/27/2017 demonstrated no mammographic evidence of malignancy.  Colonoscopy on 06/07/2013 revealed only diverticulosis.  She has iron deficiency.  Ferritin was 39 on 11/27/2014, 7 on 02/08/2018, 6 on 05/05/2018, and 7 on 11/07/2018.  Patient does not tolerate oral iron.  Symptomatically, she is doing well.  Exam is stable.  Hemoglobin 11.5.  Plan: 1.  Labs today:  CBC with diff, CMP, ferritin. 2.   Portal vein thrombosis              Clinically, she continues to do well on Xarelto.   Continue long-term anticoagulation. 3.   Iron deficiency  Hematocrit 35.7.  Hemoglobin 11.5.    Ferritin 7 (available after appointment).  Ferritin goal 100.  Patient denies any bleeding.  She eats meat every other day and tries to eat green leafy vegetables.  She is intolerant of oral iron. 4.   RTC in 6 months for labs (CBC with diff, CMP, ferritin). 5.   RTC in 12 months for MD assessment and labs (CBC with diff, CMP, ferritin).  Addendum:  The patient was contacted after her appointment regarding her ferritin.  She will be scheduled for Venofer weekly x 3.  I discussed the assessment and treatment plan with the patient.  The patient was provided an opportunity to ask questions and all were answered.  The patient agreed with the plan and demonstrated an understanding of the instructions.  The patient was advised to call back if the symptoms worsen or if the condition fails to improve as anticipated.   Lequita Asal, MD, PhD    11/07/2018, 4:21 PM

## 2018-11-06 NOTE — Progress Notes (Signed)
Confirmed Name, DOB, and Address. COVID Screening complete. Reminded of mask and no visitor policy. Denies any concerns at this time.

## 2018-11-07 ENCOUNTER — Encounter: Payer: Self-pay | Admitting: Hematology and Oncology

## 2018-11-07 ENCOUNTER — Inpatient Hospital Stay: Payer: BC Managed Care – PPO | Attending: Hematology and Oncology

## 2018-11-07 ENCOUNTER — Inpatient Hospital Stay: Payer: BC Managed Care – PPO | Admitting: Hematology and Oncology

## 2018-11-07 ENCOUNTER — Other Ambulatory Visit: Payer: Self-pay

## 2018-11-07 VITALS — BP 125/67 | HR 81 | Temp 98.0°F | Resp 18 | Ht 69.0 in | Wt 227.1 lb

## 2018-11-07 DIAGNOSIS — Z7982 Long term (current) use of aspirin: Secondary | ICD-10-CM | POA: Insufficient documentation

## 2018-11-07 DIAGNOSIS — E119 Type 2 diabetes mellitus without complications: Secondary | ICD-10-CM | POA: Insufficient documentation

## 2018-11-07 DIAGNOSIS — I81 Portal vein thrombosis: Secondary | ICD-10-CM | POA: Insufficient documentation

## 2018-11-07 DIAGNOSIS — D509 Iron deficiency anemia, unspecified: Secondary | ICD-10-CM

## 2018-11-07 DIAGNOSIS — D6851 Activated protein C resistance: Secondary | ICD-10-CM | POA: Diagnosis not present

## 2018-11-07 DIAGNOSIS — Z7984 Long term (current) use of oral hypoglycemic drugs: Secondary | ICD-10-CM | POA: Insufficient documentation

## 2018-11-07 DIAGNOSIS — E611 Iron deficiency: Secondary | ICD-10-CM | POA: Diagnosis not present

## 2018-11-07 DIAGNOSIS — Z791 Long term (current) use of non-steroidal anti-inflammatories (NSAID): Secondary | ICD-10-CM | POA: Diagnosis not present

## 2018-11-07 DIAGNOSIS — E785 Hyperlipidemia, unspecified: Secondary | ICD-10-CM | POA: Insufficient documentation

## 2018-11-07 DIAGNOSIS — Z7901 Long term (current) use of anticoagulants: Secondary | ICD-10-CM | POA: Insufficient documentation

## 2018-11-07 DIAGNOSIS — Z79899 Other long term (current) drug therapy: Secondary | ICD-10-CM | POA: Diagnosis not present

## 2018-11-07 DIAGNOSIS — Z803 Family history of malignant neoplasm of breast: Secondary | ICD-10-CM | POA: Diagnosis not present

## 2018-11-07 DIAGNOSIS — I10 Essential (primary) hypertension: Secondary | ICD-10-CM | POA: Insufficient documentation

## 2018-11-07 LAB — CBC WITH DIFFERENTIAL/PLATELET
Abs Immature Granulocytes: 0.03 10*3/uL (ref 0.00–0.07)
Basophils Absolute: 0 10*3/uL (ref 0.0–0.1)
Basophils Relative: 0 %
Eosinophils Absolute: 0.1 10*3/uL (ref 0.0–0.5)
Eosinophils Relative: 1 %
HCT: 35.7 % — ABNORMAL LOW (ref 36.0–46.0)
Hemoglobin: 11.5 g/dL — ABNORMAL LOW (ref 12.0–15.0)
Immature Granulocytes: 0 %
Lymphocytes Relative: 27 %
Lymphs Abs: 2.5 10*3/uL (ref 0.7–4.0)
MCH: 26.2 pg (ref 26.0–34.0)
MCHC: 32.2 g/dL (ref 30.0–36.0)
MCV: 81.3 fL (ref 80.0–100.0)
Monocytes Absolute: 0.9 10*3/uL (ref 0.1–1.0)
Monocytes Relative: 10 %
Neutro Abs: 5.4 10*3/uL (ref 1.7–7.7)
Neutrophils Relative %: 62 %
Platelets: 222 10*3/uL (ref 150–400)
RBC: 4.39 MIL/uL (ref 3.87–5.11)
RDW: 14.8 % (ref 11.5–15.5)
WBC: 9 10*3/uL (ref 4.0–10.5)
nRBC: 0 % (ref 0.0–0.2)

## 2018-11-07 LAB — COMPREHENSIVE METABOLIC PANEL
ALT: 21 U/L (ref 0–44)
AST: 18 U/L (ref 15–41)
Albumin: 3.8 g/dL (ref 3.5–5.0)
Alkaline Phosphatase: 85 U/L (ref 38–126)
Anion gap: 9 (ref 5–15)
BUN: 13 mg/dL (ref 8–23)
CO2: 26 mmol/L (ref 22–32)
Calcium: 9.5 mg/dL (ref 8.9–10.3)
Chloride: 99 mmol/L (ref 98–111)
Creatinine, Ser: 0.65 mg/dL (ref 0.44–1.00)
GFR calc Af Amer: >60 mL/min (ref >60)
GFR calc non Af Amer: >60 mL/min (ref >60)
Glucose, Bld: 123 mg/dL — ABNORMAL HIGH (ref 70–99)
Potassium: 4.4 mmol/L (ref 3.5–5.1)
Sodium: 134 mmol/L — ABNORMAL LOW (ref 135–145)
Total Bilirubin: 0.4 mg/dL (ref 0.3–1.2)
Total Protein: 7.5 g/dL (ref 6.5–8.1)

## 2018-11-07 LAB — FERRITIN: Ferritin: 7 ng/mL — ABNORMAL LOW (ref 11–307)

## 2018-11-07 NOTE — Progress Notes (Signed)
No new changes noted today 

## 2018-11-08 ENCOUNTER — Telehealth: Payer: Self-pay

## 2018-11-08 NOTE — Telephone Encounter (Signed)
-----   Message from Lequita Asal, MD sent at 11/08/2018 12:39 PM EDT ----- Regarding: Please call patient  Ferritin is low.  She is intolerant of oral iron.  Would she like to come in for Venofer weekly x 3?  M ----- Message ----- From: Buel Ream, Lab [facility] [place] Sent: 11/07/2018   4:01 PM EDT To: Lequita Asal, MD

## 2018-11-08 NOTE — Telephone Encounter (Signed)
Spoke with Emily Pitts to inform her that her Ferritin levels are low at 7 and per Dr Mike Gip she would benifit from IV Iron. I explained to the patient she would need to come in weekly x 3 for the Venofer treatment. The patient was understanding and agreeable.

## 2018-11-11 ENCOUNTER — Other Ambulatory Visit: Payer: Self-pay

## 2018-11-15 ENCOUNTER — Other Ambulatory Visit: Payer: Self-pay

## 2018-11-15 ENCOUNTER — Inpatient Hospital Stay: Payer: BC Managed Care – PPO | Attending: Hematology and Oncology

## 2018-11-15 VITALS — BP 133/76 | HR 74 | Temp 98.0°F | Resp 18

## 2018-11-15 DIAGNOSIS — D509 Iron deficiency anemia, unspecified: Secondary | ICD-10-CM | POA: Diagnosis present

## 2018-11-15 MED ORDER — SODIUM CHLORIDE 0.9 % IV SOLN: 200.0000 mg | Freq: Once | INTRAVENOUS | Status: DC

## 2018-11-15 MED ORDER — SODIUM CHLORIDE 0.9 % IV SOLN
Freq: Once | INTRAVENOUS | Status: AC
  Administered 2018-11-15: 14:00:00 via INTRAVENOUS
  Filled 2018-11-15: qty 250

## 2018-11-15 MED ORDER — IRON SUCROSE 20 MG/ML IV SOLN
200.0000 mg | Freq: Once | INTRAVENOUS | Status: AC
  Administered 2018-11-15: 14:00:00 200 mg via INTRAVENOUS

## 2018-11-15 NOTE — Patient Instructions (Signed)

## 2018-11-15 NOTE — Progress Notes (Signed)
Patient oriented to Infusion Suite.  Patient given teaching sheet on Iron Sucrose.  Questions answered.

## 2018-11-21 ENCOUNTER — Other Ambulatory Visit: Payer: Self-pay

## 2018-11-22 ENCOUNTER — Inpatient Hospital Stay: Payer: BC Managed Care – PPO

## 2018-11-22 VITALS — BP 119/68 | HR 76 | Temp 98.3°F | Resp 18

## 2018-11-22 DIAGNOSIS — D509 Iron deficiency anemia, unspecified: Secondary | ICD-10-CM

## 2018-11-22 MED ORDER — SODIUM CHLORIDE 0.9 % IV SOLN
Freq: Once | INTRAVENOUS | Status: AC
  Administered 2018-11-22: 15:00:00 via INTRAVENOUS
  Filled 2018-11-22: qty 250

## 2018-11-22 MED ORDER — SODIUM CHLORIDE 0.9 % IV SOLN: 200.0000 mg | Freq: Once | INTRAVENOUS | Status: DC

## 2018-11-22 MED ORDER — IRON SUCROSE 20 MG/ML IV SOLN
200.0000 mg | Freq: Once | INTRAVENOUS | Status: AC
  Administered 2018-11-22: 200 mg via INTRAVENOUS
  Filled 2018-11-22: qty 10

## 2018-11-22 NOTE — Patient Instructions (Signed)

## 2018-11-28 ENCOUNTER — Other Ambulatory Visit: Payer: Self-pay

## 2018-11-29 ENCOUNTER — Inpatient Hospital Stay: Payer: BC Managed Care – PPO

## 2018-11-29 VITALS — BP 120/73 | HR 78 | Temp 98.0°F | Resp 18

## 2018-11-29 DIAGNOSIS — D509 Iron deficiency anemia, unspecified: Secondary | ICD-10-CM | POA: Diagnosis not present

## 2018-11-29 MED ORDER — SODIUM CHLORIDE 0.9 % IV SOLN: 200.0000 mg | Freq: Once | INTRAVENOUS | Status: DC

## 2018-11-29 MED ORDER — SODIUM CHLORIDE 0.9 % IV SOLN
Freq: Once | INTRAVENOUS | Status: AC
  Administered 2018-11-29: 15:00:00 via INTRAVENOUS
  Filled 2018-11-29: qty 250

## 2018-11-29 MED ORDER — IRON SUCROSE 20 MG/ML IV SOLN
200.0000 mg | Freq: Once | INTRAVENOUS | Status: AC
  Administered 2018-11-29: 15:00:00 200 mg via INTRAVENOUS

## 2018-11-29 NOTE — Patient Instructions (Signed)

## 2019-01-02 ENCOUNTER — Ambulatory Visit: Payer: BC Managed Care – PPO | Admitting: Family Medicine

## 2019-01-02 ENCOUNTER — Encounter: Payer: Self-pay | Admitting: Family Medicine

## 2019-01-02 ENCOUNTER — Other Ambulatory Visit: Payer: Self-pay

## 2019-01-02 VITALS — BP 128/82 | HR 87 | Temp 97.7°F | Resp 16 | Ht 69.0 in | Wt 225.6 lb

## 2019-01-02 DIAGNOSIS — D508 Other iron deficiency anemias: Secondary | ICD-10-CM

## 2019-01-02 DIAGNOSIS — E785 Hyperlipidemia, unspecified: Secondary | ICD-10-CM | POA: Diagnosis not present

## 2019-01-02 DIAGNOSIS — R809 Proteinuria, unspecified: Secondary | ICD-10-CM | POA: Diagnosis not present

## 2019-01-02 DIAGNOSIS — J302 Other seasonal allergic rhinitis: Secondary | ICD-10-CM

## 2019-01-02 DIAGNOSIS — D6851 Activated protein C resistance: Secondary | ICD-10-CM

## 2019-01-02 DIAGNOSIS — I81 Portal vein thrombosis: Secondary | ICD-10-CM

## 2019-01-02 DIAGNOSIS — E1129 Type 2 diabetes mellitus with other diabetic kidney complication: Secondary | ICD-10-CM

## 2019-01-02 DIAGNOSIS — E559 Vitamin D deficiency, unspecified: Secondary | ICD-10-CM

## 2019-01-02 DIAGNOSIS — I10 Essential (primary) hypertension: Secondary | ICD-10-CM | POA: Diagnosis not present

## 2019-01-02 DIAGNOSIS — M1711 Unilateral primary osteoarthritis, right knee: Secondary | ICD-10-CM

## 2019-01-02 DIAGNOSIS — Z23 Encounter for immunization: Secondary | ICD-10-CM | POA: Diagnosis not present

## 2019-01-02 DIAGNOSIS — J3089 Other allergic rhinitis: Secondary | ICD-10-CM

## 2019-01-02 DIAGNOSIS — K219 Gastro-esophageal reflux disease without esophagitis: Secondary | ICD-10-CM

## 2019-01-02 LAB — POCT GLYCOSYLATED HEMOGLOBIN (HGB A1C): Hemoglobin A1C: 6.1 % — AB (ref 4.0–5.6)

## 2019-01-02 MED ORDER — OLMESARTAN MEDOXOMIL-HCTZ 20-12.5 MG PO TABS: 1.0000 | ORAL_TABLET | Freq: Every day | ORAL | 1 refills | Status: DC

## 2019-01-02 MED ORDER — METFORMIN HCL ER 750 MG PO TB24: 1500.0000 mg | ORAL_TABLET | Freq: Every day | ORAL | 1 refills | Status: DC

## 2019-01-02 MED ORDER — ATORVASTATIN CALCIUM 40 MG PO TABS: 40.0000 mg | ORAL_TABLET | Freq: Every day | ORAL | 1 refills | Status: DC

## 2019-01-02 NOTE — Progress Notes (Signed)
Name: Emily Pitts   MRN: [id number]    DOB: 07/22/1955   Date:01/02/2019       Progress Note  Subjective  Chief Complaint  Chief Complaint  Patient presents with  . Hypertension  . Gastroesophageal Reflux  . Diabetes  . Dyslipidemia  . Anemia    HPI  Diabetes Type II with microalbuminuria: she is on Metformin500 ERtwo pills daily hgbA1C 6.9% toto 6.6%,6.6%, 6.3% 6.6%,6.5%, 6.4% and today is down to 6.1% . She has not been checking her glucose lately.  She is still taking 1500 mg of Metformin ER, no side effects. She deniesblurred vision, polyphagia, polyuria or polydipsia. Chronic nocturia once per night - she drinks a lot of water in the evenings. She is on ARB . She states since working from home so she is not eating out as often. She also states she has a new dog and has been walking more often.    Obesity:she is eating at home and has been walking more since she got a new dog May 2020. She lost 4 lbs since last visit   HTN: she is on Benicar hctz  she is taking one pill daily,no chest pain, palpitation, SOB or dizziness    Hyperlipidemia: she is now on Atorvastatin and denies side effects .Reviewed labs done 10/2017 and LDL was 55 and HDL 55 Recheck labs today   GERD: she stopped taking Omeprazole, controlled with life style modification, no heartburn or regurgitation, very seldom has to take Tums, she is still doing well   Perennial AR with seasonal variation: Occasional sneezing, rhinorrhea and headaches, worse in the Spring and Fall. Still taking Xyzalbut only occasionally, takes singulair prn.She states symptoms not as severe this time around  Portal Vein Thrombosis: seen hematologistDr. Corcooran, taking Xarelto, denies any bleeding. She has elevated factor V leiden. She recently had iron infusion and we will recheck CBC today and iron studies   Constipation: she has history of intermittent constipation and usually responds to Miralax or other otc  medications,symptoms were worse around Dec 2018, but back to baseline, [place] 4 with medication.Still taking miralax only prn. Unchanged  Patient Active Problem List   Diagnosis Date Noted  . Iron deficiency anemia 02/14/2018  . 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 Long term current use of anticoagulant therapy 01/05/2016  . Factor 5 Leiden mutation, heterozygous ([place]) 05/02/2015  . Portal vein thrombosis 11/07/2014  . History of sepsis 10/10/2014  . Abnormal electrocardiogram 10/08/2014  . Nonspecific abnormal electromyogram (EMG) 10/08/2014  . Benign essential HTN 10/08/2014  . Diabetes mellitus with renal manifestation ([place]) 10/08/2014  . Dyslipidemia 10/08/2014  . LBP (low back pain) 10/08/2014  . Gastro-esophageal reflux disease without esophagitis 10/08/2014  . Microalbuminuria 10/08/2014  . Disturbance of skin sensation 10/08/2014  . Obesity (BMI 30-39.9) 10/08/2014  . Perennial allergic rhinitis with seasonal variation 10/08/2014  . Plantar fasciitis 10/08/2014  . Postablative ovarian failure 10/08/2014  . Menopausal symptom 10/08/2014  . Vitamin D deficiency 10/08/2014    Past Surgical History:  Procedure Laterality Date  . ABDOMINAL HYSTERECTOMY  2010  . APPENDECTOMY  1966  . COLONOSCOPY  06-07-13   Dr Bary Castilla  . DILATION AND CURETTAGE OF UTERUS    . OOPHORECTOMY      Family History  Problem Relation Age of Onset  . Diabetes Mother   . COPD Mother   . Diabetes Father   . Hypertension Father   . Cancer Father        Kidney  and Prostate  . CAD Father   . Diabetes Brother        [employer]  . Kidney disease Brother        Tumor removed  . Breast cancer Neg Hx     Social History   Socioeconomic History  . Marital status: Single    Spouse name: Not on file  . Number of children: 0  . Years of education: Not on file  . Highest education level: 12th grade  Occupational History  . Not on file  Social Needs  . Financial resource  strain: Not hard at all  . Food insecurity    Worry: Never true    Inability: Never true  . Transportation needs    Medical: No    Non-medical: No  Tobacco Use  . Smoking status: Never Smoker  . Smokeless tobacco: Never Used  Substance and Sexual Activity  . Alcohol use: No    Alcohol/week: 0.0 standard drinks  . Drug use: No  . Sexual activity: Never  Lifestyle  . Physical activity    Days per week: 5 days    Minutes per session: 10 min  . Stress: Not at all  Relationships  . Social Herbalist on phone: Three times a week    Gets together: More than three times a week    Attends religious service: More than 4 times per year    Active member of club or organization: Yes    Attends meetings of clubs or organizations: More than 4 times per year    Relationship status: Never married  . Intimate partner violence    Fear of current or ex partner: No    Emotionally abused: No    Physically abused: No    Forced sexual activity: No  Other Topics Concern  . Not on file  Social History Narrative  . Not on file     Current Outpatient Medications:  .  aspirin EC 81 MG tablet, Take 81 mg by mouth daily., Disp: , Rfl:  .  atorvastatin (LIPITOR) 40 MG tablet, Take 1 tablet (40 mg total) by mouth daily., Disp: 90 tablet, Rfl: 1 .  chlorzoxazone (PARAFON) 500 MG tablet, Take 1 tablet (500 mg total) by mouth 3 (three) times daily as needed., Disp: 30 tablet, Rfl: 0 .  cholecalciferol (VITAMIN D) 1000 units tablet, Take 500 Units by mouth daily. , Disp: , Rfl:  .  diclofenac sodium (VOLTAREN) 1 % GEL, Apply 4 g topically 4 (four) times daily. (Patient taking differently: Apply 4 g topically as needed. ), Disp: 100 g, Rfl: 1 .  fluticasone (FLONASE) 50 MCG/ACT nasal spray, Place 2 sprays into both nostrils as needed., Disp: 16 g, Rfl: 2 .  glucose blood (ONE TOUCH ULTRA TEST) test strip, USE AS DIRECTED, Disp: 100 each, Rfl: 12 .  levocetirizine (XYZAL) 5 MG tablet, Take 1  tablet by mouth once daily, Disp: 90 tablet, Rfl: 1 .  MAGNESIUM OXIDE PO, Take 500 mg by mouth daily. , Disp: , Rfl:  .  Menthol, Topical Analgesic, 4 % GEL, Apply 2 g topically 4 (four) times daily., Disp: 1 Tube, Rfl: 1 .  metFORMIN (GLUCOPHAGE-XR) 750 MG 24 hr tablet, Take 2 tablets (1,500 mg total) by mouth daily with breakfast., Disp: 180 tablet, Rfl: 1 .  montelukast (SINGULAIR) 10 MG tablet, Take 1 tablet (10 mg total) by mouth at bedtime. (Patient taking differently: Take 10 mg by mouth as needed. ), Disp:  90 tablet, Rfl: 1 .  olmesartan-hydrochlorothiazide (BENICAR HCT) 20-12.5 MG tablet, Take 1 tablet by mouth daily., Disp: 90 tablet, Rfl: 1 .  polyethylene glycol powder (GLYCOLAX/MIRALAX) powder, Take 17 g by mouth 2 (two) times daily. (Patient taking differently: Take 17 g by mouth as needed. ), Disp: 3350 g, Rfl: 1 .  Turmeric 500 MG TABS, Take 1 tablet by mouth daily., Disp: , Rfl:  .  XARELTO 20 MG TABS tablet, TAKE 1 TABLET BY MOUTH ONCE DAILY WITH SUPPER, Disp: 90 tablet, Rfl: 0  No Known Allergies  I personally reviewed active problem list, medication list, allergies, family history, social history, health maintenance with the patient/caregiver today.   ROS  Constitutional: Negative for fever or weight change.  Respiratory: Negative for cough and shortness of breath.   Cardiovascular: Negative for chest pain or palpitations.  Gastrointestinal: Negative for abdominal pain, no bowel changes.  Musculoskeletal: Negative for gait problem or joint swelling.  Skin: Negative for rash.  Neurological: Negative for dizziness or headache.  No other specific complaints in a complete review of systems (except as listed in HPI above).  Objective  Vitals:   01/02/19 0838  BP: 128/82  Pulse: 87  Resp: 16  Temp: 97.7 F (36.5 C)  TempSrc: Temporal  SpO2: 99%  Weight: 225 lb 9.6 oz (102.3 kg)  Height: 5\' 9"  (1.753 m)    Body mass index is 33.32 kg/m.  Physical  Exam  Constitutional: Patient appears well-developed and well-nourished. Obese  No distress.  HEENT: head atraumatic, normocephalic, pupils equal and reactive to light,  Cardiovascular: Normal rate, regular rhythm and normal heart sounds.  No murmur heard. No BLE edema. Pulmonary/Chest: Effort normal and breath sounds normal. No respiratory distress. Abdominal: Soft.  There is no tenderness. Psychiatric: Patient has a normal mood and affect. behavior is normal. Judgment and thought content normal.  Recent Results (from the past 2160 hour(s))  Comprehensive metabolic panel     Status: Abnormal   Collection Time: 11/07/18  3:52 PM  Result Value Ref Range   Sodium 134 (L) 135 - 145 mmol/L   Potassium 4.4 3.5 - 5.1 mmol/L   Chloride 99 98 - 111 mmol/L   CO2 26 22 - 32 mmol/L   Glucose, Bld 123 (H) 70 - 99 mg/dL   BUN 13 8 - 23 mg/dL   Creatinine, Ser 0.65 0.44 - 1.00 mg/dL   Calcium 9.5 8.9 - 10.3 mg/dL   Total Protein 7.5 6.5 - 8.1 g/dL   Albumin 3.8 3.5 - 5.0 g/dL   AST 18 15 - 41 U/L   ALT 21 0 - 44 U/L   Alkaline Phosphatase 85 38 - 126 U/L   Total Bilirubin 0.4 0.3 - 1.2 mg/dL   GFR calc non Af Amer >60 >60 mL/min   GFR calc Af Amer >60 >60 mL/min   Anion gap 9 5 - 15    Comment: Performed at [facility], [address], [place], [place] [address]  Ferritin     Status: Abnormal   Collection Time: 11/07/18  3:52 PM  Result Value Ref Range   Ferritin 7 (L) 11 - 307 ng/mL    Comment: Performed at [facility], [address] [place], [place] [address]  CBC with Differential/Platelet     Status: Abnormal   Collection Time: 11/07/18  3:52 PM  Result Value Ref Range   WBC 9.0 4.0 - 10.5 K/uL   RBC 4.39 3.87 - 5.11 MIL/uL  Hemoglobin 11.5 (L) 12.0 - 15.0 g/dL   HCT 35.7 (L) 36.0 - 46.0 %   MCV 81.3 80.0 - 100.0 fL   MCH 26.2 26.0 - 34.0 pg   MCHC 32.2 30.0 - 36.0 g/dL   RDW 14.8 11.5 - 15.5 %   Platelets 222 150 - 400 K/uL   nRBC 0.0 0.0 - 0.2 %    Neutrophils Relative % 62 %   Neutro Abs 5.4 1.7 - 7.7 K/uL   Lymphocytes Relative 27 %   Lymphs Abs 2.5 0.7 - 4.0 K/uL   Monocytes Relative 10 %   Monocytes Absolute 0.9 0.1 - 1.0 K/uL   Eosinophils Relative 1 %   Eosinophils Absolute 0.1 0.0 - 0.5 K/uL   Basophils Relative 0 %   Basophils Absolute 0.0 0.0 - 0.1 K/uL   Immature Granulocytes 0 %   Abs Immature Granulocytes 0.03 0.00 - 0.07 K/uL    Comment: Performed at [facility], [address] [place], [place] [address]      PHQ2/9: Depression screen [facility] 2/9 01/02/2019 07/12/2018 11/04/2017 03/29/2017 10/30/2016  Decreased Interest 0 0 0 0 0  Down, Depressed, Hopeless 0 0 0 0 0  PHQ - 2 Score 0 0 0 0 0  Altered sleeping 0 1 - - -  Tired, decreased energy 0 1 - - -  Change in appetite 0 0 - - -  Feeling bad or failure about yourself  0 0 - - -  Trouble concentrating 0 0 - - -  Moving slowly or fidgety/restless 0 0 - - -  Suicidal thoughts 0 0 - - -  PHQ-9 Score 0 2 - - -  Difficult doing work/chores - Not difficult at all - - -    phq 9 is negative   Fall Risk: Fall Risk  01/02/2019 07/12/2018 11/04/2017 09/10/2017 06/23/2017  Falls in the past year? 0 0 No No No  Number falls in past yr: 0 0 - - -  Injury with Fall? 0 0 - - -    Functional Status Survey: Is the patient deaf or have difficulty hearing?: No Does the patient have difficulty seeing, even when wearing glasses/contacts?: No Does the patient have difficulty concentrating, remembering, or making decisions?: No Does the patient have difficulty walking or climbing stairs?: No Does the patient have difficulty dressing or bathing?: No Does the patient have difficulty doing errands alone such as visiting a doctor's office or shopping?: No   Assessment & Plan   1. Type 2 diabetes mellitus with microalbuminuria, without long-term current use of insulin (HCC)  - POCT HgB A1C - metFORMIN (GLUCOPHAGE-XR) 750 MG 24 hr tablet; Take 2 tablets (1,500 mg  total) by mouth daily with breakfast.  Dispense: 180 tablet; Refill: 1  2. Need for Tdap vaccination  - Tdap vaccine greater than or equal to 7yo IM  3. Need for immunization against influenza  - Flu Vaccine QUAD 36+ mos IM  4. Benign essential HTN  - olmesartan-hydrochlorothiazide (BENICAR HCT) 20-12.5 MG tablet; Take 1 tablet by mouth daily.  Dispense: 90 tablet; Refill: 1 - COMPLETE METABOLIC PANEL WITH GFR  5. Dyslipidemia  - atorvastatin (LIPITOR) 40 MG tablet; Take 1 tablet (40 mg total) by mouth daily.  Dispense: 90 tablet; Refill: 1 - Lipid panel  6. Vitamin D deficiency  Continue otc supplementation   7. Factor 5 Leiden mutation, heterozygous [facility])  Keep follow up with Dr. Mike Gip   8. Other iron deficiency anemia  -  CBC with Differential/Platelet - Iron, TIBC and Ferritin Panel  9. Portal vein thrombosis   10. Primary osteoarthritis of right knee  Stable   11. Gastro-esophageal reflux disease without esophagitis  Controlled at this time  12. Perennial allergic rhinitis with seasonal variation  Continue medication

## 2019-01-03 LAB — CBC WITH DIFFERENTIAL/PLATELET
Absolute Monocytes: 595 cells/uL (ref 200–950)
Basophils Absolute: 34 cells/uL (ref 0–200)
Basophils Relative: 0.4 %
Eosinophils Absolute: 68 cells/uL (ref 15–500)
Eosinophils Relative: 0.8 %
HCT: 42.3 % (ref 35.0–45.0)
Hemoglobin: 13.8 g/dL (ref 11.7–15.5)
Lymphs Abs: 2125 cells/uL (ref 850–3900)
MCH: 27.4 pg (ref 27.0–33.0)
MCHC: 32.6 g/dL (ref 32.0–36.0)
MCV: 83.9 fL (ref 80.0–100.0)
MPV: 10.4 fL (ref 7.5–12.5)
Monocytes Relative: 7 %
Neutro Abs: 5678 cells/uL (ref 1500–7800)
Neutrophils Relative %: 66.8 %
Platelets: 238 10*3/uL (ref 140–400)
RBC: 5.04 10*6/uL (ref 3.80–5.10)
RDW: 16.8 % — ABNORMAL HIGH (ref 11.0–15.0)
Total Lymphocyte: 25 %
WBC: 8.5 10*3/uL (ref 3.8–10.8)

## 2019-01-03 LAB — COMPLETE METABOLIC PANEL WITH GFR
AG Ratio: 1.7 (calc) (ref 1.0–2.5)
ALT: 27 U/L (ref 6–29)
AST: 19 U/L (ref 10–35)
Albumin: 4.5 g/dL (ref 3.6–5.1)
Alkaline phosphatase (APISO): 91 U/L (ref 37–153)
BUN: 10 mg/dL (ref 7–25)
CO2: 29 mmol/L (ref 20–32)
Calcium: 10.3 mg/dL (ref 8.6–10.4)
Chloride: 103 mmol/L (ref 98–110)
Creat: 0.79 mg/dL (ref 0.50–0.99)
GFR, Est African American: 92 mL/min/{1.73_m2} (ref >=60)
GFR, Est Non African American: 79 mL/min/{1.73_m2} (ref >=60)
Globulin: 2.6 g/dL (calc) (ref 1.9–3.7)
Glucose, Bld: 109 mg/dL — ABNORMAL HIGH (ref 65–99)
Potassium: 5.1 mmol/L (ref 3.5–5.3)
Sodium: 141 mmol/L (ref 135–146)
Total Bilirubin: 0.5 mg/dL (ref 0.2–1.2)
Total Protein: 7.1 g/dL (ref 6.1–8.1)

## 2019-01-03 LAB — LIPID PANEL
Cholesterol: 130 mg/dL (ref <200)
HDL: 51 mg/dL (ref >=50)
LDL Cholesterol (Calc): 56 mg/dL (calc)
Non-HDL Cholesterol (Calc): 79 mg/dL (calc) (ref <130)
Total CHOL/HDL Ratio: 2.5 (calc) (ref <5.0)
Triglycerides: 156 mg/dL — ABNORMAL HIGH (ref <150)

## 2019-01-03 LAB — IRON,TIBC AND FERRITIN PANEL
%SAT: 17 % (calc) (ref 16–45)
Ferritin: 50 ng/mL (ref 16–288)
Iron: 61 ug/dL (ref 45–160)
TIBC: 363 mcg/dL (calc) (ref 250–450)

## 2019-01-19 ENCOUNTER — Other Ambulatory Visit: Payer: Self-pay | Admitting: Hematology and Oncology

## 2019-01-19 DIAGNOSIS — I81 Portal vein thrombosis: Secondary | ICD-10-CM

## 2019-01-19 DIAGNOSIS — D6851 Activated protein C resistance: Secondary | ICD-10-CM

## 2019-02-09 ENCOUNTER — Other Ambulatory Visit: Payer: Self-pay | Admitting: Family Medicine

## 2019-02-09 DIAGNOSIS — Z1231 Encounter for screening mammogram for malignant neoplasm of breast: Secondary | ICD-10-CM

## 2019-02-10 ENCOUNTER — Other Ambulatory Visit: Payer: Self-pay | Admitting: Family Medicine

## 2019-02-14 ENCOUNTER — Ambulatory Visit
Admission: RE | Admit: 2019-02-14 | Discharge: 2019-02-14 | Disposition: A | Discharge status: Home / Self Care | Payer: BC Managed Care – PPO | Source: Ambulatory Visit | Attending: Family Medicine | Admitting: Family Medicine

## 2019-02-14 DIAGNOSIS — Z1231 Encounter for screening mammogram for malignant neoplasm of breast: Secondary | ICD-10-CM | POA: Insufficient documentation

## 2019-02-15 ENCOUNTER — Other Ambulatory Visit: Payer: Self-pay | Admitting: Family Medicine

## 2019-02-15 DIAGNOSIS — J302 Other seasonal allergic rhinitis: Secondary | ICD-10-CM

## 2019-03-01 ENCOUNTER — Encounter: Payer: Self-pay | Admitting: Family Medicine

## 2019-03-01 ENCOUNTER — Other Ambulatory Visit: Payer: Self-pay

## 2019-03-01 ENCOUNTER — Ambulatory Visit (INDEPENDENT_AMBULATORY_CARE_PROVIDER_SITE_OTHER): Payer: BC Managed Care – PPO | Admitting: Family Medicine

## 2019-03-01 VITALS — BP 120/68 | HR 83 | Temp 97.5°F | Resp 16 | Ht 68.5 in | Wt 225.2 lb

## 2019-03-01 DIAGNOSIS — Z23 Encounter for immunization: Secondary | ICD-10-CM

## 2019-03-01 DIAGNOSIS — Z0000 Encounter for general adult medical examination without abnormal findings: Secondary | ICD-10-CM

## 2019-03-01 NOTE — Progress Notes (Signed)
Name: Emily Pitts   MRN: [id number]    DOB: 03-17-1955   Date:03/01/2019       Progress Note  Subjective  Chief Complaint  Chief Complaint  Patient presents with  . Annual Exam    HPI  Patient presents for annual CPE.  Diet: trying to be mindful  Exercise: walks her dog , advised 30 minutes 5 days a week   USPSTF grade A and B recommendations    Office Visit from 03/01/2019 in [facility]  AUDIT-C Score  0     Depression: Phq 9 is  negative Depression screen [facility] 2/9 03/01/2019 03/01/2019 01/02/2019 07/12/2018 11/04/2017  Decreased Interest 0 0 0 0 0  Down, Depressed, Hopeless 0 0 0 0 0  PHQ - 2 Score 0 0 0 0 0  Altered sleeping 0 0 0 1 -  Tired, decreased energy 0 0 0 1 -  Change in appetite 0 0 0 0 -  Feeling bad or failure about yourself  0 0 0 0 -  Trouble concentrating 0 0 0 0 -  Moving slowly or fidgety/restless 0 0 0 0 -  Suicidal thoughts 0 0 0 0 -  PHQ-9 Score 0 0 0 2 -  Difficult doing work/chores - - - Not difficult at all -   Hypertension: BP Readings from Last 3 Encounters:  03/01/19 120/68  01/02/19 128/82  11/29/18 120/73   Obesity: Wt Readings from Last 3 Encounters:  03/01/19 225 lb 3.2 oz (102.2 kg)  01/02/19 225 lb 9.6 oz (102.3 kg)  11/07/18 227 lb 1.2 oz (103 kg)   BMI Readings from Last 3 Encounters:  03/01/19 33.74 kg/m  01/02/19 33.32 kg/m  11/07/18 33.53 kg/m     Hep C Screening: 2013  STD testing and prevention (HIV/chl/gon/syphilis): N/A Intimate partner violence: negative screen  Pain during Intercourse:not sexually active Menstrual History/LMP/Abnormal Bleeding: s/p hysterectomy supra-cervical and we will repeat pap smear in 2022 Incontinence Symptoms: stress incontinence mild   Breast cancer:  - Last Mammogram: 02/14/2019 - BRCA gene screening: N/A  Osteoporosis: Discussed high calcium and vitamin D supplementation, weight bearing exercises  Cervical cancer screening: s/p hysterectomy   Skin  cancer: Discussed monitoring for atypical lesions  Colorectal cancer: 06/2013 Lung cancer:  Low Dose CT Chest recommended if Age 3-80 years, 30 pack-year currently smoking OR have quit w/in 15years. Patient does not qualify.   ECG: 2016  Advanced Care Planning: A voluntary discussion about advance care planning including the explanation and discussion of advance directives.  Discussed health care proxy and Living will, and the patient was able to identify a health care proxy as Ardeen Garland [phone] .  Patient does have a living will at present time.   Lipids: Lab Results  Component Value Date   CHOL 130 01/02/2019   CHOL 130 11/04/2017   CHOL 122 02/26/2017   Lab Results  Component Value Date   HDL 51 01/02/2019   HDL 55 11/04/2017   HDL 53 02/26/2017   Lab Results  Component Value Date   LDLCALC 56 01/02/2019   LDLCALC 55 11/04/2017   LDLCALC 50 02/26/2017   Lab Results  Component Value Date   TRIG 156 (H) 01/02/2019   TRIG 112 11/04/2017   TRIG 102 02/26/2017   Lab Results  Component Value Date   CHOLHDL 2.5 01/02/2019   CHOLHDL 2.4 11/04/2017   CHOLHDL 2.3 02/26/2017   No results found for: LDLDIRECT  Glucose: Glucose, Bld  Date Value  Ref Range Status  01/02/2019 109 (H) 65 - 99 mg/dL Final    Comment:    .            Fasting reference interval . For someone without known diabetes, a glucose value between 100 and 125 mg/dL is consistent with prediabetes and should be confirmed with a follow-up test. .   11/07/2018 123 (H) 70 - 99 mg/dL Final  05/05/2018 135 (H) 70 - 99 mg/dL Final   Glucose-Capillary  Date Value Ref Range Status  10/25/2014 113 (H) 65 - 99 mg/dL Final  10/24/2014 82 65 - 99 mg/dL Final  10/24/2014 86 65 - 99 mg/dL Final    Patient Active Problem List   Diagnosis Date Noted  . Iron deficiency anemia 02/14/2018  . 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 Long term current use of anticoagulant therapy 01/05/2016  .  Factor 5 Leiden mutation, heterozygous ([place]) 05/02/2015  . Portal vein thrombosis 11/07/2014  . History of sepsis 10/10/2014  . Abnormal electrocardiogram 10/08/2014  . Nonspecific abnormal electromyogram (EMG) 10/08/2014  . Benign essential HTN 10/08/2014  . Diabetes mellitus with renal manifestation ([place]) 10/08/2014  . Dyslipidemia 10/08/2014  . LBP (low back pain) 10/08/2014  . Gastro-esophageal reflux disease without esophagitis 10/08/2014  . Microalbuminuria 10/08/2014  . Disturbance of skin sensation 10/08/2014  . Obesity (BMI 30-39.9) 10/08/2014  . Perennial allergic rhinitis with seasonal variation 10/08/2014  . Plantar fasciitis 10/08/2014  . Postablative ovarian failure 10/08/2014  . Menopausal symptom 10/08/2014  . Vitamin D deficiency 10/08/2014    Past Surgical History:  Procedure Laterality Date  . ABDOMINAL HYSTERECTOMY  2010  . APPENDECTOMY  1966  . COLONOSCOPY  06-07-13   Dr Bary Castilla  . DILATION AND CURETTAGE OF UTERUS    . OOPHORECTOMY      Family History  Problem Relation Age of Onset  . Diabetes Mother   . COPD Mother   . Diabetes Father   . Hypertension Father   . Cancer Father        Kidney and Prostate  . CAD Father   . Diabetes Brother        [employer]  . Kidney disease Brother        Tumor removed  . Breast cancer Neg Hx     Social History   Socioeconomic History  . Marital status: Single    Spouse name: Not on file  . Number of children: 0  . Years of education: Not on file  . Highest education level: 12th grade  Occupational History  . Not on file  Tobacco Use  . Smoking status: Never Smoker  . Smokeless tobacco: Never Used  Substance and Sexual Activity  . Alcohol use: No    Alcohol/week: 0.0 standard drinks  . Drug use: No  . Sexual activity: Never  Other Topics Concern  . Not on file  Social History Narrative  . Not on file   Social Determinants of Health   Financial Resource Strain: Low Risk   . Difficulty of  Paying Living Expenses: Not hard at all  Food Insecurity: No Food Insecurity  . Worried About Charity fundraiser in the Last Year: Never true  . Ran Out of Food in the Last Year: Never true  Transportation Needs: No Transportation Needs  . Lack of Transportation (Medical): No  . Lack of Transportation (Non-Medical): No  Physical Activity: Insufficiently Active  . Days of Exercise per Week: 7 days  . Minutes of Exercise  per Session: 20 min  Stress: No Stress Concern Present  . Feeling of Stress : Not at all  Social Connections: Slightly Isolated  . Frequency of Communication with Friends and Family: More than three times a week  . Frequency of Social Gatherings with Friends and Family: More than three times a week  . Attends Religious Services: More than 4 times per year  . Active Member of Clubs or Organizations: Yes  . Attends Archivist Meetings: More than 4 times per year  . Marital Status: Never married  Intimate Partner Violence: Not At Risk  . Fear of Current or Ex-Partner: No  . Emotionally Abused: No  . Physically Abused: No  . Sexually Abused: No     Current Outpatient Medications:  .  aspirin EC 81 MG tablet, Take 81 mg by mouth daily., Disp: , Rfl:  .  atorvastatin (LIPITOR) 40 MG tablet, Take 1 tablet (40 mg total) by mouth daily., Disp: 90 tablet, Rfl: 1 .  chlorzoxazone (PARAFON) 500 MG tablet, Take 1 tablet (500 mg total) by mouth 3 (three) times daily as needed., Disp: 30 tablet, Rfl: 0 .  cholecalciferol (VITAMIN D) 1000 units tablet, Take 500 Units by mouth daily. , Disp: , Rfl:  .  diclofenac sodium (VOLTAREN) 1 % GEL, Apply 4 g topically 4 (four) times daily. (Patient taking differently: Apply 4 g topically as needed. ), Disp: 100 g, Rfl: 1 .  fluticasone (FLONASE) 50 MCG/ACT nasal spray, USE 2 SPRAY(S) IN EACH NOSTRIL AS NEEDED, Disp: 48 g, Rfl: 0 .  glucose blood (ONE TOUCH ULTRA TEST) test strip, USE AS DIRECTED, Disp: 100 each, Rfl: 12 .   levocetirizine (XYZAL) 5 MG tablet, Take 1 tablet by mouth once daily, Disp: 90 tablet, Rfl: 1 .  MAGNESIUM OXIDE PO, Take 500 mg by mouth daily. , Disp: , Rfl:  .  Menthol, Topical Analgesic, 4 % GEL, Apply 2 g topically 4 (four) times daily., Disp: 1 Tube, Rfl: 1 .  metFORMIN (GLUCOPHAGE-XR) 750 MG 24 hr tablet, Take 2 tablets (1,500 mg total) by mouth daily with breakfast., Disp: 180 tablet, Rfl: 1 .  montelukast (SINGULAIR) 10 MG tablet, TAKE 1 TABLET BY MOUTH AT BEDTIME, Disp: 90 tablet, Rfl: 0 .  olmesartan-hydrochlorothiazide (BENICAR HCT) 20-12.5 MG tablet, Take 1 tablet by mouth daily., Disp: 90 tablet, Rfl: 1 .  polyethylene glycol powder (GLYCOLAX/MIRALAX) powder, Take 17 g by mouth 2 (two) times daily. (Patient taking differently: Take 17 g by mouth as needed. ), Disp: 3350 g, Rfl: 1 .  Turmeric 500 MG TABS, Take 1 tablet by mouth daily., Disp: , Rfl:  .  XARELTO 20 MG TABS tablet, TAKE 1 TABLET BY MOUTH ONCE DAILY WITH SUPPER, Disp: 90 tablet, Rfl: 0  No Known Allergies   ROS  Constitutional: Negative for fever or weight change.  Respiratory: Negative for cough and shortness of breath.   Cardiovascular: Negative for chest pain or palpitations.  Gastrointestinal: Negative for abdominal pain, no bowel changes.  Musculoskeletal: Negative for gait problem or joint swelling.  Skin: Negative for rash.  Neurological: Negative for dizziness or headache.  No other specific complaints in a complete review of systems (except as listed in HPI above).  Objective  Vitals:   03/01/19 0823  BP: 120/68  Pulse: 83  Resp: 16  Temp: (!) 97.5 F (36.4 C)  TempSrc: Temporal  SpO2: 95%  Weight: 225 lb 3.2 oz (102.2 kg)  Height: 5' 8.5" (1.74 m)  Body mass index is 33.74 kg/m.  Physical Exam  Constitutional: Patient appears well-developed and well-nourished. No distress.  HENT: Head: Normocephalic and atraumatic. Ears: B TMs ok, no erythema or effusion; Nose: Nose normal.  Mouth/Throat: not done Eyes: Conjunctivae and EOM are normal. Pupils are equal, round, and reactive to light. No scleral icterus.  Neck: Normal range of motion. Neck supple. No JVD present. No thyromegaly present.  Cardiovascular: Normal rate, regular rhythm and normal heart sounds.  No murmur heard. No BLE edema. Pulmonary/Chest: Effort normal and breath sounds normal. No respiratory distress. Abdominal: Soft. Bowel sounds are normal, no distension. There is no tenderness. no masses Breast: no lumps or masses, no nipple discharge or rashes FEMALE GENITALIA:  Not done RECTAL: not done Musculoskeletal: Normal range of motion, no joint effusions. No gross deformities Neurological: he is alert and oriented to person, place, and time. No cranial nerve deficit. Coordination, balance, strength, speech and gait are normal.  Skin: Skin is warm and dry. No rash noted. No erythema.  Psychiatric: Patient has a normal mood and affect. behavior is normal. Judgment and thought content normal.   Recent Results (from the past 2160 hour(s))  POCT HgB A1C     Status: Abnormal   Collection Time: 01/02/19  8:41 AM  Result Value Ref Range   Hemoglobin A1C 6.1 (A) 4.0 - 5.6 %   HbA1c POC (<> result, manual entry)     HbA1c, POC (prediabetic range)     HbA1c, POC (controlled diabetic range)    CBC with Differential/Platelet     Status: Abnormal   Collection Time: 01/02/19  9:09 AM  Result Value Ref Range   WBC 8.5 3.8 - 10.8 Thousand/uL   RBC 5.04 3.80 - 5.10 Million/uL   Hemoglobin 13.8 11.7 - 15.5 g/dL   HCT 42.3 35.0 - 45.0 %   MCV 83.9 80.0 - 100.0 fL   MCH 27.4 27.0 - 33.0 pg   MCHC 32.6 32.0 - 36.0 g/dL   RDW 16.8 (H) 11.0 - 15.0 %   Platelets 238 140 - 400 Thousand/uL   MPV 10.4 7.5 - 12.5 fL   Neutro Abs 5,678 1,500 - 7,800 cells/uL   Lymphs Abs 2,125 850 - 3,900 cells/uL   Absolute Monocytes 595 200 - 950 cells/uL   Eosinophils Absolute 68 15 - 500 cells/uL   Basophils Absolute 34 0 - 200  cells/uL   Neutrophils Relative % 66.8 %   Total Lymphocyte 25.0 %   Monocytes Relative 7.0 %   Eosinophils Relative 0.8 %   Basophils Relative 0.4 %  COMPLETE METABOLIC PANEL WITH GFR     Status: Abnormal   Collection Time: 01/02/19  9:09 AM  Result Value Ref Range   Glucose, Bld 109 (H) 65 - 99 mg/dL    Comment: .            Fasting reference interval . For someone without known diabetes, a glucose value between 100 and 125 mg/dL is consistent with prediabetes and should be confirmed with a follow-up test. .    BUN 10 7 - 25 mg/dL   Creat 0.79 0.50 - 0.99 mg/dL    Comment: For patients >59 years of age, the reference limit for Creatinine is approximately 13% higher for people identified as African-American. .    GFR, Est Non African American 79 > OR = 60 mL/min/1.33m   GFR, Est African American 92 > OR = 60 mL/min/1.733m  BUN/Creatinine Ratio NOT APPLICABLE 6 - 22 (  calc)   Sodium 141 135 - 146 mmol/L   Potassium 5.1 3.5 - 5.3 mmol/L   Chloride 103 98 - 110 mmol/L   CO2 29 20 - 32 mmol/L   Calcium 10.3 8.6 - 10.4 mg/dL   Total Protein 7.1 6.1 - 8.1 g/dL   Albumin 4.5 3.6 - 5.1 g/dL   Globulin 2.6 1.9 - 3.7 g/dL (calc)   AG Ratio 1.7 1.0 - 2.5 (calc)   Total Bilirubin 0.5 0.2 - 1.2 mg/dL   Alkaline phosphatase (APISO) 91 37 - 153 U/L   AST 19 10 - 35 U/L   ALT 27 6 - 29 U/L  Lipid panel     Status: Abnormal   Collection Time: 01/02/19  9:09 AM  Result Value Ref Range   Cholesterol 130 <200 mg/dL   HDL 51 > OR = 50 mg/dL   Triglycerides 156 (H) <150 mg/dL   LDL Cholesterol (Calc) 56 mg/dL (calc)    Comment: Reference range: <100 . Desirable range <100 mg/dL for primary prevention;   <70 mg/dL for patients with CHD or diabetic patients  with > or = 2 CHD risk factors. Marland Kitchen LDL-C is now calculated using the Martin-Hopkins  calculation, which is a validated novel method providing  better accuracy than the Friedewald equation in the  estimation of LDL-C.  Cresenciano Genre et al. Annamaria Helling. 0454;098(11): 2061-2068  (http://education.[facility].com/faq/FAQ164)    Total CHOL/HDL Ratio 2.5 <5.0 (calc)   Non-HDL Cholesterol (Calc) 79 <130 mg/dL (calc)    Comment: For patients with diabetes plus 1 major ASCVD risk  factor, treating to a non-HDL-C goal of <100 mg/dL  (LDL-C of <70 mg/dL) is considered a therapeutic  option.   Iron, TIBC and Ferritin Panel     Status: None   Collection Time: 01/02/19  9:09 AM  Result Value Ref Range   Iron 61 45 - 160 mcg/dL   TIBC 363 250 - 450 mcg/dL (calc)   %SAT 17 16 - 45 % (calc)   Ferritin 50 16 - 288 ng/mL     Fall Risk: Fall Risk  03/01/2019 01/02/2019 07/12/2018 11/04/2017 09/10/2017  Falls in the past year? 0 0 0 No No  Number falls in past yr: 0 0 0 - -  Injury with Fall? 0 0 0 - -     Functional Status Survey: Is the patient deaf or have difficulty hearing?: No Does the patient have difficulty seeing, even when wearing glasses/contacts?: No Does the patient have difficulty concentrating, remembering, or making decisions?: No Does the patient have difficulty walking or climbing stairs?: No Does the patient have difficulty dressing or bathing?: No Does the patient have difficulty doing errands alone such as visiting a doctor's office or shopping?: No   Assessment & Plan  1. Well adult exam   2. Need for shingles vaccine  - Shingrix  -USPSTF grade A and B recommendations reviewed with patient; age-appropriate recommendations, preventive care, screening tests, etc discussed and encouraged; healthy living encouraged; see AVS for patient education given to patient -Discussed importance of 150 minutes of physical activity weekly, eat two servings of fish weekly, eat one serving of tree nuts ( cashews, pistachios, pecans, almonds.Marland Kitchen) every other day, eat 6 servings of fruit/vegetables daily and drink plenty of water and avoid sweet beverages.

## 2019-03-01 NOTE — Patient Instructions (Signed)

## 2019-04-24 ENCOUNTER — Other Ambulatory Visit: Payer: Self-pay | Admitting: Hematology and Oncology

## 2019-04-24 DIAGNOSIS — I81 Portal vein thrombosis: Secondary | ICD-10-CM

## 2019-04-24 DIAGNOSIS — D6851 Activated protein C resistance: Secondary | ICD-10-CM

## 2019-05-05 ENCOUNTER — Ambulatory Visit (INDEPENDENT_AMBULATORY_CARE_PROVIDER_SITE_OTHER): Payer: BC Managed Care – PPO | Admitting: Family Medicine

## 2019-05-05 ENCOUNTER — Encounter: Payer: Self-pay | Admitting: Family Medicine

## 2019-05-05 ENCOUNTER — Other Ambulatory Visit: Payer: Self-pay

## 2019-05-05 VITALS — BP 124/76 | HR 100 | Temp 97.5°F | Resp 16 | Ht 68.75 in | Wt 229.1 lb

## 2019-05-05 DIAGNOSIS — J3089 Other allergic rhinitis: Secondary | ICD-10-CM

## 2019-05-05 DIAGNOSIS — E1129 Type 2 diabetes mellitus with other diabetic kidney complication: Secondary | ICD-10-CM

## 2019-05-05 DIAGNOSIS — R809 Proteinuria, unspecified: Secondary | ICD-10-CM

## 2019-05-05 DIAGNOSIS — E559 Vitamin D deficiency, unspecified: Secondary | ICD-10-CM

## 2019-05-05 DIAGNOSIS — D6851 Activated protein C resistance: Secondary | ICD-10-CM

## 2019-05-05 DIAGNOSIS — I10 Essential (primary) hypertension: Secondary | ICD-10-CM | POA: Diagnosis not present

## 2019-05-05 DIAGNOSIS — Z23 Encounter for immunization: Secondary | ICD-10-CM

## 2019-05-05 DIAGNOSIS — J302 Other seasonal allergic rhinitis: Secondary | ICD-10-CM

## 2019-05-05 DIAGNOSIS — M545 Low back pain, unspecified: Secondary | ICD-10-CM

## 2019-05-05 DIAGNOSIS — Z8639 Personal history of other endocrine, nutritional and metabolic disease: Secondary | ICD-10-CM

## 2019-05-05 LAB — POCT GLYCOSYLATED HEMOGLOBIN (HGB A1C): HbA1c, POC (controlled diabetic range): 6.4 % (ref 0.0–7.0)

## 2019-05-05 MED ORDER — AZELASTINE HCL 0.1 % NA SOLN: 2.0000 | Freq: Two times a day (BID) | NASAL | 2 refills | Status: DC

## 2019-05-05 MED ORDER — CHLORZOXAZONE 500 MG PO TABS: 500.0000 mg | ORAL_TABLET | Freq: Three times a day (TID) | ORAL | 0 refills | Status: DC | PRN

## 2019-05-05 MED ORDER — MONTELUKAST SODIUM 10 MG PO TABS: 10.0000 mg | ORAL_TABLET | Freq: Every day | ORAL | 0 refills | Status: DC

## 2019-05-05 MED ORDER — TIZANIDINE HCL 2 MG PO TABS: 2.0000 mg | ORAL_TABLET | Freq: Four times a day (QID) | ORAL | 0 refills | Status: DC | PRN

## 2019-05-05 NOTE — Progress Notes (Signed)
Name: Emily Pitts   MRN: [id number]    DOB: 05/29/1955   Date:05/05/2019       Progress Note  Subjective  Chief Complaint  Chief Complaint  Patient presents with  . Medication Refill  . Diabetes  . Hypertension  . Gastroesophageal Reflux  . Dyslipidemia  . Perennial AR with seasonal variation  . Constipation  . Portal Vein Thrombosis    HPI  Diabetes Type II with microalbuminuria: she is on Metformin1500  ERtwo pills daily A1 is at goal  She has not been checking her glucose lately.  She deniesblurred vision, polyphagia, polyuria or polydipsia. Chronic nocturia once per night - she drinks a lot of water in the evenings. She is on ARB .  Obesity:sheis eating at home and has been walking more since she got a new dog May 2020. She gained weight since last visit, she states cold and rainy days not as active lately   HTN: she is onBenicar hctzshe is taking one pill daily,no chest pain, palpitation, SOB or dizziness . BP is at goal    Hyperlipidemia: she is now on Atorvastatin and denies side effects .Reviewed labs done Fall 2020, doing well.   GERD: she stopped taking Omeprazole, controlled with life style modification, no heartburn or regurgitation, very seldom has to take Tums. She had a flare over one month ago but is doing well now   Perennial AR with seasonal variation: She still has sneezing episodes we will add astelin today, intermittent  rhinorrhea and headaches, worse in the Spring and Fall. Still taking Xyzalbut only occasionally, takes singulair prn.  Portal Vein Thrombosis: seen hematologistDr. Corcooran, taking Xarelto, denies any bleeding. She has elevated factor V leiden. She was diagnosed with iron deficiency anemia and was given iron infusion and last labs were back to normal   Constipation:  B[place]usually a 4 with medication.Still taking miralax only prn.   Intermittent low back pain: she takes prn medication, no problems at this time but  prescriptions have expired. Intermittent she has radiculitis.    Patient Active Problem List   Diagnosis Date Noted  . Iron deficiency anemia 02/14/2018  . 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 Long term current use of anticoagulant therapy 01/05/2016  . Factor 5 Leiden mutation, heterozygous ([place]) 05/02/2015  . Portal vein thrombosis 11/07/2014  . History of sepsis 10/10/2014  . Abnormal electrocardiogram 10/08/2014  . Nonspecific abnormal electromyogram (EMG) 10/08/2014  . Benign essential HTN 10/08/2014  . Diabetes mellitus with renal manifestation ([place]) 10/08/2014  . Dyslipidemia 10/08/2014  . LBP (low back pain) 10/08/2014  . Gastro-esophageal reflux disease without esophagitis 10/08/2014  . Microalbuminuria 10/08/2014  . Disturbance of skin sensation 10/08/2014  . Obesity (BMI 30-39.9) 10/08/2014  . Perennial allergic rhinitis with seasonal variation 10/08/2014  . Plantar fasciitis 10/08/2014  . Postablative ovarian failure 10/08/2014  . Menopausal symptom 10/08/2014  . Vitamin D deficiency 10/08/2014    Past Surgical History:  Procedure Laterality Date  . ABDOMINAL HYSTERECTOMY  2010  . APPENDECTOMY  1966  . COLONOSCOPY  06-07-13   Dr Bary Castilla  . DILATION AND CURETTAGE OF UTERUS    . OOPHORECTOMY      Family History  Problem Relation Age of Onset  . Diabetes Mother   . COPD Mother   . Diabetes Father   . Hypertension Father   . Cancer Father        Kidney and Prostate  . CAD Father   . Diabetes Brother  Oldest Brother  . Kidney disease Brother        Tumor removed  . Breast cancer Neg Hx     Social History   Tobacco Use  . Smoking status: Never Smoker  . Smokeless tobacco: Never Used  Substance Use Topics  . Alcohol use: No    Alcohol/week: 0.0 standard drinks     Current Outpatient Medications:  .  aspirin EC 81 MG tablet, Take 81 mg by mouth daily., Disp: , Rfl:  .  atorvastatin (LIPITOR) 40 MG tablet, Take 1 tablet (40 mg total) by  mouth daily., Disp: 90 tablet, Rfl: 1 .  chlorzoxazone (PARAFON) 500 MG tablet, Take 1 tablet (500 mg total) by mouth 3 (three) times daily as needed., Disp: 30 tablet, Rfl: 0 .  cholecalciferol (VITAMIN D) 1000 units tablet, Take 500 Units by mouth daily. , Disp: , Rfl:  .  diclofenac sodium (VOLTAREN) 1 % GEL, Apply 4 g topically 4 (four) times daily. (Patient taking differently: Apply 4 g topically as needed. ), Disp: 100 g, Rfl: 1 .  fluticasone (FLONASE) 50 MCG/ACT nasal spray, USE 2 SPRAY(S) IN EACH NOSTRIL AS NEEDED, Disp: 48 g, Rfl: 0 .  glucose blood (ONE TOUCH ULTRA TEST) test strip, USE AS DIRECTED, Disp: 100 each, Rfl: 12 .  levocetirizine (XYZAL) 5 MG tablet, Take 1 tablet by mouth once daily, Disp: 90 tablet, Rfl: 1 .  MAGNESIUM OXIDE PO, Take 500 mg by mouth daily. , Disp: , Rfl:  .  Menthol, Topical Analgesic, 4 % GEL, Apply 2 g topically 4 (four) times daily., Disp: 1 Tube, Rfl: 1 .  metFORMIN (GLUCOPHAGE-XR) 750 MG 24 hr tablet, Take 2 tablets (1,500 mg total) by mouth daily with breakfast., Disp: 180 tablet, Rfl: 1 .  montelukast (SINGULAIR) 10 MG tablet, TAKE 1 TABLET BY MOUTH AT BEDTIME, Disp: 90 tablet, Rfl: 0 .  olmesartan-hydrochlorothiazide (BENICAR HCT) 20-12.5 MG tablet, Take 1 tablet by mouth daily., Disp: 90 tablet, Rfl: 1 .  polyethylene glycol powder (GLYCOLAX/MIRALAX) powder, Take 17 g by mouth 2 (two) times daily. (Patient taking differently: Take 17 g by mouth as needed. ), Disp: 3350 g, Rfl: 1 .  Turmeric 500 MG TABS, Take 1 tablet by mouth daily., Disp: , Rfl:  .  XARELTO 20 MG TABS tablet, TAKE 1 TABLET BY MOUTH ONCE DAILY WITH SUPPER, Disp: 90 tablet, Rfl: 0  No Known Allergies  I personally reviewed active problem list, medication list, allergies, family history, social history, health maintenance with the patient/caregiver today.   ROS  Constitutional: Negative for fever or significant  weight change.  Respiratory: Negative for cough and shortness of  breath.   Cardiovascular: Negative for chest pain or palpitations.  Gastrointestinal: Negative for abdominal pain, no bowel changes.  Musculoskeletal: Negative for gait problem or joint swelling.  Skin: Negative for rash.  Neurological: Negative for dizziness or headache.  No other specific complaints in a complete review of systems (except as listed in HPI above).  Objective  Vitals:   05/05/19 1532  BP: 124/76  Pulse: 100  Resp: 16  Temp: (!) 97.5 F (36.4 C)  TempSrc: Temporal  SpO2: 95%  Weight: 229 lb 1.6 oz (103.9 kg)  Height: 5' 8.75" (1.746 m)    Body mass index is 34.08 kg/m.  Physical Exam  Constitutional: Patient appears well-developed and well-nourished. Obese  No distress.  HEENT: head atraumatic, normocephalic, pupils equal and reactive to light Cardiovascular: Normal rate, regular rhythm and normal heart sounds.  No murmur heard. No BLE edema. Pulmonary/Chest: Effort normal and breath sounds normal. No respiratory distress. Abdominal: Soft.  There is no tenderness. Psychiatric: Patient has a normal mood and affect. behavior is normal. Judgment and thought content normal.  Recent Results (from the past 2160 hour(s))  POCT HgB A1C     Status: Normal   Collection Time: 05/05/19  3:44 PM  Result Value Ref Range   Hemoglobin A1C     HbA1c POC (<> result, manual entry)     HbA1c, POC (prediabetic range)     HbA1c, POC (controlled diabetic range) 6.4 0.0 - 7.0 %     PHQ2/9: Depression screen [facility] 2/9 05/05/2019 05/05/2019 03/01/2019 03/01/2019 01/02/2019  Decreased Interest 0 0 0 0 0  Down, Depressed, Hopeless 0 0 0 0 0  PHQ - 2 Score 0 0 0 0 0  Altered sleeping 3 0 0 0 0  Tired, decreased energy 0 0 0 0 0  Change in appetite 0 0 0 0 0  Feeling bad or failure about yourself  0 0 0 0 0  Trouble concentrating 0 0 0 0 0  Moving slowly or fidgety/restless 0 0 0 0 0  Suicidal thoughts 0 0 0 0 0  PHQ-9 Score 3 0 0 0 0  Difficult doing work/chores Not  difficult at all Not difficult at all - - -    phq 9 is negative   Fall Risk: Fall Risk  05/05/2019 03/01/2019 01/02/2019 07/12/2018 11/04/2017  Falls in the past year? 0 0 0 0 No  Number falls in past yr: 0 0 0 0 -  Injury with Fall? 0 0 0 0 -    Functional Status Survey: Is the patient deaf or have difficulty hearing?: No Does the patient have difficulty seeing, even when wearing glasses/contacts?: No Does the patient have difficulty concentrating, remembering, or making decisions?: No Does the patient have difficulty walking or climbing stairs?: No Does the patient have difficulty dressing or bathing?: No Does the patient have difficulty doing errands alone such as visiting a doctor's office or shopping?: No   Assessment & Plan  1. Type 2 diabetes mellitus with microalbuminuria, without long-term current use of insulin (HCC)  - POCT HgB A1C  2. Need for shingles vaccine  - Varicella-zoster vaccine IM (Shingrix)  3. Perennial allergic rhinitis with seasonal variation  - azelastine (ASTELIN) 0.1 % nasal spray; Place 2 sprays into both nostrils 2 (two) times daily. Use in each nostril as directed  Dispense: 30 mL; Refill: 2 - montelukast (SINGULAIR) 10 MG tablet; Take 1 tablet (10 mg total) by mouth at bedtime.  Dispense: 90 tablet; Refill: 0  4. Benign essential HTN  At goal   5. Vitamin D deficiency  Taking Vitamin D otc   6. Factor 5 Leiden mutation, heterozygous ([place])  On Xarelto   7. History of iron deficiency   8. Intermittent low back pain  - chlorzoxazone (PARAFON) 500 MG tablet; Take 1 tablet (500 mg total) by mouth 3 (three) times daily as needed.  Dispense: 30 tablet; Refill: 0 - tiZANidine (ZANAFLEX) 2 MG tablet; Take 1 tablet (2 mg total) by mouth every 6 (six) hours as needed for muscle spasms.  Dispense: 30 tablet; Refill: 0

## 2019-05-08 ENCOUNTER — Other Ambulatory Visit: Payer: Self-pay

## 2019-05-08 ENCOUNTER — Inpatient Hospital Stay: Payer: BC Managed Care – PPO | Attending: Hematology and Oncology

## 2019-05-08 DIAGNOSIS — D509 Iron deficiency anemia, unspecified: Secondary | ICD-10-CM | POA: Insufficient documentation

## 2019-05-08 DIAGNOSIS — I81 Portal vein thrombosis: Secondary | ICD-10-CM

## 2019-05-08 LAB — COMPREHENSIVE METABOLIC PANEL
ALT: 33 U/L (ref 0–44)
AST: 26 U/L (ref 15–41)
Albumin: 4 g/dL (ref 3.5–5.0)
Alkaline Phosphatase: 84 U/L (ref 38–126)
Anion gap: 14 (ref 5–15)
BUN: 13 mg/dL (ref 8–23)
CO2: 24 mmol/L (ref 22–32)
Calcium: 9.8 mg/dL (ref 8.9–10.3)
Chloride: 100 mmol/L (ref 98–111)
Creatinine, Ser: 0.64 mg/dL (ref 0.44–1.00)
GFR calc Af Amer: >60 mL/min (ref >60)
GFR calc non Af Amer: >60 mL/min (ref >60)
Glucose, Bld: 127 mg/dL — ABNORMAL HIGH (ref 70–99)
Potassium: 4.2 mmol/L (ref 3.5–5.1)
Sodium: 138 mmol/L (ref 135–145)
Total Bilirubin: 0.7 mg/dL (ref 0.3–1.2)
Total Protein: 7.6 g/dL (ref 6.5–8.1)

## 2019-05-08 LAB — CBC WITH DIFFERENTIAL/PLATELET
Abs Immature Granulocytes: 0.03 10*3/uL (ref 0.00–0.07)
Basophils Absolute: 0 10*3/uL (ref 0.0–0.1)
Basophils Relative: 0 %
Eosinophils Absolute: 0.2 10*3/uL (ref 0.0–0.5)
Eosinophils Relative: 2 %
HCT: 40.4 % (ref 36.0–46.0)
Hemoglobin: 13.1 g/dL (ref 12.0–15.0)
Immature Granulocytes: 0 %
Lymphocytes Relative: 28 %
Lymphs Abs: 2.7 10*3/uL (ref 0.7–4.0)
MCH: 27.6 pg (ref 26.0–34.0)
MCHC: 32.4 g/dL (ref 30.0–36.0)
MCV: 85.2 fL (ref 80.0–100.0)
Monocytes Absolute: 0.9 10*3/uL (ref 0.1–1.0)
Monocytes Relative: 9 %
Neutro Abs: 5.8 10*3/uL (ref 1.7–7.7)
Neutrophils Relative %: 61 %
Platelets: 244 10*3/uL (ref 150–400)
RBC: 4.74 MIL/uL (ref 3.87–5.11)
RDW: 14.1 % (ref 11.5–15.5)
WBC: 9.5 10*3/uL (ref 4.0–10.5)
nRBC: 0 % (ref 0.0–0.2)

## 2019-05-08 LAB — FERRITIN: Ferritin: 34 ng/mL (ref 11–307)

## 2019-05-09 ENCOUNTER — Telehealth: Payer: Self-pay

## 2019-05-09 NOTE — Telephone Encounter (Signed)
-----   Message from Lequita Asal, MD sent at 05/09/2019  4:47 AM EST ----- Regarding: Please call patient  Hemoglobin is normal.  Ferritin has drifted down to 34.  Any symptoms?  Consider IV iron or recheck labs (CBC, ferritin) in 2-3 months.  M ----- Message ----- From: Buel Ream, [facility] [place] Sent: 05/08/2019   4:23 PM EST To: Lequita Asal, MD

## 2019-05-09 NOTE — Telephone Encounter (Signed)
Spoke with the patient to inform her that hbg is normal, ferritin has drifted down to 34. The patient did not repot any symptoms. The patient refused IV Iron and has been schedule for repeat labs in 2-3 months. The patient was understanding and agreeable.

## 2019-07-20 ENCOUNTER — Other Ambulatory Visit: Payer: Self-pay | Admitting: Hematology and Oncology

## 2019-07-20 ENCOUNTER — Other Ambulatory Visit: Payer: Self-pay | Admitting: Family Medicine

## 2019-07-20 DIAGNOSIS — I81 Portal vein thrombosis: Secondary | ICD-10-CM

## 2019-07-20 DIAGNOSIS — D6851 Activated protein C resistance: Secondary | ICD-10-CM

## 2019-07-20 DIAGNOSIS — E785 Hyperlipidemia, unspecified: Secondary | ICD-10-CM

## 2019-07-20 NOTE — Telephone Encounter (Signed)
Requested Prescriptions  Pending Prescriptions Disp Refills  . atorvastatin (LIPITOR) 40 MG tablet [Pharmacy Med Name: Atorvastatin Calcium 40 MG Oral Tablet] 90 tablet 1    Sig: Take 1 tablet by mouth once daily     Cardiovascular:  Antilipid - Statins Failed - 07/20/2019 10:36 AM      Failed - Triglycerides in normal range and within 360 days    Triglycerides  Date Value Ref Range Status  01/02/2019 156 (H) <150 mg/dL Final         Passed - Total Cholesterol in normal range and within 360 days    Cholesterol, Total  Date Value Ref Range Status  05/02/2015 124 100 - 199 mg/dL Final   Cholesterol  Date Value Ref Range Status  01/02/2019 130 <200 mg/dL Final         Passed - LDL in normal range and within 360 days    LDL Cholesterol (Calc)  Date Value Ref Range Status  01/02/2019 56 mg/dL (calc) Final    Comment:    Reference range: <100 . Desirable range <100 mg/dL for primary prevention;   <70 mg/dL for patients with CHD or diabetic patients  with > or = 2 CHD risk factors. Marland Kitchen LDL-C is now calculated using the Martin-Hopkins  calculation, which is a validated novel method providing  better accuracy than the Friedewald equation in the  estimation of LDL-C.  Cresenciano Genre et al. Annamaria Helling. [id number]): 2061-2068  (http://education.[employer].com/faq/FAQ164)          Passed - HDL in normal range and within 360 days    HDL  Date Value Ref Range Status  01/02/2019 51 > OR = 50 mg/dL Final  05/02/2015 68 >39 mg/dL Final         Passed - Patient is not pregnant      Passed - Valid encounter within last 12 months    Recent Outpatient Visits          2 months ago Type 2 diabetes mellitus with microalbuminuria, without long-term current use of insulin [facility])   [facility] Steele Sizer, MD   4 months ago Well adult exam   [facility] Steele Sizer, MD   6 months ago Type 2 diabetes mellitus with microalbuminuria, without  long-term current use of insulin [facility])   [facility] [place], Drue Stager, MD   1 year ago Type 2 diabetes mellitus with microalbuminuria, without long-term current use of insulin [facility])   [facility] [place], Drue Stager, MD   1 year ago Type 2 diabetes mellitus with microalbuminuria, without long-term current use of insulin [facility])   [facility] Steele Sizer, MD      Future Appointments            In 3 months Ancil Boozer, Drue Stager, MD [facility], [facility]

## 2019-07-24 LAB — HM DIABETES EYE EXAM

## 2019-07-26 ENCOUNTER — Inpatient Hospital Stay: Payer: BC Managed Care – PPO | Attending: Hematology and Oncology

## 2019-07-26 ENCOUNTER — Other Ambulatory Visit: Payer: Self-pay

## 2019-07-26 ENCOUNTER — Other Ambulatory Visit: Payer: Self-pay | Admitting: Hematology and Oncology

## 2019-07-26 DIAGNOSIS — D509 Iron deficiency anemia, unspecified: Secondary | ICD-10-CM | POA: Diagnosis present

## 2019-07-26 LAB — CBC
HCT: 40.2 % (ref 36.0–46.0)
Hemoglobin: 13 g/dL (ref 12.0–15.0)
MCH: 27.8 pg (ref 26.0–34.0)
MCHC: 32.3 g/dL (ref 30.0–36.0)
MCV: 85.9 fL (ref 80.0–100.0)
Platelets: 230 10*3/uL (ref 150–400)
RBC: 4.68 MIL/uL (ref 3.87–5.11)
RDW: 14.5 % (ref 11.5–15.5)
WBC: 8.4 10*3/uL (ref 4.0–10.5)
nRBC: 0 % (ref 0.0–0.2)

## 2019-07-26 LAB — IRON AND TIBC
Iron: 48 ug/dL (ref 28–170)
Saturation Ratios: 13 % (ref 10.4–31.8)
TIBC: 370 ug/dL (ref 250–450)
UIBC: 322 ug/dL

## 2019-07-26 LAB — FERRITIN: Ferritin: 15 ng/mL (ref 11–307)

## 2019-08-16 ENCOUNTER — Other Ambulatory Visit: Payer: Self-pay | Admitting: Family Medicine

## 2019-08-16 DIAGNOSIS — J302 Other seasonal allergic rhinitis: Secondary | ICD-10-CM

## 2019-08-16 DIAGNOSIS — I10 Essential (primary) hypertension: Secondary | ICD-10-CM

## 2019-10-17 ENCOUNTER — Other Ambulatory Visit: Payer: Self-pay | Admitting: Hematology and Oncology

## 2019-10-17 DIAGNOSIS — I81 Portal vein thrombosis: Secondary | ICD-10-CM

## 2019-10-17 DIAGNOSIS — D6851 Activated protein C resistance: Secondary | ICD-10-CM

## 2019-10-23 ENCOUNTER — Other Ambulatory Visit: Payer: Self-pay | Admitting: Family Medicine

## 2019-10-23 DIAGNOSIS — E1129 Type 2 diabetes mellitus with other diabetic kidney complication: Secondary | ICD-10-CM

## 2019-10-24 ENCOUNTER — Other Ambulatory Visit: Payer: Self-pay | Admitting: Family Medicine

## 2019-11-03 NOTE — Progress Notes (Signed)
Name: Emily Pitts   MRN: [id number]    DOB: 08/06/1955   Date:11/06/2019       Progress Note  Subjective  Chief Complaint  Chief Complaint  Patient presents with  . Follow-up  . 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   She has not been checking her glucose lately.She deniesblurred vision, polyphagia, polyuria or polydipsia. Chronic nocturia once per night - she drinks a lot of water in the evenings. She is on ARB , she has numbness on both feet but getting worse over the past 4 months, we will check B12 since she takes metformin .  Obesity:she states not as active because of the heat, puppy does not like the heat, gained a few pounds, also not eating as healthy lately   HTN: she is onBenicar hctzshe is taking one pill daily,no chest pain, palpitation, SOB or dizziness. BP is at goal, continue medications   Hyperlipidemia: she is now on Atorvastatin and denies side effects .She will return for labs   GERD: she states only takes prn medication now and rarely has problems, even though not eating healthy lately   Perennial AR with seasonal variation: She is on singulair, xyzal and Astelin, still has rhinorrhea occasionally itchy eyes, but doing a little better.   Portal Vein Thrombosis: seen hematologistDr. Corcooran, taking Xarelto, denies any bleeding. She has elevated factor V leiden.She was diagnosed with iron deficiency anemia , she has iron infusion and last labs back to normal, she has a follow up visit today   Constipation:  [place]usually a 4 with medication.Still taking miralax only prn. Unchanged     Patient Active Problem List   Diagnosis Date Noted  . Iron deficiency anemia 02/14/2018  . 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  Long term current use of anticoagulant therapy 01/05/2016  . Factor 5 Leiden mutation, heterozygous ([place]) 05/02/2015  . Portal vein thrombosis 11/07/2014  . History of sepsis 10/10/2014  . Abnormal electrocardiogram 10/08/2014  . Nonspecific abnormal electromyogram (EMG) 10/08/2014  . Benign essential HTN 10/08/2014  . Diabetes mellitus with renal manifestation ([place]) 10/08/2014  . Dyslipidemia 10/08/2014  . LBP (low back pain) 10/08/2014  . Gastro-esophageal reflux disease without esophagitis 10/08/2014  . Microalbuminuria 10/08/2014  . Disturbance of skin sensation 10/08/2014  . Obesity (BMI 30-39.9) 10/08/2014  . Perennial allergic rhinitis with seasonal variation 10/08/2014  . Plantar fasciitis 10/08/2014  . Postablative ovarian failure 10/08/2014  . Menopausal symptom 10/08/2014  . Vitamin D deficiency 10/08/2014    Past Surgical History:  Procedure Laterality Date  . ABDOMINAL HYSTERECTOMY  2010  . APPENDECTOMY  1966  . COLONOSCOPY  06-07-13   Dr Bary Castilla  . DILATION AND CURETTAGE OF UTERUS    . OOPHORECTOMY      Family History  Problem Relation Age of Onset  . Diabetes Mother   . COPD Mother   . Diabetes Father   . Hypertension Father   . Cancer Father        Kidney and Prostate  . CAD Father   . Diabetes Brother        [employer]  . Kidney disease Brother        Tumor removed  . Breast cancer Neg  Hx     Social History   Tobacco Use  . Smoking status: Never Smoker  . Smokeless tobacco: Never Used  Substance Use Topics  . Alcohol use: No    Alcohol/week: 0.0 standard drinks     Current Outpatient Medications:  .  ascorbic acid (VITAMIN C) 500 MG tablet, , Disp: , Rfl:  .  aspirin EC 81 MG tablet, Take 81 mg by mouth daily., Disp: , Rfl:  .  atorvastatin (LIPITOR) 40 MG tablet, Take 1 tablet by mouth once daily, Disp: 90 tablet, Rfl: 1 .  azelastine (ASTELIN) 0.1 % nasal spray, Place 2 sprays into both nostrils 2 (two) times daily. Use in each  nostril as directed, Disp: 30 mL, Rfl: 2 .  chlorzoxazone (PARAFON) 500 MG tablet, Take 1 tablet (500 mg total) by mouth 3 (three) times daily as needed., Disp: 30 tablet, Rfl: 0 .  cholecalciferol (VITAMIN D) 1000 units tablet, Take 500 Units by mouth daily. , Disp: , Rfl:  .  diclofenac sodium (VOLTAREN) 1 % GEL, Apply 4 g topically 4 (four) times daily. (Patient taking differently: Apply 4 g topically as needed. ), Disp: 100 g, Rfl: 1 .  fluticasone (FLONASE) 50 MCG/ACT nasal spray, USE 2 SPRAY(S) IN EACH NOSTRIL AS NEEDED, Disp: 48 g, Rfl: 0 .  Garlic 6761 MG CAPS, , Disp: , Rfl:  .  glucose blood (ONE TOUCH ULTRA TEST) test strip, USE AS DIRECTED, Disp: 100 each, Rfl: 12 .  levocetirizine (XYZAL) 5 MG tablet, Take 1 tablet by mouth once daily, Disp: 90 tablet, Rfl: 1 .  MAGNESIUM OXIDE PO, Take 500 mg by mouth daily. , Disp: , Rfl:  .  Menthol, Topical Analgesic, 4 % GEL, Apply 2 g topically 4 (four) times daily., Disp: 1 Tube, Rfl: 1 .  metFORMIN (GLUCOPHAGE-XR) 750 MG 24 hr tablet, Take 2 tablets (1,500 mg total) by mouth daily with breakfast., Disp: 180 tablet, Rfl: 0 .  montelukast (SINGULAIR) 10 MG tablet, Take 1 tablet (10 mg total) by mouth at bedtime., Disp: 90 tablet, Rfl: 1 .  olmesartan-hydrochlorothiazide (BENICAR HCT) 20-12.5 MG tablet, Take 1 tablet by mouth daily., Disp: 90 tablet, Rfl: 1 .  polyethylene glycol powder (GLYCOLAX/MIRALAX) powder, Take 17 g by mouth 2 (two) times daily. (Patient taking differently: Take 17 g by mouth as needed. ), Disp: 3350 g, Rfl: 1 .  tiZANidine (ZANAFLEX) 2 MG tablet, Take 1 tablet (2 mg total) by mouth every 6 (six) hours as needed for muscle spasms., Disp: 30 tablet, Rfl: 0 .  Turmeric 500 MG TABS, Take 1 tablet by mouth daily., Disp: , Rfl:  .  XARELTO 20 MG TABS tablet, TAKE 1 TABLET BY MOUTH ONCE DAILY WITH SUPPER, Disp: 90 tablet, Rfl: 0  No Known Allergies  I personally reviewed active problem list, medication list, allergies, family  history, social history, health maintenance with the patient/caregiver today.   ROS  Constitutional: Negative for fever or weight change.  Respiratory: Negative for cough and shortness of breath.   Cardiovascular: Negative for chest pain or palpitations.  Gastrointestinal: Negative for abdominal pain, no bowel changes.  Musculoskeletal: Negative for gait problem or joint swelling.  Skin: Negative for rash.  Neurological: Negative for dizziness or headache.  No other specific complaints in a complete review of systems (except as listed in HPI above).  Objective  Vitals:   11/06/19 1322  BP: 130/70  Pulse: 100  Resp: 18  Temp: 98.1 F (36.7 C)  TempSrc: Oral  SpO2: 98%  Weight: 232 lb 11.2 oz (105.6 kg)  Height: 5\' 9"  (1.753 m)    Body mass index is 34.36 kg/m.  Physical Exam  Constitutional: Patient appears well-developed and well-nourished. Obese  No distress.  HEENT: head atraumatic, normocephalic, pupils equal and reactive to light,  neck supple,  Cardiovascular: Normal rate, regular rhythm and normal heart sounds.  No murmur heard. No BLE edema. Pulmonary/Chest: Effort normal and breath sounds normal. No respiratory distress. Abdominal: Soft.  There is no tenderness. Skin: erythematous area about quarter in size or larger on right lower leg, rough to touch Psychiatric: Patient has a normal mood and affect. behavior is normal. Judgment and thought content normal.  Recent Results (from the past 2160 hour(s))  POCT HgB A1C     Status: Abnormal   Collection Time: 11/06/19  1:27 PM  Result Value Ref Range   Hemoglobin A1C 6.8 (A) 4.0 - 5.6 %   HbA1c POC (<> result, manual entry)     HbA1c, POC (prediabetic range)     HbA1c, POC (controlled diabetic range)      Diabetic Foot Exam: Diabetic Foot Exam - Simple   Simple Foot Form Diabetic Foot exam was performed with the following findings: Yes 11/06/2019  1:47 PM  Visual Inspection No deformities, no ulcerations, no  other skin breakdown bilaterally: Yes Sensation Testing See comments: Yes Pulse Check Posterior Tibialis and Dorsalis pulse intact bilaterally: Yes Comments Failed monofilament on right foot ( ball and first toe) , left foot could not feel 3rd toe       PHQ2/9: Depression screen [facility] 2/9 11/06/2019 05/05/2019 05/05/2019 03/01/2019 03/01/2019  Decreased Interest 0 0 0 0 0  Down, Depressed, Hopeless 0 0 0 0 0  PHQ - 2 Score 0 0 0 0 0  Altered sleeping 3 3 0 0 0  Tired, decreased energy 2 0 0 0 0  Change in appetite 2 0 0 0 0  Feeling bad or failure about yourself  0 0 0 0 0  Trouble concentrating 0 0 0 0 0  Moving slowly or fidgety/restless 0 0 0 0 0  Suicidal thoughts 0 0 0 0 0  PHQ-9 Score 7 3 0 0 0  Difficult doing work/chores Not difficult at all Not difficult at all Not difficult at all - -  Some recent data might be hidden    phq 9 is positive   Fall Risk: Fall Risk  11/06/2019 05/05/2019 03/01/2019 01/02/2019 07/12/2018  Falls in the past year? 0 0 0 0 0  Number falls in past yr: 0 0 0 0 0  Injury with Fall? 0 0 0 0 0     Functional Status Survey: Is the patient deaf or have difficulty hearing?: No Does the patient have difficulty seeing, even when wearing glasses/contacts?: No Does the patient have difficulty concentrating, remembering, or making decisions?: Yes Does the patient have difficulty walking or climbing stairs?: No Does the patient have difficulty dressing or bathing?: No Does the patient have difficulty doing errands alone such as visiting a doctor's office or shopping?: No    Assessment & Plan    1. Benign essential HTN  - COMPLETE METABOLIC PANEL WITH GFR - olmesartan-hydrochlorothiazide (BENICAR HCT) 20-12.5 MG tablet; Take 1 tablet by mouth daily.  Dispense: 90 tablet; Refill: 1  2. Type 2 diabetes mellitus with microalbuminuria, without long-term current use of insulin (HCC)  - POCT HgB A1C - Microalbumin, urine - HM Diabetes Foot Exam -  Microalbumin /  creatinine urine ratio - metFORMIN (GLUCOPHAGE-XR) 750 MG 24 hr tablet; Take 2 tablets (1,500 mg total) by mouth daily with breakfast.  Dispense: 180 tablet; Refill: 0  3. Microalbuminuria  - Microalbumin, urine  4. Obesity (BMI 30-39.9)  Discussed with the patient the risk posed by an increased BMI. Discussed importance of portion control, calorie counting and at least 150 minutes of physical activity weekly. Avoid sweet beverages and drink more water. Eat at least 6 servings of fruit and vegetables daily   5. Vitamin D deficiency  Continue otc vitamin d   6. Iron deficiency anemia, unspecified iron deficiency anemia type  She has follow up with Dr. Mike Gip today   7. Factor 5 Leiden mutation, heterozygous ([place])  On Xarelto   8. Portal vein thrombosis   10. Perennial allergic rhinitis with seasonal variation  - montelukast (SINGULAIR) 10 MG tablet; Take 1 tablet (10 mg total) by mouth at bedtime.  Dispense: 90 tablet; Refill: 1  11. High risk medication use  - B12 and Folate Panel  12. Dyslipidemia  - Lipid panel  13. Hypertension associated with diabetes ([place])   14. Skin lesion  - Ambulatory referral to Dermatology  15. Need for 23-polyvalent pneumococcal polysaccharide vaccine  - Pneumococcal polysaccharide vaccine 23-valent greater than or equal to 2yo subcutaneous/IM

## 2019-11-05 NOTE — Progress Notes (Signed)
[facility]  [address] [place], [place] [address] Phone: [phone]  Fax: [phone]   Clinic Day:  11/06/2019  Referring physician: Steele Sizer, MD  Chief Complaint: Emily Pitts is a 64 y.o. female with Factor V Leiden and portal vein thrombosis who is seen for 1 year assessment on Xarelto.  HPI: The patient was last seen in the hematology clinic on 11/07/2018.  At that time, she was doing well.  Exam was stable. Hematocrit was 35.7, hemoglobin 11.5, MCV 81.3,  platelets 222,000, WBC 9,000. Sodium was 134. Ferritin was 7. She continued Xarelto.  The patient received Venofer weekly x 3 (11/15/2018 - 11/29/2018).  Screening mammogram on 02/14/2019 revealed no evidence of malignancy.  CBC and CMP followed: 01/02/2019: Hematocrit 42.3, hemoglobin 13.8, platelets 238,000, WBC 8,500. Normal CMP. 05/08/2019: Hematocrit 40.4, hemoglobin 13.1, platelets 244,000, WBC 9,500. Normal CMP. 07/26/2019: Hematocrit 40.2, hemoglobin 13.0, platelets 230,000, WBC 8,400.  Ferritin and iron studies followed: 01/02/2020: Ferritin 50. Iron saturation 17%. TIBC 363. 05/08/2019: Ferritin 34. 07/26/2019: Ferritin 15. Iron saturation 13%. TIBC 370.  During the interim, she has been doing well. She had no new medical concerns. She tolerated Venofer well, but notes that it was expensive (she paid $200 per infusion). She is still on Xarelto. She has right knee arthritis. Her muscle spasms have resolved. She denies easy bruising or bleeding.   Past Medical History:  Diagnosis Date  . Allergy   . Diabetes mellitus without complication ([place]) 2426  . Hyperlipidemia   . Hypertension   . Low back pain, episodic   . Numbness of feet   . Vitamin D deficiency     Past Surgical History:  Procedure Laterality Date  . ABDOMINAL HYSTERECTOMY  2010  . APPENDECTOMY  1966  . COLONOSCOPY  06-07-13   Dr Bary Castilla  . DILATION AND CURETTAGE OF UTERUS    . OOPHORECTOMY       Family History  Problem Relation Age of Onset  . Diabetes Mother   . COPD Mother   . Diabetes Father   . Hypertension Father   . Cancer Father        Kidney and Prostate  . CAD Father   . Diabetes Brother        [employer]  . Kidney disease Brother        Tumor removed  . Breast cancer Neg Hx     Social History:  reports that she has never smoked. She has never used smokeless tobacco. She reports that she does not drink alcohol and does not use drugs. She does not have any children. She plans on retiring at the end of 2021. She lives in [place]. The patient is alone today.  Allergies: No Known Allergies  Current Medications: Current Outpatient Medications  Medication Sig Dispense Refill  . aspirin EC 81 MG tablet Take 81 mg by mouth daily.    Marland Kitchen atorvastatin (LIPITOR) 40 MG tablet Take 1 tablet by mouth once daily 90 tablet 1  . azelastine (ASTELIN) 0.1 % nasal spray Place 2 sprays into both nostrils 2 (two) times daily. Use in each nostril as directed 30 mL 2  . chlorzoxazone (PARAFON) 500 MG tablet Take 1 tablet (500 mg total) by mouth 3 (three) times daily as needed. 30 tablet 0  . cholecalciferol (VITAMIN D) 1000 units tablet Take 500 Units by mouth daily.     . fluticasone (FLONASE) 50 MCG/ACT nasal spray USE 2 SPRAY(S) IN EACH NOSTRIL  AS NEEDED 48 g 0  . Garlic 1610 MG CAPS     . glucose blood (ONE TOUCH ULTRA TEST) test strip USE AS DIRECTED 100 each 12  . levocetirizine (XYZAL) 5 MG tablet Take 1 tablet by mouth once daily 90 tablet 1  . MAGNESIUM OXIDE PO Take 500 mg by mouth daily.     . metFORMIN (GLUCOPHAGE-XR) 750 MG 24 hr tablet Take 2 tablets (1,500 mg total) by mouth daily with breakfast. 180 tablet 0  . montelukast (SINGULAIR) 10 MG tablet Take 1 tablet (10 mg total) by mouth at bedtime. 90 tablet 1  . tiZANidine (ZANAFLEX) 2 MG tablet Take 1 tablet (2 mg total) by mouth every 6 (six) hours as needed for muscle spasms. 30 tablet 0  . Turmeric 500 MG  TABS Take 1 tablet by mouth daily.    . vitamin B-12 (CYANOCOBALAMIN) 500 MCG tablet Take 1,000 mcg by mouth daily.    Alveda Reasons 20 MG TABS tablet TAKE 1 TABLET BY MOUTH ONCE DAILY WITH SUPPER 90 tablet 0  . ascorbic acid (VITAMIN C) 500 MG tablet  (Patient not taking: Reported on 11/06/2019)    . diclofenac sodium (VOLTAREN) 1 % GEL Apply 4 g topically 4 (four) times daily. (Patient not taking: Reported on 11/06/2019) 100 g 1  . Menthol, Topical Analgesic, 4 % GEL Apply 2 g topically 4 (four) times daily. (Patient not taking: Reported on 11/06/2019) 1 Tube 1  . olmesartan-hydrochlorothiazide (BENICAR HCT) 20-12.5 MG tablet Take 1 tablet by mouth daily. (Patient not taking: Reported on 11/06/2019) 90 tablet 1  . polyethylene glycol powder (GLYCOLAX/MIRALAX) powder Take 17 g by mouth 2 (two) times daily. (Patient not taking: Reported on 11/06/2019) 3350 g 1   No current facility-administered medications for this visit.    Review of Systems  Constitutional: Negative.  Negative for chills, diaphoresis, fever, malaise/fatigue and weight loss (up 5 lbs).       Doing well.  HENT: Negative.  Negative for congestion, ear discharge, ear pain, hearing loss, nosebleeds, sinus pain, sore throat and tinnitus.   Eyes: Negative.  Negative for blurred vision.  Respiratory: Negative.  Negative for cough, sputum production, shortness of breath and wheezing.   Cardiovascular: Negative.  Negative for chest pain, palpitations, orthopnea, leg swelling and PND.  Gastrointestinal: Negative.  Negative for abdominal pain, blood in stool, constipation, diarrhea, heartburn, melena, nausea and vomiting.       Colonoscopy 06/2013.  Genitourinary: Negative.  Negative for dysuria, frequency, hematuria and urgency.  Musculoskeletal: Positive for joint pain (right knee, arthritis). Negative for back pain, myalgias and neck pain.  Skin: Negative.  Negative for rash.  Neurological: Negative.  Negative for dizziness, tingling,  sensory change, focal weakness, weakness and headaches.  Endo/Heme/Allergies: Does not bruise/bleed easily.       Diabetes.  Psychiatric/Behavioral: Negative.  Negative for depression and memory loss. The patient is not nervous/anxious and does not have insomnia.   All other systems reviewed and are negative.  Performance status (ECOG): 0  Vitals Blood pressure (!) 160/80, pulse 84, resp. rate 18, weight 232 lb 5.8 oz (105.4 kg), SpO2 98 %.   Physical Exam Vitals and nursing note reviewed.  Constitutional:      General: She is not in acute distress.    Appearance: She is well-developed. She is not diaphoretic.  HENT:     Head: Normocephalic and atraumatic.     Comments: Curly strawberry blonde hair.    Mouth/Throat:  Pharynx: No oropharyngeal exudate.  Eyes:     General: No scleral icterus.    Conjunctiva/sclera: Conjunctivae normal.     Pupils: Pupils are equal, round, and reactive to light.     Comments: Brown eyes.   Neck:     Vascular: No JVD.  Cardiovascular:     Rate and Rhythm: Normal rate and regular rhythm.     Heart sounds: Normal heart sounds. No murmur heard.   Pulmonary:     Effort: Pulmonary effort is normal. No respiratory distress.     Breath sounds: Normal breath sounds. No wheezing or rales.  Abdominal:     General: Bowel sounds are normal. There is no distension.     Palpations: Abdomen is soft. There is no hepatomegaly, splenomegaly or mass.     Tenderness: There is no abdominal tenderness. There is no guarding or rebound.  Musculoskeletal:        General: Normal range of motion.     Cervical back: Normal range of motion and neck supple.  Lymphadenopathy:     Head:     Right side of head: No preauricular, posterior auricular or occipital adenopathy.     Left side of head: No preauricular, posterior auricular or occipital adenopathy.     Cervical: No cervical adenopathy.     Upper Body:     Right upper body: No supraclavicular or axillary  adenopathy.     Left upper body: No supraclavicular or axillary adenopathy.     Lower Body: No right inguinal adenopathy. No left inguinal adenopathy.  Skin:    General: Skin is warm and dry.  Neurological:     Mental Status: She is oriented to person, place, and time.  Psychiatric:        Behavior: Behavior normal.        Thought Content: Thought content normal.        Judgment: Judgment normal.    Appointment on 11/06/2019  Component Date Value Ref Range Status  . WBC 11/06/2019 9.3  4.0 - 10.5 K/uL Final  . RBC 11/06/2019 4.69  3.87 - 5.11 MIL/uL Final  . Hemoglobin 11/06/2019 13.1  12.0 - 15.0 g/dL Final  . HCT 11/06/2019 39.6  36 - 46 % Final  . MCV 11/06/2019 84.4  80.0 - 100.0 fL Final  . MCH 11/06/2019 27.9  26.0 - 34.0 pg Final  . MCHC 11/06/2019 33.1  30.0 - 36.0 g/dL Final  . RDW 11/06/2019 13.9  11.5 - 15.5 % Final  . Platelets 11/06/2019 245  150 - 400 K/uL Final  . nRBC 11/06/2019 0.0  0.0 - 0.2 % Final  . Neutrophils Relative % 11/06/2019 66  % Final  . Neutro Abs 11/06/2019 6.1  1.7 - 7.7 K/uL Final  . Lymphocytes Relative 11/06/2019 25  % Final  . Lymphs Abs 11/06/2019 2.3  0.7 - 4.0 K/uL Final  . Monocytes Relative 11/06/2019 8  % Final  . Monocytes Absolute 11/06/2019 0.8  0 - 1 K/uL Final  . Eosinophils Relative 11/06/2019 1  % Final  . Eosinophils Absolute 11/06/2019 0.1  0 - 0 K/uL Final  . Basophils Relative 11/06/2019 0  % Final  . Basophils Absolute 11/06/2019 0.0  0 - 0 K/uL Final  . Immature Granulocytes 11/06/2019 0  % Final  . Abs Immature Granulocytes 11/06/2019 0.04  0.00 - 0.07 K/uL Final   Performed at [facility], [address], [place], [place] [address]  . Sodium 11/06/2019 138  135 - 145 mmol/L Final  . Potassium 11/06/2019 4.7  3.5 - 5.1 mmol/L Final  . Chloride 11/06/2019 100  98 - 111 mmol/L Final  . CO2 11/06/2019 27  22 - 32 mmol/L Final  . Glucose, Bld 11/06/2019 102* 70 - 99 mg/dL Final   Glucose reference range  applies only to samples taken after fasting for at least 8 hours.  . BUN 11/06/2019 12  8 - 23 mg/dL Final  . Creatinine, Ser 11/06/2019 0.70  0.44 - 1.00 mg/dL Final  . Calcium 11/06/2019 9.8  8.9 - 10.3 mg/dL Final  . Total Protein 11/06/2019 7.7  6.5 - 8.1 g/dL Final  . Albumin 11/06/2019 4.3  3.5 - 5.0 g/dL Final  . AST 11/06/2019 24  15 - 41 U/L Final  . ALT 11/06/2019 35  0 - 44 U/L Final  . Alkaline Phosphatase 11/06/2019 81  38 - 126 U/L Final  . Total Bilirubin 11/06/2019 0.7  0.3 - 1.2 mg/dL Final  . GFR calc non Af Amer 11/06/2019 >60  >60 mL/min Final  . GFR calc Af Amer 11/06/2019 >60  >60 mL/min Final  . Anion gap 11/06/2019 11  5 - 15 Final   Performed at [facility], [address], [place], [place] [address]  Office Visit on 11/06/2019  Component Date Value Ref Range Status  . Hemoglobin A1C 11/06/2019 6.8* 4.0 - 5.6 % Final    Assessment:  Emily Pitts is a 64 y.o. female with portal vein thrombosis presenting with fever, chills, dehydration, weight loss, and abdominal pain.  Symptoms lasted for about 4 weeks.  Abdominal and pelvic CT on 11/07/2014 revealed a partially thrombosed portal veins (left > right).  The main portal vein and mesenteric veins remained patent.  Porta hepatis nodes measured 1.2 cm in short axis (stable).  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  The following studies were normal:  prothrombin gene mutation, lupus anticoagulant panel, anticardiolipin antibodies, homocysteine, protein C antigen (88%), protein C activity (111%), protein S antigen total (131%), protein S antigen free (108%), anti-thrombin III antigen (88%), anti-thrombin III functional (101%).  On 12/11/2014, protein S antigen total (95%), protein S antigen free (77%), and protein S activity (69%) were normal.  PNH by flow cytometry was negative.  JAK2 V617F and exon 12 were negative on 05/02/2015.  Additional testing  on 11/27/2014 revealed resolution of anemia.  The following studies were normal:  SPEP, free light chain ratio, 24 hour UPEP, IgG, IgA, ANA, ESR, ferritin, reticulocyte count, B12, and folate.  Serum protein electrophoresis revealed no monoclonal protein.  Kappa free light chains were 26.67, lambda free light chains 24.67 and ratio 1.08 (normal).   IgM was 247 (26-217).    She was started on Xarelto on 11/08/2014.  Plan is for long term anticoagulation given the unprovoked thrombus.  Ultrasound hepatic doppler on 05/02/2015 revealed no convincing evidence of occlusion or thrombus.  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 There was interval reconstitution of previously noted thrombosis within the peripheral branches of the right portal vein. The right and main portal veins appeared widely patent.  A discrete hepatic lesion/mass was not identified.  There was no evidence of cirrhosis. There was unchanged borderline splenomegaly without evidence of gastroesophageal varices or ascites.  There was extensive colonic diverticulosis.  She denies any prior history of thrombosis.  She has not been on birth control  pills.  She denies any family history of thrombosis.   Screening studies for malignancy were normal.  CXR on 11/05/2014 was negative.  Bilateral mammogram on 01/27/2017 demonstrated no mammographic evidence of malignancy.  Colonoscopy on 06/07/2013 revealed only diverticulosis.  She has iron deficiency. She received Venofer weekly x 3 (11/15/2018 - 11/29/2018).  Ferritin has been followed: 39 on 11/27/2014, 7 on 02/08/2018, 6 on 05/05/2018, 7 on 11/07/2018, 50 on 10, 20 08/2018, 34 on 05/08/2019, 15 on 07/26/2019, and 16 on 11/06/2019.  She does not tolerate oral iron.  Colonoscopy on 06/07/2013 revealed erythematous mucosa and diverticulosis in the sigmoid colon.  Pathology revealed mild vascular congestion in the colonic  mucosa.  The patient received the [place] COVID-19 vaccine on 05/30/2019 and 06/20/2019.  Symptomatically, she is doing well.  She denies any abdominal symptoms.  She denied any excess bruising or bleeding.  Exam is stable.  Plan: 1.  Labs today:  CBC with diff, CMP, ferritin. 2.   Portal vein thrombosis             Clinically, she is doing well on Xarelto.   Continue long-term anticoagulation. 3.   Iron deficiency  Hematocrit 39.6.  Hemoglobin 13.1.    Ferritin 16 (available after clinic).  Ferritin goal 100.  Patient denies any bleeding.  She is intolerant of oral iron.  She received Venofer in 11/2018, but because her prohibitive. 4.   RTC in 6 months for labs (CBC with diff, CMP, ferritin). 5.   RTC in 12 months for MD assessment and labs (CBC with diff, CMP, ferritin).  Addendum: Patient was contacted regarding her ferritin of 16 after clinic.  She would like to try oral iron again.    I discussed the assessment and treatment plan with the patient.  The patient was provided an opportunity to ask questions and all were answered.  The patient agreed with the plan and demonstrated an understanding of the instructions.  The patient was advised to call back if the symptoms worsen or if the condition fails to improve as anticipated.   Lequita Asal, MD  11/06/2019, 5:00 PM  I, De Burrs, am acting as Education administrator for [employer]. Mike Gip, MD, PhD.  I, Novah Goza C. Mike Gip, MD, have reviewed the above documentation for accuracy and completeness, and I agree with the above.

## 2019-11-06 ENCOUNTER — Inpatient Hospital Stay: Payer: BC Managed Care – PPO | Attending: Hematology and Oncology | Admitting: Hematology and Oncology

## 2019-11-06 ENCOUNTER — Encounter: Payer: Self-pay | Admitting: Family Medicine

## 2019-11-06 ENCOUNTER — Other Ambulatory Visit: Payer: Self-pay

## 2019-11-06 ENCOUNTER — Encounter: Payer: Self-pay | Admitting: Hematology and Oncology

## 2019-11-06 ENCOUNTER — Ambulatory Visit: Payer: BC Managed Care – PPO | Admitting: Family Medicine

## 2019-11-06 ENCOUNTER — Inpatient Hospital Stay: Payer: BC Managed Care – PPO

## 2019-11-06 VITALS — BP 130/70 | HR 100 | Temp 98.1°F | Resp 18 | Ht 69.0 in | Wt 232.7 lb

## 2019-11-06 VITALS — BP 160/80 | HR 84 | Resp 18 | Wt 232.4 lb

## 2019-11-06 DIAGNOSIS — E119 Type 2 diabetes mellitus without complications: Secondary | ICD-10-CM | POA: Insufficient documentation

## 2019-11-06 DIAGNOSIS — E669 Obesity, unspecified: Secondary | ICD-10-CM

## 2019-11-06 DIAGNOSIS — R809 Proteinuria, unspecified: Secondary | ICD-10-CM

## 2019-11-06 DIAGNOSIS — E1129 Type 2 diabetes mellitus with other diabetic kidney complication: Secondary | ICD-10-CM

## 2019-11-06 DIAGNOSIS — E559 Vitamin D deficiency, unspecified: Secondary | ICD-10-CM

## 2019-11-06 DIAGNOSIS — J3089 Other allergic rhinitis: Secondary | ICD-10-CM

## 2019-11-06 DIAGNOSIS — Z23 Encounter for immunization: Secondary | ICD-10-CM | POA: Diagnosis not present

## 2019-11-06 DIAGNOSIS — Z7901 Long term (current) use of anticoagulants: Secondary | ICD-10-CM | POA: Diagnosis not present

## 2019-11-06 DIAGNOSIS — E1159 Type 2 diabetes mellitus with other circulatory complications: Secondary | ICD-10-CM

## 2019-11-06 DIAGNOSIS — L989 Disorder of the skin and subcutaneous tissue, unspecified: Secondary | ICD-10-CM

## 2019-11-06 DIAGNOSIS — E785 Hyperlipidemia, unspecified: Secondary | ICD-10-CM

## 2019-11-06 DIAGNOSIS — Z79899 Other long term (current) drug therapy: Secondary | ICD-10-CM

## 2019-11-06 DIAGNOSIS — I81 Portal vein thrombosis: Secondary | ICD-10-CM | POA: Diagnosis present

## 2019-11-06 DIAGNOSIS — Z7984 Long term (current) use of oral hypoglycemic drugs: Secondary | ICD-10-CM | POA: Diagnosis not present

## 2019-11-06 DIAGNOSIS — D509 Iron deficiency anemia, unspecified: Secondary | ICD-10-CM | POA: Diagnosis not present

## 2019-11-06 DIAGNOSIS — E611 Iron deficiency: Secondary | ICD-10-CM | POA: Diagnosis not present

## 2019-11-06 DIAGNOSIS — I152 Hypertension secondary to endocrine disorders: Secondary | ICD-10-CM

## 2019-11-06 DIAGNOSIS — I10 Essential (primary) hypertension: Secondary | ICD-10-CM

## 2019-11-06 DIAGNOSIS — J302 Other seasonal allergic rhinitis: Secondary | ICD-10-CM

## 2019-11-06 DIAGNOSIS — Z8639 Personal history of other endocrine, nutritional and metabolic disease: Secondary | ICD-10-CM

## 2019-11-06 DIAGNOSIS — D6851 Activated protein C resistance: Secondary | ICD-10-CM

## 2019-11-06 LAB — CBC WITH DIFFERENTIAL/PLATELET
Abs Immature Granulocytes: 0.04 10*3/uL (ref 0.00–0.07)
Basophils Absolute: 0 10*3/uL (ref 0.0–0.1)
Basophils Relative: 0 %
Eosinophils Absolute: 0.1 10*3/uL (ref 0.0–0.5)
Eosinophils Relative: 1 %
HCT: 39.6 % (ref 36.0–46.0)
Hemoglobin: 13.1 g/dL (ref 12.0–15.0)
Immature Granulocytes: 0 %
Lymphocytes Relative: 25 %
Lymphs Abs: 2.3 10*3/uL (ref 0.7–4.0)
MCH: 27.9 pg (ref 26.0–34.0)
MCHC: 33.1 g/dL (ref 30.0–36.0)
MCV: 84.4 fL (ref 80.0–100.0)
Monocytes Absolute: 0.8 10*3/uL (ref 0.1–1.0)
Monocytes Relative: 8 %
Neutro Abs: 6.1 10*3/uL (ref 1.7–7.7)
Neutrophils Relative %: 66 %
Platelets: 245 10*3/uL (ref 150–400)
RBC: 4.69 MIL/uL (ref 3.87–5.11)
RDW: 13.9 % (ref 11.5–15.5)
WBC: 9.3 10*3/uL (ref 4.0–10.5)
nRBC: 0 % (ref 0.0–0.2)

## 2019-11-06 LAB — POCT GLYCOSYLATED HEMOGLOBIN (HGB A1C): Hemoglobin A1C: 6.8 % — AB (ref 4.0–5.6)

## 2019-11-06 LAB — COMPREHENSIVE METABOLIC PANEL
ALT: 35 U/L (ref 0–44)
AST: 24 U/L (ref 15–41)
Albumin: 4.3 g/dL (ref 3.5–5.0)
Alkaline Phosphatase: 81 U/L (ref 38–126)
Anion gap: 11 (ref 5–15)
BUN: 12 mg/dL (ref 8–23)
CO2: 27 mmol/L (ref 22–32)
Calcium: 9.8 mg/dL (ref 8.9–10.3)
Chloride: 100 mmol/L (ref 98–111)
Creatinine, Ser: 0.7 mg/dL (ref 0.44–1.00)
GFR calc Af Amer: >60 mL/min (ref >60)
GFR calc non Af Amer: >60 mL/min (ref >60)
Glucose, Bld: 102 mg/dL — ABNORMAL HIGH (ref 70–99)
Potassium: 4.7 mmol/L (ref 3.5–5.1)
Sodium: 138 mmol/L (ref 135–145)
Total Bilirubin: 0.7 mg/dL (ref 0.3–1.2)
Total Protein: 7.7 g/dL (ref 6.5–8.1)

## 2019-11-06 LAB — FERRITIN: Ferritin: 16 ng/mL (ref 11–307)

## 2019-11-06 MED ORDER — MONTELUKAST SODIUM 10 MG PO TABS: 10.0000 mg | ORAL_TABLET | Freq: Every day | ORAL | 1 refills | Status: DC

## 2019-11-06 MED ORDER — OLMESARTAN MEDOXOMIL-HCTZ 20-12.5 MG PO TABS: 1.0000 | ORAL_TABLET | Freq: Every day | ORAL | 1 refills | Status: DC

## 2019-11-06 MED ORDER — METFORMIN HCL ER 750 MG PO TB24: 1500.0000 mg | ORAL_TABLET | Freq: Every day | ORAL | 0 refills | Status: DC

## 2019-11-06 NOTE — Progress Notes (Signed)
Patient here for oncology follow-up appointment, expresses no complaints or concerns at this time.    

## 2019-11-07 ENCOUNTER — Telehealth: Payer: Self-pay

## 2019-11-07 NOTE — Telephone Encounter (Signed)
-----   Message from Lequita Asal, MD sent at 11/07/2019 12:39 PM EDT ----- Regarding: Please call patient  Ferritin remains low.  Hemoglobin is still normal.  She was concerned about the cost of iron yesterday.  She is intolerant of oral iron.  Would she like IV iron or wait?  M ----- Message ----- From: Buel Ream, [facility] [place] Sent: 11/06/2019   3:33 PM EDT To: Lequita Asal, MD

## 2019-11-07 NOTE — Telephone Encounter (Signed)
Patient aware. She states that she would like to hold off on the IV iron due to cost. She said that she will try iron tablets.

## 2019-11-28 ENCOUNTER — Ambulatory Visit (INDEPENDENT_AMBULATORY_CARE_PROVIDER_SITE_OTHER): Payer: BC Managed Care – PPO

## 2019-11-28 ENCOUNTER — Other Ambulatory Visit: Payer: Self-pay

## 2019-11-28 DIAGNOSIS — Z23 Encounter for immunization: Secondary | ICD-10-CM | POA: Diagnosis not present

## 2019-11-29 LAB — COMPLETE METABOLIC PANEL WITH GFR
AG Ratio: 1.4 (calc) (ref 1.0–2.5)
ALT: 24 U/L (ref 6–29)
AST: 19 U/L (ref 10–35)
Albumin: 4.1 g/dL (ref 3.6–5.1)
Alkaline phosphatase (APISO): 83 U/L (ref 37–153)
BUN: 9 mg/dL (ref 7–25)
CO2: 28 mmol/L (ref 20–32)
Calcium: 9.6 mg/dL (ref 8.6–10.4)
Chloride: 99 mmol/L (ref 98–110)
Creat: 0.69 mg/dL (ref 0.50–0.99)
GFR, Est African American: 106 mL/min/{1.73_m2} (ref >=60)
GFR, Est Non African American: 91 mL/min/{1.73_m2} (ref >=60)
Globulin: 2.9 g/dL (calc) (ref 1.9–3.7)
Glucose, Bld: 117 mg/dL — ABNORMAL HIGH (ref 65–99)
Potassium: 4.4 mmol/L (ref 3.5–5.3)
Sodium: 138 mmol/L (ref 135–146)
Total Bilirubin: 0.6 mg/dL (ref 0.2–1.2)
Total Protein: 7 g/dL (ref 6.1–8.1)

## 2019-11-29 LAB — LIPID PANEL
Cholesterol: 116 mg/dL (ref <200)
HDL: 45 mg/dL — ABNORMAL LOW (ref >=50)
LDL Cholesterol (Calc): 49 mg/dL (calc)
Non-HDL Cholesterol (Calc): 71 mg/dL (calc) (ref <130)
Total CHOL/HDL Ratio: 2.6 (calc) (ref <5.0)
Triglycerides: 137 mg/dL (ref <150)

## 2019-11-29 LAB — MICROALBUMIN / CREATININE URINE RATIO
Creatinine, Urine: 18 mg/dL — ABNORMAL LOW (ref 20–275)
Microalb, Ur: <0.2 mg/dL

## 2019-11-29 LAB — B12 AND FOLATE PANEL
Folate: 15.2 ng/mL
Vitamin B-12: 328 pg/mL (ref 200–1100)

## 2020-01-04 ENCOUNTER — Other Ambulatory Visit: Payer: Self-pay

## 2020-01-04 ENCOUNTER — Ambulatory Visit (INDEPENDENT_AMBULATORY_CARE_PROVIDER_SITE_OTHER): Payer: BC Managed Care – PPO | Admitting: Dermatology

## 2020-01-04 DIAGNOSIS — L578 Other skin changes due to chronic exposure to nonionizing radiation: Secondary | ICD-10-CM | POA: Diagnosis not present

## 2020-01-04 DIAGNOSIS — L814 Other melanin hyperpigmentation: Secondary | ICD-10-CM

## 2020-01-04 DIAGNOSIS — L821 Other seborrheic keratosis: Secondary | ICD-10-CM

## 2020-01-04 DIAGNOSIS — L570 Actinic keratosis: Secondary | ICD-10-CM

## 2020-01-04 NOTE — Patient Instructions (Addendum)
Recommend taking Heliocare sun protection supplement daily in sunny weather for additional sun protection. For maximum protection on the sunniest days, you can take up to 2 capsules of regular Heliocare OR take 1 capsule of Heliocare Ultra. For prolonged exposure (such as a full day in the sun), you can repeat your dose of the supplement 4 hours after your first dose. Heliocare can be purchased at [facility] or at [email or web address].    Recommend daily broad spectrum sunscreen SPF 30+ to sun-exposed areas, reapply every 2 hours as needed. Call for new or changing lesions.

## 2020-01-04 NOTE — Progress Notes (Signed)
   New Patient Visit  Subjective  Emily Pitts is a 64 y.o. female who presents for the following: Spot at cheek and spots on legs  She notes some raised spots on her right leg she would like evaluated. They are asymptomatic and she has not noticed any changes. They have not been treated.   She also has a spot on her left cheek there for awhile.   Objective  Well appearing patient in no apparent distress; mood and affect are within normal limits.  A focused examination was performed including face, neck, and legs. Relevant physical exam findings are noted in the Assessment and Plan.  Objective  left medial cheek: Erythematous thin papules/macules with gritty scale.   Assessment & Plan  AK (actinic keratosis) left medial cheek  Discussed this is a precancerous condition and a small portion of these will progress to squamous cell carcinoma of the skin.  Discussed cryotherapy vs topical (5-fluorouracil/calcipotriene) treatment.   Discussed risks of blister formation, small wound, skin dyspigmentation, or rare scar following cryotherapy.    Discussed side effects of cream causing the skin to become bright red, crusty, and irritated.  She will return for cryotherapy at follow-up.   Seborrheic Keratoses - Stuck-on, waxy, tan-brown papules and plaques at legs including right shin - Discussed benign etiology and prognosis. - Observe - Call for any changes  Lentigines - Scattered tan macules - Discussed due to sun exposure - Benign, observe - Call for any changes  Actinic Damage - diffuse scaly erythematous macules with underlying dyspigmentation - Recommend daily broad spectrum sunscreen SPF 30+ to sun-exposed areas, reapply every 2 hours as needed.  - Call for new or changing lesions.   Return in about 6 weeks (around 02/15/2020) for AK f/u.   I, Harriett Sine, CMA, am acting as scribe for Forest Gleason, MD.  Documentation: I have reviewed the above documentation for  accuracy and completeness, and I agree with the above.  Forest Gleason, MD

## 2020-01-05 ENCOUNTER — Encounter: Payer: Self-pay | Admitting: Dermatology

## 2020-01-17 ENCOUNTER — Other Ambulatory Visit: Payer: Self-pay | Admitting: Hematology and Oncology

## 2020-01-17 DIAGNOSIS — I81 Portal vein thrombosis: Secondary | ICD-10-CM

## 2020-01-17 DIAGNOSIS — D6851 Activated protein C resistance: Secondary | ICD-10-CM

## 2020-01-24 ENCOUNTER — Other Ambulatory Visit: Payer: Self-pay | Admitting: Family Medicine

## 2020-01-24 DIAGNOSIS — E785 Hyperlipidemia, unspecified: Secondary | ICD-10-CM

## 2020-01-30 ENCOUNTER — Other Ambulatory Visit: Payer: Self-pay | Admitting: Family Medicine

## 2020-01-30 DIAGNOSIS — Z1231 Encounter for screening mammogram for malignant neoplasm of breast: Secondary | ICD-10-CM

## 2020-02-22 ENCOUNTER — Ambulatory Visit: Payer: BC Managed Care – PPO | Admitting: Dermatology

## 2020-02-22 ENCOUNTER — Other Ambulatory Visit: Payer: Self-pay

## 2020-02-22 DIAGNOSIS — L820 Inflamed seborrheic keratosis: Secondary | ICD-10-CM

## 2020-02-22 DIAGNOSIS — L570 Actinic keratosis: Secondary | ICD-10-CM

## 2020-02-22 NOTE — Progress Notes (Signed)
   Follow-Up Visit   Subjective  Emily Pitts is a 64 y.o. female who presents for the following: Follow-up (Patient here today to have AK at left medial checked treated. ).  She also notes a spot at the neck that is very inflamed and irritated.   The following portions of the chart were reviewed this encounter and updated as appropriate:   Tobacco  Allergies  Meds  Problems  Med Hx  Surg Hx  Fam Hx      Review of Systems:  No other skin or systemic complaints except as noted in HPI or Assessment and Plan.  Objective  Well appearing patient in no apparent distress; mood and affect are within normal limits.  A focused examination was performed including face, neck. Relevant physical exam findings are noted in the Assessment and Plan.  Objective  Left Neck: Erythematous keratotic or waxy stuck-on papule or plaque.   Objective  left medial cheek: Erythematous thin papules/macules with gritty scale.    Assessment & Plan  Inflamed seborrheic keratosis Left Neck  Prior to procedure, discussed risks of blister formation, small wound, skin dyspigmentation, or rare scar following cryotherapy.    Destruction of lesion - Left Neck Complexity: simple   Destruction method: cryotherapy   Informed consent: discussed and consent obtained   Lesion destroyed using liquid nitrogen: Yes   Cryotherapy cycles:  2 Outcome: patient tolerated procedure well with no complications   Post-procedure details: wound care instructions given    AK (actinic keratosis) left medial cheek  Prior to procedure, discussed risks of blister formation, small wound, skin dyspigmentation, or rare scar following cryotherapy.    Destruction of lesion - left medial cheek Complexity: simple   Destruction method: cryotherapy   Informed consent: discussed and consent obtained   Lesion destroyed using liquid nitrogen: Yes   Cryotherapy cycles:  2 Outcome: patient tolerated procedure well with no  complications   Post-procedure details: wound care instructions given    Return for TBSE as scheduled.  Graciella Belton, RMA, am acting as scribe for Forest Gleason, MD .  Documentation: I have reviewed the above documentation for accuracy and completeness, and I agree with the above.  Forest Gleason, MD

## 2020-02-22 NOTE — Patient Instructions (Signed)
Cryotherapy Aftercare  . Wash gently with soap and water everyday.   . Apply Vaseline and Band-Aid daily until healed.  Prior to procedure, discussed risks of blister formation, small wound, skin dyspigmentation, or rare scar following cryotherapy.   

## 2020-02-27 ENCOUNTER — Encounter: Payer: Self-pay | Admitting: Dermatology

## 2020-02-27 NOTE — Progress Notes (Signed)
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 She deniesblurred vision, polyphagia, polyuria or polydipsia. Chronic nocturia once per night - she drinks a lot of water in the evenings. She is on ARB , she has numbness on both feet , last B12 towards low end of normal, advised otc supplementation   Obesity:she retired, has been walking her dog and weight is trending down   HTN: she is onBenicar hctzshe is taking one pill daily,no chest pain, palpitation, SOB or dizzinessDoing well    Hyperlipidemia: she is now on Atorvastatin and denies side effects .LDL is at goal, good cholesterol was a little low, discussed increasing physical activity, tree nuts and fish   GERD: she states only takes prn Tums now and rarely has problems. Unchanged   Perennial AR with seasonal variation: She is on singulair, xyzal and Astelin, she states always has rhinorrhea but currently no itchy eyes   Portal Vein Thrombosis: seen hematologistDr. Corcooran, taking Xarelto, denies any bleeding. She has  factor V leiden .She was diagnosed with iron deficiency anemia of unknown cause, but HCT has been stable, no longer infusions but taking otc supplementation .Discussed having EGD and colonoscopy and she will think about it   Constipation:  [place] a 4 with medication.Still taking miralax only prn. Unchanged   Patient Active Problem List   Diagnosis Date Noted  . Iron deficiency anemia 02/14/2018  . 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 Long term current use of anticoagulant therapy 01/05/2016  . Factor 5 Leiden mutation, heterozygous ([place]) 05/02/2015  . Portal vein  thrombosis 11/07/2014  . History of sepsis 10/10/2014  . Abnormal electrocardiogram 10/08/2014  . Nonspecific abnormal electromyogram (EMG) 10/08/2014  . Benign essential HTN 10/08/2014  . Diabetes mellitus with renal manifestation ([place]) 10/08/2014  . Dyslipidemia 10/08/2014  . LBP (low back pain) 10/08/2014  . Gastro-esophageal reflux disease without esophagitis 10/08/2014  . Microalbuminuria 10/08/2014  . Disturbance of skin sensation 10/08/2014  . Obesity (BMI 30-39.9) 10/08/2014  . Perennial allergic rhinitis with seasonal variation 10/08/2014  . Plantar fasciitis 10/08/2014  . Postablative ovarian failure 10/08/2014  . Menopausal symptom 10/08/2014  . Vitamin D deficiency 10/08/2014    Past Surgical History:  Procedure Laterality Date  . ABDOMINAL HYSTERECTOMY  2010  . APPENDECTOMY  1966  . COLONOSCOPY  06-07-13   Dr Bary Castilla  . DILATION AND CURETTAGE OF UTERUS    . OOPHORECTOMY      Family History  Problem Relation Age of Onset  . Diabetes Mother   . COPD Mother   . Diabetes Father   . Hypertension Father   . Cancer Father        Kidney and Prostate  . CAD Father   . Diabetes Brother        [employer]  . Kidney disease Brother        Tumor removed  . Breast cancer Neg Hx     Social History   Tobacco Use  . Smoking status: Never Smoker  . Smokeless tobacco: Never Used  Substance Use Topics  . Alcohol use: No    Alcohol/week:  0.0 standard drinks     Current Outpatient Medications:  .  aspirin EC 81 MG tablet, Take 81 mg by mouth daily., Disp: , Rfl:  .  azelastine (ASTELIN) 0.1 % nasal spray, Place 2 sprays into both nostrils 2 (two) times daily. Use in each nostril as directed, Disp: 30 mL, Rfl: 2 .  chlorzoxazone (PARAFON) 500 MG tablet, Take 1 tablet (500 mg total) by mouth 3 (three) times daily as needed., Disp: 30 tablet, Rfl: 0 .  cholecalciferol (VITAMIN D) 1000 units tablet, Take 500 Units by mouth daily. , Disp: , Rfl:  .  fluticasone  (FLONASE) 50 MCG/ACT nasal spray, USE 2 SPRAY(S) IN EACH NOSTRIL AS NEEDED, Disp: 48 g, Rfl: 0 .  glucose blood (ONE TOUCH ULTRA TEST) test strip, USE AS DIRECTED, Disp: 100 each, Rfl: 12 .  Iron-FA-B Cmp-C-Biot-Probiotic (FUSION PLUS PO), Take 1 tablet by mouth daily. Vitamin with iron, Disp: , Rfl:  .  MAGNESIUM OXIDE PO, Take 500 mg by mouth daily. , Disp: , Rfl:  .  Menthol, Topical Analgesic, 4 % GEL, Apply 2 g topically 4 (four) times daily., Disp: 1 Tube, Rfl: 1 .  olmesartan-hydrochlorothiazide (BENICAR HCT) 20-12.5 MG tablet, Take 1 tablet by mouth daily., Disp: 90 tablet, Rfl: 1 .  polyethylene glycol powder (GLYCOLAX/MIRALAX) powder, Take 17 g by mouth 2 (two) times daily., Disp: 3350 g, Rfl: 1 .  tiZANidine (ZANAFLEX) 2 MG tablet, Take 1 tablet (2 mg total) by mouth every 6 (six) hours as needed for muscle spasms., Disp: 30 tablet, Rfl: 0 .  Turmeric 500 MG TABS, Take 1 tablet by mouth daily., Disp: , Rfl:  .  XARELTO 20 MG TABS tablet, TAKE 1 TABLET BY MOUTH ONCE DAILY WITH SUPPER, Disp: 90 tablet, Rfl: 0 .  atorvastatin (LIPITOR) 40 MG tablet, Take 1 tablet (40 mg total) by mouth daily., Disp: 90 tablet, Rfl: 1 .  diclofenac sodium (VOLTAREN) 1 % GEL, Apply 4 g topically 4 (four) times daily. (Patient not taking: Reported on 02/29/2020), Disp: 100 g, Rfl: 1 .  levocetirizine (XYZAL) 5 MG tablet, Take 1 tablet (5 mg total) by mouth daily., Disp: 90 tablet, Rfl: 1 .  metFORMIN (GLUCOPHAGE-XR) 750 MG 24 hr tablet, Take 2 tablets (1,500 mg total) by mouth daily with breakfast., Disp: 180 tablet, Rfl: 1 .  montelukast (SINGULAIR) 10 MG tablet, Take 1 tablet (10 mg total) by mouth at bedtime., Disp: 90 tablet, Rfl: 1 .  vitamin B-12 (CYANOCOBALAMIN) 500 MCG tablet, Take 1,000 mcg by mouth daily. (Patient not taking: Reported on 02/29/2020), Disp: , Rfl:   No Known Allergies  I personally reviewed active problem list, medication list, allergies, family history, social history, health  maintenance, notes from last encounter with the patient/caregiver today.   ROS  Constitutional: Negative for fever or weight change.  Respiratory: Negative for cough and shortness of breath.   Cardiovascular: Negative for chest pain or palpitations.  Gastrointestinal: Negative for abdominal pain, no bowel changes.  Musculoskeletal: Negative for gait problem or joint swelling.  Skin: Negative for rash.  Neurological: Negative for dizziness or headache.  No other specific complaints in a complete review of systems (except as listed in HPI above).  Objective  Vitals:   02/29/20 1007  BP: 120/68  Pulse: 97  Resp: 18  Temp: 98 F (36.7 C)  TempSrc: Oral  SpO2: 99%  Weight: 229 lb (103.9 kg)  Height: 5\' 9"  (1.753 m)    Body mass index is 33.82 kg/m.  Physical Exam  Constitutional: Patient appears well-developed and well-nourished. Obese  No distress.  HEENT: head atraumatic, normocephalic, pupils equal and reactive to light,  neck supple Cardiovascular: Normal rate, regular rhythm and normal heart sounds.  No murmur heard. No BLE edema. Pulmonary/Chest: Effort normal and breath sounds normal. No respiratory distress. Abdominal: Soft.  There is no tenderness. Psychiatric: Patient has a normal mood and affect. behavior is normal. Judgment and thought content normal.  Recent Results (from the past 2160 hour(s))  POCT HgB A1C     Status: Abnormal   Collection Time: 02/29/20 10:10 AM  Result Value Ref Range   Hemoglobin A1C 6.7 (A) 4.0 - 5.6 %   HbA1c POC (<> result, manual entry)     HbA1c, POC (prediabetic range)     HbA1c, POC (controlled diabetic range)        PHQ2/9: Depression screen [facility] 2/9 02/29/2020 11/06/2019 05/05/2019 05/05/2019 03/01/2019  Decreased Interest 0 0 0 0 0  Down, Depressed, Hopeless 0 0 0 0 0  PHQ - 2 Score 0 0 0 0 0  Altered sleeping - 3 3 0 0  Tired, decreased energy - 2 0 0 0  Change in appetite - 2 0 0 0  Feeling bad or failure about  yourself  - 0 0 0 0  Trouble concentrating - 0 0 0 0  Moving slowly or fidgety/restless - 0 0 0 0  Suicidal thoughts - 0 0 0 0  PHQ-9 Score - 7 3 0 0  Difficult doing work/chores - Not difficult at all Not difficult at all Not difficult at all -  Some recent data might be hidden    phq 9 is negative ]  Fall Risk: Fall Risk  02/29/2020 11/06/2019 05/05/2019 03/01/2019 01/02/2019  Falls in the past year? 0 0 0 0 0  Number falls in past yr: 0 0 0 0 0  Injury with Fall? 0 0 0 0 0   ]  Functional Status Survey: Is the patient deaf or have difficulty hearing?: No Does the patient have difficulty seeing, even when wearing glasses/contacts?: No Does the patient have difficulty concentrating, remembering, or making decisions?: No Does the patient have difficulty walking or climbing stairs?: No Does the patient have difficulty dressing or bathing?: No Does the patient have difficulty doing errands alone such as visiting a doctor's office or shopping?: No    Assessment & Plan  1. Type 2 diabetes mellitus with microalbuminuria, without long-term current use of insulin (HCC)  - POCT HgB A1C - metFORMIN (GLUCOPHAGE-XR) 750 MG 24 hr tablet; Take 2 tablets (1,500 mg total) by mouth daily with breakfast.  Dispense: 180 tablet; Refill: 1  2. Dyslipidemia  - atorvastatin (LIPITOR) 40 MG tablet; Take 1 tablet (40 mg total) by mouth daily.  Dispense: 90 tablet; Refill: 1  3. Perennial allergic rhinitis with seasonal variation  - levocetirizine (XYZAL) 5 MG tablet; Take 1 tablet (5 mg total) by mouth daily.  Dispense: 90 tablet; Refill: 1 - montelukast (SINGULAIR) 10 MG tablet; Take 1 tablet (10 mg total) by mouth at bedtime.  Dispense: 90 tablet; Refill: 1  4. Microalbuminuria   5. Obesity (BMI 30-39.9)   Discussed with the patient the risk posed by an increased BMI. Discussed importance of portion control, calorie counting and at least 150 minutes of physical activity weekly. Avoid sweet  beverages and drink more water. Eat at least 6 servings of fruit and vegetables daily   6. Benign essential HTN  7. Factor 5 Leiden mutation, heterozygous ([place])   8. Vitamin D deficiency   9. History of iron deficiency   10. Hypertension associated with diabetes ([place])  At goal

## 2020-02-29 ENCOUNTER — Other Ambulatory Visit: Payer: Self-pay

## 2020-02-29 ENCOUNTER — Ambulatory Visit
Admission: RE | Admit: 2020-02-29 | Discharge: 2020-02-29 | Disposition: A | Discharge status: Home / Self Care | Payer: BC Managed Care – PPO | Source: Ambulatory Visit | Attending: Family Medicine | Admitting: Family Medicine

## 2020-02-29 ENCOUNTER — Encounter: Payer: Self-pay | Admitting: Family Medicine

## 2020-02-29 ENCOUNTER — Ambulatory Visit: Payer: BC Managed Care – PPO | Admitting: Family Medicine

## 2020-02-29 VITALS — BP 120/68 | HR 97 | Temp 98.0°F | Resp 18 | Ht 69.0 in | Wt 229.0 lb

## 2020-02-29 DIAGNOSIS — E1159 Type 2 diabetes mellitus with other circulatory complications: Secondary | ICD-10-CM

## 2020-02-29 DIAGNOSIS — E559 Vitamin D deficiency, unspecified: Secondary | ICD-10-CM

## 2020-02-29 DIAGNOSIS — J302 Other seasonal allergic rhinitis: Secondary | ICD-10-CM

## 2020-02-29 DIAGNOSIS — E1129 Type 2 diabetes mellitus with other diabetic kidney complication: Secondary | ICD-10-CM

## 2020-02-29 DIAGNOSIS — E669 Obesity, unspecified: Secondary | ICD-10-CM

## 2020-02-29 DIAGNOSIS — I152 Hypertension secondary to endocrine disorders: Secondary | ICD-10-CM

## 2020-02-29 DIAGNOSIS — E785 Hyperlipidemia, unspecified: Secondary | ICD-10-CM

## 2020-02-29 DIAGNOSIS — I10 Essential (primary) hypertension: Secondary | ICD-10-CM

## 2020-02-29 DIAGNOSIS — Z1231 Encounter for screening mammogram for malignant neoplasm of breast: Secondary | ICD-10-CM | POA: Diagnosis not present

## 2020-02-29 DIAGNOSIS — R809 Proteinuria, unspecified: Secondary | ICD-10-CM

## 2020-02-29 DIAGNOSIS — J3089 Other allergic rhinitis: Secondary | ICD-10-CM

## 2020-02-29 DIAGNOSIS — D6851 Activated protein C resistance: Secondary | ICD-10-CM

## 2020-02-29 DIAGNOSIS — Z8639 Personal history of other endocrine, nutritional and metabolic disease: Secondary | ICD-10-CM

## 2020-02-29 LAB — POCT GLYCOSYLATED HEMOGLOBIN (HGB A1C): Hemoglobin A1C: 6.7 % — AB (ref 4.0–5.6)

## 2020-02-29 MED ORDER — ATORVASTATIN CALCIUM 40 MG PO TABS: 40.0000 mg | ORAL_TABLET | Freq: Every day | ORAL | 1 refills | Status: DC

## 2020-02-29 MED ORDER — LEVOCETIRIZINE DIHYDROCHLORIDE 5 MG PO TABS: 5.0000 mg | ORAL_TABLET | Freq: Every day | ORAL | 1 refills | Status: DC

## 2020-02-29 MED ORDER — METFORMIN HCL ER 750 MG PO TB24: 1500.0000 mg | ORAL_TABLET | Freq: Every day | ORAL | 1 refills | Status: DC

## 2020-02-29 MED ORDER — MONTELUKAST SODIUM 10 MG PO TABS: 10.0000 mg | ORAL_TABLET | Freq: Every day | ORAL | 1 refills | Status: DC

## 2020-03-11 ENCOUNTER — Telehealth: Payer: Self-pay

## 2020-03-11 NOTE — Telephone Encounter (Signed)
Copied from CRM [id number]. Topic: General - Other >> Mar 11, 2020  1:52 PM Emily Pitts wrote: Patient called to inform PCP that she tested positive for covid and wants to know what Dr. Bryan Lemma would advise she do next. Please advise

## 2020-03-12 ENCOUNTER — Encounter: Payer: Self-pay | Admitting: Family Medicine

## 2020-03-15 ENCOUNTER — Encounter: Payer: Self-pay | Admitting: Family Medicine

## 2020-04-18 ENCOUNTER — Other Ambulatory Visit: Payer: Self-pay | Admitting: Hematology and Oncology

## 2020-04-18 DIAGNOSIS — I81 Portal vein thrombosis: Secondary | ICD-10-CM

## 2020-04-18 DIAGNOSIS — D6851 Activated protein C resistance: Secondary | ICD-10-CM

## 2020-05-02 ENCOUNTER — Other Ambulatory Visit: Payer: Self-pay

## 2020-05-02 DIAGNOSIS — I81 Portal vein thrombosis: Secondary | ICD-10-CM

## 2020-05-06 ENCOUNTER — Inpatient Hospital Stay: Payer: Medicare PPO | Attending: Hematology and Oncology

## 2020-05-06 ENCOUNTER — Other Ambulatory Visit: Payer: Self-pay

## 2020-05-06 DIAGNOSIS — I81 Portal vein thrombosis: Secondary | ICD-10-CM

## 2020-05-06 DIAGNOSIS — E611 Iron deficiency: Secondary | ICD-10-CM | POA: Diagnosis present

## 2020-05-06 LAB — COMPREHENSIVE METABOLIC PANEL
ALT: 23 U/L (ref 0–44)
AST: 23 U/L (ref 15–41)
Albumin: 3.9 g/dL (ref 3.5–5.0)
Alkaline Phosphatase: 75 U/L (ref 38–126)
Anion gap: 9 (ref 5–15)
BUN: 11 mg/dL (ref 8–23)
CO2: 28 mmol/L (ref 22–32)
Calcium: 9.7 mg/dL (ref 8.9–10.3)
Chloride: 99 mmol/L (ref 98–111)
Creatinine, Ser: 0.68 mg/dL (ref 0.44–1.00)
GFR, Estimated: >60 mL/min (ref >60)
Glucose, Bld: 92 mg/dL (ref 70–99)
Potassium: 4.2 mmol/L (ref 3.5–5.1)
Sodium: 136 mmol/L (ref 135–145)
Total Bilirubin: 0.5 mg/dL (ref 0.3–1.2)
Total Protein: 7.4 g/dL (ref 6.5–8.1)

## 2020-05-06 LAB — CBC WITH DIFFERENTIAL/PLATELET
Abs Immature Granulocytes: 0.03 10*3/uL (ref 0.00–0.07)
Basophils Absolute: 0 10*3/uL (ref 0.0–0.1)
Basophils Relative: 0 %
Eosinophils Absolute: 0.2 10*3/uL (ref 0.0–0.5)
Eosinophils Relative: 2 %
HCT: 39.2 % (ref 36.0–46.0)
Hemoglobin: 12.7 g/dL (ref 12.0–15.0)
Immature Granulocytes: 0 %
Lymphocytes Relative: 24 %
Lymphs Abs: 2.4 10*3/uL (ref 0.7–4.0)
MCH: 27.5 pg (ref 26.0–34.0)
MCHC: 32.4 g/dL (ref 30.0–36.0)
MCV: 84.8 fL (ref 80.0–100.0)
Monocytes Absolute: 0.9 10*3/uL (ref 0.1–1.0)
Monocytes Relative: 10 %
Neutro Abs: 6.4 10*3/uL (ref 1.7–7.7)
Neutrophils Relative %: 64 %
Platelets: 231 10*3/uL (ref 150–400)
RBC: 4.62 MIL/uL (ref 3.87–5.11)
RDW: 14.6 % (ref 11.5–15.5)
WBC: 9.9 10*3/uL (ref 4.0–10.5)
nRBC: 0 % (ref 0.0–0.2)

## 2020-05-06 LAB — FERRITIN: Ferritin: 23 ng/mL (ref 11–307)

## 2020-05-14 ENCOUNTER — Other Ambulatory Visit: Payer: Self-pay | Admitting: Family Medicine

## 2020-05-14 DIAGNOSIS — I10 Essential (primary) hypertension: Secondary | ICD-10-CM

## 2020-05-14 NOTE — Telephone Encounter (Signed)
Requested Prescriptions  Pending Prescriptions Disp Refills  . olmesartan-hydrochlorothiazide (BENICAR HCT) 20-12.5 MG tablet [Pharmacy Med Name: Olmesartan Medoxomil-HCTZ 20-12.5 MG Oral Tablet] 90 tablet 0    Sig: Take 1 tablet by mouth once daily     Cardiovascular: ARB + Diuretic Combos Passed - 05/14/2020  8:54 AM      Passed - K in normal range and within 180 days    Potassium  Date Value Ref Range Status  05/06/2020 4.2 3.5 - 5.1 mmol/L Final         Passed - Na in normal range and within 180 days    Sodium  Date Value Ref Range Status  05/06/2020 136 135 - 145 mmol/L Final  10/30/2015 143 134 - 144 mmol/L Final         Passed - Cr in normal range and within 180 days    Creat  Date Value Ref Range Status  11/28/2019 0.69 0.50 - 0.99 mg/dL Final    Comment:    For patients >34 years of age, the reference limit for Creatinine is approximately 13% higher for people identified as African-American. .    Creatinine, Ser  Date Value Ref Range Status  05/06/2020 0.68 0.44 - 1.00 mg/dL Final   Creatinine, Urine  Date Value Ref Range Status  11/28/2019 18 (L) 20 - 275 mg/dL Final         Passed - Ca in normal range and within 180 days    Calcium  Date Value Ref Range Status  05/06/2020 9.7 8.9 - 10.3 mg/dL Final         Passed - Patient is not pregnant      Passed - Last BP in normal range    BP Readings from Last 1 Encounters:  02/29/20 120/68         Passed - Valid encounter within last 6 months    Recent Outpatient Visits          2 months ago Type 2 diabetes mellitus with microalbuminuria, without long-term current use of insulin [facility])   [facility] [place], Drue Stager, MD   6 months ago Type 2 diabetes mellitus with microalbuminuria, without long-term current use of insulin [facility])   [facility] [place], Drue Stager, MD   1 year ago Type 2 diabetes mellitus with microalbuminuria, without long-term current use of insulin [facility])    [facility] Steele Sizer, MD   1 year ago Well adult exam   [facility] [place], Drue Stager, MD   1 year ago Type 2 diabetes mellitus with microalbuminuria, without long-term current use of insulin [facility])   [facility] Steele Sizer, MD      Future Appointments            In 4 weeks [facility], [place], MD [place]   In 1 month Ancil Boozer, Drue Stager, MD [facility], [facility]

## 2020-05-20 ENCOUNTER — Other Ambulatory Visit: Payer: Self-pay | Admitting: Family Medicine

## 2020-05-20 DIAGNOSIS — J302 Other seasonal allergic rhinitis: Secondary | ICD-10-CM

## 2020-05-23 ENCOUNTER — Other Ambulatory Visit: Payer: Self-pay | Admitting: Family Medicine

## 2020-05-23 NOTE — Telephone Encounter (Signed)
Requested Prescriptions  Pending Prescriptions Disp Refills   fluticasone (FLONASE) 50 MCG/ACT nasal spray [Pharmacy Med Name: Fluticasone Propionate 50 MCG/ACT Nasal Suspension] 48 g 0    Sig: USE 2 SPRAY(S) IN EACH NOSTRIL AS NEEDED     Ear, Nose, and Throat: Nasal Preparations - Corticosteroids Passed - 05/23/2020  8:11 AM      Passed - Valid encounter within last 12 months    Recent Outpatient Visits          2 months ago Type 2 diabetes mellitus with microalbuminuria, without long-term current use of insulin [facility])   [facility] [place], Drue Stager, MD   6 months ago Type 2 diabetes mellitus with microalbuminuria, without long-term current use of insulin [facility])   [facility] [place], Drue Stager, MD   1 year ago Type 2 diabetes mellitus with microalbuminuria, without long-term current use of insulin [facility])   [facility] Steele Sizer, MD   1 year ago Well adult exam   [facility] [place], Drue Stager, MD   1 year ago Type 2 diabetes mellitus with microalbuminuria, without long-term current use of insulin [facility])   [facility] Steele Sizer, MD      Future Appointments            In 2 weeks [facility], [place], [place]   In 1 month Ancil Boozer, Drue Stager, MD [facility], [facility]

## 2020-06-12 ENCOUNTER — Other Ambulatory Visit: Payer: Self-pay

## 2020-06-12 ENCOUNTER — Ambulatory Visit: Payer: Medicare PPO | Admitting: Dermatology

## 2020-06-12 DIAGNOSIS — D2261 Melanocytic nevi of right upper limb, including shoulder: Secondary | ICD-10-CM | POA: Diagnosis not present

## 2020-06-12 DIAGNOSIS — D229 Melanocytic nevi, unspecified: Secondary | ICD-10-CM

## 2020-06-12 DIAGNOSIS — L570 Actinic keratosis: Secondary | ICD-10-CM

## 2020-06-12 DIAGNOSIS — L821 Other seborrheic keratosis: Secondary | ICD-10-CM

## 2020-06-12 DIAGNOSIS — Z1283 Encounter for screening for malignant neoplasm of skin: Secondary | ICD-10-CM | POA: Diagnosis not present

## 2020-06-12 DIAGNOSIS — S60562A Insect bite (nonvenomous) of left hand, initial encounter: Secondary | ICD-10-CM | POA: Diagnosis not present

## 2020-06-12 DIAGNOSIS — Z872 Personal history of diseases of the skin and subcutaneous tissue: Secondary | ICD-10-CM

## 2020-06-12 DIAGNOSIS — L578 Other skin changes due to chronic exposure to nonionizing radiation: Secondary | ICD-10-CM

## 2020-06-12 DIAGNOSIS — D1800 Hemangioma unspecified site: Secondary | ICD-10-CM

## 2020-06-12 DIAGNOSIS — W57XXXA Bitten or stung by nonvenomous insect and other nonvenomous arthropods, initial encounter: Secondary | ICD-10-CM

## 2020-06-12 DIAGNOSIS — D492 Neoplasm of unspecified behavior of bone, soft tissue, and skin: Secondary | ICD-10-CM

## 2020-06-12 DIAGNOSIS — L814 Other melanin hyperpigmentation: Secondary | ICD-10-CM

## 2020-06-12 NOTE — Patient Instructions (Addendum)
Wound Care Instructions  1. Cleanse wound gently with soap and water once a day then pat dry with clean gauze. Apply a thing coat of Petrolatum (petroleum jelly, "Vaseline") over the wound (unless you have an allergy to this). We recommend that you use a new, sterile tube of Vaseline. Do not pick or remove scabs. Do not remove the yellow or white "healing tissue" from the base of the wound.  2. Cover the wound with fresh, clean, nonstick gauze and secure with paper tape. You may use Band-Aids in place of gauze and tape if the would is small enough, but would recommend trimming much of the tape off as there is often too much. Sometimes Band-Aids can irritate the skin.  3. You should call the office for your biopsy report after 1 week if you have not already been contacted.  4. If you experience any problems, such as abnormal amounts of bleeding, swelling, significant bruising, significant pain, or evidence of infection, please call the office immediately.  5. FOR ADULT SURGERY PATIENTS: If you need something for pain relief you may take 1 extra strength Tylenol (acetaminophen) AND 2 Ibuprofen (200mg  each) together every 4 hours as needed for pain. (do not take these if you are allergic to them or if you have a reason you should not take them.) Typically, you may only need pain medication for 1 to 3 days.       If you have any questions or concerns for your doctor, please call our main line at [phone] and press option 4 to reach your doctor's medical assistant. If no one answers, please leave a voicemail as directed and we will return your call as soon as possible. Messages left after 4 pm will be answered the following business day.   You may also send [place] a message via [facility]. We typically respond to MyChart messages within 1-2 business days.  For prescription refills, please ask your pharmacy to contact our office. Our fax number is [phone].  If you have an urgent issue when the  clinic is closed that cannot wait until the next business day, you can page your doctor at the number below.    Please note that while we do our best to be available for urgent issues outside of office hours, we are not available 24/7.   If you have an urgent issue and are unable to reach [place], you may choose to seek medical care at your doctor's office, retail clinic, urgent care center, or emergency room.  If you have a medical emergency, please immediately call 911 or go to the emergency department.  Pager Numbers  - Dr. Nehemiah Massed: [phone]  - Dr. Laurence Ferrari: [phone]  - Dr. Nicole Kindred: [phone]  In the event of inclement weather, please call our main line at [phone] for an update on the status of any delays or closures.  Dermatology Medication Tips: Please keep the boxes that topical medications come in in order to help keep track of the instructions about where and how to use these. Pharmacies typically print the medication instructions only on the boxes and not directly on the medication tubes.   If your medication is too expensive, please contact our office at [phone] option 4 or send [place] a message through [place].   We are unable to tell what your co-pay for medications will be in advance as this is different depending on your insurance coverage. However, we may be able to find a substitute medication at lower cost or fill  out paperwork to get insurance to cover a needed medication.   If a prior authorization is required to get your medication covered by your insurance company, please allow [place] 1-2 business days to complete this process.  Drug prices often vary depending on where the prescription is filled and some pharmacies may offer cheaper prices.  The website www.goodrx.com contains coupons for medications through different pharmacies. The prices here do not account for what the cost may be with help from insurance (it may be cheaper with your insurance), but the  website can give you the price if you did not use any insurance.  - You can print the associated coupon and take it with your prescription to the pharmacy.  - You may also stop by our office during regular business hours and pick up a GoodRx coupon card.  - If you need your prescription sent electronically to a different pharmacy, notify our office through [facility] or by phone at [phone] option 4.  Recommend daily broad spectrum sunscreen SPF 30+ to sun-exposed areas, reapply every 2 hours as needed. Call for new or changing lesions.  Staying in the shade or wearing long sleeves, sun glasses (UVA+UVB protection) and wide brim hats (4-inch brim around the entire circumference of the hat) are also recommended for sun protection.   Recommend taking Heliocare sun protection supplement daily in sunny weather for additional sun protection. For maximum protection on the sunniest days, you can take up to 2 capsules of regular Heliocare OR take 1 capsule of Heliocare Ultra. For prolonged exposure (such as a full day in the sun), you can repeat your dose of the supplement 4 hours after your first dose. Heliocare can be purchased at [facility] or at [email or web address].

## 2020-06-12 NOTE — Progress Notes (Signed)
Follow-Up Visit   Subjective  Emily Pitts is a 65 y.o. female who presents for the following: Annual Exam (Here for annual skin cancer screening. Full body. Few areas of concern on face and hands. ) and Actinic Keratosis (Hx of AK. Left medial cheek. Tx with LN2. Pink. Mild. ).  The following portions of the chart were reviewed this encounter and updated as appropriate:  Tobacco  Allergies  Meds  Problems  Med Hx  Surg Hx  Fam Hx     Review of Systems: No other skin or systemic complaints except as noted in HPI or Assessment and Plan.  Objective  Well appearing patient in no apparent distress; mood and affect are within normal limits.  A full examination was performed including scalp, head, eyes, ears, nose, lips, neck, chest, axillae, abdomen, back, buttocks, bilateral upper extremities, bilateral lower extremities, hands, feet, fingers, toes, fingernails, and toenails. All findings within normal limits unless otherwise noted below.  Objective  Left Dorsal Hand: Pink excoriated papule  Objective  Left Upper Arm x1, right dorsal hand x3, right calf x1 (5): Erythematous thin papules/macules with gritty scale.   Objective  Right upper arm: 0.5cm blue irregular papule        Assessment & Plan  Bug bite without infection, initial encounter Left Dorsal Hand  Observe.   AK (actinic keratosis) (5) Left Upper Arm x1, right dorsal hand x3, right calf x1  Destruction of lesion - Left Upper Arm x1, right dorsal hand x3, right calf x1  Destruction method: cryotherapy   Informed consent: discussed and consent obtained   Lesion destroyed using liquid nitrogen: Yes   Cryotherapy cycles:  2 Outcome: patient tolerated procedure well with no complications   Post-procedure details: wound care instructions given    Neoplasm of skin Right upper arm  Epidermal / dermal shaving  Lesion diameter (cm):  0.5 Informed consent: discussed and consent obtained   Patient was  prepped and draped in usual sterile fashion: Area prepped with alcohol. Anesthesia: the lesion was anesthetized in a standard fashion   Anesthetic:  1% lidocaine w/ epinephrine 1-100,000 buffered w/ 8.4% NaHCO3 Instrument used: flexible razor blade   Hemostasis achieved with: pressure, aluminum chloride and electrodesiccation   Outcome: patient tolerated procedure well   Post-procedure details: wound care instructions given   Post-procedure details comment:  Ointment and small bandage applied  Specimen 1 - Surgical pathology Differential Diagnosis: R/O vascular vs blue nevus vs MM  Check Margins: No 0.5cm blue irregular papule. Two specimens in bottle   Lentigines - Scattered tan macules - Due to sun exposure - Benign-appering, observe - Recommend daily broad spectrum sunscreen SPF 30+ to sun-exposed areas, reapply every 2 hours as needed. - Call for any changes  Seborrheic Keratoses - Stuck-on, waxy, tan-brown papules and/or plaques  - Benign-appearing - Discussed benign etiology and prognosis. - Observe - Call for any changes  Melanocytic Nevi - Tan-brown and/or pink-flesh-colored symmetric macules and papules - Benign appearing on exam today - Observation - Call clinic for new or changing moles - Recommend daily use of broad spectrum spf 30+ sunscreen to sun-exposed areas.   Hemangiomas - Red papules - Discussed benign nature - Observe - Call for any changes  Actinic Damage - Chronic condition, secondary to cumulative UV/sun exposure - diffuse scaly erythematous macules with underlying dyspigmentation - Recommend daily broad spectrum sunscreen SPF 30+ to sun-exposed areas, reapply every 2 hours as needed.  - Staying in the shade or wearing  long sleeves, sun glasses (UVA+UVB protection) and wide brim hats (4-inch brim around the entire circumference of the hat) are also recommended for sun protection.  - Call for new or changing lesions.  Skin cancer screening  performed today.   Return in about 3 months (around 09/11/2020) for AK recheck, 1 year TBSE.   I, Emelia Salisbury, CMA, am acting as scribe for Forest Gleason, MD.   Documentation: I have reviewed the above documentation for accuracy and completeness, and I agree with the above.  Forest Gleason, MD

## 2020-06-17 ENCOUNTER — Encounter: Payer: Self-pay | Admitting: Dermatology

## 2020-06-20 ENCOUNTER — Encounter: Payer: Self-pay | Admitting: Dermatology

## 2020-07-01 NOTE — Progress Notes (Signed)
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 She states very seldom has hypoglycemic episodes when she skips a meal.She deniesblurred vision, polyphagia, polyuria or polydipsia. Chronic nocturia at least once at night - she drinks a lot of water in the evenings. She is on ARB , she has numbness on both feet , last B12 towards low end of normal and has been taking B12 SL but forgets to take it daily   Obesity:she retired, , down 8 lbs , walking her dog and also riding her bike with her nephew about 4 times a week.   HTN: she is onBenicar hctzshe is taking one pill daily,no chest pain, palpitation, SOB or dizzinessBP is at goal.    Hyperlipidemia: she is now on Atorvastatin and denies side effects .LDL is at goal, good cholesterol was a little low, she has been more active since last visit, has been eating almonds but has not been eating fish    GERD: she states only takes prn Tums now and rarely has problems. She states had some symptoms this am, but usually sporadically   Perennial AR with seasonal variation: She is on singulair, xyzal and Astelin, she states always has rhinorrhea since pollen is count is high she has noticed mild eye pruritis intermittently   Portal Vein Thrombosis: seen hematologistDr. Corcooran, taking Xarelto, denies any bleeding. She has  factor V leiden .She was diagnosed with iron deficiency anemia of unknown cause, but HCT has been stable, no longer infusions but taking otc supplementation .She had some blood in stools earlier this year and is due for EGD and colonoscopy   Constipation:  [place] 4 with medication.Still taking miralax only prn. Unchanged  Patient Active Problem List   Diagnosis Date  Noted  . Iron deficiency anemia 02/14/2018  . 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 Long term current use of anticoagulant therapy 01/05/2016  . Factor 5 Leiden mutation, heterozygous ([place]) 05/02/2015  . Portal vein thrombosis 11/07/2014  . History of sepsis 10/10/2014  . Abnormal electrocardiogram 10/08/2014  . Nonspecific abnormal electromyogram (EMG) 10/08/2014  . Benign essential HTN 10/08/2014  . Diabetes mellitus with renal manifestation ([place]) 10/08/2014  . Dyslipidemia 10/08/2014  . LBP (low back pain) 10/08/2014  . Gastro-esophageal reflux disease without esophagitis 10/08/2014  . Microalbuminuria 10/08/2014  . Disturbance of skin sensation 10/08/2014  . Obesity (BMI 30-39.9) 10/08/2014  . Perennial allergic rhinitis with seasonal variation 10/08/2014  . Plantar fasciitis 10/08/2014  . Postablative ovarian failure 10/08/2014  . Menopausal symptom 10/08/2014  . Vitamin D deficiency 10/08/2014    Past Surgical History:  Procedure Laterality Date  . ABDOMINAL HYSTERECTOMY  2010  . APPENDECTOMY  1966  . COLONOSCOPY  06-07-13   Dr Bary Castilla  . DILATION AND CURETTAGE OF UTERUS    . OOPHORECTOMY      Family History  Problem Relation Age of Onset  . Diabetes Mother   . COPD Mother   . Diabetes Father   . Hypertension Father   . Cancer Father        Kidney and Prostate  . CAD Father   . Diabetes Brother        [employer]  .  Kidney disease Brother        Tumor removed  . Breast cancer Neg Hx     Social History   Tobacco Use  . Smoking status: Never Smoker  . Smokeless tobacco: Never Used  Substance Use Topics  . Alcohol use: No    Alcohol/week: 0.0 standard drinks     Current Outpatient Medications:  .  aspirin EC 81 MG tablet, Take 81 mg by mouth daily., Disp: , Rfl:  .  atorvastatin (LIPITOR) 40 MG tablet, Take 1 tablet (40 mg total) by mouth daily., Disp: 90 tablet, Rfl: 1 .  azelastine (ASTELIN) 0.1 % nasal spray, Place 2 sprays into both nostrils  2 (two) times daily. Use in each nostril as directed, Disp: 30 mL, Rfl: 2 .  cholecalciferol (VITAMIN D) 1000 units tablet, Take 500 Units by mouth daily. , Disp: , Rfl:  .  fluticasone (FLONASE) 50 MCG/ACT nasal spray, USE 2 SPRAY(S) IN EACH NOSTRIL AS NEEDED, Disp: 48 g, Rfl: 0 .  glucose blood (ONE TOUCH ULTRA TEST) test strip, USE AS DIRECTED, Disp: 100 each, Rfl: 12 .  Iron-FA-B Cmp-C-Biot-Probiotic (FUSION PLUS PO), Take 1 tablet by mouth daily. Vitamin with iron, Disp: , Rfl:  .  levocetirizine (XYZAL) 5 MG tablet, Take 1 tablet (5 mg total) by mouth daily., Disp: 90 tablet, Rfl: 1 .  MAGNESIUM OXIDE PO, Take 500 mg by mouth daily. , Disp: , Rfl:  .  metFORMIN (GLUCOPHAGE-XR) 750 MG 24 hr tablet, Take 2 tablets (1,500 mg total) by mouth daily with breakfast., Disp: 180 tablet, Rfl: 1 .  montelukast (SINGULAIR) 10 MG tablet, TAKE 1 TABLET BY MOUTH AT BEDTIME, Disp: 90 tablet, Rfl: 0 .  olmesartan-hydrochlorothiazide (BENICAR HCT) 20-12.5 MG tablet, Take 1 tablet by mouth once daily, Disp: 90 tablet, Rfl: 0 .  polyethylene glycol powder (GLYCOLAX/MIRALAX) powder, Take 17 g by mouth 2 (two) times daily., Disp: 3350 g, Rfl: 1 .  tiZANidine (ZANAFLEX) 2 MG tablet, Take 1 tablet (2 mg total) by mouth every 6 (six) hours as needed for muscle spasms., Disp: 30 tablet, Rfl: 0 .  Turmeric 500 MG TABS, Take 1 tablet by mouth daily., Disp: , Rfl:  .  vitamin B-12 (CYANOCOBALAMIN) 500 MCG tablet, Take 1,000 mcg by mouth daily., Disp: , Rfl:  .  XARELTO 20 MG TABS tablet, TAKE 1 TABLET BY MOUTH ONCE DAILY WITH SUPPER, Disp: 90 tablet, Rfl: 0 .  chlorzoxazone (PARAFON) 500 MG tablet, Take 1 tablet (500 mg total) by mouth 3 (three) times daily as needed. (Patient not taking: Reported on 07/02/2020), Disp: 30 tablet, Rfl: 0 .  diclofenac sodium (VOLTAREN) 1 % GEL, Apply 4 g topically 4 (four) times daily. (Patient not taking: No sig reported), Disp: 100 g, Rfl: 1 .  Menthol, Topical Analgesic, 4 % GEL, Apply 2  g topically 4 (four) times daily. (Patient not taking: Reported on 07/02/2020), Disp: 1 Tube, Rfl: 1  No Known Allergies  I personally reviewed active problem list, medication list, allergies, family history, social history, health maintenance with the patient/caregiver today.   ROS  Constitutional: Negative for fever , positive for weight change.  Respiratory: Negative for cough and shortness of breath.   Cardiovascular: Negative for chest pain or palpitations.  Gastrointestinal: Negative for abdominal pain, no bowel changes.  Musculoskeletal: Negative for gait problem or joint swelling.  Skin: Negative for rash.  Neurological: Negative for dizziness or headache.  No other specific complaints in a complete review of systems (except  as listed in HPI above).   Objective  Vitals:   07/02/20 0918  BP: 130/66  Pulse: 89  Resp: 16  Temp: 98.3 F (36.8 C)  TempSrc: Oral  SpO2: 99%  Weight: 221 lb (100.2 kg)  Height: 5\' 8"  (1.727 m)    Body mass index is 33.6 kg/m.  Physical Exam  Constitutional: Patient appears well-developed and well-nourished. Obese  No distress.  HEENT: head atraumatic, normocephalic, pupils equal and reactive to light,  neck supple Cardiovascular: Normal rate, regular rhythm and normal heart sounds.  No murmur heard. No BLE edema. Pulmonary/Chest: Effort normal and breath sounds normal. No respiratory distress. Abdominal: Soft.  There is no tenderness. Psychiatric: Patient has a normal mood and affect. behavior is normal. Judgment and thought content normal.  Recent Results (from the past 2160 hour(s))  Ferritin     Status: None   Collection Time: 05/06/20 11:45 AM  Result Value Ref Range   Ferritin 23 11 - 307 ng/mL    Comment: Performed at [facility], [address] [place], [place] [address]  CBC with Differential/Platelet     Status: None   Collection Time: 05/06/20 11:45 AM  Result Value Ref Range   WBC 9.9 4.0 - 10.5 K/uL   RBC  4.62 3.87 - 5.11 MIL/uL   Hemoglobin 12.7 12.0 - 15.0 g/dL   HCT 39.2 36.0 - 46.0 %   MCV 84.8 80.0 - 100.0 fL   MCH 27.5 26.0 - 34.0 pg   MCHC 32.4 30.0 - 36.0 g/dL   RDW 14.6 11.5 - 15.5 %   Platelets 231 150 - 400 K/uL   nRBC 0.0 0.0 - 0.2 %   Neutrophils Relative % 64 %   Neutro Abs 6.4 1.7 - 7.7 K/uL   Lymphocytes Relative 24 %   Lymphs Abs 2.4 0.7 - 4.0 K/uL   Monocytes Relative 10 %   Monocytes Absolute 0.9 0.1 - 1.0 K/uL   Eosinophils Relative 2 %   Eosinophils Absolute 0.2 0.0 - 0.5 K/uL   Basophils Relative 0 %   Basophils Absolute 0.0 0.0 - 0.1 K/uL   Immature Granulocytes 0 %   Abs Immature Granulocytes 0.03 0.00 - 0.07 K/uL    Comment: Performed at [facility], [address] [place], [place] [address]  Comprehensive metabolic panel     Status: None   Collection Time: 05/06/20 11:45 AM  Result Value Ref Range   Sodium 136 135 - 145 mmol/L   Potassium 4.2 3.5 - 5.1 mmol/L   Chloride 99 98 - 111 mmol/L   CO2 28 22 - 32 mmol/L   Glucose, Bld 92 70 - 99 mg/dL    Comment: Glucose reference range applies only to samples taken after fasting for at least 8 hours.   BUN 11 8 - 23 mg/dL   Creatinine, Ser 0.68 0.44 - 1.00 mg/dL   Calcium 9.7 8.9 - 10.3 mg/dL   Total Protein 7.4 6.5 - 8.1 g/dL   Albumin 3.9 3.5 - 5.0 g/dL   AST 23 15 - 41 U/L   ALT 23 0 - 44 U/L   Alkaline Phosphatase 75 38 - 126 U/L   Total Bilirubin 0.5 0.3 - 1.2 mg/dL   GFR, Estimated >60 >60 mL/min    Comment: (NOTE) Calculated using the CKD-EPI Creatinine Equation (2021)    Anion gap 9 5 - 15    Comment: Performed at [facility], [address], [place], [place] [address]  POCT HgB A1C     Status: Abnormal   Collection Time: 07/02/20  9:21 AM  Result Value Ref Range   Hemoglobin A1C 6.2 (A) 4.0 - 5.6 %   HbA1c POC (<> result, manual entry)     HbA1c, POC (prediabetic range)     HbA1c, POC (controlled diabetic range)        PHQ2/9: Depression screen [facility] 2/9  07/02/2020 02/29/2020 11/06/2019 05/05/2019 05/05/2019  Decreased Interest 0 0 0 0 0  Down, Depressed, Hopeless 0 0 0 0 0  PHQ - 2 Score 0 0 0 0 0  Altered sleeping - - 3 3 0  Tired, decreased energy - - 2 0 0  Change in appetite - - 2 0 0  Feeling bad or failure about yourself  - - 0 0 0  Trouble concentrating - - 0 0 0  Moving slowly or fidgety/restless - - 0 0 0  Suicidal thoughts - - 0 0 0  PHQ-9 Score - - 7 3 0  Difficult doing work/chores - - Not difficult at all Not difficult at all Not difficult at all  Some recent data might be hidden    phq 9 is negative   Fall Risk: Fall Risk  07/02/2020 02/29/2020 11/06/2019 05/05/2019 03/01/2019  Falls in the past year? 0 0 0 0 0  Number falls in past yr: 0 0 0 0 0  Injury with Fall? 0 0 0 0 0     Functional Status Survey: Is the patient deaf or have difficulty hearing?: No Does the patient have difficulty seeing, even when wearing glasses/contacts?: No Does the patient have difficulty concentrating, remembering, or making decisions?: Yes Does the patient have difficulty walking or climbing stairs?: No Does the patient have difficulty dressing or bathing?: No Does the patient have difficulty doing errands alone such as visiting a doctor's office or shopping?: No    Assessment & Plan  1. Type 2 diabetes mellitus with microalbuminuria, without long-term current use of insulin (HCC)  - POCT HgB A1C - metFORMIN (GLUCOPHAGE-XR) 750 MG 24 hr tablet; Take 2 tablets (1,500 mg total) by mouth daily with breakfast.  Dispense: 180 tablet; Refill: 1  2. Dyslipidemia  - atorvastatin (LIPITOR) 40 MG tablet; Take 1 tablet (40 mg total) by mouth daily.  Dispense: 90 tablet; Refill: 1  3. Perennial allergic rhinitis with seasonal variation  - azelastine (ASTELIN) 0.1 % nasal spray; Place 2 sprays into both nostrils 2 (two) times daily. Use in each nostril as directed  Dispense: 30 mL; Refill: 2 - levocetirizine (XYZAL) 5 MG tablet; Take 1  tablet (5 mg total) by mouth daily.  Dispense: 90 tablet; Refill: 1 - montelukast (SINGULAIR) 10 MG tablet; Take 1 tablet (10 mg total) by mouth at bedtime.  Dispense: 90 tablet; Refill: 1  4. Benign essential HTN  - olmesartan-hydrochlorothiazide (BENICAR HCT) 20-12.5 MG tablet; Take 1 tablet by mouth daily.  Dispense: 90 tablet; Refill: 0  5. Microalbuminuria   6. Factor 5 Leiden mutation, heterozygous [facility])  Keep follow up with Dr. Mike Gip  7. Hypertension associated with diabetes ([place])   8. Portal vein thrombosis   9. Gastro-esophageal reflux disease without esophagitis   10. Vitamin D deficiency   11. History of iron deficiency  - Ambulatory referral to General Surgery  12. Blood in stool  - Ambulatory referral to General Surgery  13. Intermittent low back pain  - tiZANidine (ZANAFLEX) 2 MG tablet; Take 1 tablet (2 mg total) by mouth  every 6 (six) hours as needed for muscle spasms.  Dispense: 30 tablet; Refill: 0

## 2020-07-02 ENCOUNTER — Encounter: Payer: Self-pay | Admitting: Family Medicine

## 2020-07-02 ENCOUNTER — Other Ambulatory Visit: Payer: Self-pay

## 2020-07-02 ENCOUNTER — Ambulatory Visit: Payer: BC Managed Care – PPO | Admitting: Family Medicine

## 2020-07-02 VITALS — BP 130/66 | HR 89 | Temp 98.3°F | Resp 16 | Ht 68.0 in | Wt 221.0 lb

## 2020-07-02 DIAGNOSIS — E785 Hyperlipidemia, unspecified: Secondary | ICD-10-CM

## 2020-07-02 DIAGNOSIS — M545 Low back pain, unspecified: Secondary | ICD-10-CM

## 2020-07-02 DIAGNOSIS — Z8639 Personal history of other endocrine, nutritional and metabolic disease: Secondary | ICD-10-CM

## 2020-07-02 DIAGNOSIS — R809 Proteinuria, unspecified: Secondary | ICD-10-CM | POA: Diagnosis not present

## 2020-07-02 DIAGNOSIS — K219 Gastro-esophageal reflux disease without esophagitis: Secondary | ICD-10-CM

## 2020-07-02 DIAGNOSIS — I152 Hypertension secondary to endocrine disorders: Secondary | ICD-10-CM

## 2020-07-02 DIAGNOSIS — E1159 Type 2 diabetes mellitus with other circulatory complications: Secondary | ICD-10-CM

## 2020-07-02 DIAGNOSIS — E1129 Type 2 diabetes mellitus with other diabetic kidney complication: Secondary | ICD-10-CM

## 2020-07-02 DIAGNOSIS — K921 Melena: Secondary | ICD-10-CM

## 2020-07-02 DIAGNOSIS — J302 Other seasonal allergic rhinitis: Secondary | ICD-10-CM

## 2020-07-02 DIAGNOSIS — J3089 Other allergic rhinitis: Secondary | ICD-10-CM | POA: Diagnosis not present

## 2020-07-02 DIAGNOSIS — E559 Vitamin D deficiency, unspecified: Secondary | ICD-10-CM

## 2020-07-02 DIAGNOSIS — D6851 Activated protein C resistance: Secondary | ICD-10-CM

## 2020-07-02 DIAGNOSIS — I10 Essential (primary) hypertension: Secondary | ICD-10-CM

## 2020-07-02 DIAGNOSIS — Z114 Encounter for screening for human immunodeficiency virus [HIV]: Secondary | ICD-10-CM

## 2020-07-02 DIAGNOSIS — I81 Portal vein thrombosis: Secondary | ICD-10-CM

## 2020-07-02 LAB — POCT GLYCOSYLATED HEMOGLOBIN (HGB A1C): Hemoglobin A1C: 6.2 % — AB (ref 4.0–5.6)

## 2020-07-02 MED ORDER — METFORMIN HCL ER 750 MG PO TB24
1500.0000 mg | ORAL_TABLET | Freq: Every day | ORAL | 1 refills | Status: DC
Start: 2020-07-02 — End: 2021-05-13

## 2020-07-02 MED ORDER — AZELASTINE HCL 0.1 % NA SOLN
2.0000 | Freq: Two times a day (BID) | NASAL | 2 refills | Status: AC
Start: 2020-07-02 — End: ?

## 2020-07-02 MED ORDER — MONTELUKAST SODIUM 10 MG PO TABS: 1.0000 | ORAL_TABLET | Freq: Every day | ORAL | 1 refills | Status: DC

## 2020-07-02 MED ORDER — TIZANIDINE HCL 2 MG PO TABS: 2.0000 mg | ORAL_TABLET | Freq: Four times a day (QID) | ORAL | 0 refills | Status: DC | PRN

## 2020-07-02 MED ORDER — LEVOCETIRIZINE DIHYDROCHLORIDE 5 MG PO TABS: 5.0000 mg | ORAL_TABLET | Freq: Every day | ORAL | 1 refills | Status: DC

## 2020-07-02 MED ORDER — OLMESARTAN MEDOXOMIL-HCTZ 20-12.5 MG PO TABS
1.0000 | ORAL_TABLET | Freq: Every day | ORAL | 0 refills | Status: DC
Start: 2020-07-02 — End: 2020-11-07

## 2020-07-02 MED ORDER — ATORVASTATIN CALCIUM 40 MG PO TABS: 40.0000 mg | ORAL_TABLET | Freq: Every day | ORAL | 1 refills | Status: DC

## 2020-07-18 ENCOUNTER — Other Ambulatory Visit: Payer: Self-pay | Admitting: *Deleted

## 2020-07-18 DIAGNOSIS — D6851 Activated protein C resistance: Secondary | ICD-10-CM

## 2020-07-18 DIAGNOSIS — I81 Portal vein thrombosis: Secondary | ICD-10-CM

## 2020-07-18 MED ORDER — RIVAROXABAN 20 MG PO TABS: 20.0000 mg | ORAL_TABLET | Freq: Every day | ORAL | 0 refills | Status: DC

## 2020-07-24 LAB — HM DIABETES EYE EXAM

## 2020-08-28 ENCOUNTER — Other Ambulatory Visit: Payer: Self-pay | Admitting: Family Medicine

## 2020-08-28 DIAGNOSIS — J3089 Other allergic rhinitis: Secondary | ICD-10-CM

## 2020-08-28 NOTE — Telephone Encounter (Signed)
Rx denied- Rx 07/02/20 #90 1RF- should be on file at pharmacy

## 2020-09-04 ENCOUNTER — Other Ambulatory Visit: Payer: Self-pay | Admitting: Family Medicine

## 2020-09-04 DIAGNOSIS — J3089 Other allergic rhinitis: Secondary | ICD-10-CM

## 2020-10-02 ENCOUNTER — Ambulatory Visit: Payer: Medicare PPO | Admitting: Dermatology

## 2020-10-02 ENCOUNTER — Other Ambulatory Visit: Payer: Self-pay

## 2020-10-02 DIAGNOSIS — L570 Actinic keratosis: Secondary | ICD-10-CM

## 2020-10-02 DIAGNOSIS — L578 Other skin changes due to chronic exposure to nonionizing radiation: Secondary | ICD-10-CM

## 2020-10-02 NOTE — Progress Notes (Signed)
   Follow-Up Visit   Subjective  Emily Pitts is a 65 y.o. female who presents for the following: Follow-up (3 mo f/u for AKs treated with Ln2 at last visit. Pt states that areas are doing well, they are a little itchy. ).  The following portions of the chart were reviewed this encounter and updated as appropriate:  Tobacco  Allergies  Meds  Problems  Med Hx  Surg Hx  Fam Hx      Review of Systems: No other skin or systemic complaints except as noted in HPI or Assessment and Plan.   Objective  Well appearing patient in no apparent distress; mood and affect are within normal limits.  A focused examination was performed including left arm, right hand, right calf. Relevant physical exam findings are noted in the Assessment and Plan.  Left Upper Arm x1, right dorsal hand x3, right calf x1 Clear today  Assessment & Plan  AK (actinic keratosis) Left Upper Arm x1, right dorsal hand x3, right calf x1  Clear today - Monitor for recurrence  Actinic keratoses are precancerous spots that appear secondary to cumulative UV radiation exposure/sun exposure over time. They are chronic with expected duration over 1 year. A portion of actinic keratoses will progress to squamous cell carcinoma of the skin. It is not possible to reliably predict which spots will progress to skin cancer and so treatment is recommended to prevent development of skin cancer.  Recommend daily broad spectrum sunscreen SPF 30+ to sun-exposed areas, reapply every 2 hours as needed.  Recommend staying in the shade or wearing long sleeves, sun glasses (UVA+UVB protection) and wide brim hats (4-inch brim around the entire circumference of the hat). Call for new or changing lesions.   Actinic Damage - chronic, secondary to cumulative UV radiation exposure/sun exposure over time - diffuse scaly erythematous macules with underlying dyspigmentation - Recommend daily broad spectrum sunscreen SPF 30+ to sun-exposed areas,  reapply every 2 hours as needed.  - Recommend staying in the shade or wearing long sleeves, sun glasses (UVA+UVB protection) and wide brim hats (4-inch brim around the entire circumference of the hat). - Call for new or changing lesions.  Return for as scheduled for TBSE.  I, Harriett Sine, CMA, am acting as scribe for Forest Gleason, MD.  Documentation: I have reviewed the above documentation for accuracy and completeness, and I agree with the above.  Forest Gleason, MD

## 2020-10-02 NOTE — Patient Instructions (Addendum)
Recommend Sarna or Goldbond Rapid Relief. Apply to biopsy site 1-3 times per day for itching.   Recommend taking Heliocare sun protection supplement daily in sunny weather for additional sun protection. For maximum protection on the sunniest days, you can take up to 2 capsules of regular Heliocare OR take 1 capsule of Heliocare Ultra. For prolonged exposure (such as a full day in the sun), you can repeat your dose of the supplement 4 hours after your first dose. Heliocare can be purchased at [facility] or at [email or web address].     Melanoma ABCDEs  Melanoma is the most dangerous type of skin cancer, and is the leading cause of death from skin disease.  You are more likely to develop melanoma if you: Have light-colored skin, light-colored eyes, or red or blond hair Spend a lot of time in the sun Tan regularly, either outdoors or in a tanning bed Have had blistering sunburns, especially during childhood Have a close family member who has had a melanoma Have atypical moles or large birthmarks  Early detection of melanoma is key since treatment is typically straightforward and cure rates are extremely high if we catch it early.   The first sign of melanoma is often a change in a mole or a new dark spot.  The ABCDE system is a way of remembering the signs of melanoma.  A for asymmetry:  The two halves do not match. B for border:  The edges of the growth are irregular. C for color:  A mixture of colors are present instead of an even brown color. D for diameter:  Melanomas are usually (but not always) greater than 65m - the size of a pencil eraser. E for evolution:  The spot keeps changing in size, shape, and color.  Please check your skin once per month between visits. You can use a small mirror in front and a large mirror behind you to keep an eye on the back side or your body.   If you see any new or changing lesions before your next follow-up, please call to schedule a  visit.  Please continue daily skin protection including broad spectrum sunscreen SPF 30+ to sun-exposed areas, reapplying every 2 hours as needed when you're outdoors.   Staying in the shade or wearing long sleeves, sun glasses (UVA+UVB protection) and wide brim hats (4-inch brim around the entire circumference of the hat) are also recommended for sun protection.

## 2020-10-13 ENCOUNTER — Encounter: Payer: Self-pay | Admitting: Dermatology

## 2020-10-15 ENCOUNTER — Other Ambulatory Visit: Payer: Self-pay | Admitting: Oncology

## 2020-10-15 DIAGNOSIS — D6851 Activated protein C resistance: Secondary | ICD-10-CM

## 2020-10-15 DIAGNOSIS — I81 Portal vein thrombosis: Secondary | ICD-10-CM

## 2020-10-16 ENCOUNTER — Encounter: Payer: Self-pay | Admitting: Hematology and Oncology

## 2020-10-16 NOTE — Telephone Encounter (Signed)
Sonia Baller - will you consider RF request. Pt doesn't have apt with Dr. Jacinto Reap until 8/30. We have not seen the patient. Former Dance movement psychotherapist patient

## 2020-11-04 ENCOUNTER — Other Ambulatory Visit: Payer: Self-pay | Admitting: Family Medicine

## 2020-11-04 ENCOUNTER — Other Ambulatory Visit: Payer: Self-pay | Admitting: *Deleted

## 2020-11-04 DIAGNOSIS — D509 Iron deficiency anemia, unspecified: Secondary | ICD-10-CM

## 2020-11-04 DIAGNOSIS — E785 Hyperlipidemia, unspecified: Secondary | ICD-10-CM

## 2020-11-05 ENCOUNTER — Other Ambulatory Visit: Payer: BC Managed Care – PPO

## 2020-11-05 ENCOUNTER — Encounter: Payer: Self-pay | Admitting: Oncology

## 2020-11-05 ENCOUNTER — Inpatient Hospital Stay: Payer: Medicare PPO | Attending: Internal Medicine

## 2020-11-05 ENCOUNTER — Ambulatory Visit: Payer: Medicare PPO | Admitting: Oncology

## 2020-11-05 ENCOUNTER — Inpatient Hospital Stay: Payer: Medicare PPO | Admitting: Oncology

## 2020-11-05 VITALS — BP 138/72 | HR 90 | Temp 98.2°F | Resp 18 | Wt 216.0 lb

## 2020-11-05 DIAGNOSIS — Z7901 Long term (current) use of anticoagulants: Secondary | ICD-10-CM | POA: Insufficient documentation

## 2020-11-05 DIAGNOSIS — E860 Dehydration: Secondary | ICD-10-CM | POA: Diagnosis not present

## 2020-11-05 DIAGNOSIS — K219 Gastro-esophageal reflux disease without esophagitis: Secondary | ICD-10-CM | POA: Insufficient documentation

## 2020-11-05 DIAGNOSIS — I81 Portal vein thrombosis: Secondary | ICD-10-CM | POA: Insufficient documentation

## 2020-11-05 DIAGNOSIS — Z79899 Other long term (current) drug therapy: Secondary | ICD-10-CM | POA: Insufficient documentation

## 2020-11-05 DIAGNOSIS — E669 Obesity, unspecified: Secondary | ICD-10-CM | POA: Diagnosis not present

## 2020-11-05 DIAGNOSIS — Z7984 Long term (current) use of oral hypoglycemic drugs: Secondary | ICD-10-CM | POA: Diagnosis not present

## 2020-11-05 DIAGNOSIS — D509 Iron deficiency anemia, unspecified: Secondary | ICD-10-CM | POA: Insufficient documentation

## 2020-11-05 DIAGNOSIS — E785 Hyperlipidemia, unspecified: Secondary | ICD-10-CM | POA: Insufficient documentation

## 2020-11-05 DIAGNOSIS — D6851 Activated protein C resistance: Secondary | ICD-10-CM

## 2020-11-05 DIAGNOSIS — K5900 Constipation, unspecified: Secondary | ICD-10-CM | POA: Diagnosis not present

## 2020-11-05 DIAGNOSIS — Z7982 Long term (current) use of aspirin: Secondary | ICD-10-CM | POA: Diagnosis not present

## 2020-11-05 DIAGNOSIS — I10 Essential (primary) hypertension: Secondary | ICD-10-CM | POA: Diagnosis not present

## 2020-11-05 DIAGNOSIS — E119 Type 2 diabetes mellitus without complications: Secondary | ICD-10-CM | POA: Diagnosis not present

## 2020-11-05 LAB — CBC WITH DIFFERENTIAL/PLATELET
Abs Immature Granulocytes: 0.02 10*3/uL (ref 0.00–0.07)
Basophils Absolute: 0 10*3/uL (ref 0.0–0.1)
Basophils Relative: 0 %
Eosinophils Absolute: 0.1 10*3/uL (ref 0.0–0.5)
Eosinophils Relative: 1 %
HCT: 40.1 % (ref 36.0–46.0)
Hemoglobin: 13.1 g/dL (ref 12.0–15.0)
Immature Granulocytes: 0 %
Lymphocytes Relative: 26 %
Lymphs Abs: 2.1 10*3/uL (ref 0.7–4.0)
MCH: 28.1 pg (ref 26.0–34.0)
MCHC: 32.7 g/dL (ref 30.0–36.0)
MCV: 86.1 fL (ref 80.0–100.0)
Monocytes Absolute: 0.6 10*3/uL (ref 0.1–1.0)
Monocytes Relative: 8 %
Neutro Abs: 5.1 10*3/uL (ref 1.7–7.7)
Neutrophils Relative %: 65 %
Platelets: 245 10*3/uL (ref 150–400)
RBC: 4.66 MIL/uL (ref 3.87–5.11)
RDW: 14.2 % (ref 11.5–15.5)
WBC: 7.9 10*3/uL (ref 4.0–10.5)
nRBC: 0 % (ref 0.0–0.2)

## 2020-11-05 LAB — IRON AND TIBC
Iron: 52 ug/dL (ref 28–170)
Saturation Ratios: 14 % (ref 10.4–31.8)
TIBC: 375 ug/dL (ref 250–450)
UIBC: 323 ug/dL

## 2020-11-05 LAB — FERRITIN: Ferritin: 19 ng/mL (ref 11–307)

## 2020-11-05 NOTE — Progress Notes (Signed)
IDA follow up care. Feels well. Taking iron gummies daily. Stools dark sometimes because of iron intake. Occ lightheadedness.

## 2020-11-05 NOTE — Progress Notes (Signed)
Emily Pitts  [address], [address] [place], [place] [address] Phone: [phone]  Fax: [phone]   Clinic Day:  11/05/2020  Referring physician: Steele Sizer, MD  Chief Complaint: Factor V Leiden on Xarelto  HPI: Emily Pitts is a 65 year old female with past medical history significant for hypertension, portal vein thrombus on Xarelto, GERD, diabetes, IDA, dyslipidemia, factor V Leiden and obesity who is followed by hematology annually.  Emily Pitts was last seen in clinic on 11/06/2019.  Emily Pitts receives IV Venofer intermittently for her IDA.  Emily Pitts last received this on 11/29/2018.  Emily Pitts takes OTC oral iron daily.  Presented back in August 2016 with fever, chills, dehydration, weight loss and abdominal pain for about 4 weeks.  CT on 11/07/2014 revealed a partially thrombosed portal veins (left > right).  Emily main portal vein and mesenteric veins remained patent.  Porta hepatis nodes measured 1.2 cm in short axis (stable).   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     Additional testing on 11/27/2014 revealed resolution of anemia.     Emily Pitts was started on Xarelto on 11/08/2014.  Plan is for long term anticoagulation given Emily unprovoked thrombus.   Ultrasound hepatic doppler on 05/02/2015 revealed no convincing evidence of occlusion or thrombus.  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    Emily Pitts denies any prior history of thrombosis.  Emily Pitts has not been on birth control pills.  Emily Pitts denies any family history of thrombosis.    Colonoscopy on 06/07/2013 revealed only diverticulosis.  Emily Pitts has iron deficiency. Emily Pitts received Venofer weekly x 3 (11/15/2018 - 11/29/2018).  Colonoscopy on 06/07/2013 revealed erythematous mucosa and diverticulosis in Emily sigmoid colon.  Pathology revealed mild vascular congestion in Emily colonic mucosa.  Emily Pitts received Emily [place]  COVID-19 vaccine on 05/30/2019 and 06/20/2019.  INTERVAL HISTORY: In Emily interim, Emily Pitts has been seen by dermatology for several actinic keratosis (left cheek and right shin).  Emily Pitts also has several inflamed seborrheic keratosis and AK which required cryotherapy.   Emily Pitts has been evaluated by her PCP Dr. Ancil Boozer for chronic conditions.  Appears stable.  Emily Pitts tested positive for COVID in January 2022.  Recovered well.  Today, Emily Pitts reports doing well.  Denies any new concerns.  Tolerating oral iron with mild constipation.  Takes MiraLAX.  Has been compliant with Xarelto.  Had a weekly episode of rectal bleeding In jan/feb and was evaluated by Dr. Bary Castilla who felt it was related to known diverticulitis and constipation.  Emily Pitts did not require a colonoscopy and symptoms resolved spontaneously.  Emily Pitts has not had any additional episodes.   Past Medical History:  Diagnosis Date   Allergy    Diabetes mellitus without complication ([place]) 0000000   Hyperlipidemia    Hypertension    Low back pain, episodic    Numbness of feet    Vitamin D deficiency     Past Surgical History:  Procedure Laterality Date   ABDOMINAL HYSTERECTOMY  2010   APPENDECTOMY  1966   COLONOSCOPY  06-07-13   Dr Bary Castilla   DILATION AND CURETTAGE OF UTERUS     OOPHORECTOMY      Family History  Problem Relation Age of Onset   Diabetes Mother    COPD Mother    Diabetes Father    Hypertension Father    Cancer Father        Kidney and Prostate   CAD  Father    Diabetes Brother        Oldest Brother   Kidney disease Brother        Tumor removed   Breast cancer Neg Hx     Social History:  reports that Emily Pitts has never smoked. Emily Pitts has never used smokeless tobacco. Emily Pitts reports that Emily Pitts does not drink alcohol and does not use drugs. Emily Pitts does not have any children. Emily Pitts plans on retiring at Emily end of 2021. Emily Pitts lives in [place]. Emily Pitts is alone today.  Allergies: No Known Allergies  Current Medications: Current Outpatient  Medications  Medication Sig Dispense Refill   aspirin EC 81 MG tablet Take 81 mg by mouth daily.     atorvastatin (LIPITOR) 40 MG tablet Take 1 tablet (40 mg total) by mouth daily. 90 tablet 1   azelastine (ASTELIN) 0.1 % nasal spray Place 2 sprays into both nostrils 2 (two) times daily. Use in each nostril as directed 30 mL 2   cholecalciferol (VITAMIN D) 1000 units tablet Take 500 Units by mouth daily.      fluticasone (FLONASE) 50 MCG/ACT nasal spray USE 2 SPRAY(S) IN EACH NOSTRIL AS NEEDED 48 g 0   glucose blood (ONE TOUCH ULTRA TEST) test strip USE AS DIRECTED 100 each 12   Iron-FA-B Cmp-C-Biot-Probiotic (FUSION PLUS PO) Take 1 tablet by mouth daily. Vitamin with iron     levocetirizine (XYZAL) 5 MG tablet Take 1 tablet (5 mg total) by mouth daily. 90 tablet 1   MAGNESIUM OXIDE PO Take 500 mg by mouth daily.      metFORMIN (GLUCOPHAGE-XR) 750 MG 24 hr tablet Take 2 tablets (1,500 mg total) by mouth daily with breakfast. 180 tablet 1   montelukast (SINGULAIR) 10 MG tablet TAKE 1 TABLET BY MOUTH AT BEDTIME 90 tablet 0   olmesartan-hydrochlorothiazide (BENICAR HCT) 20-12.5 MG tablet Take 1 tablet by mouth daily. 90 tablet 0   polyethylene glycol powder (GLYCOLAX/MIRALAX) powder Take 17 g by mouth 2 (two) times daily. 3350 g 1   tiZANidine (ZANAFLEX) 2 MG tablet Take 1 tablet (2 mg total) by mouth every 6 (six) hours as needed for muscle spasms. 30 tablet 0   vitamin B-12 (CYANOCOBALAMIN) 500 MCG tablet Take 1,000 mcg by mouth daily.     XARELTO 20 MG TABS tablet TAKE 1 TABLET BY MOUTH ONCE DAILY WITH SUPPER 90 tablet 0   Turmeric 500 MG TABS Take 1 tablet by mouth daily.     No current facility-administered medications for this visit.    Review of Systems  Constitutional: Negative.  Negative for chills, fever, malaise/fatigue and weight loss.  HENT:  Negative for congestion, ear pain and tinnitus.   Eyes: Negative.  Negative for blurred vision and double vision.  Respiratory: Negative.   Negative for cough, sputum production and shortness of breath.   Cardiovascular: Negative.  Negative for chest pain, palpitations and leg swelling.  Gastrointestinal:  Positive for constipation. Negative for abdominal pain, diarrhea, nausea and vomiting.  Genitourinary:  Negative for dysuria, frequency and urgency.  Musculoskeletal:  Negative for back pain and falls.  Skin: Negative.  Negative for rash.  Neurological: Negative.  Negative for weakness and headaches.  Endo/Heme/Allergies: Negative.  Does not bruise/bleed easily.  Psychiatric/Behavioral: Negative.  Negative for depression. Emily Pitts is not nervous/anxious and does not have insomnia.   Performance status (ECOG): 0  Vitals Blood pressure 138/72, pulse 90, temperature 98.2 F (36.8 C), temperature source Tympanic, resp. rate 18, weight 216  lb (98 kg), SpO2 99 %.   Physical Exam Constitutional:      Appearance: Emily Pitts is well-developed.  HENT:     Head: Normocephalic and atraumatic.  Eyes:     Pupils: Pupils are equal, round, and reactive to light.  Cardiovascular:     Rate and Rhythm: Normal rate and regular rhythm.     Heart sounds: No murmur heard. Pulmonary:     Effort: Pulmonary effort is normal.     Breath sounds: Normal breath sounds. No wheezing.  Abdominal:     General: Bowel sounds are normal. There is no distension.     Palpations: Abdomen is soft. There is no mass.     Tenderness: There is no abdominal tenderness.  Musculoskeletal:        General: Normal range of motion.     Cervical back: Normal range of motion.  Skin:    General: Skin is warm and dry.  Neurological:     Mental Status: Emily Pitts is alert and oriented to person, place, and time.  Psychiatric:        Behavior: Behavior normal.   Appointment on 11/05/2020  Component Date Value Ref Range Status   WBC 11/05/2020 7.9  4.0 - 10.5 K/uL Final   RBC 11/05/2020 4.66  3.87 - 5.11 MIL/uL Final   Hemoglobin 11/05/2020 13.1  12.0 - 15.0 g/dL Final    HCT 11/05/2020 40.1  36.0 - 46.0 % Final   MCV 11/05/2020 86.1  80.0 - 100.0 fL Final   MCH 11/05/2020 28.1  26.0 - 34.0 pg Final   MCHC 11/05/2020 32.7  30.0 - 36.0 g/dL Final   RDW 11/05/2020 14.2  11.5 - 15.5 % Final   Platelets 11/05/2020 245  150 - 400 K/uL Final   nRBC 11/05/2020 0.0  0.0 - 0.2 % Final   Neutrophils Relative % 11/05/2020 65  % Final   Neutro Abs 11/05/2020 5.1  1.7 - 7.7 K/uL Final   Lymphocytes Relative 11/05/2020 26  % Final   Lymphs Abs 11/05/2020 2.1  0.7 - 4.0 K/uL Final   Monocytes Relative 11/05/2020 8  % Final   Monocytes Absolute 11/05/2020 0.6  0.1 - 1.0 K/uL Final   Eosinophils Relative 11/05/2020 1  % Final   Eosinophils Absolute 11/05/2020 0.1  0.0 - 0.5 K/uL Final   Basophils Relative 11/05/2020 0  % Final   Basophils Absolute 11/05/2020 0.0  0.0 - 0.1 K/uL Final   Immature Granulocytes 11/05/2020 0  % Final   Abs Immature Granulocytes 11/05/2020 0.02  0.00 - 0.07 K/uL Final   Performed at [facility], [address] [place], [place] [address]    Assessment/Plan:    Portal vein thrombus: Clinically Emily Pitts is doing well on Xarelto.  Plan is to continue anticoagulation long-term.  Factor V Leiden positive.  Had an episode of rectal bleeding in January 2022 which was self-limiting.  Emily Pitts saw Dr. Bary Castilla in May 2022 and rectal bleeding was thought to be secondary to constipation, diverticulitis flare exacerbated by anticoagulation.  No additional episodes since.  Iron deficiency anemia: Emily Pitts receives IV iron intermittently.  Emily Pitts last received IV Venofer on 11/29/2018.  Emily Pitts is tolerating OTC oral iron Gummies well.  Has mild constipation but does take MiraLAX.  Labs from 11/05/2020 show a hemoglobin of 13.1.  Iron panel and ferritin are pending.  Goal ferritin is 50-100.  Her last ferritin was 23 on 05/06/2020.  Emily Pitts prefers oral iron tablets due to cost of IV  Venofer.  Continue oral iron at this time.  Disposition: Given Pitts has had stable labs for  Emily past several years, Emily Pitts likely can be discharged from our clinic with close follow-up with her PCP and Dr. Bary Castilla.  Pitts is in agreement.  I spent 20 minutes dedicated to Emily care of this Pitts (face-to-face and non-face-to-face) on Emily date of Emily encounter to include what is described in Emily assessment and plan.   Faythe Casa, NP 11/05/2020 10:09 AM   CC-Dr. Rogue Bussing and Dr. Ancil Boozer

## 2020-11-06 ENCOUNTER — Other Ambulatory Visit: Payer: Self-pay | Admitting: Family Medicine

## 2020-11-06 DIAGNOSIS — E785 Hyperlipidemia, unspecified: Secondary | ICD-10-CM

## 2020-11-07 ENCOUNTER — Other Ambulatory Visit: Payer: Self-pay | Admitting: Family Medicine

## 2020-11-07 DIAGNOSIS — I10 Essential (primary) hypertension: Secondary | ICD-10-CM

## 2020-11-07 NOTE — Telephone Encounter (Signed)
Last appt 4/26 you told her to f/u in 6 months

## 2020-11-07 NOTE — Telephone Encounter (Signed)
Next OV 01/02/21

## 2020-12-09 ENCOUNTER — Other Ambulatory Visit: Payer: Self-pay | Admitting: Family Medicine

## 2020-12-09 DIAGNOSIS — J302 Other seasonal allergic rhinitis: Secondary | ICD-10-CM

## 2021-01-01 NOTE — Progress Notes (Signed)
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 Chronic nocturia at least once at night - she drinks a lot of water in the evenings. She is on ARB , she has numbness on both feet , last B12 towards low end of normal and has been taking B12 SL but forgets to take it daily , we will recheck labs today. She has a history of microalbuminuria and is taking ARB, dyslipidemia and takes statin therapy    Obesity:  she retired, , down another  8 lbs, she is still walking her dog, also playing bat mitten now, has not been riding her bike lately.    HTN: she is on Benicar hctz  she is taking one pill daily, no chest pain, palpitation, SOB or dizziness BP is at goal. Continue current regiment     Hyperlipidemia: she is now on Atorvastatin and denies side effects .LDL is at goal, good cholesterol was a little low, she has been more active since last visit, has been eating almonds but has not been eating fish  We will recheck labs today    GERD: very rare now, takes Tums prn    Perennial AR with seasonal variation: She is on singulair, xyzal , Flonase and Astelin, she states always has rhinorrhea since pollen is count is high , she also has symptmos during Fall and she has noticed some rhinorrhea and sneezing.    Portal Vein Thrombosis: saw hematologist but recently released from their care, she is still on Xarelto, she will be on that therapy for a life time, she denies any bleeding.  She has  factor V leiden . She was diagnosed with iron deficiency anemia of unknown cause, but HCT has been stable, no longer infusions but taking otc supplementation She had some blood in stools earlier this year and is due for EGD  and colonoscopy , she has been released from Dr. Fleet Contras since no recent episode of bleeding    Constipation:  [place] usually a 4 with medication. She takes Miralax prn only   Right knee pain: she states she saw Ortho years ago, states pain has been constant now, also has noticed some effusion but no redness or increase in warmth. She has been playing bat mitten and being active makes it worse. She has been taking Tylenol prn   Patient Active Problem List   Diagnosis Date Noted   Iron deficiency anemia 02/14/2018   Chondromalacia, right knee 10/30/2016   Long term current use of anticoagulant therapy 01/05/2016   Factor 5 Leiden mutation, heterozygous ([place]) 05/02/2015   Portal vein thrombosis 11/07/2014   History of sepsis 10/10/2014   Abnormal electrocardiogram 10/08/2014   Nonspecific abnormal electromyogram (EMG) 10/08/2014   Benign essential HTN 10/08/2014   Diabetes mellitus with renal manifestation ([place]) 10/08/2014   Dyslipidemia 10/08/2014   LBP (low back pain) 10/08/2014   Gastro-esophageal reflux disease without esophagitis 10/08/2014   Microalbuminuria 10/08/2014   Disturbance of skin sensation 10/08/2014   Obesity (BMI 30-39.9) 10/08/2014   Perennial allergic rhinitis with seasonal variation 10/08/2014   Plantar fasciitis 10/08/2014   Postablative ovarian failure 10/08/2014   Menopausal symptom  10/08/2014   Vitamin D deficiency 10/08/2014    Past Surgical History:  Procedure Laterality Date   ABDOMINAL HYSTERECTOMY  2010   APPENDECTOMY  1966   COLONOSCOPY  06-07-13   Dr Bary Castilla   DILATION AND CURETTAGE OF UTERUS     OOPHORECTOMY      Family History  Problem Relation Age of Onset   Diabetes Mother    COPD Mother    Diabetes Father    Hypertension Father    Cancer Father        Kidney and Prostate   CAD Father    Diabetes Brother        Oldest Brother   Kidney disease Brother        Tumor removed   Breast cancer Neg Hx     Social History   Tobacco  Use   Smoking status: Never   Smokeless tobacco: Never  Substance Use Topics   Alcohol use: No    Alcohol/week: 0.0 standard drinks     Current Outpatient Medications:    azelastine (ASTELIN) 0.1 % nasal spray, Place 2 sprays into both nostrils 2 (two) times daily. Use in each nostril as directed, Disp: 30 mL, Rfl: 2   cholecalciferol (VITAMIN D) 1000 units tablet, Take 500 Units by mouth daily. , Disp: , Rfl:    fluticasone (FLONASE) 50 MCG/ACT nasal spray, USE 2 SPRAY(S) IN EACH NOSTRIL AS NEEDED, Disp: 48 g, Rfl: 0   glucose blood (ONE TOUCH ULTRA TEST) test strip, USE AS DIRECTED, Disp: 100 each, Rfl: 12   Iron-FA-B Cmp-C-Biot-Probiotic (FUSION PLUS PO), Take 1 tablet by mouth daily. Vitamin with iron, Disp: , Rfl:    MAGNESIUM OXIDE PO, Take 500 mg by mouth daily. , Disp: , Rfl:    metFORMIN (GLUCOPHAGE-XR) 750 MG 24 hr tablet, Take 2 tablets (1,500 mg total) by mouth daily with breakfast., Disp: 180 tablet, Rfl: 1   polyethylene glycol powder (GLYCOLAX/MIRALAX) powder, Take 17 g by mouth 2 (two) times daily., Disp: 3350 g, Rfl: 1   tiZANidine (ZANAFLEX) 2 MG tablet, Take 1 tablet (2 mg total) by mouth every 6 (six) hours as needed for muscle spasms., Disp: 30 tablet, Rfl: 0   vitamin B-12 (CYANOCOBALAMIN) 500 MCG tablet, Take 1,000 mcg by mouth daily., Disp: , Rfl:    atorvastatin (LIPITOR) 40 MG tablet, Take 1 tablet (40 mg total) by mouth daily., Disp: 90 tablet, Rfl: 3   levocetirizine (XYZAL) 5 MG tablet, Take 1 tablet (5 mg total) by mouth daily., Disp: 90 tablet, Rfl: 1   montelukast (SINGULAIR) 10 MG tablet, Take 1 tablet (10 mg total) by mouth at bedtime., Disp: 90 tablet, Rfl: 1   olmesartan-hydrochlorothiazide (BENICAR HCT) 20-12.5 MG tablet, Take 1 tablet by mouth daily., Disp: 90 tablet, Rfl: 1   rivaroxaban (XARELTO) 20 MG TABS tablet, Take 1 tablet (20 mg total) by mouth daily with supper., Disp: 90 tablet, Rfl: 3  No Known Allergies  I personally reviewed active  problem list, medication list, allergies, family history, social history, health maintenance with the patient/caregiver today.   ROS  Constitutional: Negative for fever, positive for  weight change.  Respiratory: Negative for cough and shortness of breath.   Cardiovascular: Negative for chest pain or palpitations.  Gastrointestinal: Negative for abdominal pain, no bowel changes.  Musculoskeletal: positive for gait problem and right knee  joint swelling.  Skin: Negative for rash.  Neurological: Negative for dizziness or headache.  No other specific complaints in a complete review  of systems (except as listed in HPI above).   Objective  Vitals:   01/02/21 0738  BP: 128/70  Pulse: 86  Resp: 16  Temp: 98.1 F (36.7 C)  SpO2: 98%  Weight: 214 lb (97.1 kg)  Height: 5\' 8"  (1.727 m)    Body mass index is 32.54 kg/m.  Physical Exam  Constitutional: Patient appears well-developed and well-nourished. Obese  No distress.  HEENT: head atraumatic, normocephalic, pupils equal and reactive to light, neck supple Cardiovascular: Normal rate, regular rhythm and normal heart sounds.  No murmur heard. No BLE edema. Pulmonary/Chest: Effort normal and breath sounds normal. No respiratory distress. Abdominal: Soft.  There is no tenderness. Muscular Skeletal: effusion right knee, decrease flexion of right knee  Psychiatric: Patient has a normal mood and affect. behavior is normal. Judgment and thought content normal.   Recent Results (from the past 2160 hour(s))  Iron and TIBC     Status: None   Collection Time: 11/05/20  9:23 AM  Result Value Ref Range   Iron 52 28 - 170 ug/dL   TIBC 375 250 - 450 ug/dL   Saturation Ratios 14 10.4 - 31.8 %   UIBC 323 ug/dL    Comment: Performed at [facility], [address] [place], [place] [address]  Ferritin     Status: None   Collection Time: 11/05/20  9:23 AM  Result Value Ref Range   Ferritin 19 11 - 307 ng/mL    Comment: Performed at  [facility], [address] [place], [place] [address]  CBC with Differential     Status: None   Collection Time: 11/05/20  9:23 AM  Result Value Ref Range   WBC 7.9 4.0 - 10.5 K/uL   RBC 4.66 3.87 - 5.11 MIL/uL   Hemoglobin 13.1 12.0 - 15.0 g/dL   HCT 40.1 36.0 - 46.0 %   MCV 86.1 80.0 - 100.0 fL   MCH 28.1 26.0 - 34.0 pg   MCHC 32.7 30.0 - 36.0 g/dL   RDW 14.2 11.5 - 15.5 %   Platelets 245 150 - 400 K/uL   nRBC 0.0 0.0 - 0.2 %   Neutrophils Relative % 65 %   Neutro Abs 5.1 1.7 - 7.7 K/uL   Lymphocytes Relative 26 %   Lymphs Abs 2.1 0.7 - 4.0 K/uL   Monocytes Relative 8 %   Monocytes Absolute 0.6 0.1 - 1.0 K/uL   Eosinophils Relative 1 %   Eosinophils Absolute 0.1 0.0 - 0.5 K/uL   Basophils Relative 0 %   Basophils Absolute 0.0 0.0 - 0.1 K/uL   Immature Granulocytes 0 %   Abs Immature Granulocytes 0.02 0.00 - 0.07 K/uL    Comment: Performed at [facility], [place], [place] [address]  POCT HgB A1C     Status: Abnormal   Collection Time: 01/02/21  7:48 AM  Result Value Ref Range   Hemoglobin A1C 6.1 (A) 4.0 - 5.6 %   HbA1c POC (<> result, manual entry)     HbA1c, POC (prediabetic range)     HbA1c, POC (controlled diabetic range)       PHQ2/9: Depression screen [facility] 2/9 01/02/2021 07/02/2020 02/29/2020 11/06/2019 05/05/2019  Decreased Interest 0 0 0 0 0  Down, Depressed, Hopeless 0 0 0 0 0  PHQ - 2 Score 0 0 0 0 0  Altered sleeping 0 - - 3 3  Tired, decreased energy 0 - - 2 0  Change in appetite  0 - - 2 0  Feeling bad or failure about yourself  0 - - 0 0  Trouble concentrating 0 - - 0 0  Moving slowly or fidgety/restless 0 - - 0 0  Suicidal thoughts 0 - - 0 0  PHQ-9 Score 0 - - 7 3  Difficult doing work/chores - - - Not difficult at all Not difficult at all  Some recent data might be hidden    phq 9 is negative   Fall Risk: Fall Risk  01/02/2021 07/02/2020 02/29/2020 11/06/2019 05/05/2019  Falls in the past year? 1 0 0 0 0  Number  falls in past yr: 0 0 0 0 0  Injury with Fall? 0 0 0 0 0  Risk for fall due to : No Fall Risks - - - -  Follow up Falls prevention discussed - - - -      Functional Status Survey: Is the patient deaf or have difficulty hearing?: No Does the patient have difficulty seeing, even when wearing glasses/contacts?: No Does the patient have difficulty concentrating, remembering, or making decisions?: No Does the patient have difficulty walking or climbing stairs?: No Does the patient have difficulty dressing or bathing?: No Does the patient have difficulty doing errands alone such as visiting a doctor's office or shopping?: No    Assessment & Plan  1. Type 2 diabetes mellitus with microalbuminuria, without long-term current use of insulin (HCC)  - POCT HgB A1C - HM Diabetes Foot Exam - Microalbumin / creatinine urine ratio  2. Hypertension associated with diabetes ([place])  - COMPLETE METABOLIC PANEL WITH GFR  3. Need for immunization against influenza  - Flu Vaccine QUAD High Dose(Flu[place])  4. Microalbuminuria  - Microalbumin / creatinine urine ratio  5. Factor 5 Leiden mutation, heterozygous ([place])  - rivaroxaban (XARELTO) 20 MG TABS tablet; Take 1 tablet (20 mg total) by mouth daily with supper.  Dispense: 90 tablet; Refill: 3  6. Gastro-esophageal reflux disease without esophagitis   7. History of iron deficiency   8. Vitamin D deficiency  - VITAMIN D 25 Hydroxy (Vit-D Deficiency, Fractures)  9. Benign essential HTN  - olmesartan-hydrochlorothiazide (BENICAR HCT) 20-12.5 MG tablet; Take 1 tablet by mouth daily.  Dispense: 90 tablet; Refill: 1  10. Obesity (BMI 30-39.9)  Discussed with the patient the risk posed by an increased BMI. Discussed importance of portion control, calorie counting and at least 150 minutes of physical activity weekly. Avoid sweet beverages and drink more water. Eat at least 6 servings of fruit and vegetables daily    11. High risk medication  use   12. Dyslipidemia  - Lipid panel - atorvastatin (LIPITOR) 40 MG tablet; Take 1 tablet (40 mg total) by mouth daily.  Dispense: 90 tablet; Refill: 3  13. Low serum vitamin B12  - B12 and Folate Panel  14. Perennial allergic rhinitis with seasonal variation  - levocetirizine (XYZAL) 5 MG tablet; Take 1 tablet (5 mg total) by mouth daily.  Dispense: 90 tablet; Refill: 1 - montelukast (SINGULAIR) 10 MG tablet; Take 1 tablet (10 mg total) by mouth at bedtime.  Dispense: 90 tablet; Refill: 1  15. Portal vein thrombosis  - rivaroxaban (XARELTO) 20 MG TABS tablet; Take 1 tablet (20 mg total) by mouth daily with supper.  Dispense: 90 tablet; Refill: 3   16. Chronic pain of right knee  - Ambulatory referral to Orthopedic Surgery   17. Controlled type 2 diabetes with neuropathy (HCC)  - pregabalin (LYRICA) 50 MG capsule;  Take 1-3 capsules (50-150 mg total) by mouth 3 (three) times daily.  Dispense: 90 capsule; Refill: 0

## 2021-01-02 ENCOUNTER — Ambulatory Visit: Payer: Medicare PPO | Admitting: Family Medicine

## 2021-01-02 ENCOUNTER — Encounter: Payer: Self-pay | Admitting: Family Medicine

## 2021-01-02 ENCOUNTER — Other Ambulatory Visit: Payer: Self-pay

## 2021-01-02 VITALS — BP 128/70 | HR 86 | Temp 98.1°F | Resp 16 | Ht 68.0 in | Wt 214.0 lb

## 2021-01-02 DIAGNOSIS — K219 Gastro-esophageal reflux disease without esophagitis: Secondary | ICD-10-CM

## 2021-01-02 DIAGNOSIS — G8929 Other chronic pain: Secondary | ICD-10-CM

## 2021-01-02 DIAGNOSIS — E1129 Type 2 diabetes mellitus with other diabetic kidney complication: Secondary | ICD-10-CM | POA: Diagnosis not present

## 2021-01-02 DIAGNOSIS — E785 Hyperlipidemia, unspecified: Secondary | ICD-10-CM

## 2021-01-02 DIAGNOSIS — E669 Obesity, unspecified: Secondary | ICD-10-CM

## 2021-01-02 DIAGNOSIS — I152 Hypertension secondary to endocrine disorders: Secondary | ICD-10-CM

## 2021-01-02 DIAGNOSIS — J302 Other seasonal allergic rhinitis: Secondary | ICD-10-CM

## 2021-01-02 DIAGNOSIS — E538 Deficiency of other specified B group vitamins: Secondary | ICD-10-CM | POA: Diagnosis not present

## 2021-01-02 DIAGNOSIS — R809 Proteinuria, unspecified: Secondary | ICD-10-CM | POA: Diagnosis not present

## 2021-01-02 DIAGNOSIS — I81 Portal vein thrombosis: Secondary | ICD-10-CM

## 2021-01-02 DIAGNOSIS — Z23 Encounter for immunization: Secondary | ICD-10-CM

## 2021-01-02 DIAGNOSIS — E559 Vitamin D deficiency, unspecified: Secondary | ICD-10-CM | POA: Diagnosis not present

## 2021-01-02 DIAGNOSIS — I10 Essential (primary) hypertension: Secondary | ICD-10-CM

## 2021-01-02 DIAGNOSIS — Z8639 Personal history of other endocrine, nutritional and metabolic disease: Secondary | ICD-10-CM | POA: Diagnosis not present

## 2021-01-02 DIAGNOSIS — E1140 Type 2 diabetes mellitus with diabetic neuropathy, unspecified: Secondary | ICD-10-CM | POA: Diagnosis not present

## 2021-01-02 DIAGNOSIS — D6851 Activated protein C resistance: Secondary | ICD-10-CM

## 2021-01-02 DIAGNOSIS — M25561 Pain in right knee: Secondary | ICD-10-CM

## 2021-01-02 DIAGNOSIS — J3089 Other allergic rhinitis: Secondary | ICD-10-CM

## 2021-01-02 DIAGNOSIS — E1159 Type 2 diabetes mellitus with other circulatory complications: Secondary | ICD-10-CM

## 2021-01-02 DIAGNOSIS — Z79899 Other long term (current) drug therapy: Secondary | ICD-10-CM

## 2021-01-02 LAB — POCT GLYCOSYLATED HEMOGLOBIN (HGB A1C): Hemoglobin A1C: 6.1 % — AB (ref 4.0–5.6)

## 2021-01-02 MED ORDER — RIVAROXABAN 20 MG PO TABS: 20.0000 mg | ORAL_TABLET | Freq: Every day | ORAL | 3 refills | Status: DC

## 2021-01-02 MED ORDER — ATORVASTATIN CALCIUM 40 MG PO TABS: 40.0000 mg | ORAL_TABLET | Freq: Every day | ORAL | 3 refills | Status: DC

## 2021-01-02 MED ORDER — MONTELUKAST SODIUM 10 MG PO TABS: 10.0000 mg | ORAL_TABLET | Freq: Every day | ORAL | 1 refills | Status: DC

## 2021-01-02 MED ORDER — LEVOCETIRIZINE DIHYDROCHLORIDE 5 MG PO TABS: 5.0000 mg | ORAL_TABLET | Freq: Every day | ORAL | 1 refills | Status: DC

## 2021-01-02 MED ORDER — PREGABALIN 50 MG PO CAPS: 50.0000 mg | ORAL_CAPSULE | Freq: Three times a day (TID) | ORAL | 0 refills | Status: DC

## 2021-01-02 MED ORDER — OLMESARTAN MEDOXOMIL-HCTZ 20-12.5 MG PO TABS: 1.0000 | ORAL_TABLET | Freq: Every day | ORAL | 1 refills | Status: DC

## 2021-01-03 LAB — COMPLETE METABOLIC PANEL WITH GFR
AG Ratio: 1.5 (calc) (ref 1.0–2.5)
ALT: 22 U/L (ref 6–29)
AST: 17 U/L (ref 10–35)
Albumin: 4.3 g/dL (ref 3.6–5.1)
Alkaline phosphatase (APISO): 85 U/L (ref 37–153)
BUN: 9 mg/dL (ref 7–25)
CO2: 32 mmol/L (ref 20–32)
Calcium: 10.3 mg/dL (ref 8.6–10.4)
Chloride: 99 mmol/L (ref 98–110)
Creat: 0.68 mg/dL (ref 0.50–1.05)
Globulin: 2.9 g/dL (calc) (ref 1.9–3.7)
Glucose, Bld: 110 mg/dL — ABNORMAL HIGH (ref 65–99)
Potassium: 4.5 mmol/L (ref 3.5–5.3)
Sodium: 140 mmol/L (ref 135–146)
Total Bilirubin: 0.4 mg/dL (ref 0.2–1.2)
Total Protein: 7.2 g/dL (ref 6.1–8.1)
eGFR: 96 mL/min/{1.73_m2} (ref >=60)

## 2021-01-03 LAB — LIPID PANEL
Cholesterol: 128 mg/dL (ref <200)
HDL: 55 mg/dL (ref >=50)
LDL Cholesterol (Calc): 49 mg/dL (calc)
Non-HDL Cholesterol (Calc): 73 mg/dL (calc) (ref <130)
Total CHOL/HDL Ratio: 2.3 (calc) (ref <5.0)
Triglycerides: 161 mg/dL — ABNORMAL HIGH (ref <150)

## 2021-01-03 LAB — B12 AND FOLATE PANEL
Folate: 11.1 ng/mL
Vitamin B-12: 1057 pg/mL (ref 200–1100)

## 2021-01-03 LAB — MICROALBUMIN / CREATININE URINE RATIO
Creatinine, Urine: 18 mg/dL — ABNORMAL LOW (ref 20–275)
Microalb, Ur: <0.2 mg/dL

## 2021-01-03 LAB — VITAMIN D 25 HYDROXY (VIT D DEFICIENCY, FRACTURES): Vit D, 25-Hydroxy: 42 ng/mL (ref 30–100)

## 2021-01-07 ENCOUNTER — Encounter: Payer: Self-pay | Admitting: Family Medicine

## 2021-01-09 ENCOUNTER — Other Ambulatory Visit: Payer: Self-pay | Admitting: Family Medicine

## 2021-01-09 ENCOUNTER — Ambulatory Visit: Payer: Self-pay | Admitting: *Deleted

## 2021-01-09 DIAGNOSIS — E1140 Type 2 diabetes mellitus with diabetic neuropathy, unspecified: Secondary | ICD-10-CM

## 2021-01-09 MED ORDER — PREGABALIN 50 MG PO CAPS
50.0000 mg | ORAL_CAPSULE | Freq: Three times a day (TID) | ORAL | 0 refills | Status: DC
Start: 2021-01-09 — End: 2021-02-09

## 2021-01-09 NOTE — Telephone Encounter (Addendum)
Thank you. I will contact Emily Pitts and let her know a new prescription has been sent in and of your directions.  Patient reporting the pharmacy has refused to fill  The Pregabalin 50 mg prescription written as  Take 1-3 capsules by mouth 3 (three) times daily.  Talked with pharmacy who reports the insurance will only pay for a maximum of 3 (three) capsules daily, meaning she would need to pay for the additional capsules herself.  I then talked with Emily Pitts to let her know we would call her this afternoon after Dr. Ancil Boozer reviews this information.  Routing to physician for review.

## 2021-01-16 DIAGNOSIS — M2241 Chondromalacia patellae, right knee: Secondary | ICD-10-CM | POA: Diagnosis not present

## 2021-02-07 ENCOUNTER — Other Ambulatory Visit: Payer: Self-pay | Admitting: Family Medicine

## 2021-02-07 ENCOUNTER — Encounter: Payer: Self-pay | Admitting: Family Medicine

## 2021-02-07 DIAGNOSIS — Z1231 Encounter for screening mammogram for malignant neoplasm of breast: Secondary | ICD-10-CM

## 2021-02-09 ENCOUNTER — Other Ambulatory Visit: Payer: Self-pay | Admitting: Family Medicine

## 2021-02-09 DIAGNOSIS — E1140 Type 2 diabetes mellitus with diabetic neuropathy, unspecified: Secondary | ICD-10-CM

## 2021-02-09 MED ORDER — PREGABALIN 150 MG PO CAPS
150.0000 mg | ORAL_CAPSULE | Freq: Every day | ORAL | 1 refills | Status: DC
Start: 2021-02-09 — End: 2021-05-13

## 2021-03-05 ENCOUNTER — Ambulatory Visit
Admission: RE | Admit: 2021-03-05 | Discharge: 2021-03-05 | Disposition: A | Discharge status: Home / Self Care | Payer: Medicare PPO | Source: Ambulatory Visit | Attending: Family Medicine | Admitting: Family Medicine

## 2021-03-05 ENCOUNTER — Other Ambulatory Visit: Payer: Self-pay

## 2021-03-05 DIAGNOSIS — Z1231 Encounter for screening mammogram for malignant neoplasm of breast: Secondary | ICD-10-CM | POA: Insufficient documentation

## 2021-04-21 ENCOUNTER — Telehealth: Payer: Self-pay | Admitting: Family Medicine

## 2021-04-21 NOTE — Telephone Encounter (Signed)
Copied from [place] [id number]. Topic: Medicare AWV >> Apr 21, 2021  2:58 PM Cher Nakai R wrote: Reason for CRM:   Left message for patient to call back and schedule Medicare Annual Wellness Visit (AWV) in office.   If unable to come into the office for AWV,  please offer to do virtually or by telephone.  No hx of AWV eligible for AWVI as of  03/09/2021 per [facility]  Please schedule at anytime with [place].      40 Minutes appointment   Any questions, please call me at [phone]

## 2021-04-22 ENCOUNTER — Ambulatory Visit (INDEPENDENT_AMBULATORY_CARE_PROVIDER_SITE_OTHER): Payer: Medicare PPO

## 2021-04-22 DIAGNOSIS — Z0000 Encounter for general adult medical examination without abnormal findings: Secondary | ICD-10-CM

## 2021-04-22 DIAGNOSIS — Z780 Asymptomatic menopausal state: Secondary | ICD-10-CM | POA: Diagnosis not present

## 2021-04-22 NOTE — Progress Notes (Signed)
Subjective:   ALTA GODING is a 66 y.o. female who presents for an Initial Medicare Annual Wellness Visit.  Virtual Visit via Telephone Note  I connected with  Taleisha Kaczynski Nero on 04/22/21 at  3:30 PM EST by telephone and verified that I am speaking with the correct person using two identifiers.  Location: Patient: home Provider: [place] Persons participating in the virtual visit: [place]   I discussed the limitations, risks, security and privacy concerns of performing an evaluation and management service by telephone and the availability of in person appointments. The patient expressed understanding and agreed to proceed.  Interactive audio and video telecommunications were attempted between this nurse and patient, however failed, due to patient having technical difficulties OR patient did not have access to video capability.  We continued and completed visit with audio only.  Some vital signs may be absent or patient reported.   Clemetine Marker, LPN   Review of Systems     Cardiac Risk Factors include: advanced age (>47men, >74 women);diabetes mellitus;dyslipidemia;hypertension;obesity (BMI >30kg/m2)     Objective:    There were no vitals filed for this visit. There is no height or weight on file to calculate BMI.  Advanced Directives 04/22/2021 11/05/2020 11/06/2018 05/05/2018 11/04/2017 05/06/2017 10/30/2016  Does Patient Have a Medical Advance Directive? No No No No No No No  Would patient like information on creating a medical advance directive? Yes (MAU/Ambulatory/Procedural Areas - Information given) No - Patient declined - No - Patient declined - - -    Current Medications (verified) Outpatient Encounter Medications as of 04/22/2021  Medication Sig   atorvastatin (LIPITOR) 40 MG tablet Take 1 tablet (40 mg total) by mouth daily.   azelastine (ASTELIN) 0.1 % nasal spray Place 2 sprays into both nostrils 2 (two) times daily. Use in each nostril as directed    cholecalciferol (VITAMIN D) 1000 units tablet Take 500 Units by mouth daily.    fluticasone (FLONASE) 50 MCG/ACT nasal spray USE 2 SPRAY(S) IN EACH NOSTRIL AS NEEDED   glucose blood (ONE TOUCH ULTRA TEST) test strip USE AS DIRECTED   Iron-FA-B Cmp-C-Biot-Probiotic (FUSION PLUS PO) Take 1 tablet by mouth daily. Vitamin with iron   levocetirizine (XYZAL) 5 MG tablet Take 1 tablet (5 mg total) by mouth daily.   MAGNESIUM OXIDE PO Take 500 mg by mouth daily.    metFORMIN (GLUCOPHAGE-XR) 750 MG 24 hr tablet Take 2 tablets (1,500 mg total) by mouth daily with breakfast.   montelukast (SINGULAIR) 10 MG tablet Take 1 tablet (10 mg total) by mouth at bedtime.   olmesartan-hydrochlorothiazide (BENICAR HCT) 20-12.5 MG tablet Take 1 tablet by mouth daily.   Omega-3 Fatty Acids (FISH OIL) 1000 MG CAPS Take by mouth.   polyethylene glycol powder (GLYCOLAX/MIRALAX) powder Take 17 g by mouth 2 (two) times daily.   pregabalin (LYRICA) 150 MG capsule Take 1 capsule (150 mg total) by mouth at bedtime.   rivaroxaban (XARELTO) 20 MG TABS tablet Take 1 tablet (20 mg total) by mouth daily with supper.   vitamin B-12 (CYANOCOBALAMIN) 500 MCG tablet Take 1,000 mcg by mouth daily.   tiZANidine (ZANAFLEX) 2 MG tablet Take 1 tablet (2 mg total) by mouth every 6 (six) hours as needed for muscle spasms. (Patient not taking: Reported on 04/22/2021)   No facility-administered encounter medications on file as of 04/22/2021.    Allergies (verified) Patient has no known allergies.   History: Past Medical History:  Diagnosis Date   Allergy  Diabetes mellitus without complication ([place]) 6606   Hyperlipidemia    Hypertension    Low back pain, episodic    Numbness of feet    Vitamin D deficiency    Past Surgical History:  Procedure Laterality Date   ABDOMINAL HYSTERECTOMY  2010   APPENDECTOMY  1966   COLONOSCOPY  06-07-13   Dr Bary Castilla   DILATION AND CURETTAGE OF UTERUS     OOPHORECTOMY     Family History  Problem  Relation Age of Onset   Diabetes Mother    COPD Mother    Diabetes Father    Hypertension Father    Cancer Father        Kidney and Prostate   CAD Father    Diabetes Brother        Oldest Brother   Kidney disease Brother        Tumor removed   Breast cancer Neg Hx    Social History   Socioeconomic History   Marital status: Single    Spouse name: Not on file   Number of children: 0   Years of education: Not on file   Highest education level: 12th grade  Occupational History   Not on file  Tobacco Use   Smoking status: Never   Smokeless tobacco: Never  Vaping Use   Vaping Use: Never used  Substance and Sexual Activity   Alcohol use: No    Alcohol/week: 0.0 standard drinks   Drug use: No   Sexual activity: Never  Other Topics Concern   Not on file  Social History Narrative   Not on file   Social Determinants of Health   Financial Resource Strain: Low Risk    Difficulty of Paying Living Expenses: Not hard at all  Food Insecurity: No Food Insecurity   Worried About Charity fundraiser in the Last Year: Never true   Ran Out of Food in the Last Year: Never true  Transportation Needs: No Transportation Needs   Lack of Transportation (Medical): No   Lack of Transportation (Non-Medical): No  Physical Activity: Inactive   Days of Exercise per Week: 0 days   Minutes of Exercise per Session: 0 min  Stress: No Stress Concern Present   Feeling of Stress : Not at all  Social Connections: Moderately Integrated   Frequency of Communication with Friends and Family: More than three times a week   Frequency of Social Gatherings with Friends and Family: More than three times a week   Attends Religious Services: More than 4 times per year   Active Member of [employer] or Organizations: Yes   Attends Music therapist: More than 4 times per year   Marital Status: Never married    Tobacco Counseling Counseling given: Not Answered   Clinical Intake:  Pre-visit  preparation completed: Yes  Pain : No/denies pain     Nutritional Risks: None Diabetes: Yes CBG done?: No Did pt. bring in CBG monitor from home?: No  How often do you need to have someone help you when you read instructions, pamphlets, or other written materials from your doctor or pharmacy?: 1 - Never  Nutrition Risk Assessment:  Has the patient had any N/V/D within the last 2 months?  No  Does the patient have any non-healing wounds?  No  Has the patient had any unintentional weight loss or weight gain?  No   Diabetes:  Is the patient diabetic?  Yes  If diabetic, was a CBG obtained today?  No  Did the patient bring in their glucometer from home?  No  How often do you monitor your CBG's? Pt does not actively check blood sugar.   Financial Strains and Diabetes Management:  Are you having any financial strains with the device, your supplies or your medication? No .  Does the patient want to be seen by Chronic Care Management for management of their diabetes?  No  Would the patient like to be referred to a Nutritionist or for Diabetic Management?  No   Diabetic Exams:  Diabetic Eye Exam: Completed 07/24/20.   Diabetic Foot Exam: Completed 01/02/21.   Interpreter Needed?: No  Information entered by :: Clemetine Marker LPN   Activities of Daily Living In your present state of health, do you have any difficulty performing the following activities: 04/22/2021 01/02/2021  Hearing? N N  Vision? N N  Difficulty concentrating or making decisions? N N  Walking or climbing stairs? N N  Dressing or bathing? N N  Doing errands, shopping? N N  Preparing Food and eating ? N -  Using the Toilet? N -  In the past six months, have you accidently leaked urine? N -  Do you have problems with loss of bowel control? N -  Managing your Medications? N -  Managing your Finances? N -  Housekeeping or managing your Housekeeping? N -  Some recent data might be hidden    Patient Care  Team: Steele Sizer, MD as PCP - General (Family Medicine) Laurence Ferrari, [place], MD as Consulting Physician (Dermatology)  Indicate any recent Medical Services you may have received from other than [facility] providers in the past year (date may be approximate).     Assessment:   This is a routine wellness examination for S[place].  Hearing/Vision screen Hearing Screening - Comments:: Pt denies hearing difficulty Vision Screening - Comments:: Annual vision screenings done at [facility]   Dietary issues and exercise activities discussed: Current Exercise Habits: The patient does not participate in regular exercise at present, Exercise limited by: None identified   Goals Addressed   None    Depression Screen PHQ 2/9 Scores 04/22/2021 01/02/2021 07/02/2020 02/29/2020 11/06/2019 05/05/2019 05/05/2019  PHQ - 2 Score 0 0 0 0 0 0 0  PHQ- 9 Score - 0 - - 7 3 0    Fall Risk Fall Risk  04/22/2021 01/02/2021 07/02/2020 02/29/2020 11/06/2019  Falls in the past year? 0 1 0 0 0  Number falls in past yr: 0 0 0 0 0  Injury with Fall? 0 0 0 0 0  Risk for fall due to : No Fall Risks No Fall Risks - - -  Follow up Falls prevention discussed Falls prevention discussed - - -    FALL RISK PREVENTION PERTAINING TO THE HOME:  Any stairs in or around the home? Yes  If so, are there any without handrails? No  Home free of loose throw rugs in walkways, pet beds, electrical cords, etc? Yes  Adequate lighting in your home to reduce risk of falls? Yes   ASSISTIVE DEVICES UTILIZED TO PREVENT FALLS:  Life alert? No  Use of a cane, walker or w/c? Yes  - cane if needed Grab bars in the bathroom? No  Shower chair or bench in shower? No  Elevated toilet seat or a handicapped toilet? No   TIMED UP AND GO:  Was the test performed? No . Telephonic visit.   Cognitive Function: Normal cognitive status assessed by direct observation by  this Nurse Health Advisor. No abnormalities found.           Immunizations Immunization History  Administered Date(s) Administered   Fluad Quad(high Dose 65+) 11/28/2019, 01/02/2021   Influenza, Seasonal, Injecte, Preservative Fre 11/29/2012   Influenza,inj,Quad PF,6+ Mos 10/13/2013, 02/26/2017, 01/02/2019   Influenza-Unspecified 12/02/2014, 12/02/2015, 11/30/2017   PFIZER(Purple Top)SARS-COV-2 Vaccination 05/30/2019, 06/20/2019, 04/18/2020   Pneumococcal Conjugate-13 02/03/2016   Pneumococcal Polysaccharide-23 01/15/2015, 11/06/2019   Tdap 12/17/2008, 01/02/2019   Zoster Recombinat (Shingrix) 03/01/2019, 05/05/2019   Zoster, Live 05/02/2015    TDAP status: Up to date  Flu Vaccine status: Up to date  Pneumococcal vaccine status: Up to date  Covid-19 vaccine status: Completed vaccines  Qualifies for Shingles Vaccine? Yes   Zostavax completed Yes   Shingrix Completed?: Yes  Screening Tests Health Maintenance  Topic Date Due   COVID-19 Vaccine (4 - Booster for Pfizer series) 06/13/2020   HEMOGLOBIN A1C  07/03/2021   OPHTHALMOLOGY EXAM  07/24/2021   FOOT EXAM  01/02/2022   MAMMOGRAM  03/06/2023   COLONOSCOPY (Pts 45-98yrs Insurance coverage will need to be confirmed)  06/08/2023   TETANUS/TDAP  01/01/2029   Pneumonia Vaccine 17+ Years old  Completed   INFLUENZA VACCINE  Completed   DEXA SCAN  Completed   Hepatitis C Screening  Completed   Zoster Vaccines- Shingrix  Completed   HPV VACCINES  Aged Out    Health Maintenance  Health Maintenance Due  Topic Date Due   COVID-19 Vaccine (4 - Booster for [place] series) 06/13/2020    Colorectal cancer screening: Type of screening: Colonoscopy. Completed 06/07/13. Repeat every 10 years  Mammogram status: Completed 03/05/21. Repeat every year  Bone Density status: Completed 2011. Results reflect: Bone density results: NORMAL. Repeat every 2 years. Ordered today  Lung Cancer Screening: (Low Dose CT Chest recommended if Age 65-80 years, 30 pack-year currently smoking OR have quit w/in  15years.) does not qualify.   Additional Screening:  Hepatitis C Screening: does qualify; Completed 12/28/11  Vision Screening: Recommended annual ophthalmology exams for early detection of glaucoma and other disorders of the eye. Is the patient up to date with their annual eye exam?  Yes  Who is the provider or what is the name of the office in which the patient attends annual eye exams? [facility].   Dental Screening: Recommended annual dental exams for proper oral hygiene  Community Resource Referral / Chronic Care Management: CRR required this visit?  No   CCM required this visit?  No      Plan:     I have personally reviewed and noted the following in the patients chart:   Medical and social history Use of alcohol, tobacco or illicit drugs  Current medications and supplements including opioid prescriptions. Patient is not currently taking opioid prescriptions. Functional ability and status Nutritional status Physical activity Advanced directives List of other physicians Hospitalizations, surgeries, and ER visits in previous 12 months Vitals Screenings to include cognitive, depression, and falls Referrals and appointments  In addition, I have reviewed and discussed with patient certain preventive protocols, quality metrics, and best practice recommendations. A written personalized care plan for preventive services as well as general preventive health recommendations were provided to patient.     Clemetine Marker, LPN   07/25/8414   Nurse Notes: none

## 2021-04-22 NOTE — Patient Instructions (Signed)
[name] , Thank you for taking time to come for your Medicare Wellness Visit. I appreciate your ongoing commitment to your health goals. Please review the following plan we discussed and let me know if I can assist you in the future.   Screening recommendations/referrals: Colonoscopy: done 06/07/13. Repeat 06/2023 Mammogram: done 03/05/21 Bone Density: done 2011. Please call [phone] to schedule your bone density screening.  Recommended yearly ophthalmology/optometry visit for glaucoma screening and checkup Recommended yearly dental visit for hygiene and checkup  Vaccinations: Influenza vaccine: done 01/02/21 Pneumococcal vaccine: done 11/06/19 Tdap vaccine: done 01/02/19 Shingles vaccine: done 03/01/19 & 05/05/19   Covid-19:done 05/30/19, 06/20/19 & 04/18/20  Advanced directives: Advance directive discussed with you today. I have provided a copy for you to complete at home and have notarized. Once this is complete please bring a copy in to our office so we can scan it into your chart.   Conditions/risks identified: Recommend increasing physical activity   Next appointment: Follow up in one year for your annual wellness visit    Preventive Care 65 Years and Older, Female Preventive care refers to lifestyle choices and visits with your health care provider that can promote health and wellness. What does preventive care include? A yearly physical exam. This is also called an annual well check. Dental exams once or twice a year. Routine eye exams. Ask your health care provider how often you should have your eyes checked. Personal lifestyle choices, including: Daily care of your teeth and gums. Regular physical activity. Eating a healthy diet. Avoiding tobacco and drug use. Limiting alcohol use. Practicing safe sex. Taking low-dose aspirin every day. Taking vitamin and mineral supplements as recommended by your health care provider. What happens during an annual well check? The  services and screenings done by your health care provider during your annual well check will depend on your age, overall health, lifestyle risk factors, and family history of disease. Counseling  Your health care provider may ask you questions about your: Alcohol use. Tobacco use. Drug use. Emotional well-being. Home and relationship well-being. Sexual activity. Eating habits. History of falls. Memory and ability to understand (cognition). Work and work Statistician. Reproductive health. Screening  You may have the following tests or measurements: Height, weight, and BMI. Blood pressure. Lipid and cholesterol levels. These may be checked every 5 years, or more frequently if you are over 52 years old. Skin check. Lung cancer screening. You may have this screening every year starting at age 71 if you have a 30-pack-year history of smoking and currently smoke or have quit within the past 15 years. Fecal occult blood test (FOBT) of the stool. You may have this test every year starting at age 31. Flexible sigmoidoscopy or colonoscopy. You may have a sigmoidoscopy every 5 years or a colonoscopy every 10 years starting at age 45. Hepatitis C blood test. Hepatitis B blood test. Sexually transmitted disease (STD) testing. Diabetes screening. This is done by checking your blood sugar (glucose) after you have not eaten for a while (fasting). You may have this done every 1-3 years. Bone density scan. This is done to screen for osteoporosis. You may have this done starting at age 16. Mammogram. This may be done every 1-2 years. Talk to your health care provider about how often you should have regular mammograms. Talk with your health care provider about your test results, treatment options, and if necessary, the need for more tests. Vaccines  Your health care provider may recommend certain vaccines, such  as: Influenza vaccine. This is recommended every year. Tetanus, diphtheria, and acellular  pertussis (Tdap, Td) vaccine. You may need a Td booster every 10 years. Zoster vaccine. You may need this after age 77. Pneumococcal 13-valent conjugate (PCV13) vaccine. One dose is recommended after age 67. Pneumococcal polysaccharide (PPSV23) vaccine. One dose is recommended after age 47. Talk to your health care provider about which screenings and vaccines you need and how often you need them. This information is not intended to replace advice given to you by your health care provider. Make sure you discuss any questions you have with your health care provider. Document Released: 03/22/2015 Document Revised: 11/13/2015 Document Reviewed: 12/25/2014 Elsevier Interactive Patient Education  2017 [place] Prevention in the Home Falls can cause injuries. They can happen to people of all ages. There are many things you can do to make your home safe and to help prevent falls. What can I do on the outside of my home? Regularly fix the edges of walkways and driveways and fix any cracks. Remove anything that might make you trip as you walk through a door, such as a raised step or threshold. Trim any bushes or trees on the path to your home. Use bright outdoor lighting. Clear any walking paths of anything that might make someone trip, such as rocks or tools. Regularly check to see if handrails are loose or broken. Make sure that both sides of any steps have handrails. Any raised decks and porches should have guardrails on the edges. Have any leaves, snow, or ice cleared regularly. Use sand or salt on walking paths during winter. Clean up any spills in your garage right away. This includes oil or grease spills. What can I do in the bathroom? Use night lights. Install grab bars by the toilet and in the tub and shower. Do not use towel bars as grab bars. Use non-skid mats or decals in the tub or shower. If you need to sit down in the shower, use a plastic, non-slip stool. Keep the floor  dry. Clean up any water that spills on the floor as soon as it happens. Remove soap buildup in the tub or shower regularly. Attach bath mats securely with double-sided non-slip rug tape. Do not have throw rugs and other things on the floor that can make you trip. What can I do in the bedroom? Use night lights. Make sure that you have a light by your bed that is easy to reach. Do not use any sheets or blankets that are too big for your bed. They should not hang down onto the floor. Have a firm chair that has side arms. You can use this for support while you get dressed. Do not have throw rugs and other things on the floor that can make you trip. What can I do in the kitchen? Clean up any spills right away. Avoid walking on wet floors. Keep items that you use a lot in easy-to-reach places. If you need to reach something above you, use a strong step stool that has a grab bar. Keep electrical cords out of the way. Do not use floor polish or wax that makes floors slippery. If you must use wax, use non-skid floor wax. Do not have throw rugs and other things on the floor that can make you trip. What can I do with my stairs? Do not leave any items on the stairs. Make sure that there are handrails on both sides of the stairs and use  them. Fix handrails that are broken or loose. Make sure that handrails are as long as the stairways. Check any carpeting to make sure that it is firmly attached to the stairs. Fix any carpet that is loose or worn. Avoid having throw rugs at the top or bottom of the stairs. If you do have throw rugs, attach them to the floor with carpet tape. Make sure that you have a light switch at the top of the stairs and the bottom of the stairs. If you do not have them, ask someone to add them for you. What else can I do to help prevent falls? Wear shoes that: Do not have high heels. Have rubber bottoms. Are comfortable and fit you well. Are closed at the toe. Do not wear  sandals. If you use a stepladder: Make sure that it is fully opened. Do not climb a closed stepladder. Make sure that both sides of the stepladder are locked into place. Ask someone to hold it for you, if possible. Clearly mark and make sure that you can see: Any grab bars or handrails. First and last steps. Where the edge of each step is. Use tools that help you move around (mobility aids) if they are needed. These include: Canes. Walkers. Scooters. Crutches. Turn on the lights when you go into a dark area. Replace any light bulbs as soon as they burn out. Set up your furniture so you have a clear path. Avoid moving your furniture around. If any of your floors are uneven, fix them. If there are any pets around you, be aware of where they are. Review your medicines with your doctor. Some medicines can make you feel dizzy. This can increase your chance of falling. Ask your doctor what other things that you can do to help prevent falls. This information is not intended to replace advice given to you by your health care provider. Make sure you discuss any questions you have with your health care provider. Document Released: 12/20/2008 Document Revised: 08/01/2015 Document Reviewed: 03/30/2014 Elsevier Interactive Patient Education  2017 [employer].

## 2021-05-12 NOTE — Progress Notes (Signed)
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 Chronic nocturia at least once at night - she drinks a lot of water in the evenings. She is on ARB , she has numbness on both feet ,she is taking B12 SL and last level was normal, she is on Lyrica but states no longer seems to help with pain  . She has a history of microalbuminuria and is taking ARB, dyslipidemia and takes statin therapy    Obesity:  she retired, , down another  8 lbs, she is still walking her dog, also playing bat mitten now, has not been riding her bike due to the rain , but only gained a few pounds    HTN: she is on Benicar hctz  she is taking one pill daily, no chest pain, palpitation, SOB or dizziness BP is at goal. She does not check bp at home     Hyperlipidemia: she is now on Atorvastatin and denies side effects .LDL is at goal, last HDL was 55 and within normal limits    GERD: very rare now, takes Tums prn only    Perennial AR with seasonal variation: She is on singulair, xyzal , Flonase and Astelin prn only , she states always has rhinorrhea since pollen is count is high    Portal Vein Thrombosis: saw hematologist but recently released from their care, she is still on Xarelto, she will be on that therapy for a life time, she denies any bleeding. She has  factor V leiden mutation . She was diagnosed with iron deficiency anemia of unknown cause, but HCT has been back to normal, no longer infusions but taking otc supplementation  She sees Emily Pitts for colonoscopy    Constipation:  Emily Pitts usually a 4 with medication. She takes Miralax prn only . Unchanged   OA right knee: doing better now, after she had steroid  injection   Left first toe nail changes: she noticed a dark spot on left toenail about 3 months ago, she initially thought it was a bruise but does not recall trauma and is not improving, sometimes toe feels sore but she has neuropathy and not sure if the nail change is the cause.   Patient Active Problem List   Diagnosis Date Noted   Iron deficiency anemia 02/14/2018   Chondromalacia, right knee 10/30/2016   Long term current use of anticoagulant therapy 01/05/2016   Factor 5 Leiden mutation, heterozygous (Emily Pitts) 05/02/2015   Portal vein thrombosis 11/07/2014   History of sepsis 10/10/2014   Abnormal electrocardiogram 10/08/2014   Nonspecific abnormal electromyogram (EMG) 10/08/2014   Benign essential HTN 10/08/2014   Diabetes mellitus with renal manifestation ([place]) 10/08/2014   Dyslipidemia 10/08/2014   LBP (low back pain) 10/08/2014   Gastro-esophageal reflux disease without esophagitis 10/08/2014   Microalbuminuria 10/08/2014   Disturbance of skin sensation 10/08/2014   Obesity (BMI 30-39.9) 10/08/2014   Perennial allergic rhinitis with seasonal variation 10/08/2014   Plantar fasciitis 10/08/2014   Postablative ovarian failure 10/08/2014   Menopausal symptom 10/08/2014   Vitamin D deficiency 10/08/2014    Past Surgical History:  Procedure Laterality Date   ABDOMINAL  HYSTERECTOMY  2010   APPENDECTOMY  1966   COLONOSCOPY  06-07-13   Dr Emily Pitts   DILATION AND CURETTAGE OF UTERUS     OOPHORECTOMY      Family History  Problem Relation Age of Onset   Diabetes Mother    COPD Mother    Diabetes Father    Hypertension Father    Cancer Father        Kidney and Prostate   CAD Father    Diabetes Brother        Oldest Brother   Kidney disease Brother        Tumor removed   Breast cancer Neg Hx     Social History   Tobacco Use   Smoking status: Never   Smokeless tobacco: Never  Substance Use Topics   Alcohol use: No    Alcohol/week: 0.0 standard drinks     Current  Outpatient Medications:    atorvastatin (LIPITOR) 40 MG tablet, Take 1 tablet (40 mg total) by mouth daily., Disp: 90 tablet, Rfl: 3   azelastine (ASTELIN) 0.1 % nasal spray, Place 2 sprays into both nostrils 2 (two) times daily. Use in each nostril as directed, Disp: 30 mL, Rfl: 2   cholecalciferol (VITAMIN D) 1000 units tablet, Take 500 Units by mouth daily. , Disp: , Rfl:    fluticasone (FLONASE) 50 MCG/ACT nasal spray, USE 2 SPRAY(S) IN EACH NOSTRIL AS NEEDED, Disp: 48 g, Rfl: 0   glucose blood (ONE TOUCH ULTRA TEST) test strip, USE AS DIRECTED, Disp: 100 each, Rfl: 12   Iron-FA-B Cmp-C-Biot-Probiotic (FUSION PLUS PO), Take 1 tablet by mouth daily. Vitamin with iron, Disp: , Rfl:    levocetirizine (XYZAL) 5 MG tablet, Take 1 tablet (5 mg total) by mouth daily., Disp: 90 tablet, Rfl: 1   MAGNESIUM OXIDE PO, Take 500 mg by mouth daily. , Disp: , Rfl:    metFORMIN (GLUCOPHAGE-XR) 750 MG 24 hr tablet, Take 2 tablets (1,500 mg total) by mouth daily with breakfast., Disp: 180 tablet, Rfl: 1   montelukast (SINGULAIR) 10 MG tablet, Take 1 tablet (10 mg total) by mouth at bedtime., Disp: 90 tablet, Rfl: 1   olmesartan-hydrochlorothiazide (BENICAR HCT) 20-12.5 MG tablet, Take 1 tablet by mouth daily., Disp: 90 tablet, Rfl: 1   polyethylene glycol powder (GLYCOLAX/MIRALAX) powder, Take 17 g by mouth 2 (two) times daily., Disp: 3350 g, Rfl: 1   pregabalin (LYRICA) 150 MG capsule, Take 1 capsule (150 mg total) by mouth at bedtime., Disp: 90 capsule, Rfl: 1   rivaroxaban (XARELTO) 20 MG TABS tablet, Take 1 tablet (20 mg total) by mouth daily with supper., Disp: 90 tablet, Rfl: 3   tiZANidine (ZANAFLEX) 2 MG tablet, Take 1 tablet (2 mg total) by mouth every 6 (six) hours as needed for muscle spasms., Disp: 30 tablet, Rfl: 0   vitamin B-12 (CYANOCOBALAMIN) 500 MCG tablet, Take 1,000 mcg by mouth daily., Disp: , Rfl:   No Known Allergies  I personally reviewed active problem list, medication list, allergies,  family history, social history, health maintenance with the patient/caregiver today.   ROS  Constitutional: Negative for fever or weight change.  Respiratory: Negative for cough and shortness of breath.   Cardiovascular: Negative for chest pain or palpitations.  Gastrointestinal: Negative for abdominal pain, no bowel changes.  Musculoskeletal: Negative for gait problem or joint swelling.  Skin: Negative for rash.  Neurological: Negative for dizziness or headache.  No other specific complaints in a complete review of systems (except  as listed in HPI above).   Objective  Vitals:   05/13/21 0837  BP: 122/80  Pulse: 90  Resp: 16  SpO2: 99%  Weight: 217 lb (98.4 kg)  Height: '5\' 9"'$  (1.753 m)    Body mass index is 32.05 kg/m.  Physical Exam  Constitutional: Patient appears well-developed and well-nourished. Obese  No distress.  HEENT: head atraumatic, normocephalic, pupils equal and reactive to light, , neck supple Cardiovascular: Normal rate, regular rhythm and normal heart sounds.  No murmur heard. No BLE edema. Nail: picture attached  Pulmonary/Chest: Effort normal and breath sounds normal. No respiratory distress. Abdominal: Soft.  There is no tenderness. Psychiatric: Patient has a normal mood and affect. behavior is normal. Judgment and thought content normal.    PHQ2/9: Depression screen [facility] 2/9 05/13/2021 04/22/2021 01/02/2021 07/02/2020 02/29/2020  Decreased Interest 0 0 0 0 0  Down, Depressed, Hopeless 0 0 0 0 0  PHQ - 2 Score 0 0 0 0 0  Altered sleeping 0 - 0 - -  Tired, decreased energy 0 - 0 - -  Change in appetite 0 - 0 - -  Feeling bad or failure about yourself  0 - 0 - -  Trouble concentrating 0 - 0 - -  Moving slowly or fidgety/restless 0 - 0 - -  Suicidal thoughts 0 - 0 - -  PHQ-9 Score 0 - 0 - -  Difficult doing work/chores - - - - -  Some recent data might be hidden    phq 9 is negative   Fall Risk: Fall Risk  05/13/2021 04/22/2021 01/02/2021 07/02/2020  02/29/2020  Falls in the past year? 0 0 1 0 0  Number falls in past yr: 0 0 0 0 0  Injury with Fall? 0 0 0 0 0  Risk for fall due to : No Fall Risks No Fall Risks No Fall Risks - -  Follow up Falls prevention discussed Falls prevention discussed Falls prevention discussed - -      Functional Status Survey: Is the patient deaf or have difficulty hearing?: Yes Does the patient have difficulty seeing, even when wearing glasses/contacts?: No Does the patient have difficulty concentrating, remembering, or making decisions?: No Does the patient have difficulty walking or climbing stairs?: No Does the patient have difficulty dressing or bathing?: No Does the patient have difficulty doing errands alone such as visiting a doctor's office or shopping?: No    Assessment & Plan   1. Controlled type 2 diabetes with neuropathy (HCC)  - HgB A1c - pregabalin (LYRICA) 75 MG capsule; Take 1-2 capsules (75-150 mg total) by mouth 2 (two) times daily.  Dispense: 270 capsule; Refill: 0 - Ambulatory referral to Podiatry  2. Type 2 diabetes mellitus with microalbuminuria, without long-term current use of insulin (HCC)  - metFORMIN (GLUCOPHAGE-XR) 750 MG 24 hr tablet; Take 2 tablets (1,500 mg total) by mouth daily with breakfast.  Dispense: 180 tablet; Refill: 1  3. Benign essential HTN  - olmesartan-hydrochlorothiazide (BENICAR HCT) 20-12.5 MG tablet; Take 1 tablet by mouth daily.  Dispense: 90 tablet; Refill: 1  4. Low serum vitamin B12   5. Factor 5 Leiden mutation, heterozygous ([place])   6. Gastro-esophageal reflux disease without esophagitis   7. Hypertension associated with diabetes ([place])   8. Portal vein thrombosis   9. Vitamin D deficiency   10. Chronic pain of right knee   11. Change in nail appearance  - Ambulatory referral to Podiatry

## 2021-05-13 ENCOUNTER — Encounter: Payer: Self-pay | Admitting: Family Medicine

## 2021-05-13 ENCOUNTER — Ambulatory Visit: Payer: Medicare PPO | Admitting: Family Medicine

## 2021-05-13 ENCOUNTER — Encounter: Payer: Self-pay | Admitting: Hematology and Oncology

## 2021-05-13 ENCOUNTER — Other Ambulatory Visit: Payer: Self-pay

## 2021-05-13 VITALS — BP 122/80 | HR 90 | Resp 16 | Ht 69.0 in | Wt 217.0 lb

## 2021-05-13 DIAGNOSIS — G8929 Other chronic pain: Secondary | ICD-10-CM

## 2021-05-13 DIAGNOSIS — L608 Other nail disorders: Secondary | ICD-10-CM

## 2021-05-13 DIAGNOSIS — I152 Hypertension secondary to endocrine disorders: Secondary | ICD-10-CM

## 2021-05-13 DIAGNOSIS — E1129 Type 2 diabetes mellitus with other diabetic kidney complication: Secondary | ICD-10-CM | POA: Diagnosis not present

## 2021-05-13 DIAGNOSIS — D6851 Activated protein C resistance: Secondary | ICD-10-CM

## 2021-05-13 DIAGNOSIS — E538 Deficiency of other specified B group vitamins: Secondary | ICD-10-CM | POA: Diagnosis not present

## 2021-05-13 DIAGNOSIS — E1159 Type 2 diabetes mellitus with other circulatory complications: Secondary | ICD-10-CM

## 2021-05-13 DIAGNOSIS — K219 Gastro-esophageal reflux disease without esophagitis: Secondary | ICD-10-CM | POA: Diagnosis not present

## 2021-05-13 DIAGNOSIS — E559 Vitamin D deficiency, unspecified: Secondary | ICD-10-CM

## 2021-05-13 DIAGNOSIS — I81 Portal vein thrombosis: Secondary | ICD-10-CM

## 2021-05-13 DIAGNOSIS — M25561 Pain in right knee: Secondary | ICD-10-CM

## 2021-05-13 DIAGNOSIS — I10 Essential (primary) hypertension: Secondary | ICD-10-CM

## 2021-05-13 DIAGNOSIS — E1140 Type 2 diabetes mellitus with diabetic neuropathy, unspecified: Secondary | ICD-10-CM

## 2021-05-13 DIAGNOSIS — R809 Proteinuria, unspecified: Secondary | ICD-10-CM

## 2021-05-13 MED ORDER — PREGABALIN 75 MG PO CAPS: 75.0000 mg | ORAL_CAPSULE | Freq: Two times a day (BID) | ORAL | 0 refills | Status: DC

## 2021-05-13 MED ORDER — OLMESARTAN MEDOXOMIL-HCTZ 20-12.5 MG PO TABS: 1.0000 | ORAL_TABLET | Freq: Every day | ORAL | 1 refills | Status: DC

## 2021-05-13 MED ORDER — METFORMIN HCL ER 750 MG PO TB24: 1500.0000 mg | ORAL_TABLET | Freq: Every day | ORAL | 1 refills | Status: DC

## 2021-05-14 LAB — HEMOGLOBIN A1C
Hgb A1c MFr Bld: 6.5 % of total Hgb — ABNORMAL HIGH (ref <5.7)
Mean Plasma Glucose: 140 mg/dL
eAG (mmol/L): 7.7 mmol/L

## 2021-05-23 ENCOUNTER — Other Ambulatory Visit: Payer: Self-pay

## 2021-05-23 ENCOUNTER — Encounter: Payer: Self-pay | Admitting: Podiatry

## 2021-05-23 ENCOUNTER — Ambulatory Visit: Payer: Medicare PPO | Admitting: Podiatry

## 2021-05-23 DIAGNOSIS — B351 Tinea unguium: Secondary | ICD-10-CM | POA: Diagnosis not present

## 2021-05-23 NOTE — Progress Notes (Signed)
? ?  Subjective: ?66 y.o. female presenting today as a new patient for evaluation of discoloration with some thickening to the left hallux nail plate.  Patient noticed it back in December.  She has not done anything for treatment.  She presents for further treatment and evaluation ? ?Past Medical History:  ?Diagnosis Date  ? Allergy   ? Diabetes mellitus without complication ([place]) 6213  ? Hyperlipidemia   ? Hypertension   ? Low back pain, episodic   ? Numbness of feet   ? Vitamin D deficiency   ? ?Past Surgical History:  ?Procedure Laterality Date  ? ABDOMINAL HYSTERECTOMY  2010  ? APPENDECTOMY  1966  ? COLONOSCOPY  06-07-13  ? Dr Bary Castilla  ? DILATION AND CURETTAGE OF UTERUS    ? OOPHORECTOMY    ? ?No Known Allergies ? ?Objective: ?Physical Exam ?General: The patient is alert and oriented x3 in no acute distress. ? ?Dermatology: Hyperkeratotic, discolored, thickened, onychodystrophy noted left hallux nail plate. Skin is warm, dry and supple bilateral lower extremities. Negative for open lesions or macerations. ? ?Vascular: Palpable pedal pulses bilaterally. No edema or erythema noted. Capillary refill within normal limits. ? ?Neurological: Epicritic and protective threshold grossly intact bilaterally.  ? ?Musculoskeletal Exam: no pedal deformities ? ?Assessment: ?#1 Onychomycosis of toenail left hallux ? ?Plan of Care:  ?#1 Patient was evaluated. ?#2  Today we discussed different treatment options including oral, topical, and laser antifungal treatment modalities.  We discussed their efficacies and side effects.  ?#3 OTC Formula3 antifungal nail lacquer ?#4 RTC PRN ? ? ?Edrick Kins, DPM ?[place] ? ?Dr. Edrick Kins, DPM  ?  ?[address]                                    ?[place], [place] [address]                ?Office [phone]  ?Fax [phone] ? ? ? ? ?

## 2021-05-29 LAB — HM DIABETES EYE EXAM

## 2021-06-04 ENCOUNTER — Encounter: Payer: Self-pay | Admitting: Family Medicine

## 2021-06-19 ENCOUNTER — Encounter: Payer: Medicare PPO | Admitting: Dermatology

## 2021-07-01 ENCOUNTER — Ambulatory Visit
Admission: RE | Admit: 2021-07-01 | Discharge: 2021-07-01 | Disposition: A | Discharge status: Home / Self Care | Payer: Medicare PPO | Source: Ambulatory Visit | Attending: Family Medicine | Admitting: Family Medicine

## 2021-07-01 DIAGNOSIS — Z780 Asymptomatic menopausal state: Secondary | ICD-10-CM | POA: Diagnosis not present

## 2021-07-01 DIAGNOSIS — M8589 Other specified disorders of bone density and structure, multiple sites: Secondary | ICD-10-CM | POA: Diagnosis not present

## 2021-07-25 DIAGNOSIS — Z0100 Encounter for examination of eyes and vision without abnormal findings: Secondary | ICD-10-CM | POA: Diagnosis not present

## 2021-07-25 DIAGNOSIS — H43813 Vitreous degeneration, bilateral: Secondary | ICD-10-CM | POA: Diagnosis not present

## 2021-07-25 LAB — HM DIABETES EYE EXAM

## 2021-07-28 ENCOUNTER — Encounter: Payer: Self-pay | Admitting: Family Medicine

## 2021-08-14 ENCOUNTER — Other Ambulatory Visit: Payer: Self-pay | Admitting: Family Medicine

## 2021-08-14 DIAGNOSIS — E1140 Type 2 diabetes mellitus with diabetic neuropathy, unspecified: Secondary | ICD-10-CM

## 2021-08-19 ENCOUNTER — Other Ambulatory Visit: Payer: Self-pay | Admitting: Family Medicine

## 2021-08-19 ENCOUNTER — Ambulatory Visit: Payer: Medicare PPO | Admitting: Family Medicine

## 2021-08-19 ENCOUNTER — Encounter: Payer: Self-pay | Admitting: Family Medicine

## 2021-08-19 VITALS — BP 136/84 | HR 97 | Resp 16 | Ht 69.0 in | Wt 228.0 lb

## 2021-08-19 DIAGNOSIS — E669 Obesity, unspecified: Secondary | ICD-10-CM | POA: Diagnosis not present

## 2021-08-19 DIAGNOSIS — M67431 Ganglion, right wrist: Secondary | ICD-10-CM | POA: Diagnosis not present

## 2021-08-19 DIAGNOSIS — J302 Other seasonal allergic rhinitis: Secondary | ICD-10-CM

## 2021-08-19 NOTE — Progress Notes (Signed)
Name: Emily Pitts   MRN: [id number]    DOB: May 19, 1955   Date:08/19/2021       Progress Note  Subjective  Chief Complaint  Cyst  HPI  Cyst right wrist: she noticed it a couple of months ago, she states changes in size, mild intermittent discomfort and sometimes itchy, not affecting her function. She was just wondering if there is something to be done. Discussed benign nature and we will monitor for now   Obesity: she has gained 9 lbs in the past 3 months, she states worried that is taking Lyrica ( for neuropathy but not sure if helping much) but also not as active as she used to be.  Discussed increasing physical activity  Patient Active Problem List   Diagnosis Date Noted   Iron deficiency anemia 02/14/2018   Chondromalacia, right knee 10/30/2016   Long term current use of anticoagulant therapy 01/05/2016   Factor 5 Leiden mutation, heterozygous ([place]) 05/02/2015   Portal vein thrombosis 11/07/2014   History of sepsis 10/10/2014   Abnormal electrocardiogram 10/08/2014   Nonspecific abnormal electromyogram (EMG) 10/08/2014   Benign essential HTN 10/08/2014   Diabetes mellitus with renal manifestation ([place]) 10/08/2014   Dyslipidemia 10/08/2014   LBP (low back pain) 10/08/2014   Gastro-esophageal reflux disease without esophagitis 10/08/2014   Microalbuminuria 10/08/2014   Disturbance of skin sensation 10/08/2014   Obesity (BMI 30-39.9) 10/08/2014   Perennial allergic rhinitis with seasonal variation 10/08/2014   Plantar fasciitis 10/08/2014   Postablative ovarian failure 10/08/2014   Menopausal symptom 10/08/2014   Vitamin D deficiency 10/08/2014    Past Surgical History:  Procedure Laterality Date   ABDOMINAL HYSTERECTOMY  2010   APPENDECTOMY  1966   COLONOSCOPY  06-07-13   Dr Bary Castilla   DILATION AND CURETTAGE OF UTERUS     OOPHORECTOMY      Family History  Problem Relation Age of Onset   Diabetes Mother    COPD Mother    Diabetes Father    Hypertension Father     Cancer Father        Kidney and Prostate   CAD Father    Diabetes Brother        Oldest Brother   Kidney disease Brother        Tumor removed   Breast cancer Neg Hx     Social History   Tobacco Use   Smoking status: Never   Smokeless tobacco: Never  Substance Use Topics   Alcohol use: No    Alcohol/week: 0.0 standard drinks of alcohol     Current Outpatient Medications:    atorvastatin (LIPITOR) 40 MG tablet, Take 1 tablet (40 mg total) by mouth daily., Disp: 90 tablet, Rfl: 3   azelastine (ASTELIN) 0.1 % nasal spray, Place 2 sprays into both nostrils 2 (two) times daily. Use in each nostril as directed, Disp: 30 mL, Rfl: 2   cholecalciferol (VITAMIN D) 1000 units tablet, Take 500 Units by mouth daily. , Disp: , Rfl:    fluticasone (FLONASE) 50 MCG/ACT nasal spray, USE 2 SPRAY(S) IN EACH NOSTRIL AS NEEDED, Disp: 48 g, Rfl: 0   glucose blood (ONE TOUCH ULTRA TEST) test strip, USE AS DIRECTED, Disp: 100 each, Rfl: 12   Iron-FA-B Cmp-C-Biot-Probiotic (FUSION PLUS PO), Take 1 tablet by mouth daily. Vitamin with iron, Disp: , Rfl:    levocetirizine (XYZAL) 5 MG tablet, Take 1 tablet (5 mg total) by mouth daily., Disp: 90 tablet, Rfl: 1   MAGNESIUM OXIDE PO, Take  500 mg by mouth daily. , Disp: , Rfl:    metFORMIN (GLUCOPHAGE-XR) 750 MG 24 hr tablet, Take 2 tablets (1,500 mg total) by mouth daily with breakfast., Disp: 180 tablet, Rfl: 1   montelukast (SINGULAIR) 10 MG tablet, Take 1 tablet (10 mg total) by mouth at bedtime., Disp: 90 tablet, Rfl: 1   olmesartan-hydrochlorothiazide (BENICAR HCT) 20-12.5 MG tablet, Take 1 tablet by mouth daily., Disp: 90 tablet, Rfl: 1   polyethylene glycol powder (GLYCOLAX/MIRALAX) powder, Take 17 g by mouth 2 (two) times daily., Disp: 3350 g, Rfl: 1   pregabalin (LYRICA) 75 MG capsule, Take 1-2 capsules (75-150 mg total) by mouth 2 (two) times daily., Disp: 270 capsule, Rfl: 0   rivaroxaban (XARELTO) 20 MG TABS tablet, Take 1 tablet (20 mg total) by  mouth daily with supper., Disp: 90 tablet, Rfl: 3   tiZANidine (ZANAFLEX) 2 MG tablet, Take 1 tablet (2 mg total) by mouth every 6 (six) hours as needed for muscle spasms., Disp: 30 tablet, Rfl: 0   vitamin B-12 (CYANOCOBALAMIN) 500 MCG tablet, Take 1,000 mcg by mouth daily., Disp: , Rfl:   No Known Allergies  I personally reviewed active problem list, medication list, allergies, family history, social history, health maintenance with the patient/caregiver today.   ROS  Constitutional: Negative for fever, positive for  weight change.  Respiratory: Negative for cough and shortness of breath.   Cardiovascular: Negative for chest pain or palpitations.  Gastrointestinal: Negative for abdominal pain, no bowel changes.  Musculoskeletal: Negative for gait problem or joint swelling.  Skin: Negative for rash.  Neurological: Negative for dizziness or headache.  No other specific complaints in a complete review of systems (except as listed in HPI above).   Objective  Vitals:   08/19/21 0937  BP: 136/84  Pulse: 97  Resp: 16  SpO2: 98%  Weight: 228 lb (103.4 kg)  Height: '5\' 9"'$  (1.753 m)    Body mass index is 33.67 kg/m.  Physical Exam  Constitutional: Patient appears well-developed and well-nourished. Obese  No distress.  HEENT: head atraumatic, normocephalic, pupils equal and reactive to light, neck supple Cardiovascular: Normal rate, regular rhythm and normal heart sounds.  No murmur heard. No BLE edema. Pulmonary/Chest: Effort normal and breath sounds normal. No respiratory distress. Abdominal: Soft.  There is no tenderness. Muscular skeletal: small cyst on right ventral radial aspect of right wrist  Psychiatric: Patient has a normal mood and affect. behavior is normal. Judgment and thought content normal.   Recent Results (from the past 2160 hour(s))  HM DIABETES EYE EXAM     Status: None   Collection Time: 05/29/21 12:00 AM  Result Value Ref Range   HM Diabetic Eye Exam No  Retinopathy No Retinopathy     PHQ2/9:    08/19/2021    9:36 AM 05/13/2021    8:36 AM 04/22/2021    4:00 PM 01/02/2021    7:36 AM 07/02/2020    9:09 AM  Depression screen PHQ 2/9  Decreased Interest 0 0 0 0 0  Down, Depressed, Hopeless 0 0 0 0 0  PHQ - 2 Score 0 0 0 0 0  Altered sleeping 0 0  0   Tired, decreased energy 0 0  0   Change in appetite 0 0  0   Feeling bad or failure about yourself  0 0  0   Trouble concentrating 0 0  0   Moving slowly or fidgety/restless 0 0  0   Suicidal thoughts 0  0  0   PHQ-9 Score 0 0  0     phq 9 is negative   Fall Risk:    08/19/2021    9:36 AM 05/13/2021    8:36 AM 04/22/2021    4:02 PM 01/02/2021    7:36 AM 07/02/2020    9:09 AM  Fall Risk   Falls in the past year? 0 0 0 1 0  Number falls in past yr: 0 0 0 0 0  Injury with Fall? 0 0 0 0 0  Risk for fall due to : No Fall Risks No Fall Risks No Fall Risks No Fall Risks   Follow up Falls prevention discussed Falls prevention discussed Falls prevention discussed Falls prevention discussed       Functional Status Survey: Is the patient deaf or have difficulty hearing?: No Does the patient have difficulty seeing, even when wearing glasses/contacts?: No Does the patient have difficulty concentrating, remembering, or making decisions?: No Does the patient have difficulty walking or climbing stairs?: No Does the patient have difficulty dressing or bathing?: No Does the patient have difficulty doing errands alone such as visiting a doctor's office or shopping?: No    Assessment & Plan  1. Ganglion cyst of wrist, right   Reassurance given   2. Obesity (BMI 30-39.9)  Gainning weight, she will try cutting down on lyrica and increase physical activity

## 2021-08-19 NOTE — Patient Instructions (Signed)
Ganglion Cyst  A ganglion cyst is a non-cancerous, fluid-filled lump of tissue that occurs near a joint, tendon, or ligament. The cyst grows out of a joint or the lining of a tendon or ligament. Ganglion cysts most often develop in the hand or wrist, but they can also develop in the shoulder, elbow, hip, knee, ankle, or foot. Ganglion cysts are ball-shaped or egg-shaped. Their size can range from the size of a pea to larger than a grape. Increased activity may cause the cyst to get bigger because more fluid starts to build up. What are the causes? The exact cause of this condition is not known, but it may be related to: Inflammation or irritation around the joint. An injury or tear in the layers of tissue around the joint (joint capsule). Repetitive movements or overuse. History of acute or repeated injury. What increases the risk? You are more likely to develop this condition if: You are a female. You are 74-36 years old. What are the signs or symptoms? The main symptom of this condition is a lump. It most often appears on the hand or wrist. In many cases, there are no other symptoms, but a cyst can sometimes cause: Tingling. Pain or tenderness. Numbness. Weakness or loss of strength in the affected joint. Decreased range of motion in the affected area of the body. How is this diagnosed? Ganglion cysts are usually diagnosed based on a physical exam. Your health care provider will feel the lump and may shine a light next to it. If it is a ganglion cyst, the light will likely shine through it. Your health care provider may order an X-ray, ultrasound, MRI, or CT scan to rule out other conditions. How is this treated? Ganglion cysts often go away on their own without treatment. If you have pain or other symptoms, treatment may be needed. Treatment is also needed if the ganglion cyst limits your movement or if it gets infected. Treatment may include: Wearing a brace or splint on your wrist or  finger. Taking anti-inflammatory medicine. Having fluid drained from the lump with a needle (aspiration). Getting an injection of medicine into the joint to decrease inflammation. This may be corticosteroids, ethanol, or hyaluronidase. Having surgery to remove the ganglion cyst. Placing a pad in your shoe or wearing shoes that will not rub against the cyst if it is on your foot. Follow these instructions at home: Do not press on the ganglion cyst, poke it with a needle, or hit it. Take over-the-counter and prescription medicines only as told by your health care provider. If you have a brace or splint: Wear it as told by your health care provider. Remove it as told by your health care provider. Ask if you need to remove it when you take a shower or a bath. Watch your ganglion cyst for any changes. Keep all follow-up visits as told by your health care provider. This is important. Contact a health care provider if: Your ganglion cyst becomes larger or more painful. You have pus coming from the lump. You have weakness or numbness in the affected area. You have a fever or chills. Get help right away if: You have a fever and have any of these in the cyst area: Increased redness. Red streaks. Swelling. Summary A ganglion cyst is a non-cancerous, fluid-filled lump that occurs near a joint, tendon, or ligament. Ganglion cysts most often develop in the hand or wrist, but they can also develop in the shoulder, elbow, hip, knee, ankle, or  foot. Ganglion cysts often go away on their own without treatment. This information is not intended to replace advice given to you by your health care provider. Make sure you discuss any questions you have with your health care provider. Document Revised: 05/17/2019 Document Reviewed: 05/17/2019 Elsevier Patient Education  Emily Pitts.

## 2021-08-20 ENCOUNTER — Encounter: Payer: Medicare PPO | Admitting: Dermatology

## 2021-08-27 ENCOUNTER — Ambulatory Visit: Payer: Medicare PPO | Admitting: Family Medicine

## 2021-09-17 ENCOUNTER — Other Ambulatory Visit: Payer: Self-pay | Admitting: Family Medicine

## 2021-09-17 DIAGNOSIS — J302 Other seasonal allergic rhinitis: Secondary | ICD-10-CM

## 2021-09-18 ENCOUNTER — Ambulatory Visit (INDEPENDENT_AMBULATORY_CARE_PROVIDER_SITE_OTHER): Payer: Medicare PPO | Admitting: Dermatology

## 2021-09-18 DIAGNOSIS — D229 Melanocytic nevi, unspecified: Secondary | ICD-10-CM

## 2021-09-18 DIAGNOSIS — S80811A Abrasion, right lower leg, initial encounter: Secondary | ICD-10-CM

## 2021-09-18 DIAGNOSIS — L570 Actinic keratosis: Secondary | ICD-10-CM | POA: Diagnosis not present

## 2021-09-18 DIAGNOSIS — D2372 Other benign neoplasm of skin of left lower limb, including hip: Secondary | ICD-10-CM | POA: Diagnosis not present

## 2021-09-18 DIAGNOSIS — L578 Other skin changes due to chronic exposure to nonionizing radiation: Secondary | ICD-10-CM

## 2021-09-18 DIAGNOSIS — L308 Other specified dermatitis: Secondary | ICD-10-CM | POA: Diagnosis not present

## 2021-09-18 DIAGNOSIS — D1800 Hemangioma unspecified site: Secondary | ICD-10-CM | POA: Diagnosis not present

## 2021-09-18 DIAGNOSIS — L821 Other seborrheic keratosis: Secondary | ICD-10-CM

## 2021-09-18 DIAGNOSIS — Z1283 Encounter for screening for malignant neoplasm of skin: Secondary | ICD-10-CM | POA: Diagnosis not present

## 2021-09-18 DIAGNOSIS — L720 Epidermal cyst: Secondary | ICD-10-CM | POA: Diagnosis not present

## 2021-09-18 DIAGNOSIS — L814 Other melanin hyperpigmentation: Secondary | ICD-10-CM

## 2021-09-18 DIAGNOSIS — T148XXA Other injury of unspecified body region, initial encounter: Secondary | ICD-10-CM

## 2021-09-18 MED ORDER — TRIAMCINOLONE ACETONIDE 0.1 % EX CREA: 1.0000 | TOPICAL_CREAM | Freq: Two times a day (BID) | CUTANEOUS | 1 refills | Status: DC

## 2021-09-18 NOTE — Patient Instructions (Addendum)
Start triamcinolone 0.1% cream twice daily for up to 2 weeks as needed for itch. Avoid applying to face, groin, and axilla. Use as directed. Long-term use can cause thinning of the skin.  Recommend OTC Gold Bond Rapid Relief Anti-Itch cream (pramoxine + menthol), CeraVe Anti-itch cream or lotion (pramoxine), Sarna lotion (Original- menthol + camphor or Sensitive- pramoxine) or Eucerin 12 hour Itch Relief lotion (menthol) up to 3 times per day to areas on body that are itchy.  Topical steroids (such as triamcinolone, fluocinolone, fluocinonide, mometasone, clobetasol, halobetasol, betamethasone, hydrocortisone) can cause thinning and lightening of the skin if they are used for too long in the same area. Your physician has selected the right strength medicine for your problem and area affected on the body. Please use your medication only as directed by your physician to prevent side effects.   Cryotherapy Aftercare  Wash gently with soap and water everyday.   Apply Vaseline and Band-Aid daily until healed.   Recommend taking Heliocare sun protection supplement daily in sunny weather for additional sun protection. For maximum protection on the sunniest days, you can take up to 2 capsules of regular Heliocare OR take 1 capsule of Heliocare Ultra. For prolonged exposure (such as a full day in the sun), you can repeat your dose of the supplement 4 hours after your first dose. Heliocare can be purchased at [employer], at some [facility] or at [email or web address].    Melanoma ABCDEs  Melanoma is the most dangerous type of skin cancer, and is the leading cause of death from skin disease.  You are more likely to develop melanoma if you: Have light-colored skin, light-colored eyes, or red or blond hair Spend a lot of time in the sun Tan regularly, either outdoors or in a tanning bed Have had blistering sunburns, especially during childhood Have a close family member who has had a melanoma Have  atypical moles or large birthmarks  Early detection of melanoma is key since treatment is typically straightforward and cure rates are extremely high if we catch it early.   The first sign of melanoma is often a change in a mole or a new dark spot.  The ABCDE system is a way of remembering the signs of melanoma.  A for asymmetry:  The two halves do not match. B for border:  The edges of the growth are irregular. C for color:  A mixture of colors are present instead of an even brown color. D for diameter:  Melanomas are usually (but not always) greater than 37m - the size of a pencil eraser. E for evolution:  The spot keeps changing in size, shape, and color.  Please check your skin once per month between visits. You can use a small mirror in front and a large mirror behind you to keep an eye on the back side or your body.   If you see any new or changing lesions before your next follow-up, please call to schedule a visit.  Please continue daily skin protection including broad spectrum sunscreen SPF 30+ to sun-exposed areas, reapplying every 2 hours as needed when you're outdoors.    Due to recent changes in healthcare laws, you may see results of your pathology and/or laboratory studies on MyChart before the doctors have had a chance to review them. We understand that in some cases there may be results that are confusing or concerning to you. Please understand that not all results are received at the same time and often the doctors  may need to interpret multiple results in order to provide you with the best plan of care or course of treatment. Therefore, we ask that you please give [place] 2 business days to thoroughly review all your results before contacting the office for clarification. Should we see a critical lab result, you will be contacted sooner.   If You Need Anything After Your Visit  If you have any questions or concerns for your doctor, please call our main line at [phone] and  press option 4 to reach your doctor's medical assistant. If no one answers, please leave a voicemail as directed and we will return your call as soon as possible. Messages left after 4 pm will be answered the following business day.   You may also send [place] a message via [place]. We typically respond to MyChart messages within 1-2 business days.  For prescription refills, please ask your pharmacy to contact our office. Our fax number is [phone].  If you have an urgent issue when the clinic is closed that cannot wait until the next business day, you can page your doctor at the number below.    Please note that while we do our best to be available for urgent issues outside of office hours, we are not available 24/7.   If you have an urgent issue and are unable to reach [place], you may choose to seek medical care at your doctor's office, retail clinic, urgent care center, or emergency room.  If you have a medical emergency, please immediately call 911 or go to the emergency department.  Pager Numbers  - Dr. Nehemiah Massed: [phone]  - Dr. Laurence Ferrari: [phone]  - Dr. Nicole Kindred: [phone]  In the event of inclement weather, please call our main line at [phone] for an update on the status of any delays or closures.  Dermatology Medication Tips: Please keep the boxes that topical medications come in in order to help keep track of the instructions about where and how to use these. Pharmacies typically print the medication instructions only on the boxes and not directly on the medication tubes.   If your medication is too expensive, please contact our office at [phone] option 4 or send [place] a message through [place].   We are unable to tell what your co-pay for medications will be in advance as this is different depending on your insurance coverage. However, we may be able to find a substitute medication at lower cost or fill out paperwork to get insurance to cover a needed medication.   If  a prior authorization is required to get your medication covered by your insurance company, please allow [place] 1-2 business days to complete this process.  Drug prices often vary depending on where the prescription is filled and some pharmacies may offer cheaper prices.  The website www.goodrx.com contains coupons for medications through different pharmacies. The prices here do not account for what the cost may be with help from insurance (it may be cheaper with your insurance), but the website can give you the price if you did not use any insurance.  - You can print the associated coupon and take it with your prescription to the pharmacy.  - You may also stop by our office during regular business hours and pick up a GoodRx coupon card.  - If you need your prescription sent electronically to a different pharmacy, notify our office through [facility] or by phone at [phone] option 4.     Si Usted Necesita Algo Despus  de Su Visita  Tambin puede enviarnos un mensaje a travs de Pharmacist, community. Por lo general respondemos a los mensajes de MyChart en el transcurso de 1 a 2 das hbiles.  Para renovar recetas, por favor pida a su farmacia que se ponga en contacto con nuestra oficina. Harland Dingwall de fax es [place] [phone].  Si tiene un asunto urgente cuando la clnica est cerrada y que no puede esperar hasta el siguiente da hbil, puede llamar/localizar a su doctor(a) al nmero que aparece a continuacin.   Por favor, tenga en cuenta que aunque hacemos todo lo posible para estar disponibles para asuntos urgentes fuera del horario de [place], no estamos disponibles las 24 horas del da, los 7 das de la [place].   Si tiene un problema urgente y no puede comunicarse con nosotros, puede optar por buscar atencin mdica  en el consultorio de su doctor(a), en una clnica privada, en un centro de atencin urgente o en una sala de emergencias.  Si tiene Engineering geologist, por favor llame  inmediatamente al 911 o vaya a la sala de emergencias.  Nmeros de bper  - Dr. Nehemiah Massed: [phone]  - Dra. Moye: [phone]  - Dra. Nicole Kindred: [phone]  En caso de inclemencias del [place], por favor llame a Johnsie Kindred principal al [phone] para una actualizacin sobre el [place] de cualquier retraso o cierre.  Consejos para la medicacin en dermatologa: Por favor, guarde las cajas en las que vienen los medicamentos de uso tpico para ayudarle a seguir las instrucciones sobre dnde y cmo usarlos. Las farmacias generalmente imprimen las instrucciones del medicamento slo en las cajas y no directamente en los tubos del [place].   Si su medicamento es muy caro, por favor, pngase en contacto con Zigmund Daniel llamando al [phone] y presione la opcin 4 o envenos un mensaje a travs de Pharmacist, community.   No podemos decirle cul ser su copago por los medicamentos por adelantado ya que esto es diferente dependiendo de la cobertura de su seguro. Sin embargo, es posible que podamos encontrar un medicamento sustituto a Electrical engineer un formulario para que el seguro cubra el medicamento que se considera necesario.   Si se requiere una autorizacin previa para que su compaa de seguros [place] su medicamento, por favor permtanos de 1 a 2 das hbiles para completar este proceso.  Los precios de los medicamentos varan con frecuencia dependiendo del Environmental consultant de dnde se surte la receta y alguna farmacias pueden ofrecer precios ms baratos.  El sitio web www.goodrx.com tiene cupones para medicamentos de Airline pilot. Los precios aqu no tienen en cuenta lo que podra costar con la ayuda del seguro (puede ser ms barato con su seguro), pero el sitio web puede darle el precio si no utiliz Research scientist (physical sciences).  - Puede imprimir el cupn correspondiente y llevarlo con su receta a la farmacia.  - Tambin puede pasar por nuestra oficina durante el horario de atencin regular y Charity fundraiser  una tarjeta de cupones de GoodRx.  - Si necesita que su receta se enve electrnicamente a una farmacia diferente, informe a nuestra oficina a travs de MyChart de [place] o por telfono llamando al [phone] y presione la opcin 4.

## 2021-09-18 NOTE — Telephone Encounter (Signed)
Requested Prescriptions  Pending Prescriptions Disp Refills  . montelukast (SINGULAIR) 10 MG tablet [Pharmacy Med Name: Montelukast Sodium 10 MG Oral Tablet] 90 tablet 0    Sig: TAKE 1 TABLET BY MOUTH AT BEDTIME     Pulmonology:  Leukotriene Inhibitors Passed - 09/17/2021  8:46 PM      Passed - Valid encounter within last 12 months    Recent Outpatient Visits          1 month ago Ganglion cyst of wrist, right   [facility] Steele Sizer, MD   4 months ago Controlled type 2 diabetes with neuropathy [facility])   [facility] Steele Sizer, MD   8 months ago Type 2 diabetes mellitus with microalbuminuria, without long-term current use of insulin [facility])   [facility] [place], Drue Stager, MD   1 year ago Type 2 diabetes mellitus with microalbuminuria, without long-term current use of insulin [facility])   [facility] [place], Drue Stager, MD   1 year ago Type 2 diabetes mellitus with microalbuminuria, without long-term current use of insulin [facility])   [facility] Steele Sizer, MD      Future Appointments            In 3 weeks Ancil Boozer, Drue Stager, MD [facility] [place]   In 7 months  [facility], [facility]

## 2021-09-18 NOTE — Progress Notes (Signed)
Follow-Up Visit   Subjective  Emily Pitts is a 66 y.o. female who presents for the following: Annual Exam (The patient presents for Total-Body Skin Exam (TBSE) for skin cancer screening and mole check.  The patient has spots, moles and lesions to be evaluated, some may be new or changing and the patient has concerns that these could be cancer. Patient with hx of AK's. She does have some itching at left forearm and has used otc anti-itch cream. ).   The following portions of the chart were reviewed this encounter and updated as appropriate:   Tobacco  Allergies  Meds  Problems  Med Hx  Surg Hx  Fam Hx      Review of Systems:  No other skin or systemic complaints except as noted in HPI or Assessment and Plan.  Objective  Well appearing patient in no apparent distress; mood and affect are within normal limits.  A full examination was performed including scalp, head, eyes, ears, nose, lips, neck, chest, axillae, abdomen, back, buttocks, bilateral upper extremities, bilateral lower extremities, hands, feet, fingers, toes, fingernails, and toenails. All findings within normal limits unless otherwise noted below.  Left Forearm Scaly pink papules coalescing to plaques   left dorsal hand x 1, right dorsal hand x 2, right forearm x 1 (4) Erythematous thin papules/macules with gritty scale.   right lateral pretibia Deep excoriation   face Smooth white papule(s).     Assessment & Plan  Other eczema Left Forearm  Start TMC 0.1% cream twice daily for up to 2 weeks as needed for itch. Avoid applying to face, groin, and axilla. Use as directed. Long-term use can cause thinning of the skin.  Recommend OTC Gold Bond Rapid Relief Anti-Itch cream (pramoxine + menthol), CeraVe Anti-itch cream or lotion (pramoxine), Sarna lotion (Original- menthol + camphor or Sensitive- pramoxine) or Eucerin 12 hour Itch Relief lotion (menthol) up to 3 times per day to areas on body that are  itchy.  Topical steroids (such as triamcinolone, fluocinolone, fluocinonide, mometasone, clobetasol, halobetasol, betamethasone, hydrocortisone) can cause thinning and lightening of the skin if they are used for too long in the same area. Your physician has selected the right strength medicine for your problem and area affected on the body. Please use your medication only as directed by your physician to prevent side effects.    triamcinolone cream (KENALOG) 0.1 % - Left Forearm Apply 1 Application topically 2 (two) times daily. For up to 2 weeks as needed for rash/itch. Avoid applying to face, groin, and axilla. Use as directed. Long-term use can cause thinning of the skin.  AK (actinic keratosis) (4) left dorsal hand x 1, right dorsal hand x 2, right forearm x 1  Actinic keratoses are precancerous spots that appear secondary to cumulative UV radiation exposure/sun exposure over time. They are chronic with expected duration over 1 year. A portion of actinic keratoses will progress to squamous cell carcinoma of the skin. It is not possible to reliably predict which spots will progress to skin cancer and so treatment is recommended to prevent development of skin cancer.  Recommend daily broad spectrum sunscreen SPF 30+ to sun-exposed areas, reapply every 2 hours as needed.  Recommend staying in the shade or wearing long sleeves, sun glasses (UVA+UVB protection) and wide brim hats (4-inch brim around the entire circumference of the hat). Call for new or changing lesions.  Prior to procedure, discussed risks of blister formation, small wound, skin dyspigmentation, or rare scar following  cryotherapy. Recommend Vaseline ointment to treated areas while healing.   Destruction of lesion - left dorsal hand x 1, right dorsal hand x 2, right forearm x 1  Destruction method: cryotherapy   Informed consent: discussed and consent obtained   Lesion destroyed using liquid nitrogen: Yes   Cryotherapy cycles:   2 Outcome: patient tolerated procedure well with no complications   Post-procedure details: wound care instructions given    Excoriation right lateral pretibia  Recommend wound care with Vaseline and band aid.   Milia face  Benign-appearing.  Observation.  Call clinic for new or changing lesions.     Lentigines - Scattered tan macules - Due to sun exposure - Benign-appearing, observe - Recommend daily broad spectrum sunscreen SPF 30+ to sun-exposed areas, reapply every 2 hours as needed. - Call for any changes  Seborrheic Keratoses - Stuck-on, waxy, tan-brown papules and/or plaques  - Benign-appearing - Discussed benign etiology and prognosis. - Observe - Call for any changes  Melanocytic Nevi - Tan-brown and/or pink-flesh-colored symmetric macules and papules - Benign appearing on exam today - Observation - Call clinic for new or changing moles - Recommend daily use of broad spectrum spf 30+ sunscreen to sun-exposed areas.   Hemangiomas - Red papules - Discussed benign nature - Observe - Call for any changes  Actinic Damage - Chronic condition, secondary to cumulative UV/sun exposure - diffuse scaly erythematous macules with underlying dyspigmentation - Recommend daily broad spectrum sunscreen SPF 30+ to sun-exposed areas, reapply every 2 hours as needed.  - Staying in the shade or wearing long sleeves, sun glasses (UVA+UVB protection) and wide brim hats (4-inch brim around the entire circumference of the hat) are also recommended for sun protection.  - Call for new or changing lesions.  Dermatofibroma - Firm pink/brown papulenodule with dimple sign left medial calf - Benign appearing - Call for any changes  Skin cancer screening performed today.  Return in about 6 months (around 03/21/2022) for AK follow up, 1 year FBSE.  Graciella Belton, RMA, am acting as scribe for Forest Gleason, MD .  Documentation: I have reviewed the above documentation for accuracy  and completeness, and I agree with the above.  Forest Gleason, MD

## 2021-09-28 ENCOUNTER — Encounter: Payer: Self-pay | Admitting: Dermatology

## 2021-10-10 NOTE — Progress Notes (Signed)
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 Chronic nocturia at least once at night - she drinks a lot of water in the evenings. She is on ARB , she has numbness on both feet - she stopped taking Lyrica since she thought it was not working but now discomfort is worse and wants to resume medications. Advised to titrate it up slowly. She has a history of microalbuminuria and is taking ARB, dyslipidemia and takes statin therapy We will add Rybelsus, discussed possible side effects. She denies family history of thyroid cancer or personal history of pancreatitis    Obesity:  she retired, she was losing weight, but gained a few pounds since last visit, but not happy that her weight seems to be on her waits. She is still walking her dog, also playing bat mitten now,she has not been riding her back lately. Eating a lot of fruit and vegetables.    HTN: she is on Benicar hctz  she is taking one pill daily, no chest pain, palpitation, SOB  BP is towards low end of normal, very seldom gets dizzy when she stands up quickly. Last visit bp was 136/84. Advised to get up slowly and stay hydrated, also asked her to check bp at home to let me know if it is staying below 120/70    Hyperlipidemia: she is now on Atorvastatin and denies side effects .LDL is at goal, last HDL was 55 and within normal limits We will recheck labs today    GERD: very rare now, takes Tums prn . Unchanged    Perennial AR with seasonal variation: She is on singulair, xyzal , Flonase and Astelin prn only, no problems at this time    Portal Vein Thrombosis: saw hematologist but released from their care in 2022 she is still on  Xarelto, she will be on that therapy for a life time, she denies any bleeding. She has  factor V leiden mutation . She was diagnosed with iron deficiency anemia of unknown cause, but HCT has been back to normal, no longer infusions but taking otc supplementation  We will recheck labs today    Constipation:  [place] usually a 4 with medication. She takes Miralax prn only .   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   Long term current use of anticoagulant therapy 01/05/2016   Factor 5 Leiden mutation, heterozygous ([place]) 05/02/2015   Portal vein thrombosis 11/07/2014   History of sepsis 10/10/2014   Abnormal electrocardiogram 10/08/2014   Nonspecific abnormal electromyogram (EMG) 10/08/2014   Benign essential HTN 10/08/2014   Diabetes mellitus with renal manifestation ([place]) 10/08/2014   Dyslipidemia 10/08/2014   LBP (low back pain) 10/08/2014   Gastro-esophageal reflux disease without esophagitis 10/08/2014   Microalbuminuria 10/08/2014   Disturbance of skin sensation 10/08/2014   Obesity (BMI 30-39.9) 10/08/2014   Perennial allergic rhinitis with seasonal variation 10/08/2014   Plantar fasciitis 10/08/2014  Postablative ovarian failure 10/08/2014   Menopausal symptom 10/08/2014   Vitamin D deficiency 10/08/2014    Past Surgical History:  Procedure Laterality Date   ABDOMINAL HYSTERECTOMY  2010   APPENDECTOMY  1966   COLONOSCOPY  06-07-13   Dr Bary Castilla   DILATION AND CURETTAGE OF UTERUS     OOPHORECTOMY      Family History  Problem Relation Age of Onset   Diabetes Mother    COPD Mother    Diabetes Father    Hypertension Father    Cancer Father        Kidney and Prostate   CAD Father    Diabetes Brother        Oldest Brother   Kidney disease Brother        Tumor removed   Breast cancer Neg Hx      Social History   Tobacco Use   Smoking status: Never   Smokeless tobacco: Never  Substance Use Topics   Alcohol use: No    Alcohol/week: 0.0 standard drinks of alcohol     Current Outpatient Medications:    atorvastatin (LIPITOR) 40 MG tablet, Take 1 tablet (40 mg total) by mouth daily., Disp: 90 tablet, Rfl: 3   azelastine (ASTELIN) 0.1 % nasal spray, Place 2 sprays into both nostrils 2 (two) times daily. Use in each nostril as directed, Disp: 30 mL, Rfl: 2   cholecalciferol (VITAMIN D) 1000 units tablet, Take 500 Units by mouth daily. , Disp: , Rfl:    fluticasone (FLONASE) 50 MCG/ACT nasal spray, USE 2 SPRAY(S) IN EACH NOSTRIL AS NEEDED, Disp: 48 g, Rfl: 0   glucose blood (ONE TOUCH ULTRA TEST) test strip, USE AS DIRECTED, Disp: 100 each, Rfl: 12   Iron-FA-B Cmp-C-Biot-Probiotic (FUSION PLUS PO), Take 1 tablet by mouth daily. Vitamin with iron, Disp: , Rfl:    levocetirizine (XYZAL) 5 MG tablet, Take 1 tablet by mouth once daily, Disp: 90 tablet, Rfl: 0   MAGNESIUM OXIDE PO, Take 500 mg by mouth daily. , Disp: , Rfl:    metFORMIN (GLUCOPHAGE-XR) 750 MG 24 hr tablet, Take 2 tablets (1,500 mg total) by mouth daily with breakfast., Disp: 180 tablet, Rfl: 1   montelukast (SINGULAIR) 10 MG tablet, TAKE 1 TABLET BY MOUTH AT BEDTIME, Disp: 90 tablet, Rfl: 0   olmesartan-hydrochlorothiazide (BENICAR HCT) 20-12.5 MG tablet, Take 1 tablet by mouth daily., Disp: 90 tablet, Rfl: 1   polyethylene glycol powder (GLYCOLAX/MIRALAX) powder, Take 17 g by mouth 2 (two) times daily., Disp: 3350 g, Rfl: 1   rivaroxaban (XARELTO) 20 MG TABS tablet, Take 1 tablet (20 mg total) by mouth daily with supper., Disp: 90 tablet, Rfl: 3   tiZANidine (ZANAFLEX) 2 MG tablet, Take 1 tablet (2 mg total) by mouth every 6 (six) hours as needed for muscle spasms., Disp: 30 tablet, Rfl: 0   triamcinolone cream (KENALOG) 0.1 %, Apply 1 Application topically 2 (two) times daily. For up to 2 weeks as needed for rash/itch.  Avoid applying to face, groin, and axilla. Use as directed. Long-term use can cause thinning of the skin., Disp: 80 g, Rfl: 1   vitamin B-12 (CYANOCOBALAMIN) 500 MCG tablet, Take 1,000 mcg by mouth daily., Disp: , Rfl:    pregabalin (LYRICA) 75 MG capsule, Take 1-2 capsules (75-150 mg total) by mouth 2 (two) times daily., Disp: 270 capsule, Rfl: 0  No Known Allergies  I personally reviewed active problem list, medication list, allergies, family history, social history, health maintenance with  the patient/caregiver today.   ROS  Constitutional: Negative for fever or weight change.  Respiratory: Negative for cough and shortness of breath.   Cardiovascular: Negative for chest pain or palpitations.  Gastrointestinal: Negative for abdominal pain, no bowel changes.  Musculoskeletal: Negative for gait problem or joint swelling.  Skin: Negative for rash.  Neurological:positive for intermittent  dizziness but no headache.  No other specific complaints in a complete review of systems (except as listed in HPI above).   Objective  Vitals:   10/13/21 0837  BP: 118/70  Pulse: 86  Resp: 16  Temp: 97.7 F (36.5 C)  TempSrc: Oral  SpO2: 96%  Weight: 220 lb 8 oz (100 kg)  Height: '5\' 9"'$  (1.753 m)    Body mass index is 32.56 kg/m.  Physical Exam  Constitutional: Patient appears well-developed and well-nourished. Obese  No distress.  HEENT: head atraumatic, normocephalic, pupils equal and reactive to light, neck supple Cardiovascular: Normal rate, regular rhythm and normal heart sounds.  No murmur heard. No BLE edema. Pulmonary/Chest: Effort normal and breath sounds normal. No respiratory distress. Abdominal: Soft.  There is no tenderness. Psychiatric: Patient has a normal mood and affect. behavior is normal. Judgment and thought content normal.   Recent Results (from the past 2160 hour(s))  HM DIABETES EYE EXAM     Status: None   Collection Time: 07/25/21 12:00 AM  Result Value Ref  Range   HM Diabetic Eye Exam No Retinopathy No Retinopathy    Comment: [place]  POCT HgB A1C     Status: Abnormal   Collection Time: 10/13/21  8:42 AM  Result Value Ref Range   Hemoglobin A1C 6.7 (A) 4.0 - 5.6 %   HbA1c POC (<> result, manual entry)     HbA1c, POC (prediabetic range)     HbA1c, POC (controlled diabetic range)      PHQ2/9:    10/13/2021    8:31 AM 08/19/2021    9:36 AM 05/13/2021    8:36 AM 04/22/2021    4:00 PM 01/02/2021    7:36 AM  Depression screen PHQ 2/9  Decreased Interest 0 0 0 0 0  Down, Depressed, Hopeless 0 0 0 0 0  PHQ - 2 Score 0 0 0 0 0  Altered sleeping 0 0 0  0  Tired, decreased energy 0 0 0  0  Change in appetite 0 0 0  0  Feeling bad or failure about yourself  0 0 0  0  Trouble concentrating 0 0 0  0  Moving slowly or fidgety/restless 0 0 0  0  Suicidal thoughts 0 0 0  0  PHQ-9 Score 0 0 0  0  Difficult doing work/chores Not difficult at all        phq 9 is negative   Fall Risk:    10/13/2021    8:31 AM 08/19/2021    9:36 AM 05/13/2021    8:36 AM 04/22/2021    4:02 PM 01/02/2021    7:36 AM  Fall Risk   Falls in the past year? 0 0 0 0 1  Number falls in past yr: 0 0 0 0 0  Injury with Fall? 0 0 0 0 0  Risk for fall due to : No Fall Risks No Fall Risks No Fall Risks No Fall Risks No Fall Risks  Follow up Falls prevention discussed;Education provided Falls prevention discussed Falls prevention discussed Falls prevention discussed Falls prevention discussed      Functional  Status Survey: Is the patient deaf or have difficulty hearing?: Yes Does the patient have difficulty seeing, even when wearing glasses/contacts?: No Does the patient have difficulty concentrating, remembering, or making decisions?: Yes Does the patient have difficulty walking or climbing stairs?: No Does the patient have difficulty dressing or bathing?: No Does the patient have difficulty doing errands alone such as visiting a doctor's office or shopping?:  No    Assessment & Plan  1. Type 2 diabetes mellitus with microalbuminuria, without long-term current use of insulin (HCC)  - POCT HgB A1C - metFORMIN (GLUCOPHAGE-XR) 750 MG 24 hr tablet; Take 2 tablets (1,500 mg total) by mouth daily with breakfast.  Dispense: 180 tablet; Refill: 1 - COMPLETE METABOLIC PANEL WITH GFR - Microalbumin / creatinine urine ratio - Semaglutide (RYBELSUS) 7 MG TABS; Take 7 mg by mouth daily.  Dispense: 90 tablet; Refill: 0  2. Controlled type 2 diabetes with neuropathy (HCC)  - pregabalin (LYRICA) 75 MG capsule; Take 1-2 capsules (75-150 mg total) by mouth 3 (three) times daily. Max 150 mg TID  Dispense: 270 capsule; Refill: 0  3. Factor 5 Leiden mutation, heterozygous ([place])   4. Hypertension associated with diabetes ([place])   5. Portal vein thrombosis   6. Vitamin D deficiency  - VITAMIN D 25 Hydroxy (Vit-D Deficiency, Fractures)  7. Benign essential HTN  - olmesartan-hydrochlorothiazide (BENICAR HCT) 20-12.5 MG tablet; Take 1 tablet by mouth daily.  Dispense: 90 tablet; Refill: 1  8. Low serum vitamin B12  - Vitamin B12  9. Perennial allergic rhinitis with seasonal variation  - montelukast (SINGULAIR) 10 MG tablet; Take 1 tablet (10 mg total) by mouth at bedtime.  Dispense: 90 tablet; Refill: 3  10. History of iron deficiency  - CBC with Differential/Platelet - Iron, TIBC and Ferritin Panel  11. Dyslipidemia  - Lipid panel  12. Intermittent low back pain  .

## 2021-10-13 ENCOUNTER — Ambulatory Visit: Payer: Medicare PPO | Admitting: Family Medicine

## 2021-10-13 ENCOUNTER — Encounter: Payer: Self-pay | Admitting: Family Medicine

## 2021-10-13 VITALS — BP 118/70 | HR 86 | Temp 97.7°F | Resp 16 | Ht 69.0 in | Wt 220.5 lb

## 2021-10-13 DIAGNOSIS — I81 Portal vein thrombosis: Secondary | ICD-10-CM | POA: Diagnosis not present

## 2021-10-13 DIAGNOSIS — E538 Deficiency of other specified B group vitamins: Secondary | ICD-10-CM | POA: Diagnosis not present

## 2021-10-13 DIAGNOSIS — R809 Proteinuria, unspecified: Secondary | ICD-10-CM | POA: Diagnosis not present

## 2021-10-13 DIAGNOSIS — D6851 Activated protein C resistance: Secondary | ICD-10-CM | POA: Diagnosis not present

## 2021-10-13 DIAGNOSIS — E785 Hyperlipidemia, unspecified: Secondary | ICD-10-CM | POA: Diagnosis not present

## 2021-10-13 DIAGNOSIS — E1129 Type 2 diabetes mellitus with other diabetic kidney complication: Secondary | ICD-10-CM | POA: Diagnosis not present

## 2021-10-13 DIAGNOSIS — E1159 Type 2 diabetes mellitus with other circulatory complications: Secondary | ICD-10-CM

## 2021-10-13 DIAGNOSIS — E1140 Type 2 diabetes mellitus with diabetic neuropathy, unspecified: Secondary | ICD-10-CM | POA: Diagnosis not present

## 2021-10-13 DIAGNOSIS — J3089 Other allergic rhinitis: Secondary | ICD-10-CM | POA: Diagnosis not present

## 2021-10-13 DIAGNOSIS — Z8639 Personal history of other endocrine, nutritional and metabolic disease: Secondary | ICD-10-CM

## 2021-10-13 DIAGNOSIS — J302 Other seasonal allergic rhinitis: Secondary | ICD-10-CM

## 2021-10-13 DIAGNOSIS — I152 Hypertension secondary to endocrine disorders: Secondary | ICD-10-CM

## 2021-10-13 DIAGNOSIS — E559 Vitamin D deficiency, unspecified: Secondary | ICD-10-CM

## 2021-10-13 DIAGNOSIS — I10 Essential (primary) hypertension: Secondary | ICD-10-CM | POA: Diagnosis not present

## 2021-10-13 DIAGNOSIS — M545 Low back pain, unspecified: Secondary | ICD-10-CM

## 2021-10-13 LAB — POCT GLYCOSYLATED HEMOGLOBIN (HGB A1C): Hemoglobin A1C: 6.7 % — AB (ref 4.0–5.6)

## 2021-10-13 MED ORDER — METFORMIN HCL ER 750 MG PO TB24: 1500.0000 mg | ORAL_TABLET | Freq: Every day | ORAL | 1 refills | Status: DC

## 2021-10-13 MED ORDER — RYBELSUS 7 MG PO TABS: 7.0000 mg | ORAL_TABLET | Freq: Every day | ORAL | 0 refills | Status: DC

## 2021-10-13 MED ORDER — MONTELUKAST SODIUM 10 MG PO TABS: 10.0000 mg | ORAL_TABLET | Freq: Every day | ORAL | 3 refills | Status: DC

## 2021-10-13 MED ORDER — PREGABALIN 75 MG PO CAPS: 75.0000 mg | ORAL_CAPSULE | Freq: Three times a day (TID) | ORAL | 0 refills | Status: DC

## 2021-10-13 MED ORDER — OLMESARTAN MEDOXOMIL-HCTZ 20-12.5 MG PO TABS: 1.0000 | ORAL_TABLET | Freq: Every day | ORAL | 1 refills | Status: DC

## 2021-10-14 LAB — CBC WITH DIFFERENTIAL/PLATELET
Absolute Monocytes: 664 cells/uL (ref 200–950)
Basophils Absolute: 42 cells/uL (ref 0–200)
Basophils Relative: 0.5 %
Eosinophils Absolute: 133 cells/uL (ref 15–500)
Eosinophils Relative: 1.6 %
HCT: 39.2 % (ref 35.0–45.0)
Hemoglobin: 13.2 g/dL (ref 11.7–15.5)
Lymphs Abs: 2507 cells/uL (ref 850–3900)
MCH: 28.3 pg (ref 27.0–33.0)
MCHC: 33.7 g/dL (ref 32.0–36.0)
MCV: 84.1 fL (ref 80.0–100.0)
MPV: 10.2 fL (ref 7.5–12.5)
Monocytes Relative: 8 %
Neutro Abs: 4955 cells/uL (ref 1500–7800)
Neutrophils Relative %: 59.7 %
Platelets: 194 10*3/uL (ref 140–400)
RBC: 4.66 10*6/uL (ref 3.80–5.10)
RDW: 15.3 % — ABNORMAL HIGH (ref 11.0–15.0)
Total Lymphocyte: 30.2 %
WBC: 8.3 10*3/uL (ref 3.8–10.8)

## 2021-10-14 LAB — COMPLETE METABOLIC PANEL WITH GFR
AG Ratio: 1.5 (calc) (ref 1.0–2.5)
ALT: 34 U/L — ABNORMAL HIGH (ref 6–29)
AST: 21 U/L (ref 10–35)
Albumin: 4.3 g/dL (ref 3.6–5.1)
Alkaline phosphatase (APISO): 82 U/L (ref 37–153)
BUN: 9 mg/dL (ref 7–25)
CO2: 29 mmol/L (ref 20–32)
Calcium: 10 mg/dL (ref 8.6–10.4)
Chloride: 101 mmol/L (ref 98–110)
Creat: 0.69 mg/dL (ref 0.50–1.05)
Globulin: 2.8 g/dL (calc) (ref 1.9–3.7)
Glucose, Bld: 104 mg/dL — ABNORMAL HIGH (ref 65–99)
Potassium: 4.6 mmol/L (ref 3.5–5.3)
Sodium: 140 mmol/L (ref 135–146)
Total Bilirubin: 0.6 mg/dL (ref 0.2–1.2)
Total Protein: 7.1 g/dL (ref 6.1–8.1)
eGFR: 95 mL/min/{1.73_m2} (ref >=60)

## 2021-10-14 LAB — VITAMIN B12: Vitamin B-12: 1544 pg/mL — ABNORMAL HIGH (ref 200–1100)

## 2021-10-14 LAB — LIPID PANEL
Cholesterol: 125 mg/dL (ref <200)
HDL: 53 mg/dL (ref >=50)
LDL Cholesterol (Calc): 48 mg/dL (calc)
Non-HDL Cholesterol (Calc): 72 mg/dL (calc) (ref <130)
Total CHOL/HDL Ratio: 2.4 (calc) (ref <5.0)
Triglycerides: 159 mg/dL — ABNORMAL HIGH (ref <150)

## 2021-10-14 LAB — MICROALBUMIN / CREATININE URINE RATIO
Creatinine, Urine: 15 mg/dL — ABNORMAL LOW (ref 20–275)
Microalb, Ur: <0.2 mg/dL

## 2021-10-14 LAB — IRON,TIBC AND FERRITIN PANEL
%SAT: 18 % (calc) (ref 16–45)
Ferritin: 20 ng/mL (ref 16–288)
Iron: 66 ug/dL (ref 45–160)
TIBC: 366 mcg/dL (calc) (ref 250–450)

## 2021-10-14 LAB — VITAMIN D 25 HYDROXY (VIT D DEFICIENCY, FRACTURES): Vit D, 25-Hydroxy: 60 ng/mL (ref 30–100)

## 2021-11-08 ENCOUNTER — Other Ambulatory Visit: Payer: Self-pay | Admitting: Family Medicine

## 2021-11-08 DIAGNOSIS — J302 Other seasonal allergic rhinitis: Secondary | ICD-10-CM

## 2021-12-15 ENCOUNTER — Ambulatory Visit (INDEPENDENT_AMBULATORY_CARE_PROVIDER_SITE_OTHER): Payer: Medicare PPO

## 2021-12-15 ENCOUNTER — Encounter: Payer: Self-pay | Admitting: Family Medicine

## 2021-12-15 DIAGNOSIS — Z23 Encounter for immunization: Secondary | ICD-10-CM

## 2021-12-16 ENCOUNTER — Other Ambulatory Visit: Payer: Self-pay | Admitting: Family Medicine

## 2021-12-31 ENCOUNTER — Ambulatory Visit: Payer: Self-pay

## 2021-12-31 NOTE — Telephone Encounter (Signed)
Chief Complaint: urine frequency, urgency Symptoms: burning, pain at the end of the stream 4/10, unable to control the flow when stand up Frequency: onset last night Pertinent Negatives: Patient denies flank pain, fever Disposition: '[]'$ ED /'[]'$ Urgent Care (no appt availability in office) / '[x]'$ Appointment(In office/virtual)/ '[]'$  [facility]/ '[]'$ Home Care/ '[]'$ Refused Recommended Disposition /'[]'$ [facility]/ '[]'$  Follow-up with PCP Additional Notes: No availability with PCP until Friday, scheduled with another provider tomorrow.   Summary: frequent urgency to urinate and burning   Patient called in with frequent urgency, but says nothing will hardly come out when she goes, and when she does its a burning feeling at the end. Only wanted to see Dr Ancil Boozer, but didn't want to wait until Friday and was too late to come today.      Reason for Disposition  Urinating more frequently than usual (i.e., frequency)  Answer Assessment - Initial Assessment Questions 1. SYMPTOM: "What's the main symptom you're concerned about?" (e.g., frequency, incontinence)     Frequency, incontinence 2. ONSET: "When did the symptoms start?"     Last night 12/30/21 3. PAIN: "Is there any pain?" If Yes, ask: "How bad is it?" (Scale: 1-10; mild, moderate, severe)     Only after urination a 4/10 4. CAUSE: "What do you think is causing the symptoms?"     UTI 5. OTHER SYMPTOMS: "Do you have any other symptoms?" (e.g., blood in urine, fever, flank pain, pain with urination)     Burning, pain with urination at the end  Protocols used: Urinary Symptoms-A-AH

## 2022-01-01 ENCOUNTER — Ambulatory Visit: Payer: Medicare PPO | Admitting: Internal Medicine

## 2022-01-01 ENCOUNTER — Encounter: Payer: Self-pay | Admitting: Internal Medicine

## 2022-01-01 VITALS — BP 114/70 | HR 88 | Temp 98.0°F | Resp 16 | Ht 69.0 in | Wt 209.9 lb

## 2022-01-01 DIAGNOSIS — R300 Dysuria: Secondary | ICD-10-CM | POA: Diagnosis not present

## 2022-01-01 LAB — POCT URINALYSIS DIPSTICK
Bilirubin, UA: NEGATIVE
Glucose, UA: NEGATIVE
Ketones, UA: NEGATIVE
Nitrite, UA: NEGATIVE
Protein, UA: NEGATIVE
Spec Grav, UA: 1.01 (ref 1.010–1.025)
Urobilinogen, UA: 0.2 E.U./dL
pH, UA: 5 (ref 5.0–8.0)

## 2022-01-01 MED ORDER — SULFAMETHOXAZOLE-TRIMETHOPRIM 800-160 MG PO TABS: 1.0000 | ORAL_TABLET | Freq: Two times a day (BID) | ORAL | 0 refills | Status: AC

## 2022-01-01 NOTE — Progress Notes (Signed)
Acute Office Visit  Subjective:     Patient ID: Emily Pitts, female    DOB: 01-10-1956, 66 y.o.   MRN: [id number]  Chief Complaint  Patient presents with   Dysuria   Urinary Frequency    Sx started Tuesday night    HPI Patient is in today for dysuria. Symptoms started 3 days ago.   URINARY SYMPTOMS Dysuria: yes Urinary frequency: no Urgency: yes Small volume voids: no Urinary incontinence: yes Foul odor: no Hematuria: no Abdominal pain: yes Back pain: yes Suprapubic pain/pressure: yes Flank pain: no Fever:  no Vomiting: no Previous urinary tract infection: no Recurrent urinary tract infection: no Change in vaginal discharge: no Treatments attempted: increasing fluids   Review of Systems  Constitutional:  Negative for chills and fever.  Gastrointestinal:  Positive for abdominal pain. Negative for nausea and vomiting.  Genitourinary:  Positive for dysuria and urgency. Negative for flank pain, frequency and hematuria.      Objective:    BP 114/70   Pulse 88   Temp 98 F (36.7 C) (Oral)   Resp 16   Ht '5\' 9"'$  (1.753 m)   Wt 209 lb 14.4 oz (95.2 kg)   SpO2 97%   BMI 31.00 kg/m  BP Readings from Last 3 Encounters:  01/01/22 114/70  10/13/21 118/70  08/19/21 136/84   Wt Readings from Last 3 Encounters:  01/01/22 209 lb 14.4 oz (95.2 kg)  10/13/21 220 lb 8 oz (100 kg)  08/19/21 228 lb (103.4 kg)      Physical Exam Constitutional:      Appearance: Normal appearance.  HENT:     Head: Normocephalic and atraumatic.  Eyes:     Conjunctiva/sclera: Conjunctivae normal.  Cardiovascular:     Rate and Rhythm: Normal rate and regular rhythm.  Pulmonary:     Effort: Pulmonary effort is normal.     Breath sounds: Normal breath sounds.  Abdominal:     Tenderness: There is no right CVA tenderness or left CVA tenderness.  Musculoskeletal:     Right lower leg: No edema.     Left lower leg: No edema.  Skin:    General: Skin is warm and dry.  Neurological:      General: No focal deficit present.     Mental Status: She is alert. Mental status is at baseline.  Psychiatric:        Mood and Affect: Mood normal.        Behavior: Behavior normal.     Results for orders placed or performed in visit on 01/01/22  POCT Urinalysis Dipstick  Result Value Ref Range   Color, UA Clear    Clarity, UA Clear    Glucose, UA Negative Negative   Bilirubin, UA Negative    Ketones, UA Negative    Spec Grav, UA 1.010 1.010 - 1.025   Blood, UA Large    pH, UA 5.0 5.0 - 8.0   Protein, UA Negative Negative   Urobilinogen, UA 0.2 0.2 or 1.0 E.U./dL   Nitrite, UA Negative    Leukocytes, UA Large (3+) (A) Negative   Appearance Clear    Odor Foull         Assessment & Plan:   1. Dysuria: Urine in the office showing 3+ leukocytes, large amount of blood, negative nitrates.  Will send urine for culture.  Due to urine dip results and symptoms, she will be empirically started on Bactrim to take twice a day for 3 days.  Follow-up  if symptoms worsen or fail to resolve.  - POCT Urinalysis Dipstick - Urine Culture - sulfamethoxazole-trimethoprim (BACTRIM DS) 800-160 MG tablet; Take 1 tablet by mouth 2 (two) times daily for 3 days.  Dispense: 6 tablet; Refill: 0  Return if symptoms worsen or fail to improve.  Teodora Medici, DO

## 2022-01-01 NOTE — Patient Instructions (Addendum)
It was great seeing you today!  Plan discussed at today's visit: -Bactrim antibiotic 1 pill twice a day for 3 days -Urine sent for culture, I will up date you on results  Follow up in: as needed  Take care and let us know if you have any questions or concerns prior to your next visit.  Dr. Rosana Berger

## 2022-01-03 LAB — URINE CULTURE
MICRO NUMBER:: 14105939
SPECIMEN QUALITY:: ADEQUATE

## 2022-01-07 ENCOUNTER — Other Ambulatory Visit: Payer: Self-pay | Admitting: Family Medicine

## 2022-01-07 DIAGNOSIS — R809 Proteinuria, unspecified: Secondary | ICD-10-CM

## 2022-01-13 ENCOUNTER — Other Ambulatory Visit: Payer: Self-pay | Admitting: Family Medicine

## 2022-01-13 DIAGNOSIS — I81 Portal vein thrombosis: Secondary | ICD-10-CM

## 2022-01-13 DIAGNOSIS — D6851 Activated protein C resistance: Secondary | ICD-10-CM

## 2022-01-28 ENCOUNTER — Other Ambulatory Visit: Payer: Self-pay | Admitting: Family Medicine

## 2022-01-28 DIAGNOSIS — E785 Hyperlipidemia, unspecified: Secondary | ICD-10-CM

## 2022-02-04 ENCOUNTER — Other Ambulatory Visit: Payer: Self-pay | Admitting: Family Medicine

## 2022-02-04 DIAGNOSIS — Z1231 Encounter for screening mammogram for malignant neoplasm of breast: Secondary | ICD-10-CM

## 2022-02-11 NOTE — Progress Notes (Signed)
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 She also lost weight and is happy about it   .She denies blurred vision, polyphagia, polyuria or polydipsia. Chronic nocturia at least once at night - she drinks a lot of water in the evenings. She is on ARB for microalbuminuria  and lyrica for diabetic neuropathy - she states still having pain at night and we will adjust dose from 75 mg Three daily to 100 mg up to 4 daily , continue statin therapy for dyslipidemia   Obesity:  she retired, she is very happy about the weight loss that she achieved since she started on Rybelsus    HTN: she is on Benicar hctz  she is taking one pill daily, no chest pain, palpitation, SOB  BP is at goal today.     Hyperlipidemia: she is now on Atorvastatin and denies side effects LDL is at goal   Perennial AR with seasonal variation: She is on singulair, xyzal , Flonase and Astelin prn only, doing well    Portal Vein Thrombosis: saw hematologist but released from their care in 2022 she is still on Xarelto, she will be on that therapy for a life time, she denies any bleeding. She has  factor V leiden mutation . 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   Long term current use of anticoagulant therapy  01/05/2016   Factor 5 Leiden mutation, heterozygous (HCC) 05/02/2015   Portal vein thrombosis 11/07/2014   History of sepsis 10/10/2014   Abnormal electrocardiogram 10/08/2014   Nonspecific abnormal electromyogram (EMG) 10/08/2014   Benign essential HTN 10/08/2014   Controlled type 2 diabetes with neuropathy (HCC) 10/08/2014   Dyslipidemia 10/08/2014   LBP (low back pain) 10/08/2014   Gastro-esophageal reflux disease without esophagitis 10/08/2014   Microalbuminuria 10/08/2014   Disturbance of skin sensation 10/08/2014   Obesity (BMI 30-39.9) 10/08/2014   Perennial allergic rhinitis with seasonal variation 10/08/2014   Plantar fasciitis 10/08/2014   Postablative ovarian failure 10/08/2014   Menopausal symptom 10/08/2014   Vitamin D deficiency 10/08/2014    Past Surgical History:  Procedure Laterality Date   ABDOMINAL HYSTERECTOMY  2010   APPENDECTOMY  1966   COLONOSCOPY  06-07-13   Dr Lemar Livings   DILATION AND CURETTAGE OF UTERUS     OOPHORECTOMY      Family History  Problem Relation Age of Onset   Diabetes Mother    COPD Mother    Diabetes Father    Hypertension Father    Cancer Father        Kidney and Prostate   CAD Father    Diabetes Brother        Oldest Brother  Kidney disease Brother        Tumor removed   Breast cancer Neg Hx     Social History   Tobacco Use   Smoking status: Never   Smokeless tobacco: Never  Substance Use Topics   Alcohol use: No    Alcohol/week: 0.0 standard drinks of alcohol     Current Outpatient Medications:    azelastine (ASTELIN) 0.1 % nasal spray, Place 2 sprays into both nostrils 2 (two) times daily. Use in each nostril as directed, Disp: 30 mL, Rfl: 2   cholecalciferol (VITAMIN D) 1000 units tablet, Take 500 Units by mouth daily. , Disp: , Rfl:    fluticasone (FLONASE) 50 MCG/ACT nasal spray, USE 2 SPRAY(S) IN EACH NOSTRIL AS NEEDED, Disp: 48 g, Rfl: 0   glucose blood (ONE TOUCH ULTRA TEST) test strip, USE AS DIRECTED, Disp:  100 each, Rfl: 12   Iron-FA-B Cmp-C-Biot-Probiotic (FUSION PLUS PO), Take 1 tablet by mouth daily. Vitamin with iron, Disp: , Rfl:    levocetirizine (XYZAL) 5 MG tablet, Take 1 tablet by mouth once daily, Disp: 90 tablet, Rfl: 0   MAGNESIUM OXIDE PO, Take 500 mg by mouth daily. , Disp: , Rfl:    metFORMIN (GLUCOPHAGE-XR) 750 MG 24 hr tablet, Take 2 tablets (1,500 mg total) by mouth daily with breakfast., Disp: 180 tablet, Rfl: 1   montelukast (SINGULAIR) 10 MG tablet, Take 1 tablet (10 mg total) by mouth at bedtime., Disp: 90 tablet, Rfl: 3   olmesartan-hydrochlorothiazide (BENICAR HCT) 20-12.5 MG tablet, Take 1 tablet by mouth daily., Disp: 90 tablet, Rfl: 1   polyethylene glycol powder (GLYCOLAX/MIRALAX) powder, Take 17 g by mouth 2 (two) times daily., Disp: 3350 g, Rfl: 1   pregabalin (LYRICA) 100 MG capsule, Take 1-2 capsules (100-200 mg total) by mouth 3 (three) times daily., Disp: 360 capsule, Rfl: 0   Semaglutide (RYBELSUS) 14 MG TABS, Take 1 tablet (14 mg total) by mouth daily., Disp: 90 tablet, Rfl: 1   tiZANidine (ZANAFLEX) 2 MG tablet, Take 1 tablet (2 mg total) by mouth every 6 (six) hours as needed for muscle spasms., Disp: 30 tablet, Rfl: 0   triamcinolone cream (KENALOG) 0.1 %, Apply 1 Application topically 2 (two) times daily. For up to 2 weeks as needed for rash/itch. Avoid applying to face, groin, and axilla. Use as directed. Long-term use can cause thinning of the skin., Disp: 80 g, Rfl: 1   vitamin B-12 (CYANOCOBALAMIN) 500 MCG tablet, Take 1,000 mcg by mouth daily., Disp: , Rfl:    atorvastatin (LIPITOR) 40 MG tablet, Take 1 tablet (40 mg total) by mouth daily., Disp: 90 tablet, Rfl: 1   rivaroxaban (XARELTO) 20 MG TABS tablet, Take 1 tablet (20 mg total) by mouth daily with supper., Disp: 90 tablet, Rfl: 1  No Known Allergies  I personally reviewed active problem list, medication list, allergies, family history, social history, health maintenance with the patient/caregiver  today.   ROS  Constitutional: Negative for fever , positive for weight change.  Respiratory: Negative for cough and shortness of breath.   Cardiovascular: Negative for chest pain or palpitations.  Gastrointestinal: Negative for abdominal pain, no bowel changes.  Musculoskeletal: Negative for gait problem or joint swelling.  Skin: Negative for rash.  Neurological: Negative for dizziness or headache.  No other specific complaints in a complete review of systems (except as listed in HPI above).   Objective  Vitals:   02/12/22 0833  BP: 122/68  Pulse: 90  Resp: 16  SpO2: 98%  Weight: 205 lb (93 kg)  Height: 5\' 9"  (1.753 m)    Body mass index is 30.27 kg/m.  Physical Exam  Constitutional: Patient appears well-developed and well-nourished. Obese  No distress.  HEENT: head atraumatic, normocephalic, pupils equal and reactive to light, neck supple Cardiovascular: Normal rate, regular rhythm and normal heart sounds.  No murmur heard. No BLE edema. Pulmonary/Chest: Effort normal and breath sounds normal. No respiratory distress. Abdominal: Soft.  There is no tenderness. Psychiatric: Patient has a normal mood and affect. behavior is normal. Judgment and thought content normal.   Recent Results (from the past 2160 hour(s))  POCT Urinalysis Dipstick     Status: Abnormal   Collection Time: 01/01/22  8:07 AM  Result Value Ref Range   Color, UA Clear    Clarity, UA Clear    Glucose, UA Negative Negative   Bilirubin, UA Negative    Ketones, UA Negative    Spec Grav, UA 1.010 1.010 - 1.025   Blood, UA Large    pH, UA 5.0 5.0 - 8.0   Protein, UA Negative Negative   Urobilinogen, UA 0.2 0.2 or 1.0 E.U./dL   Nitrite, UA Negative    Leukocytes, UA Large (3+) (A) Negative   Appearance Clear    Odor Foull   Urine Culture     Status: None   Collection Time: 01/01/22  8:28 AM   Specimen: Urine  Result Value Ref Range   MICRO NUMBER: [id number]    SPECIMEN QUALITY: Adequate    Sample  Source URINE    STATUS: FINAL    Result:      Less than 10,000 CFU/mL of single Gram positive organism isolated. No further testing will be performed. If clinically indicated, recollection using a method to minimize contamination, with prompt transfer to Urine Culture Transport Tube, is recommended.    PHQ2/9:    02/12/2022    8:33 AM 01/01/2022    7:59 AM 10/13/2021    8:31 AM 08/19/2021    9:36 AM 05/13/2021    8:36 AM  Depression screen PHQ 2/9  Decreased Interest 0 0 0 0 0  Down, Depressed, Hopeless 0 0 0 0 0  PHQ - 2 Score 0 0 0 0 0  Altered sleeping 3 0 0 0 0  Tired, decreased energy 0 0 0 0 0  Change in appetite 0 0 0 0 0  Feeling bad or failure about yourself  0 0 0 0 0  Trouble concentrating 0 0 0 0 0  Moving slowly or fidgety/restless 0 0 0 0 0  Suicidal thoughts 0 0 0 0 0  PHQ-9 Score 3 0 0 0 0  Difficult doing work/chores  Not difficult at all Not difficult at all      phq 9 is negative   Fall Risk:    02/12/2022    8:33 AM 01/01/2022    7:59 AM 10/13/2021    8:31 AM 08/19/2021    9:36 AM 05/13/2021    8:36 AM  Fall Risk   Falls in the past year? 0 0 0 0 0  Number falls in past yr: 0 0 0 0 0  Injury with Fall? 0 0 0 0 0  Risk for fall due to : No Fall Risks No Fall Risks No Fall Risks No Fall Risks No Fall Risks  Follow up Falls prevention discussed Falls prevention discussed;Education provided Falls prevention discussed;Education provided Falls prevention discussed Falls prevention discussed  Functional Status Survey: Is the patient deaf or have difficulty hearing?: Yes Does the patient have difficulty seeing, even when wearing glasses/contacts?: No Does the patient have difficulty concentrating, remembering, or making decisions?: Yes Does the patient have difficulty walking or climbing stairs?: No Does the patient have difficulty dressing or bathing?: No Does the patient have difficulty doing errands alone such as visiting a doctor's office or shopping?:  No    Assessment & Plan  1. Type 2 diabetes mellitus with microalbuminuria, without long-term current use of insulin (HCC)  - HM Diabetes Foot Exam  2. Dyslipidemia  - atorvastatin (LIPITOR) 40 MG tablet; Take 1 tablet (40 mg total) by mouth daily.  Dispense: 90 tablet; Refill: 1  3. Controlled type 2 diabetes with neuropathy (HCC)  - pregabalin (LYRICA) 100 MG capsule; Take 1-2 capsules (100-200 mg total) by mouth 3 (three) times daily.  Dispense: 360 capsule; Refill: 0 - Semaglutide (RYBELSUS) 14 MG TABS; Take 1 tablet (14 mg total) by mouth daily.  Dispense: 90 tablet; Refill: 1  4. Portal vein thrombosis  - rivaroxaban (XARELTO) 20 MG TABS tablet; Take 1 tablet (20 mg total) by mouth daily with supper.  Dispense: 90 tablet; Refill: 1  5. Factor 5 Leiden mutation, heterozygous (HCC)  - rivaroxaban (XARELTO) 20 MG TABS tablet; Take 1 tablet (20 mg total) by mouth daily with supper.  Dispense: 90 tablet; Refill: 1   6. Vitamin D deficiency   7. Gastro-esophageal reflux disease without esophagitis   8. Dyslipidemia  - atorvastatin (LIPITOR) 40 MG tablet; Take 1 tablet (40 mg total) by mouth daily.  Dispense: 90 tablet; Refill: 1  9. Low serum vitamin B12   10. Perennial allergic rhinitis with seasonal variation

## 2022-02-12 ENCOUNTER — Encounter: Payer: Self-pay | Admitting: Family Medicine

## 2022-02-12 ENCOUNTER — Ambulatory Visit: Payer: Medicare PPO | Admitting: Family Medicine

## 2022-02-12 VITALS — BP 122/68 | HR 90 | Resp 16 | Ht 69.0 in | Wt 205.0 lb

## 2022-02-12 DIAGNOSIS — D6851 Activated protein C resistance: Secondary | ICD-10-CM

## 2022-02-12 DIAGNOSIS — J3089 Other allergic rhinitis: Secondary | ICD-10-CM

## 2022-02-12 DIAGNOSIS — K219 Gastro-esophageal reflux disease without esophagitis: Secondary | ICD-10-CM | POA: Diagnosis not present

## 2022-02-12 DIAGNOSIS — E1140 Type 2 diabetes mellitus with diabetic neuropathy, unspecified: Secondary | ICD-10-CM

## 2022-02-12 DIAGNOSIS — E559 Vitamin D deficiency, unspecified: Secondary | ICD-10-CM

## 2022-02-12 DIAGNOSIS — E785 Hyperlipidemia, unspecified: Secondary | ICD-10-CM

## 2022-02-12 DIAGNOSIS — I81 Portal vein thrombosis: Secondary | ICD-10-CM

## 2022-02-12 DIAGNOSIS — I152 Hypertension secondary to endocrine disorders: Secondary | ICD-10-CM

## 2022-02-12 DIAGNOSIS — E538 Deficiency of other specified B group vitamins: Secondary | ICD-10-CM

## 2022-02-12 DIAGNOSIS — J302 Other seasonal allergic rhinitis: Secondary | ICD-10-CM

## 2022-02-12 DIAGNOSIS — E1159 Type 2 diabetes mellitus with other circulatory complications: Secondary | ICD-10-CM | POA: Diagnosis not present

## 2022-02-12 DIAGNOSIS — R809 Proteinuria, unspecified: Secondary | ICD-10-CM

## 2022-02-12 DIAGNOSIS — E1129 Type 2 diabetes mellitus with other diabetic kidney complication: Secondary | ICD-10-CM

## 2022-02-12 MED ORDER — PREGABALIN 100 MG PO CAPS: 100.0000 mg | ORAL_CAPSULE | Freq: Three times a day (TID) | ORAL | 0 refills | Status: DC

## 2022-02-12 MED ORDER — RYBELSUS 14 MG PO TABS: 14.0000 mg | ORAL_TABLET | Freq: Every day | ORAL | 1 refills | Status: DC

## 2022-02-12 MED ORDER — ATORVASTATIN CALCIUM 40 MG PO TABS: 40.0000 mg | ORAL_TABLET | Freq: Every day | ORAL | 1 refills | Status: DC

## 2022-02-12 MED ORDER — RIVAROXABAN 20 MG PO TABS: 20.0000 mg | ORAL_TABLET | Freq: Every day | ORAL | 1 refills | Status: DC

## 2022-02-13 ENCOUNTER — Other Ambulatory Visit: Payer: Self-pay | Admitting: Family Medicine

## 2022-02-13 DIAGNOSIS — J302 Other seasonal allergic rhinitis: Secondary | ICD-10-CM

## 2022-03-10 ENCOUNTER — Ambulatory Visit
Admission: RE | Admit: 2022-03-10 | Discharge: 2022-03-10 | Disposition: A | Discharge status: Home / Self Care | Payer: Medicare PPO | Source: Ambulatory Visit | Attending: Family Medicine | Admitting: Family Medicine

## 2022-03-10 DIAGNOSIS — Z1231 Encounter for screening mammogram for malignant neoplasm of breast: Secondary | ICD-10-CM | POA: Diagnosis not present

## 2022-03-25 ENCOUNTER — Ambulatory Visit: Payer: Medicare PPO | Admitting: Dermatology

## 2022-04-23 ENCOUNTER — Ambulatory Visit (INDEPENDENT_AMBULATORY_CARE_PROVIDER_SITE_OTHER): Payer: Medicare PPO

## 2022-04-23 VITALS — Ht 69.0 in | Wt 205.0 lb

## 2022-04-23 DIAGNOSIS — Z0000 Encounter for general adult medical examination without abnormal findings: Secondary | ICD-10-CM

## 2022-04-23 NOTE — Patient Instructions (Signed)
Emily Pitts , Thank you for taking time to come for your Medicare Wellness Visit. I appreciate your ongoing commitment to your health goals. Please review the following plan we discussed and let me know if I can assist you in the future.   These are the goals we discussed:  Goals   None     This is a list of the screening recommended for you and due dates:  Health Maintenance  Topic Date Due   COVID-19 Vaccine (4 - 2023-24 season) 11/07/2021   Hemoglobin A1C  04/15/2022   Eye exam for diabetics  07/26/2022   Yearly kidney function blood test for diabetes  10/14/2022   Yearly kidney health urinalysis for diabetes  10/14/2022   Complete foot exam   02/13/2023   Medicare Annual Wellness Visit  04/24/2023   Colon Cancer Screening  06/08/2023   Mammogram  03/10/2024   DTaP/Tdap/Td vaccine (3 - Td or Tdap) 01/01/2029   Pneumonia Vaccine  Completed   Flu Shot  Completed   DEXA scan (bone density measurement)  Completed   Hepatitis C Screening: USPSTF Recommendation to screen - Ages 26-79 yo.  Completed   Zoster (Shingles) Vaccine  Completed   HPV Vaccine  Aged Out    Advanced directives: no  Conditions/risks identified: none  Next appointment: Follow up in one year for your annual wellness visit 04/29/2023 @2$ :30pm telephone   Preventive Care 65 Years and Older, Female Preventive care refers to lifestyle choices and visits with your health care provider that can promote health and wellness. What does preventive care include? A yearly physical exam. This is also called an annual well check. Dental exams once or twice a year. Routine eye exams. Ask your health care provider how often you should have your eyes checked. Personal lifestyle choices, including: Daily care of your teeth and gums. Regular physical activity. Eating a healthy diet. Avoiding tobacco and drug use. Limiting alcohol use. Practicing safe sex. Taking low-dose aspirin every day. Taking vitamin and mineral  supplements as recommended by your health care provider. What happens during an annual well check? The services and screenings done by your health care provider during your annual well check will depend on your age, overall health, lifestyle risk factors, and family history of disease. Counseling  Your health care provider may ask you questions about your: Alcohol use. Tobacco use. Drug use. Emotional well-being. Home and relationship well-being. Sexual activity. Eating habits. History of falls. Memory and ability to understand (cognition). Work and work Statistician. Reproductive health. Screening  You may have the following tests or measurements: Height, weight, and BMI. Blood pressure. Lipid and cholesterol levels. These may be checked every 5 years, or more frequently if you are over 41 years old. Skin check. Lung cancer screening. You may have this screening every year starting at age 42 if you have a 30-pack-year history of smoking and currently smoke or have quit within the past 15 years. Fecal occult blood test (FOBT) of the stool. You may have this test every year starting at age 62. Flexible sigmoidoscopy or colonoscopy. You may have a sigmoidoscopy every 5 years or a colonoscopy every 10 years starting at age 29. Hepatitis C blood test. Hepatitis B blood test. Sexually transmitted disease (STD) testing. Diabetes screening. This is done by checking your blood sugar (glucose) after you have not eaten for a while (fasting). You may have this done every 1-3 years. Bone density scan. This is done to screen for osteoporosis. You may have  this done starting at age 80. Mammogram. This may be done every 1-2 years. Talk to your health care provider about how often you should have regular mammograms. Talk with your health care provider about your test results, treatment options, and if necessary, the need for more tests. Vaccines  Your health care provider may recommend certain  vaccines, such as: Influenza vaccine. This is recommended every year. Tetanus, diphtheria, and acellular pertussis (Tdap, Td) vaccine. You may need a Td booster every 10 years. Zoster vaccine. You may need this after age 29. Pneumococcal 13-valent conjugate (PCV13) vaccine. One dose is recommended after age 101. Pneumococcal polysaccharide (PPSV23) vaccine. One dose is recommended after age 60. Talk to your health care provider about which screenings and vaccines you need and how often you need them. This information is not intended to replace advice given to you by your health care provider. Make sure you discuss any questions you have with your health care provider. Document Released: 03/22/2015 Document Revised: 11/13/2015 Document Reviewed: 12/25/2014 Elsevier Interactive Patient Education  2017 [place] Prevention in the Home Falls can cause injuries. They can happen to people of all ages. There are many things you can do to make your home safe and to help prevent falls. What can I do on the outside of my home? Regularly fix the edges of walkways and driveways and fix any cracks. Remove anything that might make you trip as you walk through a door, such as a raised step or threshold. Trim any bushes or trees on the path to your home. Use bright outdoor lighting. Clear any walking paths of anything that might make someone trip, such as rocks or tools. Regularly check to see if handrails are loose or broken. Make sure that both sides of any steps have handrails. Any raised decks and porches should have guardrails on the edges. Have any leaves, snow, or ice cleared regularly. Use sand or salt on walking paths during winter. Clean up any spills in your garage right away. This includes oil or grease spills. What can I do in the bathroom? Use night lights. Install grab bars by the toilet and in the tub and shower. Do not use towel bars as grab bars. Use non-skid mats or decals in  the tub or shower. If you need to sit down in the shower, use a plastic, non-slip stool. Keep the floor dry. Clean up any water that spills on the floor as soon as it happens. Remove soap buildup in the tub or shower regularly. Attach bath mats securely with double-sided non-slip rug tape. Do not have throw rugs and other things on the floor that can make you trip. What can I do in the bedroom? Use night lights. Make sure that you have a light by your bed that is easy to reach. Do not use any sheets or blankets that are too big for your bed. They should not hang down onto the floor. Have a firm chair that has side arms. You can use this for support while you get dressed. Do not have throw rugs and other things on the floor that can make you trip. What can I do in the kitchen? Clean up any spills right away. Avoid walking on wet floors. Keep items that you use a lot in easy-to-reach places. If you need to reach something above you, use a strong step stool that has a grab bar. Keep electrical cords out of the way. Do not use floor polish or  wax that makes floors slippery. If you must use wax, use non-skid floor wax. Do not have throw rugs and other things on the floor that can make you trip. What can I do with my stairs? Do not leave any items on the stairs. Make sure that there are handrails on both sides of the stairs and use them. Fix handrails that are broken or loose. Make sure that handrails are as long as the stairways. Check any carpeting to make sure that it is firmly attached to the stairs. Fix any carpet that is loose or worn. Avoid having throw rugs at the top or bottom of the stairs. If you do have throw rugs, attach them to the floor with carpet tape. Make sure that you have a light switch at the top of the stairs and the bottom of the stairs. If you do not have them, ask someone to add them for you. What else can I do to help prevent falls? Wear shoes that: Do not have high  heels. Have rubber bottoms. Are comfortable and fit you well. Are closed at the toe. Do not wear sandals. If you use a stepladder: Make sure that it is fully opened. Do not climb a closed stepladder. Make sure that both sides of the stepladder are locked into place. Ask someone to hold it for you, if possible. Clearly mark and make sure that you can see: Any grab bars or handrails. First and last steps. Where the edge of each step is. Use tools that help you move around (mobility aids) if they are needed. These include: Canes. Walkers. Scooters. Crutches. Turn on the lights when you go into a dark area. Replace any light bulbs as soon as they burn out. Set up your furniture so you have a clear path. Avoid moving your furniture around. If any of your floors are uneven, fix them. If there are any pets around you, be aware of where they are. Review your medicines with your doctor. Some medicines can make you feel dizzy. This can increase your chance of falling. Ask your doctor what other things that you can do to help prevent falls. This information is not intended to replace advice given to you by your health care provider. Make sure you discuss any questions you have with your health care provider. Document Released: 12/20/2008 Document Revised: 08/01/2015 Document Reviewed: 03/30/2014 Elsevier Interactive Patient Education  2017 [employer].

## 2022-04-23 NOTE — Progress Notes (Signed)
I connected with  Emily Pitts on 04/23/22 by a audio enabled telemedicine application and verified that I am speaking with the correct person using two identifiers.  Patient Location: Home  Provider Location: Office/Clinic  I discussed the limitations of evaluation and management by telemedicine. The patient expressed understanding and agreed to proceed.  Subjective:   Emily Pitts is a 67 y.o. female who presents for Medicare Annual (Subsequent) preventive examination.  Review of Systems           Objective:    Today's Vitals   04/23/22 1536  Weight: 205 lb (93 kg)  Height: 5' 9"$  (1.753 m)   Body mass index is 30.27 kg/m.     04/23/2022    3:49 PM 04/22/2021    4:01 PM 11/05/2020    9:41 AM 11/06/2018    3:26 PM 05/05/2018    2:24 PM 11/04/2017    3:35 PM 05/06/2017    3:02 PM  Advanced Directives  Does Patient Have a Medical Advance Directive? No No No No No No No  Would patient like information on creating a medical advance directive? Yes (ED - Information included in AVS) Yes (MAU/Ambulatory/Procedural Areas - Information given) No - Patient declined  No - Patient declined      Current Medications (verified) Outpatient Encounter Medications as of 04/23/2022  Medication Sig   atorvastatin (LIPITOR) 40 MG tablet Take 1 tablet (40 mg total) by mouth daily.   azelastine (ASTELIN) 0.1 % nasal spray Place 2 sprays into both nostrils 2 (two) times daily. Use in each nostril as directed   cholecalciferol (VITAMIN D) 1000 units tablet Take 500 Units by mouth daily.    fluticasone (FLONASE) 50 MCG/ACT nasal spray USE 2 SPRAY(S) IN EACH NOSTRIL AS NEEDED   glucose blood (ONE TOUCH ULTRA TEST) test strip USE AS DIRECTED   Iron-FA-B Cmp-C-Biot-Probiotic (FUSION PLUS PO) Take 1 tablet by mouth daily. Vitamin with iron   levocetirizine (XYZAL) 5 MG tablet Take 1 tablet by mouth once daily   MAGNESIUM OXIDE PO Take 500 mg by mouth daily.    metFORMIN (GLUCOPHAGE-XR) 750 MG 24 hr  tablet Take 2 tablets (1,500 mg total) by mouth daily with breakfast.   montelukast (SINGULAIR) 10 MG tablet Take 1 tablet (10 mg total) by mouth at bedtime.   olmesartan-hydrochlorothiazide (BENICAR HCT) 20-12.5 MG tablet Take 1 tablet by mouth daily.   polyethylene glycol powder (GLYCOLAX/MIRALAX) powder Take 17 g by mouth 2 (two) times daily.   pregabalin (LYRICA) 100 MG capsule Take 1-2 capsules (100-200 mg total) by mouth 3 (three) times daily.   rivaroxaban (XARELTO) 20 MG TABS tablet Take 1 tablet (20 mg total) by mouth daily with supper.   Semaglutide (RYBELSUS) 14 MG TABS Take 1 tablet (14 mg total) by mouth daily.   tiZANidine (ZANAFLEX) 2 MG tablet Take 1 tablet (2 mg total) by mouth every 6 (six) hours as needed for muscle spasms.   vitamin B-12 (CYANOCOBALAMIN) 500 MCG tablet Take 1,000 mcg by mouth daily.   triamcinolone cream (KENALOG) 0.1 % Apply 1 Application topically 2 (two) times daily. For up to 2 weeks as needed for rash/itch. Avoid applying to face, groin, and axilla. Use as directed. Long-term use can cause thinning of the skin.   No facility-administered encounter medications on file as of 04/23/2022.    Allergies (verified) Patient has no known allergies.   History: Past Medical History:  Diagnosis Date   Allergy    Diabetes mellitus without  complication ([place]) 0000000   Hyperlipidemia    Hypertension    Low back pain, episodic    Numbness of feet    Vitamin D deficiency    Past Surgical History:  Procedure Laterality Date   ABDOMINAL HYSTERECTOMY  2010   APPENDECTOMY  1966   COLONOSCOPY  06-07-13   Dr Bary Castilla   DILATION AND CURETTAGE OF UTERUS     OOPHORECTOMY     Family History  Problem Relation Age of Onset   Diabetes Mother    COPD Mother    Diabetes Father    Hypertension Father    Cancer Father        Kidney and Prostate   CAD Father    Diabetes Brother        Oldest Brother   Kidney disease Brother        Tumor removed   Breast cancer Neg Hx     Social History   Socioeconomic History   Marital status: Single    Spouse name: Not on file   Number of children: 0   Years of education: Not on file   Highest education level: 12th grade  Occupational History   Not on file  Tobacco Use   Smoking status: Never   Smokeless tobacco: Never  Vaping Use   Vaping Use: Never used  Substance and Sexual Activity   Alcohol use: No    Alcohol/week: 0.0 standard drinks of alcohol   Drug use: No   Sexual activity: Never  Other Topics Concern   Not on file  Social History Narrative   Not on file   Social Determinants of Health   Financial Resource Strain: Low Risk  (04/23/2022)   Overall Financial Resource Strain (CARDIA)    Difficulty of Paying Living Expenses: Not hard at all  Food Insecurity: No Food Insecurity (04/23/2022)   Hunger Vital Sign    Worried About Running Out of Food in the Last Year: Never true    [place] in the Last Year: Never true  Transportation Needs: No Transportation Needs (04/23/2022)   PRAPARE - Hydrologist (Medical): No    Lack of Transportation (Non-Medical): No  Physical Activity: Inactive (04/23/2022)   Exercise Vital Sign    Days of Exercise per Week: 0 days    Minutes of Exercise per Session: 0 min  Stress: No Stress Concern Present (04/23/2022)   [place]    Feeling of Stress : Not at all  Social Connections: Moderately Integrated (04/23/2022)   Social Connection and Isolation Panel [NHANES]    Frequency of Communication with Friends and Family: More than three times a week    Frequency of Social Gatherings with Friends and Family: More than three times a week    Attends Religious Services: More than 4 times per year    Active Member of [employer] or Organizations: Yes    Attends Music therapist: More than 4 times per year    Marital Status: Never married    Tobacco  Counseling Counseling given: Not Answered   Clinical Intake:  Pre-visit preparation completed: Yes  Pain : No/denies pain     BMI - recorded: 30.27 Nutritional Status: BMI > 30  Obese Nutritional Risks: None Diabetes: Yes CBG done?: No Did pt. bring in CBG monitor from home?: No (televisit)  How often do you need to have someone help you when you read instructions, pamphlets, or  other written materials from your doctor or pharmacy?: 1 - Never  Diabetic?yes  Interpreter Needed?: No  Information entered by :: B.Tilla Wilborn,LPN   Activities of Daily Living    04/23/2022    3:49 PM 02/12/2022    8:33 AM  In your present state of health, do you have any difficulty performing the following activities:  Hearing? 0 1  Vision? 0 0  Difficulty concentrating or making decisions? 0 1  Walking or climbing stairs? 0 0  Dressing or bathing? 0 0  Doing errands, shopping? 0 0  Preparing Food and eating ? N   Using the Toilet? N   In the past six months, have you accidently leaked urine? N   Do you have problems with loss of bowel control? N   Managing your Medications? N   Managing your Finances? N   Housekeeping or managing your Housekeeping? N     Patient Care Team: Steele Sizer, MD as PCP - General (Family Medicine) Laurence Ferrari, [place], MD as Consulting Physician (Dermatology)  Indicate any recent Medical Services you may have received from other than [facility] providers in the past year (date may be approximate).     Assessment:   This is a routine wellness examination for Emily Pitts.  Hearing/Vision screen Hearing Screening - Comments:: Adequate hearing Vision Screening - Comments:: Adequate vision:readers only. Eye  Dietary issues and exercise activities discussed:     Goals Addressed   None    Depression Screen    04/23/2022    3:42 PM 02/12/2022    8:33 AM 01/01/2022    7:59 AM 10/13/2021    8:31 AM 08/19/2021    9:36 AM 05/13/2021    8:36 AM 04/22/2021    4:00  PM  PHQ 2/9 Scores  PHQ - 2 Score 0 0 0 0 0 0 0  PHQ- 9 Score  3 0 0 0 0     Fall Risk    04/23/2022    3:40 PM 02/12/2022    8:33 AM 01/01/2022    7:59 AM 10/13/2021    8:31 AM 08/19/2021    9:36 AM  Fall Risk   Falls in the past year? 0 0 0 0 0  Number falls in past yr: 0 0 0 0 0  Injury with Fall? 0 0 0 0 0  Risk for fall due to : No Fall Risks No Fall Risks No Fall Risks No Fall Risks No Fall Risks  Follow up Education provided;Falls prevention discussed Falls prevention discussed Falls prevention discussed;Education provided Falls prevention discussed;Education provided Falls prevention discussed    FALL RISK PREVENTION PERTAINING TO THE HOME:  Any stairs in or around the home? Yes  If so, are there any without handrails? Yes  Home free of loose throw rugs in walkways, pet beds, electrical cords, etc? Yes  Adequate lighting in your home to reduce risk of falls? Yes   ASSISTIVE DEVICES UTILIZED TO PREVENT FALLS:  Life alert? No  Use of a cane, walker or w/c? No  Grab bars in the bathroom? No  Shower chair or bench in shower? No  Elevated toilet seat or a handicapped toilet? No   Cognitive Function:        04/23/2022    3:50 PM  6CIT Screen  What Year? 0 points  What month? 0 points  What time? 0 points  Count back from 20 0 points  Months in reverse 0 points  Repeat phrase 0 points  Total Score 0 points  Immunizations Immunization History  Administered Date(s) Administered   Fluad Quad(high Dose 65+) 11/28/2019, 01/02/2021, 12/15/2021   Influenza, Seasonal, Injecte, Preservative Fre 11/29/2012   Influenza,inj,Quad PF,6+ Mos 10/13/2013, 02/26/2017, 01/02/2019   Influenza-Unspecified 12/02/2014, 12/02/2015, 11/30/2017   PFIZER(Purple Top)SARS-COV-2 Vaccination 05/30/2019, 06/20/2019, 04/18/2020   Pneumococcal Conjugate-13 02/03/2016   Pneumococcal Polysaccharide-23 01/15/2015, 11/06/2019   Tdap 12/17/2008, 01/02/2019   Zoster Recombinat (Shingrix)  03/01/2019, 05/05/2019   Zoster, Live 05/02/2015    TDAP status: Up to date  Flu Vaccine status: Up to date  Pneumococcal vaccine status: Up to date  Covid-19 vaccine status: Completed vaccines  Qualifies for Shingles Vaccine? Yes   Zostavax completed yes Shingrix Completed?: Yes  Screening Tests Health Maintenance  Topic Date Due   COVID-19 Vaccine (4 - 2023-24 season) 11/07/2021   HEMOGLOBIN A1C  04/15/2022   OPHTHALMOLOGY EXAM  07/26/2022   Diabetic kidney evaluation - eGFR measurement  10/14/2022   Diabetic kidney evaluation - Urine ACR  10/14/2022   FOOT EXAM  02/13/2023   Medicare Annual Wellness (AWV)  04/24/2023   COLONOSCOPY (Pts 45-72yr Insurance coverage will need to be confirmed)  06/08/2023   MAMMOGRAM  03/10/2024   DTaP/Tdap/Td (3 - Td or Tdap) 01/01/2029   Pneumonia Vaccine 68 Years old  Completed   INFLUENZA VACCINE  Completed   DEXA SCAN  Completed   Hepatitis C Screening  Completed   Zoster Vaccines- Shingrix  Completed   HPV VACCINES  Aged Out    Health Maintenance  Health Maintenance Due  Topic Date Due   COVID-19 Vaccine (4 - 2023-24 season) 11/07/2021   HEMOGLOBIN A1C  04/15/2022    Colorectal cancer screening: Type of screening: Colonoscopy. Completed yes. Repeat every 10 years  Mammogram status: Completed yes. Repeat every year  Bone Density status: Completed 5. Results reflect: Bone density results: NORMAL. Repeat every 5 years.  Lung Cancer Screening: (Low Dose CT Chest recommended if Age 68-80years, 30 pack-year currently smoking OR have quit w/in 15years.) does not qualify.   Lung Cancer Screening Referral: no  Additional Screening:  Hepatitis C Screening: does not qualify; Completed yes  Vision Screening: Recommended annual ophthalmology exams for early detection of glaucoma and other disorders of the eye. Is the patient up to date with their annual eye exam?  Yes  Who is the provider or what is the name of the office in  which the patient attends annual eye exams? [facility] is not established with a provider, would they like to be referred to a provider to establish care? No .   Dental Screening: Recommended annual dental exams for proper oral hygiene  Community Resource Referral / Chronic Care Management: CRR required this visit?  No   CCM required this visit?  No      Plan:     I have personally reviewed and noted the following in the patient's chart:   Medical and social history Use of alcohol, tobacco or illicit drugs  Current medications and supplements including opioid prescriptions. Patient is not currently taking opioid prescriptions. Functional ability and status Nutritional status Physical activity Advanced directives List of other physicians Hospitalizations, surgeries, and ER visits in previous 12 months Vitals Screenings to include cognitive, depression, and falls Referrals and appointments  In addition, I have reviewed and discussed with patient certain preventive protocols, quality metrics, and best practice recommendations. A written personalized care plan for preventive services as well as general preventive health recommendations were provided to patient.  Roger Shelter, LPN   [id number]   Nurse Notes: pt is doing well and has no concerns or questions

## 2022-05-14 ENCOUNTER — Other Ambulatory Visit: Payer: Self-pay | Admitting: Family Medicine

## 2022-05-14 DIAGNOSIS — E1129 Type 2 diabetes mellitus with other diabetic kidney complication: Secondary | ICD-10-CM

## 2022-05-18 NOTE — Progress Notes (Unsigned)
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  .She denies blurred vision, polyphagia, polyuria or polydipsia.  She is on ARB for microalbuminuria  and lyrica for diabetic neuropathy, she is now taking 100 mg up to QID and is still having symptoms.,  continue statin therapy for dyslipidemia. Normal B12 level.Marland KitchenShe would like to see podiatrist    Obesity: weight is down from one year ago, March 2023 it was 217 lbs and now it is 209 lbs. She has not been physically active lately but will try to incorporate physical activity to her routine   HTN: she is on Benicar hctz  she is taking one pill daily, no chest pain, palpitation, SOB. She does not check her bp at home but has been controlled in our office.     Hyperlipidemia: she is now on Atorvastatin and denies side effects , last LDL was 48 , continue current regiment l   Perennial AR with seasonal variation: She is on singulair, xyzal , Flonase and Astelin prn only. She states even though is is early Spring , symptoms are controlled at this time    Portal Vein Thrombosis: saw hematologist but released from their care in 2022 she is still on Xarelto, she will be on that therapy for a life time, she denies any bleeding. She has  factor V leiden mutation . She was diagnosed with iron deficiency anemia of unknown cause, but HCT has been back to normal, no longer infusions but taking otc supplementation  Last Ferritin was 20. She is compliant with medications including iron supplements    Patient Active Problem List   Diagnosis Date Noted   Hypertension associated with  diabetes ([place]) 02/12/2022   Iron deficiency anemia 02/14/2018   Chondromalacia, right knee 10/30/2016   Long term current use of anticoagulant therapy 01/05/2016   Factor 5 Leiden mutation, heterozygous ([place]) 05/02/2015   Portal vein thrombosis 11/07/2014   History of sepsis 10/10/2014   Abnormal electrocardiogram 10/08/2014   Nonspecific abnormal electromyogram (EMG) 10/08/2014   Benign essential HTN 10/08/2014   Controlled type 2 diabetes with neuropathy ([place]) 10/08/2014   Dyslipidemia 10/08/2014   LBP (low back pain) 10/08/2014   Gastro-esophageal reflux disease without esophagitis 10/08/2014   Microalbuminuria 10/08/2014   Disturbance of skin sensation 10/08/2014   Obesity (BMI 30-39.9) 10/08/2014   Perennial allergic rhinitis with seasonal variation 10/08/2014   Plantar fasciitis 10/08/2014   Postablative ovarian failure 10/08/2014   Menopausal symptom 10/08/2014   Vitamin D deficiency 10/08/2014    Past Surgical History:  Procedure Laterality Date   ABDOMINAL HYSTERECTOMY  2010   APPENDECTOMY  1966   COLONOSCOPY  06-07-13   Dr Bary Castilla   DILATION AND CURETTAGE OF UTERUS     OOPHORECTOMY      Family History  Problem Relation Age of Onset   Diabetes Mother    COPD Mother    Diabetes Father    Hypertension Father    Cancer Father        Kidney  and Prostate   CAD Father    Diabetes Brother        Oldest Brother   Kidney disease Brother        Tumor removed   Breast cancer Neg Hx     Social History   Tobacco Use   Smoking status: Never   Smokeless tobacco: Never  Substance Use Topics   Alcohol use: No    Alcohol/week: 0.0 standard drinks of alcohol     Current Outpatient Medications:    atorvastatin (LIPITOR) 40 MG tablet, Take 1 tablet (40 mg total) by mouth daily., Disp: 90 tablet, Rfl: 1   azelastine (ASTELIN) 0.1 % nasal spray, Place 2 sprays into both nostrils 2 (two) times daily. Use in each nostril as directed, Disp: 30 mL, Rfl: 2   cholecalciferol  (VITAMIN D) 1000 units tablet, Take 500 Units by mouth daily. , Disp: , Rfl:    fluticasone (FLONASE) 50 MCG/ACT nasal spray, USE 2 SPRAY(S) IN EACH NOSTRIL AS NEEDED, Disp: 48 g, Rfl: 0   glucose blood (ONE TOUCH ULTRA TEST) test strip, USE AS DIRECTED, Disp: 100 each, Rfl: 12   Iron-FA-B Cmp-C-Biot-Probiotic (FUSION PLUS PO), Take 1 tablet by mouth daily. Vitamin with iron, Disp: , Rfl:    levocetirizine (XYZAL) 5 MG tablet, Take 1 tablet by mouth once daily, Disp: 90 tablet, Rfl: 1   MAGNESIUM OXIDE PO, Take 500 mg by mouth daily. , Disp: , Rfl:    metFORMIN (GLUCOPHAGE-XR) 750 MG 24 hr tablet, TAKE 2 TABLETS BY MOUTH ONCE DAILY WITH BREAKFAST, Disp: 180 tablet, Rfl: 0   montelukast (SINGULAIR) 10 MG tablet, Take 1 tablet (10 mg total) by mouth at bedtime., Disp: 90 tablet, Rfl: 3   olmesartan-hydrochlorothiazide (BENICAR HCT) 20-12.5 MG tablet, Take 1 tablet by mouth daily., Disp: 90 tablet, Rfl: 1   polyethylene glycol powder (GLYCOLAX/MIRALAX) powder, Take 17 g by mouth 2 (two) times daily., Disp: 3350 g, Rfl: 1   pregabalin (LYRICA) 100 MG capsule, Take 1-2 capsules (100-200 mg total) by mouth 3 (three) times daily., Disp: 360 capsule, Rfl: 0   rivaroxaban (XARELTO) 20 MG TABS tablet, Take 1 tablet (20 mg total) by mouth daily with supper., Disp: 90 tablet, Rfl: 1   Semaglutide (RYBELSUS) 14 MG TABS, Take 1 tablet (14 mg total) by mouth daily., Disp: 90 tablet, Rfl: 1   vitamin B-12 (CYANOCOBALAMIN) 500 MCG tablet, Take 1,000 mcg by mouth daily., Disp: , Rfl:    tiZANidine (ZANAFLEX) 2 MG tablet, Take 1 tablet (2 mg total) by mouth every 6 (six) hours as needed for muscle spasms. (Patient not taking: Reported on 05/19/2022), Disp: 30 tablet, Rfl: 0  No Known Allergies  I personally reviewed active problem list, medication list, allergies, family history, social history, health maintenance with the patient/caregiver today.   ROS  Constitutional: Negative for fever or weight change.   Respiratory: Negative for cough and shortness of breath.   Cardiovascular: Negative for chest pain or palpitations.  Gastrointestinal: Negative for abdominal pain, no bowel changes.  Musculoskeletal: Negative for gait problem intermittent right knee  joint swelling.  Skin: Negative for rash.  Neurological: Negative for dizziness or headache.  No other specific complaints in a complete review of systems (except as listed in HPI above).   Objective  Vitals:   05/19/22 1314  BP: 126/70  Pulse: 75  Resp: 14  Temp: 97.7 F (36.5 C)  TempSrc: Oral  SpO2: 95%  Weight: 209 lb 4.8 oz (94.9 kg)  Height: '5\' 9"'$  (1.753 m)    Body mass index is 30.91 kg/m.  Physical Exam  Constitutional: Patient appears well-developed and well-nourished. Obese  No distress.  HEENT: head atraumatic, normocephalic, pupils equal and reactive to light, neck supple, throat within normal limits Cardiovascular: Normal rate, regular rhythm and normal heart sounds.  No murmur heard. No BLE edema. Pulmonary/Chest: Effort normal and breath sounds normal. No respiratory distress. Abdominal: Soft.  There is no tenderness. Psychiatric: Patient has a normal mood and affect. behavior is normal. Judgment and thought content normal.   Recent Results (from the past 2160 hour(s))  POCT HgB A1C     Status: Normal   Collection Time: 05/19/22  1:16 PM  Result Value Ref Range   Hemoglobin A1C 5.5 4.0 - 5.6 %   HbA1c POC (<> result, manual entry)     HbA1c, POC (prediabetic range)     HbA1c, POC (controlled diabetic range)       PHQ2/9:    05/19/2022    1:16 PM 04/23/2022    3:42 PM 02/12/2022    8:33 AM 01/01/2022    7:59 AM 10/13/2021    8:31 AM  Depression screen PHQ 2/9  Decreased Interest 0 0 0 0 0  Down, Depressed, Hopeless 0 0 0 0 0  PHQ - 2 Score 0 0 0 0 0  Altered sleeping 0  3 0 0  Tired, decreased energy 0  0 0 0  Change in appetite 0  0 0 0  Feeling bad or failure about yourself  0  0 0 0  Trouble  concentrating 0  0 0 0  Moving slowly or fidgety/restless 0  0 0 0  Suicidal thoughts 0  0 0 0  PHQ-9 Score 0  3 0 0  Difficult doing work/chores    Not difficult at all Not difficult at all    phq 9 is negative   Fall Risk:    05/19/2022    1:15 PM 04/23/2022    3:40 PM 02/12/2022    8:33 AM 01/01/2022    7:59 AM 10/13/2021    8:31 AM  Fall Risk   Falls in the past year? 0 0 0 0 0  Number falls in past yr:  0 0 0 0  Injury with Fall?  0 0 0 0  Risk for fall due to : No Fall Risks No Fall Risks No Fall Risks No Fall Risks No Fall Risks  Follow up Falls prevention discussed Education provided;Falls prevention discussed Falls prevention discussed Falls prevention discussed;Education provided Falls prevention discussed;Education provided      Functional Status Survey: Is the patient deaf or have difficulty hearing?: No Does the patient have difficulty seeing, even when wearing glasses/contacts?: No Does the patient have difficulty concentrating, remembering, or making decisions?: No Does the patient have difficulty walking or climbing stairs?: No Does the patient have difficulty dressing or bathing?: No Does the patient have difficulty doing errands alone such as visiting a doctor's office or shopping?: No    Assessment & Plan  1. Type 2 diabetes mellitus with microalbuminuria, without long-term current use of insulin (HCC)  - POCT HgB A1C - metFORMIN (GLUCOPHAGE-XR) 750 MG 24 hr tablet; Take 1 tablet (750 mg total) by mouth daily with breakfast.  Dispense: 90 tablet; Refill: 0  2. Portal vein thrombosis  Tolerating Xarelto well and no symptoms   3. Factor 5 Leiden mutation, heterozygous ([place])  On Xarelto   4. Hypertension associated with diabetes (  HCC)  BP is at goal   5. Vitamin D deficiency  Continue supplementation   6. Low serum vitamin B12  Continue supplementation   7. History of iron deficiency   8. Benign essential HTN  -  olmesartan-hydrochlorothiazide (BENICAR HCT) 20-12.5 MG tablet; Take 1 tablet by mouth daily.  Dispense: 90 tablet; Refill: 1  9. Perennial allergic rhinitis with seasonal variation   10. Chronic pain of right knee   11. Controlled type 2 diabetes with neuropathy ([place])  - Ambulatory referral to Neurology - pregabalin (LYRICA) 100 MG capsule; Take 1-2 capsules (100-200 mg total) by mouth 3 (three) times daily.  Dispense: 360 capsule; Refill: 0

## 2022-05-19 ENCOUNTER — Ambulatory Visit: Payer: Medicare PPO | Admitting: Family Medicine

## 2022-05-19 ENCOUNTER — Encounter: Payer: Self-pay | Admitting: Family Medicine

## 2022-05-19 VITALS — BP 126/70 | HR 75 | Temp 97.7°F | Resp 14 | Ht 69.0 in | Wt 209.3 lb

## 2022-05-19 DIAGNOSIS — Z8639 Personal history of other endocrine, nutritional and metabolic disease: Secondary | ICD-10-CM | POA: Diagnosis not present

## 2022-05-19 DIAGNOSIS — E559 Vitamin D deficiency, unspecified: Secondary | ICD-10-CM | POA: Diagnosis not present

## 2022-05-19 DIAGNOSIS — E1140 Type 2 diabetes mellitus with diabetic neuropathy, unspecified: Secondary | ICD-10-CM

## 2022-05-19 DIAGNOSIS — I152 Hypertension secondary to endocrine disorders: Secondary | ICD-10-CM

## 2022-05-19 DIAGNOSIS — R809 Proteinuria, unspecified: Secondary | ICD-10-CM | POA: Diagnosis not present

## 2022-05-19 DIAGNOSIS — I10 Essential (primary) hypertension: Secondary | ICD-10-CM

## 2022-05-19 DIAGNOSIS — E1159 Type 2 diabetes mellitus with other circulatory complications: Secondary | ICD-10-CM

## 2022-05-19 DIAGNOSIS — M25561 Pain in right knee: Secondary | ICD-10-CM

## 2022-05-19 DIAGNOSIS — J302 Other seasonal allergic rhinitis: Secondary | ICD-10-CM

## 2022-05-19 DIAGNOSIS — I81 Portal vein thrombosis: Secondary | ICD-10-CM | POA: Diagnosis not present

## 2022-05-19 DIAGNOSIS — J3089 Other allergic rhinitis: Secondary | ICD-10-CM

## 2022-05-19 DIAGNOSIS — E1129 Type 2 diabetes mellitus with other diabetic kidney complication: Secondary | ICD-10-CM | POA: Diagnosis not present

## 2022-05-19 DIAGNOSIS — G8929 Other chronic pain: Secondary | ICD-10-CM

## 2022-05-19 DIAGNOSIS — D6851 Activated protein C resistance: Secondary | ICD-10-CM | POA: Diagnosis not present

## 2022-05-19 DIAGNOSIS — E538 Deficiency of other specified B group vitamins: Secondary | ICD-10-CM | POA: Diagnosis not present

## 2022-05-19 LAB — POCT GLYCOSYLATED HEMOGLOBIN (HGB A1C): Hemoglobin A1C: 5.5 % (ref 4.0–5.6)

## 2022-05-19 MED ORDER — OLMESARTAN MEDOXOMIL-HCTZ 20-12.5 MG PO TABS: 1.0000 | ORAL_TABLET | Freq: Every day | ORAL | 1 refills | Status: DC

## 2022-05-19 MED ORDER — PREGABALIN 100 MG PO CAPS: 100.0000 mg | ORAL_CAPSULE | Freq: Three times a day (TID) | ORAL | 0 refills | Status: DC

## 2022-05-19 MED ORDER — METFORMIN HCL ER 750 MG PO TB24: 750.0000 mg | ORAL_TABLET | Freq: Every day | ORAL | 0 refills | Status: DC

## 2022-06-18 ENCOUNTER — Telehealth: Payer: Self-pay | Admitting: Family Medicine

## 2022-06-18 NOTE — Telephone Encounter (Signed)
Patient states that pharmacy states that they need clarify on sig, they state it should [id number] 1 capsules (100-200 mg total) by mouth 3 (three) times daily.  Please advise.

## 2022-06-18 NOTE — Telephone Encounter (Signed)
Medication Refill - Medication: pregabalin (LYRICA) 100 MG capsule [[id number]]   Pt is calling to report that she is in need of a refill for this medication as she only has one pill left.   Pt reports that the pharmacy is needed the sig to state   Has the patient contacted their pharmacy? Yes.   Take 1 capsules (100-200 mg total) by mouth 3 (three) times daily.    (Agent: If no, request that the patient contact the pharmacy for the refill. If patient does not wish to contact the pharmacy document the reason why and proceed with request.) (Agent: If yes, when and what did the pharmacy advise?)  Preferred Pharmacy (with phone number or street name):  [facility] [address] [place] - [address] [place] [address] Phone: [phone]  Fax: [phone]   Has the patient been seen for an appointment in the last year OR does the patient have an upcoming appointment? Yes.    Agent: Please be advised that RX refills may take up to 3 business days. We ask that you follow-up with your pharmacy.

## 2022-06-19 ENCOUNTER — Other Ambulatory Visit: Payer: Self-pay

## 2022-06-19 DIAGNOSIS — E1140 Type 2 diabetes mellitus with diabetic neuropathy, unspecified: Secondary | ICD-10-CM

## 2022-06-19 MED ORDER — PREGABALIN 100 MG PO CAPS: 100.0000 mg | ORAL_CAPSULE | Freq: Three times a day (TID) | ORAL | 0 refills | Status: DC

## 2022-06-19 MED ORDER — PREGABALIN 100 MG PO CAPS: 100.0000 mg | ORAL_CAPSULE | Freq: Three times a day (TID) | ORAL | 1 refills | Status: DC

## 2022-06-19 NOTE — Telephone Encounter (Signed)
Sent to pharmacy in March with that exact sig. Refill request too soon.

## 2022-07-23 DIAGNOSIS — H02054 Trichiasis without entropian left upper eyelid: Secondary | ICD-10-CM | POA: Diagnosis not present

## 2022-07-27 ENCOUNTER — Telehealth: Payer: Self-pay | Admitting: Family Medicine

## 2022-07-27 NOTE — Telephone Encounter (Signed)
Copied from CRM #[id number]. Topic: Referral - Status >> Jul 27, 2022  3:40 PM Ja-Kwan M wrote: Reason for CRM: Pt stated that she has an appt with  [facility] but she is being told that they need a referral to be sent over. 

## 2022-07-27 NOTE — Telephone Encounter (Signed)
Copied from CRM [id number]. Topic: Referral - Status >> Jul 27, 2022  3:40 PM Ja-Kwan M wrote: Reason for CRM: Pt stated that she has an appt with [facility] but she is being told that they need a referral to be sent over.

## 2022-07-28 ENCOUNTER — Other Ambulatory Visit: Payer: Self-pay

## 2022-07-28 DIAGNOSIS — H9190 Unspecified hearing loss, unspecified ear: Secondary | ICD-10-CM

## 2022-07-28 NOTE — Telephone Encounter (Signed)
Referral tee'd up and sent

## 2022-07-28 NOTE — Telephone Encounter (Signed)
Called and lvm to clarify what it is she needs

## 2022-07-29 DIAGNOSIS — H43813 Vitreous degeneration, bilateral: Secondary | ICD-10-CM | POA: Diagnosis not present

## 2022-07-29 DIAGNOSIS — E119 Type 2 diabetes mellitus without complications: Secondary | ICD-10-CM | POA: Diagnosis not present

## 2022-07-29 DIAGNOSIS — H2513 Age-related nuclear cataract, bilateral: Secondary | ICD-10-CM | POA: Diagnosis not present

## 2022-07-29 LAB — HM DIABETES EYE EXAM

## 2022-08-05 DIAGNOSIS — H903 Sensorineural hearing loss, bilateral: Secondary | ICD-10-CM | POA: Diagnosis not present

## 2022-08-06 DIAGNOSIS — E1129 Type 2 diabetes mellitus with other diabetic kidney complication: Secondary | ICD-10-CM | POA: Diagnosis not present

## 2022-08-06 DIAGNOSIS — E1142 Type 2 diabetes mellitus with diabetic polyneuropathy: Secondary | ICD-10-CM | POA: Diagnosis not present

## 2022-08-06 DIAGNOSIS — R809 Proteinuria, unspecified: Secondary | ICD-10-CM | POA: Diagnosis not present

## 2022-08-06 DIAGNOSIS — Z114 Encounter for screening for human immunodeficiency virus [HIV]: Secondary | ICD-10-CM | POA: Diagnosis not present

## 2022-08-07 ENCOUNTER — Other Ambulatory Visit: Payer: Self-pay | Admitting: Family Medicine

## 2022-08-07 DIAGNOSIS — J302 Other seasonal allergic rhinitis: Secondary | ICD-10-CM

## 2022-08-15 ENCOUNTER — Encounter: Payer: Self-pay | Admitting: Family Medicine

## 2022-08-17 ENCOUNTER — Other Ambulatory Visit: Payer: Self-pay | Admitting: Family Medicine

## 2022-08-17 DIAGNOSIS — R7989 Other specified abnormal findings of blood chemistry: Secondary | ICD-10-CM

## 2022-08-26 DIAGNOSIS — R0683 Snoring: Secondary | ICD-10-CM | POA: Diagnosis not present

## 2022-08-26 DIAGNOSIS — R29810 Facial weakness: Secondary | ICD-10-CM | POA: Diagnosis not present

## 2022-08-26 DIAGNOSIS — G6289 Other specified polyneuropathies: Secondary | ICD-10-CM | POA: Diagnosis not present

## 2022-08-26 DIAGNOSIS — R200 Anesthesia of skin: Secondary | ICD-10-CM | POA: Diagnosis not present

## 2022-08-27 ENCOUNTER — Other Ambulatory Visit: Payer: Self-pay | Admitting: Neurology

## 2022-08-27 DIAGNOSIS — R29810 Facial weakness: Secondary | ICD-10-CM

## 2022-09-01 DIAGNOSIS — R200 Anesthesia of skin: Secondary | ICD-10-CM | POA: Diagnosis not present

## 2022-09-08 ENCOUNTER — Other Ambulatory Visit: Payer: Self-pay | Admitting: Family Medicine

## 2022-09-08 DIAGNOSIS — E1140 Type 2 diabetes mellitus with diabetic neuropathy, unspecified: Secondary | ICD-10-CM

## 2022-09-14 ENCOUNTER — Other Ambulatory Visit: Payer: Medicare PPO

## 2022-09-23 ENCOUNTER — Encounter: Payer: Medicare PPO | Admitting: Dermatology

## 2022-10-06 ENCOUNTER — Other Ambulatory Visit: Payer: Self-pay | Admitting: Family Medicine

## 2022-10-06 DIAGNOSIS — I81 Portal vein thrombosis: Secondary | ICD-10-CM

## 2022-10-06 DIAGNOSIS — D6851 Activated protein C resistance: Secondary | ICD-10-CM

## 2022-10-07 ENCOUNTER — Ambulatory Visit
Admission: RE | Admit: 2022-10-07 | Discharge: 2022-10-07 | Disposition: A | Discharge status: Home / Self Care | Payer: Medicare PPO | Source: Ambulatory Visit | Attending: Neurology | Admitting: Neurology

## 2022-10-07 DIAGNOSIS — R29810 Facial weakness: Secondary | ICD-10-CM

## 2022-10-09 ENCOUNTER — Other Ambulatory Visit: Payer: Self-pay | Admitting: Family Medicine

## 2022-10-09 DIAGNOSIS — E1140 Type 2 diabetes mellitus with diabetic neuropathy, unspecified: Secondary | ICD-10-CM

## 2022-10-12 ENCOUNTER — Other Ambulatory Visit: Payer: Self-pay | Admitting: Family Medicine

## 2022-10-12 DIAGNOSIS — E1140 Type 2 diabetes mellitus with diabetic neuropathy, unspecified: Secondary | ICD-10-CM

## 2022-10-12 MED ORDER — PREGABALIN 200 MG PO CAPS: 200.0000 mg | ORAL_CAPSULE | Freq: Every day | ORAL | 1 refills | Status: DC

## 2022-10-12 MED ORDER — PREGABALIN 100 MG PO CAPS: 100.0000 mg | ORAL_CAPSULE | Freq: Three times a day (TID) | ORAL | 1 refills | Status: DC

## 2022-10-16 ENCOUNTER — Other Ambulatory Visit: Payer: Self-pay | Admitting: Family Medicine

## 2022-10-16 DIAGNOSIS — E785 Hyperlipidemia, unspecified: Secondary | ICD-10-CM

## 2022-10-21 ENCOUNTER — Ambulatory Visit: Payer: Medicare PPO | Admitting: Family Medicine

## 2022-10-21 ENCOUNTER — Encounter: Payer: Self-pay | Admitting: Family Medicine

## 2022-10-21 VITALS — BP 124/82 | HR 94 | Resp 16 | Ht 69.0 in | Wt 212.0 lb

## 2022-10-21 DIAGNOSIS — E1129 Type 2 diabetes mellitus with other diabetic kidney complication: Secondary | ICD-10-CM | POA: Diagnosis not present

## 2022-10-21 DIAGNOSIS — E785 Hyperlipidemia, unspecified: Secondary | ICD-10-CM

## 2022-10-21 DIAGNOSIS — K219 Gastro-esophageal reflux disease without esophagitis: Secondary | ICD-10-CM

## 2022-10-21 DIAGNOSIS — E538 Deficiency of other specified B group vitamins: Secondary | ICD-10-CM

## 2022-10-21 DIAGNOSIS — E1140 Type 2 diabetes mellitus with diabetic neuropathy, unspecified: Secondary | ICD-10-CM | POA: Diagnosis not present

## 2022-10-21 DIAGNOSIS — D6851 Activated protein C resistance: Secondary | ICD-10-CM

## 2022-10-21 DIAGNOSIS — R809 Proteinuria, unspecified: Secondary | ICD-10-CM | POA: Diagnosis not present

## 2022-10-21 DIAGNOSIS — R7989 Other specified abnormal findings of blood chemistry: Secondary | ICD-10-CM

## 2022-10-21 DIAGNOSIS — I81 Portal vein thrombosis: Secondary | ICD-10-CM

## 2022-10-21 DIAGNOSIS — I10 Essential (primary) hypertension: Secondary | ICD-10-CM

## 2022-10-21 DIAGNOSIS — J302 Other seasonal allergic rhinitis: Secondary | ICD-10-CM

## 2022-10-21 DIAGNOSIS — E559 Vitamin D deficiency, unspecified: Secondary | ICD-10-CM

## 2022-10-21 DIAGNOSIS — Z8639 Personal history of other endocrine, nutritional and metabolic disease: Secondary | ICD-10-CM | POA: Diagnosis not present

## 2022-10-21 DIAGNOSIS — Z7984 Long term (current) use of oral hypoglycemic drugs: Secondary | ICD-10-CM

## 2022-10-21 LAB — POCT GLYCOSYLATED HEMOGLOBIN (HGB A1C): Hemoglobin A1C: 5.5 % (ref 4.0–5.6)

## 2022-10-21 MED ORDER — LEVOCETIRIZINE DIHYDROCHLORIDE 5 MG PO TABS
5.0000 mg | ORAL_TABLET | Freq: Every day | ORAL | 1 refills | Status: DC
Start: 2022-10-21 — End: 2023-04-26

## 2022-10-21 MED ORDER — RIVAROXABAN 20 MG PO TABS
20.0000 mg | ORAL_TABLET | Freq: Every day | ORAL | 1 refills | Status: DC
Start: 2022-10-21 — End: 2023-06-28

## 2022-10-21 MED ORDER — OLMESARTAN MEDOXOMIL-HCTZ 20-12.5 MG PO TABS
1.0000 | ORAL_TABLET | Freq: Every day | ORAL | 1 refills | Status: DC
Start: 2022-10-21 — End: 2023-06-29

## 2022-10-21 MED ORDER — RYBELSUS 7 MG PO TABS: 7.0000 mg | ORAL_TABLET | Freq: Every day | ORAL | 1 refills | Status: DC

## 2022-10-21 MED ORDER — METFORMIN HCL ER 750 MG PO TB24
750.0000 mg | ORAL_TABLET | Freq: Every day | ORAL | 1 refills | Status: DC
Start: 2022-10-21 — End: 2023-07-01

## 2022-10-21 NOTE — Progress Notes (Signed)
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 She wants to go down on dose of Rybelsus  and continue Metformin 750 mg daily   .She denies blurred vision, polyphagia, polyuria or polydipsia.  She is on ARB for microalbuminuria  and lyrica for diabetic neuropathy, we will recheck labs today    Obesity: weight is down from one year ago, March 2023 it was 217 lbs and now it is 209 lbs, but today is up to 213 lbs, she skipped Rybelsus for a period of time but states does not seem to work for weight loss anymore, she wants to go down to 7 mg dose today .    HTN: she is on Benicar hctz  she is taking one pill daily, no chest pain, palpitation, SOB. BP was low last week when she felt dizzy and she held medication for one day but back taking it and bp is at goal     Hyperlipidemia: she is now on Atorvastatin and denies side effects , last LDL was 48 , continue current regiment . We will recheck labs today   Perennial AR with seasonal variation: She is on singulair, xyzal , Flonase and Astelin prn only. She states even though is is early Spring ,occasionally in the Fall but doing well now   Low TSH: we will recheck labs, previous level done by Dr. Sherryll Burger    Portal Vein Thrombosis: saw hematologist but released from their care in 2022 she is still on Xarelto, she will be on that therapy for a life time, she denies any bleeding. She has  factor V leiden mutation . She was diagnosed with iron deficiency anemia of unknown cause, but HCT has been back to normal, no longer having iron infusions but taking otc supplementation  Last Ferritin was 20 and we will recheck it today  She is  compliant with medications including iron supplements   Lower abdominal pain: she states had symptoms of lower abdominal pain, nausea, lack of appetite and change in bowel movements without blood in stools for about 3 weeks , from July 5th on. She has a history of diverticulosis, may have been diverticulitis, advised to come in if it happens again.    Patient Active Problem List   Diagnosis Date Noted   Low serum vitamin B12 05/19/2022   Type 2 diabetes mellitus with microalbuminuria, without long-term current use of insulin (HCC) 05/19/2022   Hypertension associated with diabetes (HCC) 02/12/2022   Iron deficiency anemia 02/14/2018   Chondromalacia, right knee 10/30/2016   Long term current use of anticoagulant therapy 01/05/2016   Factor 5 Leiden mutation, heterozygous (HCC) 05/02/2015   Portal vein thrombosis 11/07/2014   History of sepsis 10/10/2014   Abnormal electrocardiogram 10/08/2014   Nonspecific abnormal electromyogram (EMG) 10/08/2014   Benign essential HTN 10/08/2014   Controlled type 2 diabetes with neuropathy (HCC) 10/08/2014   Dyslipidemia 10/08/2014   LBP (low back pain) 10/08/2014   Gastro-esophageal reflux disease without esophagitis 10/08/2014   Microalbuminuria 10/08/2014   Disturbance of skin sensation 10/08/2014   Obesity (BMI 30-39.9) 10/08/2014  Perennial allergic rhinitis with seasonal variation 10/08/2014   Plantar fasciitis 10/08/2014   Postablative ovarian failure 10/08/2014   Menopausal symptom 10/08/2014   Vitamin D deficiency 10/08/2014    Past Surgical History:  Procedure Laterality Date   ABDOMINAL HYSTERECTOMY  2010   APPENDECTOMY  1966   COLONOSCOPY  06-07-13   Dr Lemar Livings   DILATION AND CURETTAGE OF UTERUS     OOPHORECTOMY      Family History  Problem Relation Age of Onset   Diabetes Mother    COPD Mother    Diabetes Father    Hypertension Father    Cancer Father        Kidney and Prostate   CAD Father    Diabetes Brother         Oldest Brother   Kidney disease Brother        Tumor removed   Breast cancer Neg Hx     Social History   Tobacco Use   Smoking status: Never   Smokeless tobacco: Never  Substance Use Topics   Alcohol use: No    Alcohol/week: 0.0 standard drinks of alcohol     Current Outpatient Medications:    atorvastatin (LIPITOR) 40 MG tablet, Take 1 tablet by mouth once daily, Disp: 90 tablet, Rfl: 0   azelastine (ASTELIN) 0.1 % nasal spray, Place 2 sprays into both nostrils 2 (two) times daily. Use in each nostril as directed, Disp: 30 mL, Rfl: 2   cholecalciferol (VITAMIN D) 1000 units tablet, Take 500 Units by mouth daily. , Disp: , Rfl:    fluticasone (FLONASE) 50 MCG/ACT nasal spray, USE 2 SPRAY(S) IN EACH NOSTRIL AS NEEDED, Disp: 48 g, Rfl: 0   glucose blood (ONE TOUCH ULTRA TEST) test strip, USE AS DIRECTED, Disp: 100 each, Rfl: 12   Iron-FA-B Cmp-C-Biot-Probiotic (FUSION PLUS PO), Take 1 tablet by mouth daily. Vitamin with iron, Disp: , Rfl:    MAGNESIUM OXIDE PO, Take 500 mg by mouth daily. , Disp: , Rfl:    montelukast (SINGULAIR) 10 MG tablet, Take 1 tablet (10 mg total) by mouth at bedtime., Disp: 90 tablet, Rfl: 3   polyethylene glycol powder (GLYCOLAX/MIRALAX) powder, Take 17 g by mouth 2 (two) times daily., Disp: 3350 g, Rfl: 1   pregabalin (LYRICA) 100 MG capsule, Take 1 capsule (100 mg total) by mouth 3 (three) times daily., Disp: 270 capsule, Rfl: 1   pregabalin (LYRICA) 200 MG capsule, Take 1 capsule (200 mg total) by mouth at bedtime., Disp: 90 capsule, Rfl: 1   Semaglutide (RYBELSUS) 7 MG TABS, Take 1 tablet (7 mg total) by mouth daily., Disp: 90 tablet, Rfl: 1   vitamin B-12 (CYANOCOBALAMIN) 500 MCG tablet, Take 1,000 mcg by mouth daily., Disp: , Rfl:    levocetirizine (XYZAL) 5 MG tablet, Take 1 tablet (5 mg total) by mouth daily., Disp: 90 tablet, Rfl: 1   metFORMIN (GLUCOPHAGE-XR) 750 MG 24 hr tablet, Take 1 tablet (750 mg total) by mouth daily with breakfast., Disp: 90  tablet, Rfl: 1   olmesartan-hydrochlorothiazide (BENICAR HCT) 20-12.5 MG tablet, Take 1 tablet by mouth daily., Disp: 90 tablet, Rfl: 1   rivaroxaban (XARELTO) 20 MG TABS tablet, Take 1 tablet (20 mg total) by mouth daily with supper., Disp: 90 tablet, Rfl: 1  No Known Allergies  I personally reviewed active problem list, medication list, allergies, family history, social history, health maintenance with the patient/caregiver today.   ROS  Constitutional: Negative for fever or weight change.  Respiratory:  Negative for cough and shortness of breath.   Cardiovascular: Negative for chest pain or palpitations.  Gastrointestinal: Negative for abdominal pain, no bowel changes.  Musculoskeletal: Negative for gait problem or joint swelling.  Skin: Negative for rash.  Neurological: Negative for dizziness or headache.  No other specific complaints in a complete review of systems (except as listed in HPI above).   Objective  Vitals:   10/21/22 0759  BP: 124/82  Pulse: 94  Resp: 16  SpO2: 97%  Weight: 212 lb (96.2 kg)  Height: 5\' 9"  (1.753 m)    Body mass index is 31.31 kg/m.  Physical Exam  Constitutional: Patient appears well-developed and well-nourished. Obese  No distress.  HEENT: head atraumatic, normocephalic, pupils equal and reactive to light, neck supple Cardiovascular: Normal rate, regular rhythm and normal heart sounds.  No murmur heard. No BLE edema. Pulmonary/Chest: Effort normal and breath sounds normal. No respiratory distress. Abdominal: Soft.  There is no tenderness. Psychiatric: Patient has a normal mood and affect. behavior is normal. Judgment and thought content normal.   Recent Results (from the past 2160 hour(s))  HM DIABETES EYE EXAM     Status: None   Collection Time: 07/29/22 12:00 AM  Result Value Ref Range   HM Diabetic Eye Exam No Retinopathy No Retinopathy  POCT HgB A1C     Status: None   Collection Time: 10/21/22  8:06 AM  Result Value Ref Range    Hemoglobin A1C 5.5 4.0 - 5.6 %   HbA1c POC (<> result, manual entry)     HbA1c, POC (prediabetic range)     HbA1c, POC (controlled diabetic range)      PHQ2/9:    10/21/2022    8:00 AM 05/19/2022    1:16 PM 04/23/2022    3:42 PM 02/12/2022    8:33 AM 01/01/2022    7:59 AM  Depression screen PHQ 2/9  Decreased Interest 0 0 0 0 0  Down, Depressed, Hopeless 0 0 0 0 0  PHQ - 2 Score 0 0 0 0 0  Altered sleeping 0 0  3 0  Tired, decreased energy 0 0  0 0  Change in appetite 0 0  0 0  Feeling bad or failure about yourself  0 0  0 0  Trouble concentrating 0 0  0 0  Moving slowly or fidgety/restless 0 0  0 0  Suicidal thoughts 0 0  0 0  PHQ-9 Score 0 0  3 0  Difficult doing work/chores     Not difficult at all    phq 9 is negative   Fall Risk:    10/21/2022    7:59 AM 05/19/2022    1:15 PM 04/23/2022    3:40 PM 02/12/2022    8:33 AM 01/01/2022    7:59 AM  Fall Risk   Falls in the past year? 0 0 0 0 0  Number falls in past yr: 0  0 0 0  Injury with Fall? 0  0 0 0  Risk for fall due to : No Fall Risks No Fall Risks No Fall Risks No Fall Risks No Fall Risks  Follow up Falls prevention discussed Falls prevention discussed Education provided;Falls prevention discussed Falls prevention discussed Falls prevention discussed;Education provided      Functional Status Survey: Is the patient deaf or have difficulty hearing?: No Does the patient have difficulty seeing, even when wearing glasses/contacts?: No Does the patient have difficulty concentrating, remembering, or making decisions?: No Does the patient  have difficulty walking or climbing stairs?: No Does the patient have difficulty dressing or bathing?: No Does the patient have difficulty doing errands alone such as visiting a doctor's office or shopping?: No    Assessment & Plan  1. Controlled type 2 diabetes with neuropathy (HCC)  - POCT HgB A1C - COMPLETE METABOLIC PANEL WITH GFR - Urine Microalbumin w/creat. ratio -  Semaglutide (RYBELSUS) 7 MG TABS; Take 1 tablet (7 mg total) by mouth daily.  Dispense: 90 tablet; Refill: 1  2. Low TSH level  - TSH + free T4 - Thyroglobulin Level  3. Factor 5 Leiden mutation, heterozygous (HCC)  - rivaroxaban (XARELTO) 20 MG TABS tablet; Take 1 tablet (20 mg total) by mouth daily with supper.  Dispense: 90 tablet; Refill: 1  4. Type 2 diabetes mellitus with microalbuminuria, without long-term current use of insulin (HCC)  - Urine Microalbumin w/creat. ratio - metFORMIN (GLUCOPHAGE-XR) 750 MG 24 hr tablet; Take 1 tablet (750 mg total) by mouth daily with breakfast.  Dispense: 90 tablet; Refill: 1 - Semaglutide (RYBELSUS) 7 MG TABS; Take 1 tablet (7 mg total) by mouth daily.  Dispense: 90 tablet; Refill: 1  5. Portal vein thrombosis  - rivaroxaban (XARELTO) 20 MG TABS tablet; Take 1 tablet (20 mg total) by mouth daily with supper.  Dispense: 90 tablet; Refill: 1  6. Vitamin D deficiency  - VITAMIN D 25 Hydroxy (Vit-D Deficiency, Fractures)  7. Low serum vitamin B12  - CBC with Differential/Platelet  8. History of iron deficiency  - CBC with Differential/Platelet - Iron, TIBC and Ferritin Panel  9. Gastro-esophageal reflux disease without esophagitis  Stable   10. Dyslipidemia  - Lipid panel  11. Benign essential HTN  - olmesartan-hydrochlorothiazide (BENICAR HCT) 20-12.5 MG tablet; Take 1 tablet by mouth daily.  Dispense: 90 tablet; Refill: 1  12. Perennial allergic rhinitis with seasonal variation  - levocetirizine (XYZAL) 5 MG tablet; Take 1 tablet (5 mg total) by mouth daily.  Dispense: 90 tablet; Refill: 1

## 2022-10-23 LAB — CBC WITH DIFFERENTIAL/PLATELET
Absolute Monocytes: 607 {cells}/uL (ref 200–950)
Basophils Absolute: 33 {cells}/uL (ref 0–200)
Basophils Relative: 0.5 %
Eosinophils Absolute: 99 {cells}/uL (ref 15–500)
Eosinophils Relative: 1.5 %
HCT: 36.6 % (ref 35.0–45.0)
Hemoglobin: 12.5 g/dL (ref 11.7–15.5)
Lymphs Abs: 2092 {cells}/uL (ref 850–3900)
MCH: 28.7 pg (ref 27.0–33.0)
MCHC: 34.2 g/dL (ref 32.0–36.0)
MCV: 84.1 fL (ref 80.0–100.0)
MPV: 11.1 fL (ref 7.5–12.5)
Monocytes Relative: 9.2 %
Neutro Abs: 3769 {cells}/uL (ref 1500–7800)
Neutrophils Relative %: 57.1 %
Platelets: 182 10*3/uL (ref 140–400)
RBC: 4.35 10*6/uL (ref 3.80–5.10)
RDW: 13.7 % (ref 11.0–15.0)
Total Lymphocyte: 31.7 %
WBC: 6.6 10*3/uL (ref 3.8–10.8)

## 2022-10-23 LAB — COMPLETE METABOLIC PANEL WITH GFR
AG Ratio: 1.5 (calc) (ref 1.0–2.5)
ALT: 21 U/L (ref 6–29)
AST: 15 U/L (ref 10–35)
Albumin: 4.1 g/dL (ref 3.6–5.1)
Alkaline phosphatase (APISO): 81 U/L (ref 37–153)
BUN: 12 mg/dL (ref 7–25)
CO2: 31 mmol/L (ref 20–32)
Calcium: 9.7 mg/dL (ref 8.6–10.4)
Chloride: 102 mmol/L (ref 98–110)
Creat: 0.69 mg/dL (ref 0.50–1.05)
Globulin: 2.7 g/dL (ref 1.9–3.7)
Glucose, Bld: 88 mg/dL (ref 65–99)
Potassium: 4.3 mmol/L (ref 3.5–5.3)
Sodium: 139 mmol/L (ref 135–146)
Total Bilirubin: 0.5 mg/dL (ref 0.2–1.2)
Total Protein: 6.8 g/dL (ref 6.1–8.1)
eGFR: 94 mL/min/{1.73_m2} (ref >=60)

## 2022-10-23 LAB — MICROALBUMIN / CREATININE URINE RATIO
Creatinine, Urine: 39 mg/dL (ref 20–275)
Microalb, Ur: <0.2 mg/dL

## 2022-10-23 LAB — THYROGLOBULIN LEVEL: Thyroglobulin: 82.8 ng/mL — ABNORMAL HIGH

## 2022-10-23 LAB — LIPID PANEL
Cholesterol: 106 mg/dL (ref <200)
HDL: 47 mg/dL — ABNORMAL LOW (ref >=50)
LDL Cholesterol (Calc): 38 mg/dL
Non-HDL Cholesterol (Calc): 59 mg/dL (ref <130)
Total CHOL/HDL Ratio: 2.3 (calc) (ref <5.0)
Triglycerides: 119 mg/dL (ref <150)

## 2022-10-23 LAB — IRON,TIBC AND FERRITIN PANEL
%SAT: 16 % (ref 16–45)
Ferritin: 23 ng/mL (ref 16–288)
Iron: 52 ug/dL (ref 45–160)
TIBC: 331 ug/dL (ref 250–450)

## 2022-10-23 LAB — VITAMIN D 25 HYDROXY (VIT D DEFICIENCY, FRACTURES): Vit D, 25-Hydroxy: 56 ng/mL (ref 30–100)

## 2022-10-23 LAB — T4, FREE: Free T4: 1.3 ng/dL (ref 0.8–1.8)

## 2022-10-23 LAB — TSH+FREE T4: TSH W/REFLEX TO FT4: 0.03 m[IU]/L — ABNORMAL LOW (ref 0.40–4.50)

## 2022-10-24 DIAGNOSIS — T25221A Burn of second degree of right foot, initial encounter: Secondary | ICD-10-CM | POA: Diagnosis not present

## 2022-10-24 DIAGNOSIS — Z23 Encounter for immunization: Secondary | ICD-10-CM | POA: Diagnosis not present

## 2022-10-24 DIAGNOSIS — E119 Type 2 diabetes mellitus without complications: Secondary | ICD-10-CM | POA: Diagnosis not present

## 2022-10-24 DIAGNOSIS — I10 Essential (primary) hypertension: Secondary | ICD-10-CM | POA: Diagnosis not present

## 2022-10-24 DIAGNOSIS — T25222A Burn of second degree of left foot, initial encounter: Secondary | ICD-10-CM | POA: Diagnosis not present

## 2022-10-25 ENCOUNTER — Encounter: Payer: Self-pay | Admitting: Family Medicine

## 2022-10-26 ENCOUNTER — Ambulatory Visit: Payer: Medicare PPO | Admitting: Nurse Practitioner

## 2022-10-26 ENCOUNTER — Other Ambulatory Visit: Payer: Self-pay

## 2022-10-26 ENCOUNTER — Encounter: Payer: Self-pay | Admitting: Nurse Practitioner

## 2022-10-26 ENCOUNTER — Encounter: Payer: Self-pay | Admitting: Family Medicine

## 2022-10-26 VITALS — BP 132/74 | HR 99 | Temp 98.8°F | Resp 16 | Ht 69.0 in | Wt 212.0 lb

## 2022-10-26 DIAGNOSIS — T25222A Burn of second degree of left foot, initial encounter: Secondary | ICD-10-CM

## 2022-10-26 DIAGNOSIS — T25121A Burn of first degree of right foot, initial encounter: Secondary | ICD-10-CM

## 2022-10-26 DIAGNOSIS — T300 Burn of unspecified body region, unspecified degree: Secondary | ICD-10-CM
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
## 2022-10-27 ENCOUNTER — Ambulatory Visit
Admission: RE | Admit: 2022-10-27 | Discharge: 2022-10-27 | Disposition: A | Discharge status: Home / Self Care | Payer: Medicare PPO | Source: Ambulatory Visit | Attending: Neurology | Admitting: Neurology

## 2022-10-27 ENCOUNTER — Telehealth: Payer: Self-pay | Admitting: Podiatry

## 2022-10-27 DIAGNOSIS — R202 Paresthesia of skin: Secondary | ICD-10-CM | POA: Diagnosis not present
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
## 2022-11-01 DIAGNOSIS — G4733 Obstructive sleep apnea (adult) (pediatric): Secondary | ICD-10-CM | POA: Diagnosis not present

## 2022-11-03 ENCOUNTER — Ambulatory Visit: Payer: Medicare PPO | Admitting: Dermatology

## 2022-11-03 ENCOUNTER — Encounter: Payer: Self-pay | Admitting: Dermatology

## 2022-11-03 ENCOUNTER — Encounter: Payer: Self-pay | Admitting: Podiatry

## 2022-11-03 ENCOUNTER — Ambulatory Visit: Payer: Medicare PPO | Admitting: Podiatry

## 2022-11-03 VITALS — BP 123/60 | HR 87

## 2022-11-03 DIAGNOSIS — Z1283 Encounter for screening for malignant neoplasm of skin: Secondary | ICD-10-CM | POA: Diagnosis not present

## 2022-11-03 DIAGNOSIS — D229 Melanocytic nevi, unspecified: Secondary | ICD-10-CM

## 2022-11-03 DIAGNOSIS — W908XXA Exposure to other nonionizing radiation, initial encounter: Secondary | ICD-10-CM

## 2022-11-03 DIAGNOSIS — L578 Other skin changes due to chronic exposure to nonionizing radiation: Secondary | ICD-10-CM

## 2022-11-03 DIAGNOSIS — Z86018 Personal history of other benign neoplasm: Secondary | ICD-10-CM

## 2022-11-03 DIAGNOSIS — L821 Other seborrheic keratosis: Secondary | ICD-10-CM

## 2022-11-03 DIAGNOSIS — S90822A Blister (nonthermal), left foot, initial encounter: Secondary | ICD-10-CM

## 2022-11-03 DIAGNOSIS — D1801 Hemangioma of skin and subcutaneous tissue: Secondary | ICD-10-CM

## 2022-11-03 DIAGNOSIS — L814 Other melanin hyperpigmentation: Secondary | ICD-10-CM | POA: Diagnosis not present

## 2022-11-03 DIAGNOSIS — D2261 Melanocytic nevi of right upper limb, including shoulder: Secondary | ICD-10-CM

## 2022-11-03 DIAGNOSIS — Z872 Personal history of diseases of the skin and subcutaneous tissue: Secondary | ICD-10-CM | POA: Diagnosis not present

## 2022-11-03 DIAGNOSIS — L570 Actinic keratosis: Secondary | ICD-10-CM | POA: Diagnosis not present

## 2022-11-03 MED ORDER — SILVER SULFADIAZINE 1 % EX CREA: 1.0000 | TOPICAL_CREAM | Freq: Every day | CUTANEOUS | 1 refills | Status: DC
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
## 2022-11-03 NOTE — Progress Notes (Signed)
Follow-Up Visit   Subjective  Emily Pitts is a 67 y.o. 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 The patient has spots, moles and lesions to be evaluated, some may be new or changing and the patient may have concern these could be cancer.    The following portions of the chart were reviewed this encounter and updated as appropriate: medications, allergies, medical history  Review of Systems:  No other skin or systemic complaints except as noted in HPI or Assessment and Plan.  Objective  Well appearing patient in no apparent distress; mood and affect are within normal limits.  A full examination was performed including scalp, head, eyes, ears, nose, lips, neck, chest, axillae, abdomen, back, buttocks, bilateral upper extremities, bilateral lower extremities, hands, feet, fingers, toes, fingernails, and toenails. All findings within normal limits unless otherwise noted below.   Relevant physical exam findings are noted in the Assessment and Plan.  right dorsal hand x 2, right forearm x 1, left dorsal hand x 2, left anterior thigh x 1 (6) Erythematous thin papules/macules with gritty scale.     Assessment & Plan   SKIN CANCER SCREENING PERFORMED TODAY.  ACTINIC DAMAGE - Chronic condition, secondary to cumulative UV/sun exposure - diffuse scaly erythematous macules with underlying dyspigmentation - Recommend daily broad spectrum sunscreen SPF 30+ to sun-exposed areas, reapply every 2 hours as needed.  - Staying in the shade or wearing long sleeves, sun glasses (UVA+UVB protection) and wide brim hats (4-inch brim around the entire circumference of the hat) are also recommended for sun protection.  - Call for new or changing lesions.  LENTIGINES, SEBORRHEIC KERATOSES, HEMANGIOMAS -  Benign normal skin lesions - Benign-appearing - Call for any changes  Sks - inframammary area  MELANOCYTIC NEVI - Tan-brown and/or pink-flesh-colored symmetric macules and papules - Benign appearing on exam today - Observation - Call clinic for new or changing moles - Recommend daily use of broad spectrum spf 30+ sunscreen to sun-exposed areas.   Right upper arm - cellular blue nevus without atypia bx proven    Actinic keratosis (6) right dorsal hand x 2, right forearm x 1, left dorsal hand x 2, left anterior thigh x 1  Actinic keratoses are precancerous spots that appear secondary to cumulative UV radiation exposure/sun exposure over time. They are chronic with expected duration over 1 year. A portion of actinic keratoses will progress to squamous cell carcinoma of the skin. It is not possible to reliably predict which spots will progress to skin cancer and so treatment is recommended to prevent development of skin cancer.  Recommend daily broad spectrum sunscreen SPF 30+ to sun-exposed areas, reapply every 2 hours as needed.  Recommend staying in the shade or wearing long sleeves, sun glasses (UVA+UVB protection) and wide brim hats (4-inch brim around the entire circumference of the hat). Call for new or changing lesions.  Destruction of lesion - right dorsal hand x 2, right forearm x 1, left dorsal hand x 2, left anterior thigh x 1 (6)  Destruction method: cryotherapy   Informed consent: discussed and consent obtained   Lesion destroyed using liquid nitrogen: Yes   Region frozen until ice ball extended beyond lesion: Yes   Outcome: patient tolerated procedure well with no complications   Post-procedure details: wound  care instructions given   Additional details:  Prior to procedure, discussed risks of blister formation, small wound, skin dyspigmentation, or rare scar following cryotherapy. Recommend Vaseline ointment to treated areas while healing.    Return in about 1 year  (around 11/03/2023) for TBSE.  I, Asher Muir, CMA, am acting as scribe for Elie Goody, MD.   Documentation: I have reviewed the above documentation for accuracy and completeness, and I agree with the above.  Elie Goody, MD

## 2022-11-03 NOTE — Patient Instructions (Addendum)
Actinic keratoses are precancerous spots that appear secondary to cumulative UV radiation exposure/sun exposure over time. They are chronic with expected duration over 1 year. A portion of actinic keratoses will progress to squamous cell carcinoma of the skin. It is not possible to reliably predict which spots will progress to skin cancer and so treatment is recommended to prevent development of skin cancer.  Recommend daily broad spectrum sunscreen SPF 30+ to sun-exposed areas, reapply every 2 hours as needed.  Recommend staying in the shade or wearing long sleeves, sun glasses (UVA+UVB protection) and wide brim hats (4-inch brim around the entire circumference of the hat). Call for new or changing lesions.    Cryotherapy Aftercare  Wash gently with soap and water everyday.   Apply Vaseline and Band-Aid daily until healed.     Melanoma ABCDEs  Melanoma is the most dangerous type of skin cancer, and is the leading cause of death from skin disease.  You are more likely to develop melanoma if you: Have light-colored skin, light-colored eyes, or red or blond hair Spend a lot of time in the sun Tan regularly, either outdoors or in a tanning bed Have had blistering sunburns, especially during childhood Have a close family member who has had a melanoma Have atypical moles or large birthmarks  Early detection of melanoma is key since treatment is typically straightforward and cure rates are extremely high if we catch it early.   The first sign of melanoma is often a change in a mole or a new dark spot.  The ABCDE system is a way of remembering the signs of melanoma.  A for asymmetry:  The two halves do not match. B for border:  The edges of the growth are irregular. C for color:  A mixture of colors are present instead of an even brown color. D for diameter:  Melanomas are usually (but not always) greater than 6mm - the size of a pencil eraser. E for evolution:  The spot keeps changing in  size, shape, and color.  Please check your skin once per month between visits. You can use a small mirror in front and a large mirror behind you to keep an eye on the back side or your body.   If you see any new or changing lesions before your next follow-up, please call to schedule a visit.  Please continue daily skin protection including broad spectrum sunscreen SPF 30+ to sun-exposed areas, reapplying every 2 hours as needed when you're outdoors.   Staying in the shade or wearing long sleeves, sun glasses (UVA+UVB protection) and wide brim hats (4-inch brim around the entire circumference of the hat) are also recommended for sun protection.    Due to recent changes in healthcare laws, you may see results of your pathology and/or laboratory studies on MyChart before the doctors have had a chance to review them. We understand that in some cases there may be results that are confusing or concerning to you. Please understand that not all results are received at the same time and often the doctors may need to interpret multiple results in order to provide you with the best plan of care or course of treatment. Therefore, we ask that you please give [place] 2 business days to thoroughly review all your results before contacting the office for clarification. Should we see a critical lab result, you will be contacted sooner.   If You Need Anything After Your Visit  If you have any questions or concerns for  your doctor, please call our main line at [phone] and press option 4 to reach your doctor's medical assistant. If no one answers, please leave a voicemail as directed and we will return your call as soon as possible. Messages left after 4 pm will be answered the following business day.   You may also send [place] a message via MyChart. We typically respond to MyChart messages within 1-2 business days.  For prescription refills, please ask your pharmacy to contact our office. Our fax number is  [phone].  If you have an urgent issue when the clinic is closed that cannot wait until the next business day, you can page your doctor at the number below.    Please note that while we do our best to be available for urgent issues outside of office hours, we are not available 24/7.   If you have an urgent issue and are unable to reach [place], you may choose to seek medical care at your doctor's office, retail clinic, urgent care center, or emergency room.  If you have a medical emergency, please immediately call 911 or go to the emergency department.  Pager Numbers  - Dr. Gwen Pounds: [phone]  - Dr. Roseanne Reno: [phone]  - Dr. Katrinka Blazing: [phone]   In the event of inclement weather, please call our main line at [phone] for an update on the status of any delays or closures.  Dermatology Medication Tips: Please keep the boxes that topical medications come in in order to help keep track of the instructions about where and how to use these. Pharmacies typically print the medication instructions only on the boxes and not directly on the medication tubes.   If your medication is too expensive, please contact our office at [phone] option 4 or send [place] a message through MyChart.   We are unable to tell what your co-pay for medications will be in advance as this is different depending on your insurance coverage. However, we may be able to find a substitute medication at lower cost or fill out paperwork to get insurance to cover a needed medication.   If a prior authorization is required to get your medication covered by your insurance company, please allow [place] 1-2 business days to complete this process.  Drug prices often vary depending on where the prescription is filled and some pharmacies may offer cheaper prices.  The website www.goodrx.com contains coupons for medications through different pharmacies. The prices here do not account for what the cost may be with help from  insurance (it may be cheaper with your insurance), but the website can give you the price if you did not use any insurance.  - You can print the associated coupon and take it with your prescription to the pharmacy.  - You may also stop by our office during regular business hours and pick up a GoodRx coupon card.  - If you need your prescription sent electronically to a different pharmacy, notify our office through [facility] or by phone at [phone] option 4.     Si Usted Necesita Algo Despus de Su Visita  Tambin puede enviarnos un mensaje a travs de Clinical cytogeneticist. Por lo general respondemos a los mensajes de MyChart en el transcurso de 1 a 2 das hbiles.  Para renovar recetas, por favor pida a su farmacia que se ponga en contacto con nuestra oficina. Annie Sable de fax es [place] [phone].  Si tiene un asunto urgente cuando la clnica est cerrada y que no puede esperar Teacher, adult education  el siguiente da hbil, puede llamar/localizar a su doctor(a) al nmero que aparece a continuacin.   Por favor, tenga en cuenta que aunque hacemos todo lo posible para estar disponibles para asuntos urgentes fuera del horario de [place], no estamos disponibles las 24 horas del da, los 7 [address]  [address] de la [place].   Si tiene un problema urgente y no puede comunicarse con nosotros, puede optar por buscar atencin mdica  en el consultorio de su doctor(a), en una clnica privada, en un centro de atencin urgente o en una sala de emergencias.  Si tiene Engineer, drilling, por favor llame inmediatamente al 911 o vaya a la sala de emergencias.  Nmeros de bper  - Dr. Gwen Pounds: [phone]  - Dra. Roseanne Reno: [phone]  - Dr. Katrinka Blazing: [phone]   En caso de inclemencias del tiempo, por favor llame a Lacy Duverney principal al [phone] para una actualizacin sobre el [place] de cualquier retraso o cierre.  Consejos para la medicacin en dermatologa: Por favor, guarde las cajas en las que vienen los  medicamentos de uso tpico para ayudarle a seguir las instrucciones sobre dnde y cmo usarlos. Las farmacias generalmente imprimen las instrucciones del medicamento slo en las cajas y no directamente en los tubos del [place].   Si su medicamento es muy caro, por favor, pngase en contacto con Rolm Gala llamando al [phone] y presione la opcin 4 o envenos un mensaje a travs de Clinical cytogeneticist.   No podemos decirle cul ser su copago por los medicamentos por adelantado ya que esto es diferente dependiendo de la cobertura de su seguro. Sin embargo, es posible que podamos encontrar un medicamento sustituto a Audiological scientist un formulario para que el seguro cubra el medicamento que se considera necesario.   Si se requiere una autorizacin previa para que su compaa de seguros [place] su medicamento, por favor permtanos de 1 a 2 das hbiles para completar [address]  Los precios de los medicamentos varan con frecuencia dependiendo del Environmental consultant de dnde se surte la receta y alguna farmacias pueden ofrecer precios ms baratos.  El sitio web www.goodrx.com tiene cupones para medicamentos de Health and safety inspector. Los precios aqu no tienen en cuenta lo que podra costar con la ayuda del seguro (puede ser ms barato con su seguro), pero el sitio web puede darle el precio si no utiliz Tourist information centre manager.  - Puede imprimir el cupn correspondiente y llevarlo con su receta a la farmacia.  - Tambin puede pasar por nuestra oficina durante el horario de atencin regular y Education officer, museum una tarjeta de cupones de GoodRx.  - Si necesita que su receta se enve electrnicamente a una farmacia diferente, informe a nuestra oficina a travs de MyChart de [place] o por telfono llamando al [phone] y presione la opcin 4.

## 2022-12-05 ENCOUNTER — Other Ambulatory Visit: Payer: Self-pay | Admitting: Family Medicine

## 2022-12-05 DIAGNOSIS — J302 Other seasonal allergic rhinitis: Secondary | ICD-10-CM

## 2022-12-09 DIAGNOSIS — G5600 Carpal tunnel syndrome, unspecified upper limb: Secondary | ICD-10-CM | POA: Diagnosis not present

## 2022-12-09 DIAGNOSIS — R29810 Facial weakness: Secondary | ICD-10-CM | POA: Diagnosis not present

## 2022-12-09 DIAGNOSIS — E1142 Type 2 diabetes mellitus with diabetic polyneuropathy: Secondary | ICD-10-CM | POA: Diagnosis not present

## 2022-12-09 DIAGNOSIS — G4733 Obstructive sleep apnea (adult) (pediatric): Secondary | ICD-10-CM | POA: Diagnosis not present

## 2022-12-10 ENCOUNTER — Ambulatory Visit (INDEPENDENT_AMBULATORY_CARE_PROVIDER_SITE_OTHER): Payer: Medicare PPO

## 2022-12-10 DIAGNOSIS — Z23 Encounter for immunization: Secondary | ICD-10-CM | POA: Diagnosis not present

## 2023-01-04 ENCOUNTER — Other Ambulatory Visit: Payer: Self-pay | Admitting: Family Medicine

## 2023-01-04 DIAGNOSIS — J3089 Other allergic rhinitis: Secondary | ICD-10-CM

## 2023-01-14 ENCOUNTER — Other Ambulatory Visit: Payer: Self-pay | Admitting: Family Medicine

## 2023-01-14 DIAGNOSIS — E785 Hyperlipidemia, unspecified: Secondary | ICD-10-CM

## 2023-02-08 ENCOUNTER — Other Ambulatory Visit: Payer: Self-pay | Admitting: Family Medicine

## 2023-02-08 DIAGNOSIS — Z1231 Encounter for screening mammogram for malignant neoplasm of breast: Secondary | ICD-10-CM

## 2023-02-12 NOTE — Progress Notes (Signed)
Name: Emily Pitts   MRN: [id number]    DOB: 07/09/1955   Date:02/23/2023       Progress Note  Subjective  Chief Complaint  Chief Complaint  Patient presents with   Medical Management of Chronic Issues    HPI  Discussed the use of AI scribe software for clinical note transcription with the patient, who gave verbal consent to proceed.  History of Present Illness   The patient, with a history of diabetes, neuropathy, and hypertension, presents with a significant increase in her A1c from 5.5 to 7. Despite daily Rybelsus, she reports persistent and intense hunger, even waking up in the middle of the night feeling hungry. She has gained significant weight, increasing from 212 to 230.2 pounds.  The patient has been under the care of a neurologist for facial weakness and underwent a sleep study, MRI, and nerve conduction study. The neurologist initially suspected a mild stroke, but this was ruled out by the MRI. The patient was diagnosed with generalized severe motor polyneuropathy and advised to take meticulous care of her feet. She is currently on pregabalin, which she reports helps with the neuropathy symptoms.   The patient also reports lower back pain, described as a sudden twinge when getting up. She attributes this to her recent inactivity and weight gain. She denies any urinary symptoms or bleeding.  The patient's medication regimen includes metformin, which was recently reduced to one tablet a day, and Rybelsus, which was reduced from 14 to 7 since A1C was down to 5.5 % and patient wanted to decrease medication. She also takes atorvastatin for dyslipidemia and Xarelto for a factor V Leiden mutation and history of  portal vein thrombosis. She used to see hematologist but not longer under their care    She has been compliant with her B12, vitamin D, and magnesium supplements.  The patient has been up-to-date with her immunizations, including pneumonia, shingles, flu, and tetanus vaccines.  She has not yet received the PCV20 pneumonia vaccine or the RSV vaccine.         Patient Active Problem List   Diagnosis Date Noted   Low serum vitamin B12 05/19/2022   Type 2 diabetes mellitus with microalbuminuria, without long-term current use of insulin (HCC) 05/19/2022   Hypertension associated with diabetes (HCC) 02/12/2022   Iron deficiency anemia 02/14/2018   Chondromalacia, right knee 10/30/2016   Long term current use of anticoagulant therapy 01/05/2016   Factor 5 Leiden mutation, heterozygous (HCC) 05/02/2015   Portal vein thrombosis 11/07/2014   History of sepsis 10/10/2014   Abnormal electrocardiogram 10/08/2014   Nonspecific abnormal electromyogram (EMG) 10/08/2014   Benign essential HTN 10/08/2014   Controlled type 2 diabetes with neuropathy (HCC) 10/08/2014   Dyslipidemia 10/08/2014   Low back pain 10/08/2014   Gastro-esophageal reflux disease without esophagitis 10/08/2014   Microalbuminuria 10/08/2014   Disturbance of skin sensation 10/08/2014   Obesity (BMI 30-39.9) 10/08/2014   Perennial allergic rhinitis with seasonal variation 10/08/2014   Plantar fasciitis 10/08/2014   Postablative ovarian failure 10/08/2014   Menopausal symptom 10/08/2014   Vitamin D deficiency 10/08/2014    Past Surgical History:  Procedure Laterality Date   ABDOMINAL HYSTERECTOMY  2010   APPENDECTOMY  1966   COLONOSCOPY  06-07-13   Dr Lemar Livings   DILATION AND CURETTAGE OF UTERUS     OOPHORECTOMY      Family History  Problem Relation Age of Onset   Diabetes Mother    COPD Mother    Diabetes  Father    Hypertension Father    Cancer Father        Kidney and Prostate   CAD Father    Diabetes Brother        Oldest Brother   Kidney disease Brother        Tumor removed   Breast cancer Neg Hx     Social History   Tobacco Use   Smoking status: Never   Smokeless tobacco: Never  Substance Use Topics   Alcohol use: No    Alcohol/week: 0.0 standard drinks of alcohol      Current Outpatient Medications:    azelastine (ASTELIN) 0.1 % nasal spray, Place 2 sprays into both nostrils 2 (two) times daily. Use in each nostril as directed, Disp: 30 mL, Rfl: 2   cholecalciferol (VITAMIN D) 1000 units tablet, Take 500 Units by mouth daily. , Disp: , Rfl:    fluticasone (FLONASE) 50 MCG/ACT nasal spray, USE 2 SPRAY(S) IN EACH NOSTRIL AS NEEDED, Disp: 48 g, Rfl: 0   Iron-FA-B Cmp-C-Biot-Probiotic (FUSION PLUS PO), Take 1 tablet by mouth daily. Vitamin with iron, Disp: , Rfl:    levocetirizine (XYZAL) 5 MG tablet, Take 1 tablet (5 mg total) by mouth daily., Disp: 90 tablet, Rfl: 1   MAGNESIUM OXIDE PO, Take 500 mg by mouth daily. , Disp: , Rfl:    metFORMIN (GLUCOPHAGE-XR) 750 MG 24 hr tablet, Take 1 tablet (750 mg total) by mouth daily with breakfast., Disp: 90 tablet, Rfl: 1   montelukast (SINGULAIR) 10 MG tablet, TAKE 1 TABLET BY MOUTH AT BEDTIME, Disp: 90 tablet, Rfl: 0   olmesartan-hydrochlorothiazide (BENICAR HCT) 20-12.5 MG tablet, Take 1 tablet by mouth daily., Disp: 90 tablet, Rfl: 1   polyethylene glycol powder (GLYCOLAX/MIRALAX) powder, Take 17 g by mouth 2 (two) times daily., Disp: 3350 g, Rfl: 1   pregabalin (LYRICA) 100 MG capsule, Take 1 capsule (100 mg total) by mouth 3 (three) times daily., Disp: 270 capsule, Rfl: 1   pregabalin (LYRICA) 200 MG capsule, Take 1 capsule (200 mg total) by mouth at bedtime., Disp: 90 capsule, Rfl: 1   rivaroxaban (XARELTO) 20 MG TABS tablet, Take 1 tablet (20 mg total) by mouth daily with supper., Disp: 90 tablet, Rfl: 1   tirzepatide (MOUNJARO) 5 MG/0.5ML Pen, Inject 5 mg into the skin once a week., Disp: 6 mL, Rfl: 0   vitamin B-12 (CYANOCOBALAMIN) 500 MCG tablet, Take 1,000 mcg by mouth daily., Disp: , Rfl:    atorvastatin (LIPITOR) 40 MG tablet, Take 1 tablet (40 mg total) by mouth daily., Disp: 90 tablet, Rfl: 3   glucose blood (ONE TOUCH ULTRA TEST) test strip, USE AS DIRECTED (Patient not taking: Reported on  02/23/2023), Disp: 100 each, Rfl: 12  No Known Allergies  I personally reviewed active problem list, medication list, allergies, family history with the patient/caregiver today.   ROS  Ten systems reviewed and is negative except as mentioned in HPI    Objective  Vitals:   02/23/23 0744  BP: 118/72  Pulse: 86  Resp: 16  Temp: 98.7 F (37.1 C)  TempSrc: Oral  SpO2: 96%  Weight: 230 lb 3.2 oz (104.4 kg)  Height: 5\' 9"  (1.753 m)    Body mass index is 33.99 kg/m.  Physical Exam  Constitutional: Patient appears well-developed and well-nourished. Obese  No distress.  HEENT: head atraumatic, normocephalic, pupils equal and reactive to light, neck supple Cardiovascular: Normal rate, regular rhythm and normal heart sounds.  No murmur heard.  No BLE edema. Pulmonary/Chest: Effort normal and breath sounds normal. No respiratory distress. Abdominal: Soft.  There is no tenderness. Psychiatric: Patient has a normal mood and affect. behavior is normal. Judgment and thought content normal.   Recent Results (from the past 2160 hours)  POCT glycosylated hemoglobin (Hb A1C)     Status: Abnormal   Collection Time: 02/23/23  7:55 AM  Result Value Ref Range   Hemoglobin A1C 7.0 (A) 4.0 - 5.6 %   HbA1c POC (<> result, manual entry)     HbA1c, POC (prediabetic range)     HbA1c, POC (controlled diabetic range)      Diabetic Foot Exam: Diabetic Foot Exam - Simple   Simple Foot Form Visual Inspection No deformities, no ulcerations, no other skin breakdown bilaterally: Yes Sensation Testing See comments: Yes Pulse Check Posterior Tibialis and Dorsalis pulse intact bilaterally: Yes Comments First toenail on right foot some subungual hematoma, advised to take monthly pictures to make sure it is moving down       PHQ2/9:    02/23/2023    7:44 AM 10/26/2022   11:41 AM 10/21/2022    8:00 AM 05/19/2022    1:16 PM 04/23/2022    3:42 PM  Depression screen PHQ 2/9  Decreased Interest 0 0  0 0 0  Down, Depressed, Hopeless 0 0 0 0 0  PHQ - 2 Score 0 0 0 0 0  Altered sleeping 0  0 0   Tired, decreased energy 0  0 0   Change in appetite 0  0 0   Feeling bad or failure about yourself  0  0 0   Trouble concentrating 0  0 0   Moving slowly or fidgety/restless 0  0 0   Suicidal thoughts 0  0 0   PHQ-9 Score 0  0 0   Difficult doing work/chores Not difficult at all        phq 9 is negative   Fall Risk:    02/23/2023    7:36 AM 10/26/2022   11:41 AM 10/21/2022    7:59 AM 05/19/2022    1:15 PM 04/23/2022    3:40 PM  Fall Risk   Falls in the past year? 0 0 0 0 0  Number falls in past yr: 0 0 0  0  Injury with Fall? 0 0 0  0  Risk for fall due to : No Fall Risks  No Fall Risks No Fall Risks No Fall Risks  Follow up Falls prevention discussed;Education provided;Falls evaluation completed  Falls prevention discussed Falls prevention discussed Education provided;Falls prevention discussed      Assessment & Plan  Assessment and Plan    Type 2 Diabetes Mellitus with associated microalbuminuria, neuropathy, dyslipidemia and HTN A1c increased from 5.5 to 7.0. Patient reports increased hunger and weight gain. Currently on Metformin 750mg  and Rybelsus, which is not curbing appetite as expected. - Continue  Metformin 750 mg daily  - continue BP medication, bp is at goal   - Discontinue Rybelsus. - Initiate Mounjaro 5mg  weekly. - Follow-up in 2 months to assess response to [facility] and weight loss progress.  Peripheral Neuropathy Patient reports numbness despite Pregabalin therapy, which is providing some relief. - Continue Pregabalin 100mg  in the morning, 100mg  in the afternoon, and 200mg  at night.  Factor V Leiden Mutation/history of portal vein thrombosis  Patient is currently on Xarelto with no reported bleeding or anemia. - Continue Xarelto.  Hyperlipidemia Last cholesterol was 38, indicating good control. -  Continue Atorvastatin.  Low Back Pain Patient reports  intermittent twinges in lower back, likely due to weight gain and inactivity. - Encourage patient to increase physical activity, including exercises to strengthen core and quads.  General Health Maintenance - Continue B12, Vitamin D, and Magnesium supplements. - Continue Azelastatin for allergies. - Plan to administer PCV20 next year for pneumonia prevention. - Encourage patient to consider RSV vaccine at local pharmacy.

## 2023-02-23 ENCOUNTER — Encounter: Payer: Self-pay | Admitting: Family Medicine

## 2023-02-23 ENCOUNTER — Ambulatory Visit: Payer: Medicare PPO | Admitting: Family Medicine

## 2023-02-23 VITALS — BP 118/72 | HR 86 | Temp 98.7°F | Resp 16 | Ht 69.0 in | Wt 230.2 lb

## 2023-02-23 DIAGNOSIS — J3089 Other allergic rhinitis: Secondary | ICD-10-CM

## 2023-02-23 DIAGNOSIS — E1159 Type 2 diabetes mellitus with other circulatory complications: Secondary | ICD-10-CM

## 2023-02-23 DIAGNOSIS — I10 Essential (primary) hypertension: Secondary | ICD-10-CM | POA: Diagnosis not present

## 2023-02-23 DIAGNOSIS — I81 Portal vein thrombosis: Secondary | ICD-10-CM

## 2023-02-23 DIAGNOSIS — E785 Hyperlipidemia, unspecified: Secondary | ICD-10-CM

## 2023-02-23 DIAGNOSIS — E1140 Type 2 diabetes mellitus with diabetic neuropathy, unspecified: Secondary | ICD-10-CM

## 2023-02-23 DIAGNOSIS — E538 Deficiency of other specified B group vitamins: Secondary | ICD-10-CM

## 2023-02-23 DIAGNOSIS — D6851 Activated protein C resistance: Secondary | ICD-10-CM | POA: Diagnosis not present

## 2023-02-23 DIAGNOSIS — M545 Low back pain, unspecified: Secondary | ICD-10-CM

## 2023-02-23 DIAGNOSIS — J302 Other seasonal allergic rhinitis: Secondary | ICD-10-CM

## 2023-02-23 DIAGNOSIS — I152 Hypertension secondary to endocrine disorders: Secondary | ICD-10-CM

## 2023-02-23 DIAGNOSIS — K219 Gastro-esophageal reflux disease without esophagitis: Secondary | ICD-10-CM

## 2023-02-23 LAB — POCT GLYCOSYLATED HEMOGLOBIN (HGB A1C): Hemoglobin A1C: 7 % — AB (ref 4.0–5.6)

## 2023-02-23 MED ORDER — ATORVASTATIN CALCIUM 40 MG PO TABS: 40.0000 mg | ORAL_TABLET | Freq: Every day | ORAL | 3 refills | Status: DC

## 2023-02-23 MED ORDER — TIRZEPATIDE 5 MG/0.5ML ~~LOC~~ SOAJ
5.0000 mg | SUBCUTANEOUS | 0 refills | Status: DC
Start: 2023-02-23 — End: 2023-04-27

## 2023-03-12 ENCOUNTER — Ambulatory Visit
Admission: RE | Admit: 2023-03-12 | Discharge: 2023-03-12 | Disposition: A | Discharge status: Home / Self Care | Payer: Medicare PPO | Source: Ambulatory Visit | Attending: Family Medicine | Admitting: Family Medicine

## 2023-03-12 DIAGNOSIS — Z1231 Encounter for screening mammogram for malignant neoplasm of breast: Secondary | ICD-10-CM | POA: Diagnosis not present

## 2023-03-12 NOTE — Therapy (Signed)
 OUTPATIENT PHYSICAL THERAPY BALANCE EVALUATION   Patient Name: Emily Pitts MRN: [id number] DOB:[birth year], 68 y.o., female Today's Date: 03/15/2023  END OF SESSION:  PT End of Session - 03/15/23 0915     Visit Number 1    Number of Visits 17    Authorization Type eval: 03/15/23    PT Start Time 0925    PT Stop Time 1010    PT Time Calculation (min) 45 min    Equipment Utilized During Treatment Gait belt    Activity Tolerance Patient tolerated treatment well    Behavior During Therapy WFL for tasks assessed/performed            Past Medical History:  Diagnosis Date   Allergy    Diabetes mellitus without complication (HCC) 2012   Hyperlipidemia    Hypertension    Low back pain, episodic    Numbness of feet    Vitamin D  deficiency    Past Surgical History:  Procedure Laterality Date   ABDOMINAL HYSTERECTOMY  2010   APPENDECTOMY  1966   COLONOSCOPY  06-07-13   Dr Dessa   DILATION AND CURETTAGE OF UTERUS     OOPHORECTOMY     Patient Active Problem List   Diagnosis Date Noted   Low serum vitamin B12 05/19/2022   Type 2 diabetes mellitus with microalbuminuria, without long-term current use of insulin  (HCC) 05/19/2022   Hypertension associated with diabetes (HCC) 02/12/2022   Iron  deficiency anemia 02/14/2018   Chondromalacia, right knee 10/30/2016   Long term current use of anticoagulant therapy 01/05/2016   Factor 5 Leiden mutation, heterozygous (HCC) 05/02/2015   Portal vein thrombosis 11/07/2014   History of sepsis 10/10/2014   Abnormal electrocardiogram 10/08/2014   Nonspecific abnormal electromyogram (EMG) 10/08/2014   Benign essential HTN 10/08/2014   Controlled type 2 diabetes with neuropathy (HCC) 10/08/2014   Dyslipidemia 10/08/2014   Low back pain 10/08/2014   Gastro-esophageal reflux disease without esophagitis 10/08/2014   Microalbuminuria 10/08/2014   Disturbance of skin sensation 10/08/2014   Obesity (BMI 30-39.9) 10/08/2014   Perennial allergic  rhinitis with seasonal variation 10/08/2014   Plantar fasciitis 10/08/2014   Postablative ovarian failure 10/08/2014   Menopausal symptom 10/08/2014   Vitamin D  deficiency 10/08/2014    PCP: Sowles, Krichna, MD  REFERRING PROVIDER: Maree Jannett POUR, MD   REFERRING DIAG: E11.42 (ICD-10-CM) - Diabetic peripheral neuropathy (HCC)  RATIONALE FOR EVALUATION AND TREATMENT: Rehabilitation  THERAPY DIAG: Unsteadiness on feet  ONSET DATE: several months  FOLLOW-UP APPT SCHEDULED WITH REFERRING PROVIDER: Yes    SUBJECTIVE:  SUBJECTIVE STATEMENT:  Unsteadiness  PERTINENT HISTORY:  Pt referred to PT by neurology for imbalance. She has a history of diabetic peripheral neuropathy and B12 deficiency. She complains imbalance for the last several months particularly with eyes closed/darkeness such as when showering or getting up at night to go to the bathroom. Pt reports drifting when walking. She has a distant history of BPPV improved with the Epley maneuver. Pt reports that she stil occasionally feels swimmy headed in the morning. MRI Brain unremarkable and pt did not meet criteria for CPAP during sleep study. She has factor V leiden mutation and is on Xarelto  for portal vein thrombosis. She saw hematologist but released from their care in 2022.  09/01/2022 NCS upper extremities -  This is an abnormal electrodiagnostic exam consistent with 1) Right mild (grade II) carpal tunnel syndrome (median nerve entrapment at wrist).   08/26/2022 NCS lower extremities -  This is an abnormal electrodiagnostic study consistent with a generalized severe motor polyneuropathy, this study may over estimate degree of polyneuropathy due to edema and adipose tissue.  Brain MRI 10/27/22: IMPRESSION: Negative brain MRI.  No infarct or  other explanation for symptoms.    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 No,   Living Environment Lives with: lives alone Lives in: Mobile home Stairs: 5 steps, bilateral narrow hand rails Has following equipment at home: Single point cane, occasionally uses when walking longer distances  Prior level of function: Independent  Occupational demands: Retired from audiological scientist work  Hobbies: Running errands, spending time with extended family  Patient Goals: Primary concern is imbalance while in the shower;   OBJECTIVE:   Patient Surveys  FOTO: 38, predicted improvement to 2; ABC: To be completed  Cognition Patient is oriented to person, place, and time.  Recent memory is intact.  Remote memory is intact.  Attention span and concentration are intact.  Expressive speech is intact.  Patient's fund of knowledge is within normal limits for educational level.    Gross Musculoskeletal Assessment Tremor: None Bulk: Normal Tone: Normal  Posture: No gross abnormalities noted in standing or seated posture  AROM No gross functional deficits identified;  LE MMT: MMT (out of 5) Right  Left   Hip flexion 4 4  Hip extension    Hip abduction (seated) 4 4  Hip adduction (seated) 4 4  Hip internal rotation    Hip external rotation    Knee flexion (seated) 5 5  Knee extension 5 5  Ankle dorsiflexion 4+ 4+  (* = pain; Blank rows =  not tested)  Sensation Stocking distribution neuropathy bilateral feet up to ankles;  Reflexes Deferred  Cranial Nerves Deferred  Coordination/Cerebellar Deferred  Bed mobility: Deferred  Transfers: Assistive device utilized: None  Sit to stand: Complete Independence Stand to sit: Complete Independence Chair to chair: Complete Independence Floor:  Deferred  Curb:  Deferred  Stairs: Level of Assistance: Complete Independence Stair Negotiation Technique: Alternating Pattern  with No Rails Number of Stairs: 4  Height of Stairs: 6  Comments:  No deficits identified  Gait: Gait pattern: WFL Distance walked: 100' Assistive device utilized: None Level of assistance: Complete Independence Comments: No significant deficits identified. No evidence of foot drop;  Functional Outcome Measures  Results Comments  BERG 55/56   DGI    FGA    TUG 8.8 seconds   5TSTS 12.2 seconds   6 Minute Walk Test    10 Meter Gait Speed    (Blank rows = not tested)  mCTSIB: 30s in all 4 conditions, significant sway in condition 4;  BPPV TESTS:  Symptoms Duration Intensity Nystagmus  L Dix-Hallpike None   None  R Dix-Hallpike None   None  L Head Roll Dizziness 1-2s mild Possible 1-2 L torsional beats;  R Head Roll None   None  L Sidelying Test None   None  R Sidelying Test      (blank = not tested)   TODAY'S TREATMENT  Deferred   PATIENT EDUCATION:  Education details: Plan of care Person educated: Patient Education method: Explanation Education comprehension: verbalized understanding   HOME EXERCISE PROGRAM:  Deferred  ASSESSMENT:  CLINICAL IMPRESSION: Patient is a 68 y.o. female who was seen today for physical therapy evaluation and treatment for imbalance. Overall her balance appears quite good today during evaluation. She does demonstrate considerable sway in condition 4 of the mCTSIB but does not lose her balance. Plan to perform additional dynamic balance testing  at next session and repeat BPPV testing which was not overtly positive today.   OBJECTIVE IMPAIRMENTS: decreased balance.   ACTIVITY LIMITATIONS: bathing  PARTICIPATION LIMITATIONS: community activity  PERSONAL FACTORS: Time since onset of injury/illness/exacerbation and 3+ comorbidities: neuropathy, DMII, and anemia  are also affecting patient's functional outcome.   REHAB POTENTIAL: Excellent  CLINICAL DECISION MAKING: Stable/uncomplicated  EVALUATION COMPLEXITY: Low   GOALS: Goals reviewed with patient? No  SHORT TERM GOALS: Target date: 04/12/2023   Pt will be independent with HEP in order to improve strength and balance in order to decrease fall risk and improve function at home. Baseline:  Goal status: INITIAL   LONG TERM GOALS: Target date: 05/10/2023   Pt will increase FOTO to at least 80 to demonstrate significant improvement in function at home related to balance  Baseline: 79  Goal status: INITIAL  2.  Pt will improve BERG by at least 3 points in order to demonstrate clinically significant improvement in balance.   Baseline: To be completed Goal status: INITIAL  3.  Pt will improve ABC by at least 13% in order to demonstrate clinically significant improvement in balance confidence.      Baseline: To be completed Goal status: INITIAL   PLAN: PT FREQUENCY: 2x/week  PT DURATION: 8 weeks  PLANNED INTERVENTIONS: Therapeutic exercises, Therapeutic activity, Neuromuscular re-education, Balance training, Gait training, Patient/Family education, Self Care, Joint mobilization, Joint manipulation, Vestibular training, Canalith repositioning, Orthotic/Fit training, DME instructions, Dry Needling, Electrical stimulation, Spinal manipulation, Spinal mobilization, Cryotherapy, Moist heat, Traction, Manual therapy, and Re-evaluation.  PLAN FOR NEXT SESSION: Repeat BPPV testing, DGI/FGA, consider MiniBESTest, initiate balance exercises and issue HEP   Selinda BIRCH Chimaobi Casebolt PT,  DPT, GCS  Jaeleen Inzunza 03/15/2023, 2:42 PM

## 2023-03-15 ENCOUNTER — Ambulatory Visit: Payer: Medicare PPO | Attending: Neurology

## 2023-03-15 DIAGNOSIS — R2681 Unsteadiness on feet: Secondary | ICD-10-CM | POA: Insufficient documentation

## 2023-03-20 NOTE — Therapy (Signed)
 OUTPATIENT PHYSICAL THERAPY BALANCE TREATMENT   Patient Name: Emily Pitts MRN: [id number] DOB:07-04-1955, 68 y.o., female Today's Date: 03/22/2023  END OF SESSION:  PT End of Session - 03/22/23 0941     Visit Number 2    Number of Visits 17    Authorization Type eval: 03/15/23    PT Start Time 0940    PT Stop Time 1025    PT Time Calculation (min) 45 min    Equipment Utilized During Treatment Gait belt    Activity Tolerance Patient tolerated treatment well    Behavior During Therapy WFL for tasks assessed/performed            Past Medical History:  Diagnosis Date   Allergy    Diabetes mellitus without complication (HCC) 2012   Hyperlipidemia    Hypertension    Low back pain, episodic    Numbness of feet    Vitamin D  deficiency    Past Surgical History:  Procedure Laterality Date   ABDOMINAL HYSTERECTOMY  2010   APPENDECTOMY  1966   COLONOSCOPY  06-07-13   Dr Dessa   DILATION AND CURETTAGE OF UTERUS     OOPHORECTOMY     Patient Active Problem List   Diagnosis Date Noted   Low serum vitamin B12 05/19/2022   Type 2 diabetes mellitus with microalbuminuria, without long-term current use of insulin  (HCC) 05/19/2022   Hypertension associated with diabetes (HCC) 02/12/2022   Iron  deficiency anemia 02/14/2018   Chondromalacia, right knee 10/30/2016   Long term current use of anticoagulant therapy 01/05/2016   Factor 5 Leiden mutation, heterozygous (HCC) 05/02/2015   Portal vein thrombosis 11/07/2014   History of sepsis 10/10/2014   Abnormal electrocardiogram 10/08/2014   Nonspecific abnormal electromyogram (EMG) 10/08/2014   Benign essential HTN 10/08/2014   Controlled type 2 diabetes with neuropathy (HCC) 10/08/2014   Dyslipidemia 10/08/2014   Low back pain 10/08/2014   Gastro-esophageal reflux disease without esophagitis 10/08/2014   Microalbuminuria 10/08/2014   Disturbance of skin sensation 10/08/2014   Obesity (BMI 30-39.9) 10/08/2014   Perennial allergic  rhinitis with seasonal variation 10/08/2014   Plantar fasciitis 10/08/2014   Postablative ovarian failure 10/08/2014   Menopausal symptom 10/08/2014   Vitamin D  deficiency 10/08/2014    PCP: Sowles, Krichna, MD  REFERRING PROVIDER: Maree Jannett POUR, MD   REFERRING DIAG: E11.42 (ICD-10-CM) - Diabetic peripheral neuropathy (HCC)  RATIONALE FOR EVALUATION AND TREATMENT: Rehabilitation  THERAPY DIAG: Unsteadiness on feet  ONSET DATE: several months  FOLLOW-UP APPT SCHEDULED WITH REFERRING PROVIDER: Yes   FROM INITIAL EVALUATION SUBJECTIVE:  SUBJECTIVE STATEMENT:  Unsteadiness  PERTINENT HISTORY:  Pt referred to PT by neurology for imbalance. She has a history of diabetic peripheral neuropathy and B12 deficiency. She complains imbalance for the last several months particularly with eyes closed/darkeness such as when showering or getting up at night to go to the bathroom. Pt reports drifting when walking. She has a distant history of BPPV improved with the Epley maneuver. Pt reports that she stil occasionally feels swimmy headed in the morning. MRI Brain unremarkable and pt did not meet criteria for CPAP during sleep study. She has factor V leiden mutation and is on Xarelto  for portal vein thrombosis. She saw hematologist but released from their care in 2022.  09/01/2022 NCS upper extremities -  This is an abnormal electrodiagnostic exam consistent with 1) Right mild (grade II) carpal tunnel syndrome (median nerve entrapment at wrist).   08/26/2022 NCS lower extremities -  This is an abnormal electrodiagnostic study consistent with a generalized severe motor polyneuropathy, this study may over estimate degree of polyneuropathy due to edema and adipose tissue.  Brain MRI 10/27/22: IMPRESSION: Negative brain  MRI.  No infarct or other explanation for symptoms.    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 No,   Living Environment Lives with: lives alone Lives in: Mobile home Stairs: 5 steps, bilateral narrow hand rails Has following equipment at home: Single point cane, occasionally uses when walking longer distances  Prior level of function: Independent  Occupational demands: Retired from audiological scientist work  Hobbies: Running errands, spending time with extended family  Patient Goals: Primary concern is imbalance while in the shower;   OBJECTIVE:   Patient Surveys  FOTO: 64, predicted improvement to 51; ABC: To be completed  Cognition Patient is oriented to person, place, and time.  Recent memory is intact.  Remote memory is intact.  Attention span and concentration are intact.  Expressive speech is intact.  Patient's fund of knowledge is within normal limits for educational level.    Gross Musculoskeletal Assessment Tremor: None Bulk: Normal Tone: Normal  Posture: No gross abnormalities noted in standing or seated posture  AROM No gross functional deficits identified;  LE MMT: MMT (out of 5) Right  Left   Hip flexion 4 4  Hip extension    Hip abduction (seated) 4 4  Hip adduction (seated) 4 4  Hip internal rotation    Hip external rotation    Knee flexion (seated) 5 5  Knee extension 5 5  Ankle dorsiflexion 4+ 4+  (* =  pain; Blank rows = not tested)  Sensation Stocking distribution neuropathy bilateral feet up to ankles;  Reflexes Deferred  Cranial Nerves Deferred  Coordination/Cerebellar Deferred  Bed mobility: Deferred  Transfers: Assistive device utilized: None  Sit to stand: Complete Independence Stand to sit: Complete Independence Chair to chair: Complete Independence Floor:  Deferred  Curb:  Deferred  Stairs: Level of Assistance: Complete Independence Stair Negotiation Technique: Alternating Pattern  with No Rails Number of Stairs: 4  Height of Stairs: 6  Comments:  No deficits identified  Gait: Gait pattern: WFL Distance walked: 100' Assistive device utilized: None Level of assistance: Complete Independence Comments: No significant deficits identified. No evidence of foot drop;  Functional Outcome Measures  Results Comments  BERG 55/56   DGI    FGA    TUG 8.8 seconds   5TSTS 12.2 seconds   6 Minute Walk Test    10 Meter Gait Speed    (Blank rows = not tested)  mCTSIB: 30s in all 4 conditions, significant sway in condition 4;  BPPV TESTS:  Symptoms Duration Intensity Nystagmus  L Dix-Hallpike None   None  R Dix-Hallpike None   None  L Head Roll Dizziness 1-2s mild Possible 1-2 L torsional beats;  R Head Roll None   None  L Sidelying Test None   None  R Sidelying Test      (blank = not tested)   TODAY'S TREATMENT   SUBJECTIVE: Pt reports that she is doing well today. No changes since the initial evaluation. Denies pain upon arrival. No specific questions or concerns.   PAIN: No pertinent pain;  Neuromuscular Re-education  NuStep L0-2 x 7 minutes for BLE strengthening and warm-up during interval history;   BPPV TESTS:  Symptoms Duration Intensity Nystagmus  L Dix-Hallpike None   None  R Dix-Hallpike None   None  L Head Roll None   None  R Head Roll None   None  L Sidelying Test      R Sidelying Test      (blank = not tested)   FGA:  26/30; MiniBESTest: 23/28; mCTSIB: 30s in all 4 conditions; ABC: 85%; HEP issued and reviewed;   PATIENT EDUCATION:  Education details: Plan of care and HEP; Person educated: Patient Education method: Explanation, Demonstration, and Handouts Education comprehension: verbalized understanding   HOME EXERCISE PROGRAM:  Access Code: [email or web address] URL: [email or web address] Date: 03/22/2023 Prepared by: Selinda Eck  Exercises - Standing Single Leg Stance with Counter Support  - 2 x daily - 7 x weekly - 3 reps - 30s hold - Standing Single Leg Stance with Counter Support (Mirrored)  - 2 x daily - 7 x weekly - 3 reps - 30s hold - Narrow Stance with Eyes Closed and Head Rotation  - 2 x daily - 7 x weekly - 3 reps - 30s hold   ASSESSMENT:  CLINICAL IMPRESSION: Performed additional outcome measures with patient during visit today. She scored 26/30 on the FGA and 23/28 on the MiniBESTest indicating mild balance deficits with higher level tasks. Her balance confidence is above cut-off with pt scoring 85% on the ABC Scale. Issued HEP and reviewed with patient. Plan to progress balance exercises at future sessions. Pt encouraged to follow-up as scheduled. She will benefit from PT services to address deficits in strength, balance, and mobility in order to return to full function at home and decrease his risk for falls.     OBJECTIVE IMPAIRMENTS: decreased balance.   ACTIVITY LIMITATIONS: bathing  PARTICIPATION LIMITATIONS: community activity  PERSONAL FACTORS: Time since onset of injury/illness/exacerbation and 3+ comorbidities: neuropathy, DMII, and anemia  are also affecting patient's functional outcome.   REHAB POTENTIAL: Excellent  CLINICAL DECISION MAKING: Stable/uncomplicated  EVALUATION COMPLEXITY: Low   GOALS: Goals reviewed with patient? No  SHORT TERM GOALS: Target date: 04/12/2023   Pt will be independent with HEP in order to improve strength and balance in  order to decrease fall risk and improve function at home. Baseline:  Goal status: INITIAL  LONG TERM GOALS: Target date: 05/10/2023   Pt will increase FOTO to at least 80 to demonstrate significant improvement in function at home related to balance  Baseline: 79  Goal status: INITIAL  2.  Pt will improve BERG by at least 3 points in order to demonstrate clinically significant improvement in balance.   Baseline: 55/56; Goal status: DISCONTINUED  3.  Pt will improve ABC by at least 13% in order to demonstrate clinically significant improvement in balance confidence.      Baseline: To be completed; 03/22/23: 85% Goal status: DISCONTINUED  4.  Pt will improve miniBESTest by at least 4 points in order to demonstrate clinically significant improvement in balance and decreased risk for falls       Baseline: 03/22/23: 23/28; Goal status: INITIAL  5.  Pt will improve single leg balance to at least 20s on each side in order to demonstrate clinically significant improvement in balance and decreased risk for falls       Baseline: 03/22/23: approximately 10-15s on each side; Goal status: INITIAL   PLAN: PT FREQUENCY: 2x/week  PT DURATION: 8 weeks  PLANNED INTERVENTIONS: Therapeutic exercises, Therapeutic activity, Neuromuscular re-education, Balance training, Gait training, Patient/Family education, Self Care, Joint mobilization, Joint manipulation, Vestibular training, Canalith repositioning, Orthotic/Fit training, DME instructions, Dry Needling, Electrical stimulation, Spinal manipulation, Spinal mobilization, Cryotherapy, Moist heat, Traction, Manual therapy, and Re-evaluation.  PLAN FOR NEXT SESSION: progress balance exercises, review/modify HEP as necessary   Kalisa Girtman D Leslie Langille PT, DPT, GCS  Lavonna Lampron 03/22/2023, 11:01 AM

## 2023-03-22 ENCOUNTER — Ambulatory Visit: Payer: Medicare PPO

## 2023-03-22 DIAGNOSIS — R2681 Unsteadiness on feet: Secondary | ICD-10-CM

## 2023-03-26 NOTE — Therapy (Signed)
OUTPATIENT PHYSICAL THERAPY BALANCE TREATMENT   Patient Name: KEYLIN IMRAN MRN: [id number] DOB:1955/05/17, 68 y.o., female Today's Date: 03/29/2023  END OF SESSION:  PT End of Session - 03/29/23 0925     Visit Number 3    Number of Visits 17    Authorization Type eval: 03/15/23    PT Start Time 0930    PT Stop Time 1015    PT Time Calculation (min) 45 min    Equipment Utilized During Treatment Gait belt    Activity Tolerance Patient tolerated treatment well    Behavior During Therapy WFL for tasks assessed/performed            Past Medical History:  Diagnosis Date   Allergy    Diabetes mellitus without complication (HCC) 2012   Hyperlipidemia    Hypertension    Low back pain, episodic    Numbness of feet    Vitamin D deficiency    Past Surgical History:  Procedure Laterality Date   ABDOMINAL HYSTERECTOMY  2010   APPENDECTOMY  1966   COLONOSCOPY  06-07-13   Dr Lemar Livings   DILATION AND CURETTAGE OF UTERUS     OOPHORECTOMY     Patient Active Problem List   Diagnosis Date Noted   Low serum vitamin B12 05/19/2022   Type 2 diabetes mellitus with microalbuminuria, without long-term current use of insulin (HCC) 05/19/2022   Hypertension associated with diabetes (HCC) 02/12/2022   Iron deficiency anemia 02/14/2018   Chondromalacia, right knee 10/30/2016   Long term current use of anticoagulant therapy 01/05/2016   Factor 5 Leiden mutation, heterozygous (HCC) 05/02/2015   Portal vein thrombosis 11/07/2014   History of sepsis 10/10/2014   Abnormal electrocardiogram 10/08/2014   Nonspecific abnormal electromyogram (EMG) 10/08/2014   Benign essential HTN 10/08/2014   Controlled type 2 diabetes with neuropathy (HCC) 10/08/2014   Dyslipidemia 10/08/2014   Low back pain 10/08/2014   Gastro-esophageal reflux disease without esophagitis 10/08/2014   Microalbuminuria 10/08/2014   Disturbance of skin sensation 10/08/2014   Obesity (BMI 30-39.9) 10/08/2014   Perennial allergic  rhinitis with seasonal variation 10/08/2014   Plantar fasciitis 10/08/2014   Postablative ovarian failure 10/08/2014   Menopausal symptom 10/08/2014   Vitamin D deficiency 10/08/2014    PCP: Alba Cory, MD  REFERRING PROVIDER: Lonell Face, MD   REFERRING DIAG: E11.42 (ICD-10-CM) - Diabetic peripheral neuropathy (HCC)  RATIONALE FOR EVALUATION AND TREATMENT: Rehabilitation  THERAPY DIAG: Unsteadiness on feet  ONSET DATE: "several months"  FOLLOW-UP APPT SCHEDULED WITH REFERRING PROVIDER: Yes   FROM INITIAL EVALUATION SUBJECTIVE:  SUBJECTIVE STATEMENT:  Unsteadiness  PERTINENT HISTORY:  Pt referred to PT by neurology for imbalance. She has a history of diabetic peripheral neuropathy and B12 deficiency. She complains imbalance for the last several months particularly with eyes closed/darkeness such as when showering or getting up at night to go to the bathroom. Pt reports drifting when walking. She has a distant history of BPPV improved with the Epley maneuver. Pt reports that she stil occasionally feels "swimmy headed" in the morning. MRI Brain unremarkable and pt did not meet criteria for CPAP during sleep study. She has factor V leiden mutation and is on Xarelto for portal vein thrombosis. She saw hematologist but released from their care in 2022.  09/01/2022 NCS upper extremities -  This is an abnormal electrodiagnostic exam consistent with 1) Right mild (grade II) carpal tunnel syndrome (median nerve entrapment at wrist).   08/26/2022 NCS lower extremities -  This is an abnormal electrodiagnostic study consistent with a generalized severe motor polyneuropathy, this study may over estimate degree of polyneuropathy due to edema and adipose tissue.  Brain MRI 10/27/22: IMPRESSION: Negative brain  MRI.  No infarct or other explanation for symptoms.    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 No,   Living Environment Lives with: lives alone Lives in: Mobile home Stairs: 5 steps, bilateral narrow hand rails Has following equipment at home: Single point cane, occasionally uses when walking longer distances  Prior level of function: Independent  Occupational demands: Retired from Audiological scientist work  Hobbies: Running errands, spending time with extended family  Patient Goals: Primary concern is imbalance while in the shower;   OBJECTIVE:   Patient Surveys  FOTO: 62, predicted improvement to 10; ABC: To be completed  Cognition Patient is oriented to person, place, and time.  Recent memory is intact.  Remote memory is intact.  Attention span and concentration are intact.  Expressive speech is intact.  Patient's fund of knowledge is within normal limits for educational level.    Gross Musculoskeletal Assessment Tremor: None Bulk: Normal Tone: Normal  Posture: No gross abnormalities noted in standing or seated posture  AROM No gross functional deficits identified;  LE MMT: MMT (out of 5) Right  Left   Hip flexion 4 4  Hip extension    Hip abduction (seated) 4 4  Hip adduction (seated) 4 4  Hip internal rotation    Hip external rotation    Knee flexion (seated) 5 5  Knee extension 5 5  Ankle dorsiflexion 4+ 4+  (* =  pain; Blank rows = not tested)  Sensation Stocking distribution neuropathy bilateral feet up to ankles;  Reflexes Deferred  Cranial Nerves Deferred  Coordination/Cerebellar Deferred  Bed mobility: Deferred  Transfers: Assistive device utilized: None  Sit to stand: Complete Independence Stand to sit: Complete Independence Chair to chair: Complete Independence Floor:  Deferred  Curb:  Deferred  Stairs: Level of Assistance: Complete Independence Stair Negotiation Technique: Alternating Pattern  with No Rails Number of Stairs: 4  Height of Stairs: 6"  Comments:  No deficits identified  Gait: Gait pattern: WFL Distance walked: 100' Assistive device utilized: None Level of assistance: Complete Independence Comments: No significant deficits identified. No evidence of foot drop;  Functional Outcome Measures  Results 03/21/22 Comments  BERG 55/56    MiniBEST  23/28   FGA  26/30   ABC  85%   TUG 8.8 seconds    5TSTS 12.2 seconds    6 Minute Walk Test     10 Meter Gait Speed     (Blank rows = not tested)  mCTSIB: 30s in all 4 conditions, significant sway in condition 4;  BPPV TESTS:  Symptoms Duration Intensity Nystagmus  L Dix-Hallpike None   None  R Dix-Hallpike None   None  L Head Roll Dizziness 1-2s mild Possible 1-2 L torsional beats;  R Head Roll None   None  L Sidelying Test None   None  R Sidelying Test      (blank = not tested)   TODAY'S TREATMENT    SUBJECTIVE: Pt reports that she is doing well today. No changes since the last therapy session. Reports 3/10 chronic bilateral low back pain upon arrival today. No specific questions upon arrival.   PAIN: 3/10 bilateral low back pain   Neuromuscular Re-education  NuStep L0-2 x 5 minutes for BLE strengthening and warm-up during interval history; Rockerboard A/P static balance without UE support x 60s; Rockerboard A/P weight shifts without UE support x 60s; Rockerboard A/P horizontal and vertical  head turns without UE support x 60s each; Airex tandem balance without UE support x 60s with each foot forward eyes open/closed; Airex tandem balance without UE support and vertical/horizontal head turns x 60s with each foot forward; Airex balance beam tandem gait without UE support x multiple lengths; Airex balance beam side stepping without UE support x multiple lengths; Forefeet on ramp with eyes closed balance x 60s; 6" cone taps in 1, 2, and 3 cone tapping patterns with therapist calling out laterality and sequence x multiple trials on each side; Cross-over steps in // bars without UE support x 2 lengths; Braiding steps in // bars without UE support x 4 lengths; Updated HEP and reviewed with patient;   PATIENT EDUCATION:  Education details: Plan of care and HEP; Person educated: Patient Education method: Explanation, Facilities manager, and Handouts Education comprehension: verbalized understanding   HOME EXERCISE PROGRAM:  Access Code: [email or web address] URL: [email or web address] Date: 03/29/2023 Prepared by: Ria Comment  Exercises - Standing Single Leg Stance with Counter Support  - 2 x daily - 7 x weekly - 3 reps - 30s hold - Standing Single Leg Stance with Counter Support (Mirrored)  - 2 x daily - 7 x weekly - 3 reps - 30s hold - Narrow Stance with Eyes Closed and Head Rotation  - 2 x daily - 7 x weekly - 3 reps - 30s hold - Tandem Stance with Head Rotation  - 2 x daily - 7 x weekly - 3 reps - 30s hold - Tandem Stance with Head Rotation (Mirrored)  - 2 x daily - 7 x weekly - 3 reps - 30s hold   ASSESSMENT:  CLINICAL IMPRESSION: Progressed balance exercises during session today. She struggles with high level balance activities such as rockerboard with head turns and Airex tandem balance. Updated HEP and reviewed with patient. Plan to progress balance exercises at future sessions. Pt encouraged to follow-up as scheduled. She will benefit from PT services to address  deficits in strength, balance, and mobility in order  to return to full function at home and decrease her risk for falls.     OBJECTIVE IMPAIRMENTS: decreased balance.   ACTIVITY LIMITATIONS: bathing  PARTICIPATION LIMITATIONS: community activity  PERSONAL FACTORS: Time since onset of injury/illness/exacerbation and 3+ comorbidities: neuropathy, DMII, and anemia  are also affecting patient's functional outcome.   REHAB POTENTIAL: Excellent  CLINICAL DECISION MAKING: Stable/uncomplicated  EVALUATION COMPLEXITY: Low   GOALS: Goals reviewed with patient? No  SHORT TERM GOALS: Target date: 04/12/2023   Pt will be independent with HEP in order to improve strength and balance in order to decrease fall risk and improve function at home. Baseline:  Goal status: INITIAL   LONG TERM GOALS: Target date: 05/10/2023   Pt will increase FOTO to at least 80 to demonstrate significant improvement in function at home related to balance  Baseline: 79  Goal status: INITIAL  2.  Pt will improve BERG by at least 3 points in order to demonstrate clinically significant improvement in balance.   Baseline: 55/56; Goal status: DISCONTINUED  3.  Pt will improve ABC by at least 13% in order to demonstrate clinically significant improvement in balance confidence.      Baseline: To be completed; 03/22/23: 85% Goal status: DISCONTINUED  4.  Pt will improve miniBESTest by at least 4 points in order to demonstrate clinically significant improvement in balance and decreased risk for falls       Baseline: 03/22/23: 23/28; Goal status: INITIAL  5.  Pt will improve single leg balance to at least 20s on each side in order to demonstrate clinically significant improvement in balance and decreased risk for falls       Baseline: 03/22/23: approximately 10-15s on each side; Goal status: INITIAL   PLAN: PT FREQUENCY: 2x/week  PT DURATION: 8 weeks  PLANNED INTERVENTIONS: Therapeutic exercises, Therapeutic  activity, Neuromuscular re-education, Balance training, Gait training, Patient/Family education, Self Care, Joint mobilization, Joint manipulation, Vestibular training, Canalith repositioning, Orthotic/Fit training, DME instructions, Dry Needling, Electrical stimulation, Spinal manipulation, Spinal mobilization, Cryotherapy, Moist heat, Traction, Manual therapy, and Re-evaluation.  PLAN FOR NEXT SESSION: progress balance exercises, review/modify HEP as necessary   Sharalyn Ink Delmon Andrada PT, DPT, GCS  Camisha Srey 03/29/2023, 10:19 AM

## 2023-03-29 ENCOUNTER — Ambulatory Visit: Payer: Medicare PPO

## 2023-03-29 DIAGNOSIS — R2681 Unsteadiness on feet: Secondary | ICD-10-CM

## 2023-04-03 ENCOUNTER — Other Ambulatory Visit: Payer: Self-pay | Admitting: Family Medicine

## 2023-04-03 DIAGNOSIS — J302 Other seasonal allergic rhinitis: Secondary | ICD-10-CM

## 2023-04-05 ENCOUNTER — Ambulatory Visit: Payer: Medicare PPO

## 2023-04-05 DIAGNOSIS — R2681 Unsteadiness on feet: Secondary | ICD-10-CM | POA: Diagnosis not present

## 2023-04-05 NOTE — Therapy (Signed)
OUTPATIENT PHYSICAL THERAPY BALANCE TREATMENT  Patient Name: Emily Pitts MRN: [id number] DOB:1956/01/03, 68 y.o., female Today's Date: 04/05/2023  END OF SESSION:  PT End of Session - 04/05/23 0927     Visit Number 4    Number of Visits 17    Authorization Type eval: 03/15/23    PT Start Time 0930    PT Stop Time 1015    PT Time Calculation (min) 45 min    Equipment Utilized During Treatment Gait belt    Activity Tolerance Patient tolerated treatment well    Behavior During Therapy WFL for tasks assessed/performed            Past Medical History:  Diagnosis Date   Allergy    Diabetes mellitus without complication (HCC) 2012   Hyperlipidemia    Hypertension    Low back pain, episodic    Numbness of feet    Vitamin D deficiency    Past Surgical History:  Procedure Laterality Date   ABDOMINAL HYSTERECTOMY  2010   APPENDECTOMY  1966   COLONOSCOPY  06-07-13   Dr Lemar Livings   DILATION AND CURETTAGE OF UTERUS     OOPHORECTOMY     Patient Active Problem List   Diagnosis Date Noted   Low serum vitamin B12 05/19/2022   Type 2 diabetes mellitus with microalbuminuria, without long-term current use of insulin (HCC) 05/19/2022   Hypertension associated with diabetes (HCC) 02/12/2022   Iron deficiency anemia 02/14/2018   Chondromalacia, right knee 10/30/2016   Long term current use of anticoagulant therapy 01/05/2016   Factor 5 Leiden mutation, heterozygous (HCC) 05/02/2015   Portal vein thrombosis 11/07/2014   History of sepsis 10/10/2014   Abnormal electrocardiogram 10/08/2014   Nonspecific abnormal electromyogram (EMG) 10/08/2014   Benign essential HTN 10/08/2014   Controlled type 2 diabetes with neuropathy (HCC) 10/08/2014   Dyslipidemia 10/08/2014   Low back pain 10/08/2014   Gastro-esophageal reflux disease without esophagitis 10/08/2014   Microalbuminuria 10/08/2014   Disturbance of skin sensation 10/08/2014   Obesity (BMI 30-39.9) 10/08/2014   Perennial allergic  rhinitis with seasonal variation 10/08/2014   Plantar fasciitis 10/08/2014   Postablative ovarian failure 10/08/2014   Menopausal symptom 10/08/2014   Vitamin D deficiency 10/08/2014    PCP: Alba Cory, MD  REFERRING PROVIDER: Lonell Face, MD   REFERRING DIAG: E11.42 (ICD-10-CM) - Diabetic peripheral neuropathy (HCC)  RATIONALE FOR EVALUATION AND TREATMENT: Rehabilitation  THERAPY DIAG: Unsteadiness on feet  ONSET DATE: "several months"  FOLLOW-UP APPT SCHEDULED WITH REFERRING PROVIDER: Yes   FROM INITIAL EVALUATION SUBJECTIVE:  SUBJECTIVE STATEMENT:  Unsteadiness  PERTINENT HISTORY:  Pt referred to PT by neurology for imbalance. She has a history of diabetic peripheral neuropathy and B12 deficiency. She complains imbalance for the last several months particularly with eyes closed/darkeness such as when showering or getting up at night to go to the bathroom. Pt reports drifting when walking. She has a distant history of BPPV improved with the Epley maneuver. Pt reports that she stil occasionally feels "swimmy headed" in the morning. MRI Brain unremarkable and pt did not meet criteria for CPAP during sleep study. She has factor V leiden mutation and is on Xarelto for portal vein thrombosis. She saw hematologist but released from their care in 2022.  09/01/2022 NCS upper extremities -  This is an abnormal electrodiagnostic exam consistent with 1) Right mild (grade II) carpal tunnel syndrome (median nerve entrapment at wrist).   08/26/2022 NCS lower extremities -  This is an abnormal electrodiagnostic study consistent with a generalized severe motor polyneuropathy, this study may over estimate degree of polyneuropathy due to edema and adipose tissue.  Brain MRI 10/27/22: IMPRESSION: Negative brain  MRI.  No infarct or other explanation for symptoms.    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 No,   Living Environment Lives with: lives alone Lives in: Mobile home Stairs: 5 steps, bilateral narrow hand rails Has following equipment at home: Single point cane, occasionally uses when walking longer distances  Prior level of function: Independent  Occupational demands: Retired from Audiological scientist work  Hobbies: Running errands, spending time with extended family  Patient Goals: Primary concern is imbalance while in the shower;   OBJECTIVE:   Patient Surveys  FOTO: 71, predicted improvement to 54; ABC: To be completed  Cognition Patient is oriented to person, place, and time.  Recent memory is intact.  Remote memory is intact.  Attention span and concentration are intact.  Expressive speech is intact.  Patient's fund of knowledge is within normal limits for educational level.    Gross Musculoskeletal Assessment Tremor: None Bulk: Normal Tone: Normal  Posture: No gross abnormalities noted in standing or seated posture  AROM No gross functional deficits identified;  LE MMT: MMT (out of 5) Right  Left   Hip flexion 4 4  Hip extension    Hip abduction (seated) 4 4  Hip adduction (seated) 4 4  Hip internal rotation    Hip external rotation    Knee flexion (seated) 5 5  Knee extension 5 5  Ankle dorsiflexion 4+ 4+  (* =  pain; Blank rows = not tested)  Sensation Stocking distribution neuropathy bilateral feet up to ankles;  Reflexes Deferred  Cranial Nerves Deferred  Coordination/Cerebellar Deferred  Bed mobility: Deferred  Transfers: Assistive device utilized: None  Sit to stand: Complete Independence Stand to sit: Complete Independence Chair to chair: Complete Independence Floor:  Deferred  Curb:  Deferred  Stairs: Level of Assistance: Complete Independence Stair Negotiation Technique: Alternating Pattern  with No Rails Number of Stairs: 4  Height of Stairs: 6"  Comments:  No deficits identified  Gait: Gait pattern: WFL Distance walked: 100' Assistive device utilized: None Level of assistance: Complete Independence Comments: No significant deficits identified. No evidence of foot drop;  Functional Outcome Measures  Results 03/21/22 Comments  BERG 55/56    MiniBEST  23/28   FGA  26/30   ABC  85%   TUG 8.8 seconds    5TSTS 12.2 seconds    6 Minute Walk Test     10 Meter Gait Speed     (Blank rows = not tested)  mCTSIB: 30s in all 4 conditions, significant sway in condition 4;  BPPV TESTS:  Symptoms Duration Intensity Nystagmus  L Dix-Hallpike None   None  R Dix-Hallpike None   None  L Head Roll Dizziness 1-2s mild Possible 1-2 L torsional beats;  R Head Roll None   None  L Sidelying Test None   None  R Sidelying Test      (blank = not tested)   TODAY'S TREATMENT    SUBJECTIVE: Pt reports that she is doing well today. No changes since the last therapy session. No back pain upon arrival today. HEP going well. No specific questions upon arrival.   PAIN: Denies   Neuromuscular Re-education  NuStep L0-2 x 5 minutes for BLE strengthening and warm-up during interval history; Airex alternating 12" step taps x 10 BLE; Rockerboard A/P and R/L static balance without UE support eyes open/closed x 60s each; Rockerboard A/P and R/L weight shifts without UE support x  60s; Rockerboard A/P and R/L horizontal and vertical head turns without UE support x 60s each; 1/2 foam roll flat side up tandem balance alternating forward LE x 60s each; 1/2 foam roll flat side up AP balance x 60s; BOSU balance tosses in // bars without UE support 2 x 60s;   Not performed: Airex tandem balance without UE support x 60s with each foot forward eyes open/closed; Airex tandem balance without UE support and vertical/horizontal head turns x 60s with each foot forward; Airex balance beam tandem gait without UE support x multiple lengths; Airex balance beam side stepping without UE support x multiple lengths; Forefeet on ramp with eyes closed balance x 60s; 6" cone taps in 1, 2, and 3 cone tapping patterns with therapist calling out laterality and sequence x multiple trials on each side; Cross-over steps in // bars without UE support x 2 lengths; Braiding steps in // bars without UE support x 4 lengths;   PATIENT EDUCATION:  Education details: Plan of care and HEP; Person educated: Patient Education method: Explanation, Demonstration, and Handouts Education comprehension: verbalized understanding   HOME EXERCISE PROGRAM:  Access Code: [email or web address] URL: [email or web address] Date: 03/29/2023 Prepared by: Ria Comment  Exercises - Standing Single Leg Stance with Counter Support  - 2 x daily - 7 x weekly - 3 reps - 30s hold - Standing Single Leg Stance with Counter Support (Mirrored)  - 2 x daily - 7 x weekly - 3 reps - 30s hold - Narrow Stance with Eyes Closed and Head Rotation  - 2 x daily - 7 x weekly - 3 reps - 30s hold - Tandem Stance with Head Rotation  - 2 x daily - 7 x weekly - 3 reps - 30s hold - Tandem Stance with Head Rotation (Mirrored)  - 2 x daily - 7 x weekly - 3 reps - 30s hold   ASSESSMENT:  CLINICAL IMPRESSION: Progressed balance exercises during session today. She struggles with high level balance activities such  as rockerboard with head  turns and BOSU ball tosses. No HEP updates at this time. Plan to progress balance exercises at future sessions. Pt encouraged to follow-up as scheduled. She will benefit from PT services to address deficits in strength, balance, and mobility in order to return to full function at home and decrease her risk for falls.     OBJECTIVE IMPAIRMENTS: decreased balance.   ACTIVITY LIMITATIONS: bathing  PARTICIPATION LIMITATIONS: community activity  PERSONAL FACTORS: Time since onset of injury/illness/exacerbation and 3+ comorbidities: neuropathy, DMII, and anemia  are also affecting patient's functional outcome.   REHAB POTENTIAL: Excellent  CLINICAL DECISION MAKING: Stable/uncomplicated  EVALUATION COMPLEXITY: Low   GOALS: Goals reviewed with patient? No  SHORT TERM GOALS: Target date: 04/12/2023   Pt will be independent with HEP in order to improve strength and balance in order to decrease fall risk and improve function at home. Baseline:  Goal status: INITIAL   LONG TERM GOALS: Target date: 05/10/2023   Pt will increase FOTO to at least 80 to demonstrate significant improvement in function at home related to balance  Baseline: 79  Goal status: INITIAL  2.  Pt will improve BERG by at least 3 points in order to demonstrate clinically significant improvement in balance.   Baseline: 55/56; Goal status: DISCONTINUED  3.  Pt will improve ABC by at least 13% in order to demonstrate clinically significant improvement in balance confidence.      Baseline: To be completed; 03/22/23: 85% Goal status: DISCONTINUED  4.  Pt will improve miniBESTest by at least 4 points in order to demonstrate clinically significant improvement in balance and decreased risk for falls       Baseline: 03/22/23: 23/28; Goal status: INITIAL  5.  Pt will improve single leg balance to at least 20s on each side in order to demonstrate clinically significant improvement in balance and decreased risk for falls        Baseline: 03/22/23: approximately 10-15s on each side; Goal status: INITIAL   PLAN: PT FREQUENCY: 2x/week  PT DURATION: 8 weeks  PLANNED INTERVENTIONS: Therapeutic exercises, Therapeutic activity, Neuromuscular re-education, Balance training, Gait training, Patient/Family education, Self Care, Joint mobilization, Joint manipulation, Vestibular training, Canalith repositioning, Orthotic/Fit training, DME instructions, Dry Needling, Electrical stimulation, Spinal manipulation, Spinal mobilization, Cryotherapy, Moist heat, Traction, Manual therapy, and Re-evaluation.  PLAN FOR NEXT SESSION: progress balance exercises, review/modify HEP as necessary   Sharalyn Ink Kenaz Olafson PT, DPT, GCS  Sofia Vanmeter 04/05/2023, 1:16 PM

## 2023-04-08 ENCOUNTER — Other Ambulatory Visit: Payer: Self-pay | Admitting: Family Medicine

## 2023-04-12 ENCOUNTER — Ambulatory Visit: Payer: Medicare PPO | Attending: Neurology

## 2023-04-12 DIAGNOSIS — R2681 Unsteadiness on feet: Secondary | ICD-10-CM | POA: Diagnosis not present

## 2023-04-12 NOTE — Therapy (Signed)
OUTPATIENT PHYSICAL THERAPY BALANCE TREATMENT  Patient Name: Emily Pitts MRN: [id number] DOB:10-02-1955, 68 y.o., female Today's Date: 04/12/2023  END OF SESSION:  PT End of Session - 04/12/23 0936     Visit Number 5    Number of Visits 17    Authorization Type eval: 03/15/23    PT Start Time 0930    PT Stop Time 1015    PT Time Calculation (min) 45 min    Equipment Utilized During Treatment Gait belt    Activity Tolerance Patient tolerated treatment well    Behavior During Therapy WFL for tasks assessed/performed            Past Medical History:  Diagnosis Date   Allergy    Diabetes mellitus without complication (HCC) 2012   Hyperlipidemia    Hypertension    Low back pain, episodic    Numbness of feet    Vitamin D deficiency    Past Surgical History:  Procedure Laterality Date   ABDOMINAL HYSTERECTOMY  2010   APPENDECTOMY  1966   COLONOSCOPY  06-07-13   Dr Lemar Livings   DILATION AND CURETTAGE OF UTERUS     OOPHORECTOMY     Patient Active Problem List   Diagnosis Date Noted   Low serum vitamin B12 05/19/2022   Type 2 diabetes mellitus with microalbuminuria, without long-term current use of insulin (HCC) 05/19/2022   Hypertension associated with diabetes (HCC) 02/12/2022   Iron deficiency anemia 02/14/2018   Chondromalacia, right knee 10/30/2016   Long term current use of anticoagulant therapy 01/05/2016   Factor 5 Leiden mutation, heterozygous (HCC) 05/02/2015   Portal vein thrombosis 11/07/2014   History of sepsis 10/10/2014   Abnormal electrocardiogram 10/08/2014   Nonspecific abnormal electromyogram (EMG) 10/08/2014   Benign essential HTN 10/08/2014   Controlled type 2 diabetes with neuropathy (HCC) 10/08/2014   Dyslipidemia 10/08/2014   Low back pain 10/08/2014   Gastro-esophageal reflux disease without esophagitis 10/08/2014   Microalbuminuria 10/08/2014   Disturbance of skin sensation 10/08/2014   Obesity (BMI 30-39.9) 10/08/2014   Perennial allergic  rhinitis with seasonal variation 10/08/2014   Plantar fasciitis 10/08/2014   Postablative ovarian failure 10/08/2014   Menopausal symptom 10/08/2014   Vitamin D deficiency 10/08/2014    PCP: Alba Cory, MD  REFERRING PROVIDER: Lonell Face, MD   REFERRING DIAG: E11.42 (ICD-10-CM) - Diabetic peripheral neuropathy (HCC)  RATIONALE FOR EVALUATION AND TREATMENT: Rehabilitation  THERAPY DIAG: Unsteadiness on feet  ONSET DATE: "several months"  FOLLOW-UP APPT SCHEDULED WITH REFERRING PROVIDER: Yes   FROM INITIAL EVALUATION SUBJECTIVE:  SUBJECTIVE STATEMENT:  Unsteadiness  PERTINENT HISTORY:  Pt referred to PT by neurology for imbalance. She has a history of diabetic peripheral neuropathy and B12 deficiency. She complains imbalance for the last several months particularly with eyes closed/darkeness such as when showering or getting up at night to go to the bathroom. Pt reports drifting when walking. She has a distant history of BPPV improved with the Epley maneuver. Pt reports that she stil occasionally feels "swimmy headed" in the morning. MRI Brain unremarkable and pt did not meet criteria for CPAP during sleep study. She has factor V leiden mutation and is on Xarelto for portal vein thrombosis. She saw hematologist but released from their care in 2022.  09/01/2022 [facility] upper extremities -  This is an abnormal electrodiagnostic exam consistent with 1) Right mild (grade II) carpal tunnel syndrome (median nerve entrapment at wrist).   08/26/2022 [facility] lower extremities -  This is an abnormal electrodiagnostic study consistent with a generalized severe motor polyneuropathy, this study may over estimate degree of polyneuropathy due to edema and adipose tissue.  Brain MRI 10/27/22: IMPRESSION: Negative brain  MRI.  No infarct or other explanation for symptoms.    Pain: Yes, R knee pain, receives cortisone injections, history of chronic low back pain but not problematic recently Numbness/Tingling: Yes, history of BLE stocking distribution neuropathy to the ankles; Focal Weakness: No Recent changes in overall health/medication: Yes, recently started [facility]; Prior history of physical therapy for balance:  No Dominant hand: right Imaging: Yes, see history; Red flags: Denies personal history of cancer, chills/fever, night sweats, nausea, vomiting,   PRECAUTIONS: None  WEIGHT BEARING RESTRICTIONS: No  FALLS: Has patient fallen in last 6 months? No,   Living Environment Lives with: lives alone Lives in: Mobile home Stairs: 5 steps, bilateral narrow hand rails Has following equipment at home: Single point cane, occasionally uses when walking longer distances  Prior level of function: Independent  Occupational demands: Retired from Audiological scientist work  Hobbies: Running errands, spending time with extended family  Patient Goals: Primary concern is imbalance while in the shower;   OBJECTIVE:   Patient Surveys  FOTO: 52, predicted improvement to 67; ABC: To be completed  Cognition Patient is oriented to person, place, and time.  Recent memory is intact.  Remote memory is intact.  Attention span and concentration are intact.  Expressive speech is intact.  Patient's fund of knowledge is within normal limits for educational level.    Gross Musculoskeletal Assessment Tremor: None Bulk: Normal Tone: Normal  Posture: No gross abnormalities noted in standing or seated posture  AROM No gross functional deficits identified;  LE MMT: MMT (out of 5) Right  Left   Hip flexion 4 4  Hip extension    Hip abduction (seated) 4 4  Hip adduction (seated) 4 4  Hip internal rotation    Hip external rotation    Knee flexion (seated) 5 5  Knee extension 5 5  Ankle dorsiflexion 4+ 4+  (* =  pain; Blank rows = not tested)  Sensation Stocking distribution neuropathy bilateral feet up to ankles;  Reflexes Deferred  Cranial Nerves Deferred  Coordination/Cerebellar Deferred  Bed mobility: Deferred  Transfers: Assistive device utilized: None  Sit to stand: Complete Independence Stand to sit: Complete Independence Chair to chair: Complete Independence Floor:  Deferred  Curb:  Deferred  Stairs: Level of Assistance: Complete Independence Stair Negotiation Technique: Alternating Pattern  with No Rails Number of Stairs: 4  Height of Stairs: 6"  Comments:  No deficits identified  Gait: Gait pattern: WFL Distance walked: 100' Assistive device utilized: None Level of assistance: Complete Independence Comments: No significant deficits identified. No evidence of foot drop;  Functional Outcome Measures  Results 03/21/22 Comments  BERG 55/56    MiniBEST  23/28   FGA  26/30   ABC  85%   TUG 8.8 seconds    5TSTS 12.2 seconds    6 Minute Walk Test     10 Meter Gait Speed     (Blank rows = not tested)  mCTSIB: 30s in all 4 conditions, significant sway in condition 4;  BPPV TESTS:  Symptoms Duration Intensity Nystagmus  L Dix-Hallpike None   None  R Dix-Hallpike None   None  L Head Roll Dizziness 1-2s mild Possible 1-2 L torsional beats;  R Head Roll None   None  L Sidelying Test None   None  R Sidelying Test      (blank = not tested)   TODAY'S TREATMENT    SUBJECTIVE: Pt reports that she is doing well today. No changes since the last therapy session. No back pain upon arrival today. HEP going well. No specific questions upon arrival.   PAIN: Denies   Neuromuscular Re-education  NuStep L0-2 x 7 minutes for BLE strengthening and warm-up during interval history; Airex alternating 12" step taps x 10 BLE; Airex FT balance without UE support eyes open/closed x 30s each; Airex FT balance without UE support with horizontal/vertical head turns x 30s  each; Airex tandem balance without UE support x 60s with each foot forward eyes open/closed; Airex tandem balance without UE support and vertical/horizontal head turns x 30s with each foot forward; Rockerboard A/P and R/L static balance without UE support eyes open/closed x 60s each; Rockerboard A/P and R/L weight shifts without UE support x 60s; BOSU balance ball chops in // bars without UE support 2 x 45s; Blaze pods toe touch on BOSU, 3 pods on 12" step with focus pattern (2 distracting) 4 x 60s;    Not performed: 1/2 foam roll flat side up tandem balance alternating forward LE x 60s each; 1/2 foam roll flat side up AP balance x 60s; BOSU balance tosses in // bars without UE support 2 x 60s; Rockerboard A/P and R/L horizontal and vertical head turns without UE support x 60s each; Airex balance beam tandem gait without UE support x multiple lengths; Airex balance beam side stepping without UE support x multiple lengths; Forefeet on ramp with eyes closed balance x 60s; 6" cone taps in 1, 2, and 3 cone tapping patterns with therapist calling out laterality and sequence x multiple trials on each side; Cross-over steps in // bars without UE support x 2 lengths; Braiding steps in // bars without UE support x 4 lengths;   PATIENT EDUCATION:  Education details: Plan of care and HEP; Person educated: Patient Education method: Explanation, Demonstration, and Handouts Education comprehension: verbalized understanding   HOME EXERCISE PROGRAM:  Access Code: [email or web address] URL: [email or web address] Date: 03/29/2023 Prepared by: Ria Comment  Exercises - Standing Single Leg Stance with Counter Support  - 2 x daily - 7 x weekly - 3 reps - 30s hold - Standing Single Leg Stance with Counter Support (Mirrored)  - 2 x daily - 7 x weekly - 3 reps - 30s hold - Narrow Stance with Eyes Closed and Head Rotation  - 2 x daily - 7 x weekly - 3 reps - 30s hold - Tandem Stance with Head  Rotation  - 2 x daily - 7 x weekly - 3 reps - 30s hold - Tandem Stance with Head Rotation (Mirrored)  - 2 x daily - 7 x weekly - 3 reps - 30s hold   ASSESSMENT:  CLINICAL IMPRESSION: Progressed balance exercises during session today. The patient adapted well to [facility] during the initial introduction, showing improved motor skills and enthusiasm. No HEP updates at this time. Pt encouraged to follow-up as scheduled. She will benefit from PT services to address deficits in strength, balance, and mobility in order to return to full function at home and decrease her risk for falls.     OBJECTIVE IMPAIRMENTS: decreased balance.   ACTIVITY LIMITATIONS: bathing  PARTICIPATION LIMITATIONS: community activity  PERSONAL FACTORS: Time since onset of injury/illness/exacerbation and 3+ comorbidities: neuropathy, DMII, and anemia  are also affecting patient's functional outcome.   REHAB POTENTIAL: Excellent  CLINICAL DECISION MAKING: Stable/uncomplicated  EVALUATION COMPLEXITY: Low   GOALS: Goals reviewed with patient? No  SHORT TERM GOALS: Target date: 04/12/2023   Pt will be independent with HEP in order to improve strength and balance in order to decrease fall risk and improve function at home. Baseline:  Goal status: INITIAL   LONG TERM GOALS: Target date: 05/10/2023   Pt will increase FOTO to at least 80 to demonstrate significant improvement in function at home related to balance  Baseline: 79  Goal status: INITIAL  2.  Pt will improve BERG by at least 3 points in order to demonstrate clinically significant improvement in balance.   Baseline: 55/56; Goal status: DISCONTINUED  3.  Pt will improve ABC by at least 13% in order to demonstrate clinically significant improvement in balance confidence.      Baseline: To be completed; 03/22/23: 85% Goal status: DISCONTINUED  4.  Pt will improve miniBESTest by at least 4 points in order to demonstrate clinically significant improvement  in balance and decreased risk for falls       Baseline: 03/22/23: 23/28; Goal status: INITIAL  5.  Pt will improve single leg balance to at least 20s on each side in order to demonstrate clinically significant improvement in balance and decreased risk for falls       Baseline: 03/22/23: approximately 10-15s on each side; Goal status: INITIAL   PLAN: PT FREQUENCY: 2x/week  PT DURATION: 8 weeks  PLANNED INTERVENTIONS: Therapeutic exercises, Therapeutic activity, Neuromuscular re-education, Balance training, Gait training, Patient/Family education, Self Care, Joint mobilization, Joint manipulation, Vestibular training, Canalith repositioning, Orthotic/Fit training, DME instructions, Dry Needling, Electrical stimulation, Spinal manipulation, Spinal mobilization, Cryotherapy, Moist heat, Traction, Manual therapy, and Re-evaluation.  PLAN FOR NEXT SESSION: progress balance exercises, review/modify HEP as necessary  Sherri Sear, SPT [address]   Sharalyn Ink Huprich PT, DPT, GCS  Huprich,Jason 04/12/2023, 1:54 PM

## 2023-04-19 ENCOUNTER — Ambulatory Visit: Payer: Medicare PPO

## 2023-04-19 DIAGNOSIS — R2681 Unsteadiness on feet: Secondary | ICD-10-CM | POA: Diagnosis not present

## 2023-04-19 NOTE — Therapy (Signed)
 OUTPATIENT PHYSICAL THERAPY BALANCE TREATMENT  Patient Name: Emily Pitts MRN: [id number] DOB:05-05-1955, 68 y.o., female Today's Date: 04/19/2023  END OF SESSION:  PT End of Session - 04/19/23 1316     Visit Number 6    Number of Visits 17    Authorization Type eval: 03/15/23    PT Start Time 1315    PT Stop Time 1400    PT Time Calculation (min) 45 min    Equipment Utilized During Treatment Gait belt    Activity Tolerance Patient tolerated treatment well    Behavior During Therapy WFL for tasks assessed/performed            Past Medical History:  Diagnosis Date   Allergy    Diabetes mellitus without complication (HCC) 2012   Hyperlipidemia    Hypertension    Low back pain, episodic    Numbness of feet    Vitamin D  deficiency    Past Surgical History:  Procedure Laterality Date   ABDOMINAL HYSTERECTOMY  2010   APPENDECTOMY  1966   COLONOSCOPY  06-07-13   Dr Marquita Situ   DILATION AND CURETTAGE OF UTERUS     OOPHORECTOMY     Patient Active Problem List   Diagnosis Date Noted   Low serum vitamin B12 05/19/2022   Type 2 diabetes mellitus with microalbuminuria, without long-term current use of insulin  (HCC) 05/19/2022   Hypertension associated with diabetes (HCC) 02/12/2022   Iron  deficiency anemia 02/14/2018   Chondromalacia, right knee 10/30/2016   Long term current use of anticoagulant therapy 01/05/2016   Factor 5 Leiden mutation, heterozygous (HCC) 05/02/2015   Portal vein thrombosis 11/07/2014   History of sepsis 10/10/2014   Abnormal electrocardiogram 10/08/2014   Nonspecific abnormal electromyogram (EMG) 10/08/2014   Benign essential HTN 10/08/2014   Controlled type 2 diabetes with neuropathy (HCC) 10/08/2014   Dyslipidemia 10/08/2014   Low back pain 10/08/2014   Gastro-esophageal reflux disease without esophagitis 10/08/2014   Microalbuminuria 10/08/2014   Disturbance of skin sensation 10/08/2014   Obesity (BMI 30-39.9) 10/08/2014   Perennial allergic  rhinitis with seasonal variation 10/08/2014   Plantar fasciitis 10/08/2014   Postablative ovarian failure 10/08/2014   Menopausal symptom 10/08/2014   Vitamin D  deficiency 10/08/2014    PCP: Sowles, Krichna, MD  REFERRING PROVIDER: Rosan Comfort, MD   REFERRING DIAG: E11.42 (ICD-10-CM) - Diabetic peripheral neuropathy (HCC)  RATIONALE FOR EVALUATION AND TREATMENT: Rehabilitation  THERAPY DIAG: Unsteadiness on feet  ONSET DATE: "several months"  FOLLOW-UP APPT SCHEDULED WITH REFERRING PROVIDER: Yes   FROM INITIAL EVALUATION SUBJECTIVE:  SUBJECTIVE STATEMENT:  Unsteadiness  PERTINENT HISTORY:  Pt referred to PT by neurology for imbalance. She has a history of diabetic peripheral neuropathy and B12 deficiency. She complains imbalance for the last several months particularly with eyes closed/darkeness such as when showering or getting up at night to go to the bathroom. Pt reports drifting when walking. She has a distant history of BPPV improved with the Epley maneuver. Pt reports that she stil occasionally feels "swimmy headed" in the morning. MRI Brain unremarkable and pt did not meet criteria for CPAP during sleep study. She has factor V leiden mutation and is on Xarelto  for portal vein thrombosis. She saw hematologist but released from their care in 2022.  09/01/2022 [facility] upper extremities -  This is an abnormal electrodiagnostic exam consistent with 1) Right mild (grade II) carpal tunnel syndrome (median nerve entrapment at wrist).   08/26/2022 [facility] lower extremities -  This is an abnormal electrodiagnostic study consistent with a generalized severe motor polyneuropathy, this study may over estimate degree of polyneuropathy due to edema and adipose tissue.  Brain MRI 10/27/22: IMPRESSION: Negative brain  MRI.  No infarct or other explanation for symptoms.    Pain: Yes, R knee pain, receives cortisone injections, history of chronic low back pain but not problematic recently Numbness/Tingling: Yes, history of BLE stocking distribution neuropathy to the ankles; Focal Weakness: No Recent changes in overall health/medication: Yes, recently started Mounjaro; Prior history of physical therapy for balance:  No Dominant hand: right Imaging: Yes, see history; Red flags: Denies personal history of cancer, chills/fever, night sweats, nausea, vomiting,   PRECAUTIONS: None  WEIGHT BEARING RESTRICTIONS: No  FALLS: Has patient fallen in last 6 months? No,   Living Environment Lives with: lives alone Lives in: Mobile home Stairs: 5 steps, bilateral narrow hand rails Has following equipment at home: Single point cane, occasionally uses when walking longer distances  Prior level of function: Independent  Occupational demands: Retired from Audiological scientist work  Hobbies: Running errands, spending time with extended family  Patient Goals: Primary concern is imbalance while in the shower;   OBJECTIVE:   Patient Surveys  FOTO: 79, predicted improvement to 45; ABC: To be completed  Cognition Patient is oriented to person, place, and time.  Recent memory is intact.  Remote memory is intact.  Attention span and concentration are intact.  Expressive speech is intact.  Patient's fund of knowledge is within normal limits for educational level.    Gross Musculoskeletal Assessment Tremor: None Bulk: Normal Tone: Normal  Posture: No gross abnormalities noted in standing or seated posture  AROM No gross functional deficits identified;  LE MMT: MMT (out of 5) Right  Left   Hip flexion 4 4  Hip extension    Hip abduction (seated) 4 4  Hip adduction (seated) 4 4  Hip internal rotation    Hip external rotation    Knee flexion (seated) 5 5  Knee extension 5 5  Ankle dorsiflexion 4+ 4+  (* =  pain; Blank rows = not tested)  Sensation Stocking distribution neuropathy bilateral feet up to ankles;  Reflexes Deferred  Cranial Nerves Deferred  Coordination/Cerebellar Deferred  Bed mobility: Deferred  Transfers: Assistive device utilized: None  Sit to stand: Complete Independence Stand to sit: Complete Independence Chair to chair: Complete Independence Floor:  Deferred  Curb:  Deferred  Stairs: Level of Assistance: Complete Independence Stair Negotiation Technique: Alternating Pattern  with No Rails Number of Stairs: 4  Height of Stairs: 6"  Comments:  No deficits identified  Gait: Gait pattern: WFL Distance walked: 100' Assistive device utilized: None Level of assistance: Complete Independence Comments: No significant deficits identified. No evidence of foot drop;  Functional Outcome Measures  Results 03/21/22 Comments  BERG 55/56    MiniBEST  23/28   FGA  26/30   ABC  85%   TUG 8.8 seconds    5TSTS 12.2 seconds    6 Minute Walk Test     10 Meter Gait Speed     (Blank rows = not tested)  mCTSIB: 30s in all 4 conditions, significant sway in condition 4;  BPPV TESTS:  Symptoms Duration Intensity Nystagmus  L Dix-Hallpike None   None  R Dix-Hallpike None   None  L Head Roll Dizziness 1-2s mild Possible 1-2 L torsional beats;  R Head Roll None   None  L Sidelying Test None   None  R Sidelying Test      (blank = not tested)   TODAY'S TREATMENT    SUBJECTIVE: Pt reports that she is doing well today. No changes since the last therapy session. No back pain upon arrival today. HEP going well. No specific questions upon arrival.   PAIN: Denies   Neuromuscular Re-education  NuStep L1-3 x 7 minutes for BLE strengthening and warm-up during interval history; Airex alternating 12" step taps 2 x 15 BLE; Airex FT balance without UE support eyes open/closed x 30s each; Airex FT balance without UE support with horizontal/vertical head turns x 30s  each; Airex tandem balance without UE support x 60s with each foot forward eyes open/closed; Airex tandem balance without UE support and vertical/horizontal head turns x 30s with each foot forward; Rockerboard A/P and R/L static balance without UE support eyes open/closed x 60s each; Rockerboard A/P and R/L weight shifts without UE support x 60s; Tandem gait on ladder x multiple lengths; BOSU balance toe taps on 6" 2 x 60s; BOSU balance ball toss with therapist 2 x 60s;   Not performed: Blaze pods toe touch on BOSU, 3 pods on 12" step with focus pattern (2 distracting) 4 x 60s;  BOSU balance ball chops in // bars without UE support 2 x 45s; 1/2 foam roll flat side up tandem balance alternating forward LE x 60s each; 1/2 foam roll flat side up AP balance x 60s; BOSU balance tosses in // bars without UE support 2 x 60s; Rockerboard A/P and R/L horizontal and vertical head turns without UE support x 60s each; Airex balance beam tandem gait without UE support x multiple lengths; Airex balance beam side stepping without UE support x multiple lengths; Forefeet on ramp with eyes closed balance x 60s; 6" cone taps in 1, 2, and 3 cone tapping patterns with therapist calling out laterality and sequence x multiple trials on each side; Cross-over steps in // bars without UE support x 2 lengths; Braiding steps in // bars without UE support x 4 lengths;   PATIENT EDUCATION:  Education details: Plan of care and HEP; Person educated: Patient Education method: Explanation, Demonstration, and Handouts Education comprehension: verbalized understanding   HOME EXERCISE PROGRAM:  Access Code: [email or web address] URL: [email or web address] Date: 03/29/2023 Prepared by: Crawford Dock  Exercises - Standing Single Leg Stance with Counter Support  - 2 x daily - 7 x weekly - 3 reps - 30s hold - Standing Single Leg Stance with Counter Support (Mirrored)  - 2 x daily - 7 x weekly - 3 reps - 30s hold -  Narrow Stance  with Eyes Closed and Head Rotation  - 2 x daily - 7 x weekly - 3 reps - 30s hold - Tandem Stance with Head Rotation  - 2 x daily - 7 x weekly - 3 reps - 30s hold - Tandem Stance with Head Rotation (Mirrored)  - 2 x daily - 7 x weekly - 3 reps - 30s hold   ASSESSMENT:  CLINICAL IMPRESSION: Progressed balance exercises during session today. No HEP updates at this time. Pt encouraged to follow-up as scheduled. She will benefit from PT services to address deficits in strength, balance, and mobility in order to return to full function at home and decrease her risk for falls.    OBJECTIVE IMPAIRMENTS: decreased balance.   ACTIVITY LIMITATIONS: bathing  PARTICIPATION LIMITATIONS: community activity  PERSONAL FACTORS: Time since onset of injury/illness/exacerbation and 3+ comorbidities: neuropathy, DMII, and anemia  are also affecting patient's functional outcome.   REHAB POTENTIAL: Excellent  CLINICAL DECISION MAKING: Stable/uncomplicated  EVALUATION COMPLEXITY: Low   GOALS: Goals reviewed with patient? No  SHORT TERM GOALS: Target date: 04/12/2023   Pt will be independent with HEP in order to improve strength and balance in order to decrease fall risk and improve function at home. Baseline:  Goal status: INITIAL   LONG TERM GOALS: Target date: 05/10/2023   Pt will increase FOTO to at least 80 to demonstrate significant improvement in function at home related to balance  Baseline: 79  Goal status: INITIAL  2.  Pt will improve BERG by at least 3 points in order to demonstrate clinically significant improvement in balance.   Baseline: 55/56; Goal status: DISCONTINUED  3.  Pt will improve ABC by at least 13% in order to demonstrate clinically significant improvement in balance confidence.      Baseline: To be completed; 03/22/23: 85% Goal status: DISCONTINUED  4.  Pt will improve miniBESTest by at least 4 points in order to demonstrate clinically significant  improvement in balance and decreased risk for falls       Baseline: 03/22/23: 23/28; Goal status: INITIAL  5.  Pt will improve single leg balance to at least 20s on each side in order to demonstrate clinically significant improvement in balance and decreased risk for falls       Baseline: 03/22/23: approximately 10-15s on each side; Goal status: INITIAL   PLAN: PT FREQUENCY: 2x/week  PT DURATION: 8 weeks  PLANNED INTERVENTIONS: Therapeutic exercises, Therapeutic activity, Neuromuscular re-education, Balance training, Gait training, Patient/Family education, Self Care, Joint mobilization, Joint manipulation, Vestibular training, Canalith repositioning, Orthotic/Fit training, DME instructions, Dry Needling, Electrical stimulation, Spinal manipulation, Spinal mobilization, Cryotherapy, Moist heat, Traction, Manual therapy, and Re-evaluation.  PLAN FOR NEXT SESSION: progress balance exercises, review/modify HEP as necessary  Kierra Jezewski, SPT [employer] [facility]   Jason D Huprich PT, DPT, GCS  Huprich,Jason 04/19/2023, 5:22 PM

## 2023-04-24 ENCOUNTER — Other Ambulatory Visit: Payer: Self-pay | Admitting: Family Medicine

## 2023-04-24 DIAGNOSIS — J302 Other seasonal allergic rhinitis: Secondary | ICD-10-CM

## 2023-04-26 ENCOUNTER — Ambulatory Visit: Payer: Medicare PPO

## 2023-04-26 DIAGNOSIS — R2681 Unsteadiness on feet: Secondary | ICD-10-CM | POA: Diagnosis not present

## 2023-04-26 NOTE — Therapy (Signed)
 OUTPATIENT PHYSICAL THERAPY BALANCE TREATMENT/DISCHARGE  Patient Name: Emily Pitts MRN: [id number] DOB:Aug 27, 1955, 68 y.o., female Today's Date: 04/26/2023  END OF SESSION:  PT End of Session - 04/26/23 0933     Visit Number 7    Number of Visits 17    Authorization Type eval: 03/15/23    PT Start Time 0930    PT Stop Time 1015    PT Time Calculation (min) 45 min    Equipment Utilized During Treatment Gait belt    Activity Tolerance Patient tolerated treatment well    Behavior During Therapy WFL for tasks assessed/performed            Past Medical History:  Diagnosis Date   Allergy    Diabetes mellitus without complication (HCC) 2012   Hyperlipidemia    Hypertension    Low back pain, episodic    Numbness of feet    Vitamin D deficiency    Past Surgical History:  Procedure Laterality Date   ABDOMINAL HYSTERECTOMY  2010   APPENDECTOMY  1966   COLONOSCOPY  06-07-13   Dr Lemar Livings   DILATION AND CURETTAGE OF UTERUS     OOPHORECTOMY     Patient Active Problem List   Diagnosis Date Noted   Low serum vitamin B12 05/19/2022   Type 2 diabetes mellitus with microalbuminuria, without long-term current use of insulin (HCC) 05/19/2022   Hypertension associated with diabetes (HCC) 02/12/2022   Iron deficiency anemia 02/14/2018   Chondromalacia, right knee 10/30/2016   Long term current use of anticoagulant therapy 01/05/2016   Factor 5 Leiden mutation, heterozygous (HCC) 05/02/2015   Portal vein thrombosis 11/07/2014   History of sepsis 10/10/2014   Abnormal electrocardiogram 10/08/2014   Nonspecific abnormal electromyogram (EMG) 10/08/2014   Benign essential HTN 10/08/2014   Controlled type 2 diabetes with neuropathy (HCC) 10/08/2014   Dyslipidemia 10/08/2014   Low back pain 10/08/2014   Gastro-esophageal reflux disease without esophagitis 10/08/2014   Microalbuminuria 10/08/2014   Disturbance of skin sensation 10/08/2014   Obesity (BMI 30-39.9) 10/08/2014   Perennial  allergic rhinitis with seasonal variation 10/08/2014   Plantar fasciitis 10/08/2014   Postablative ovarian failure 10/08/2014   Menopausal symptom 10/08/2014   Vitamin D deficiency 10/08/2014    PCP: Alba Cory, MD  REFERRING PROVIDER: Lonell Face, MD   REFERRING DIAG: E11.42 (ICD-10-CM) - Diabetic peripheral neuropathy (HCC)  RATIONALE FOR EVALUATION AND TREATMENT: Rehabilitation  THERAPY DIAG: Unsteadiness on feet  ONSET DATE: "several months"  FOLLOW-UP APPT SCHEDULED WITH REFERRING PROVIDER: Yes   FROM INITIAL EVALUATION SUBJECTIVE:  SUBJECTIVE STATEMENT:  Unsteadiness  PERTINENT HISTORY:  Pt referred to PT by neurology for imbalance. She has a history of diabetic peripheral neuropathy and B12 deficiency. She complains imbalance for the last several months particularly with eyes closed/darkeness such as when showering or getting up at night to go to the bathroom. Pt reports drifting when walking. She has a distant history of BPPV improved with the Epley maneuver. Pt reports that she stil occasionally feels "swimmy headed" in the morning. MRI Brain unremarkable and pt did not meet criteria for CPAP during sleep study. She has factor V leiden mutation and is on Xarelto for portal vein thrombosis. She saw hematologist but released from their care in 2022.  09/01/2022 NCS upper extremities -  This is an abnormal electrodiagnostic exam consistent with 1) Right mild (grade II) carpal tunnel syndrome (median nerve entrapment at wrist).   08/26/2022 NCS lower extremities -  This is an abnormal electrodiagnostic study consistent with a generalized severe motor polyneuropathy, this study may over estimate degree of polyneuropathy due to edema and adipose tissue.  Brain MRI  10/27/22: IMPRESSION: Negative brain MRI.  No infarct or other explanation for symptoms.    Pain: Yes, R knee pain, receives cortisone injections, history of chronic low back pain but not problematic recently Numbness/Tingling: Yes, history of BLE stocking distribution neuropathy to the ankles; Focal Weakness: No Recent changes in overall health/medication: Yes, recently started [facility]; Prior history of physical therapy for balance:  No Dominant hand: right Imaging: Yes, see history; Red flags: Denies personal history of cancer, chills/fever, night sweats, nausea, vomiting,   PRECAUTIONS: None  WEIGHT BEARING RESTRICTIONS: No  FALLS: Has patient fallen in last 6 months? No,   Living Environment Lives with: lives alone Lives in: Mobile home Stairs: 5 steps, bilateral narrow hand rails Has following equipment at home: Single point cane, occasionally uses when walking longer distances  Prior level of function: Independent  Occupational demands: Retired from Audiological scientist work  Hobbies: Running errands, spending time with extended family  Patient Goals: Primary concern is imbalance while in the shower;   OBJECTIVE:   Patient Surveys  FOTO: 68, predicted improvement to 20; ABC: To be completed  Cognition Patient is oriented to person, place, and time.  Recent memory is intact.  Remote memory is intact.  Attention span and concentration are intact.  Expressive speech is intact.  Patient's fund of knowledge is within normal limits for educational level.    Gross Musculoskeletal Assessment Tremor: None Bulk: Normal Tone: Normal  Posture: No gross abnormalities noted in standing or seated posture  AROM No gross functional deficits identified;  LE MMT: MMT (out of 5) Right  Left   Hip flexion 4 4  Hip extension    Hip abduction (seated) 4 4  Hip adduction (seated) 4 4  Hip internal rotation    Hip external rotation    Knee flexion (seated) 5 5  Knee extension  5 5  Ankle dorsiflexion 4+ 4+  (* = pain; Blank rows = not tested)  Sensation Stocking distribution neuropathy bilateral feet up to ankles;  Reflexes Deferred  Cranial Nerves Deferred  Coordination/Cerebellar Deferred  Bed mobility: Deferred  Transfers: Assistive device utilized: None  Sit to stand: Complete Independence Stand to sit: Complete Independence Chair to chair: Complete Independence Floor:  Deferred  Curb:  Deferred  Stairs: Level of Assistance: Complete Independence Stair Negotiation Technique: Alternating Pattern  with No Rails Number of Stairs: 4  Height of Stairs: 6"  Comments:  No deficits identified  Gait: Gait pattern: WFL Distance walked: 100' Assistive device utilized: None Level of assistance: Complete Independence Comments: No significant deficits identified. No evidence of foot drop;  Functional Outcome Measures  Results 03/21/22 Comments  BERG 55/56    MiniBEST  23/28   FGA  26/30   ABC  85%   TUG 8.8 seconds    5TSTS 12.2 seconds    6 Minute Walk Test     10 Meter Gait Speed     (Blank rows = not tested)  mCTSIB: 30s in all 4 conditions, significant sway in condition 4;  BPPV TESTS:  Symptoms Duration Intensity Nystagmus  L Dix-Hallpike None   None  R Dix-Hallpike None   None  L Head Roll Dizziness 1-2s mild Possible 1-2 L torsional beats;  R Head Roll None   None  L Sidelying Test None   None  R Sidelying Test      (blank = not tested)   TODAY'S TREATMENT    SUBJECTIVE: Pt reports that she is doing well today. No changes since the last therapy session, plan to discharge today. No back pain upon arrival today. HEP going well. No specific questions upon arrival.   PAIN: Denies   Neuromuscular Re-education  NuStep L1-3 x 9 minutes for BLE strengthening and warm-up during interval history; Airex alternating 12" step taps 2 x 15 BLE; Forward/reverse gait in hallway with horizontal/vertical head turns while balancing  small ball on cone x multiple lengths (switch hands); Forward marching gait in hallway while balancing small ball on cone x multiple lengths (switch hands); Airex tandem balance ball toss with therapist x 60s each side; Airex tandem balance without UE support x 60s with each foot forward eyes open/closed; Airex tandem balance without UE support and vertical/horizontal head turns x 30s with each foot forward; Rockerboard A/P and R/L static balance without UE support eyes open/closed x 60s each; Rockerboard A/P and R/L weight shifts without UE support x 60s;  Updated outcome measures 04/26/23: FOTO: 93 miniBEST: 26 Single leg balance: R: 5s L: 6s   Not performed: Airex FT balance without UE support eyes open/closed x 30s each; Airex FT balance without UE support with horizontal/vertical head turns x 30s each; Tandem gait on ladder x multiple lengths; BOSU balance toe taps on 6" 2 x 60s; Blaze pods toe touch on BOSU, 3 pods on 12" step with focus pattern (2 distracting) 4 x 60s;  BOSU balance ball chops in // bars without UE support 2 x 45s; 1/2 foam roll flat side up tandem balance alternating forward LE x 60s each; 1/2 foam roll flat side up AP balance x 60s; BOSU balance tosses in // bars without UE support 2 x 60s; Rockerboard A/P and R/L horizontal and vertical head turns without UE support x 60s each; Airex balance beam tandem gait without UE support x multiple lengths; Airex balance beam side stepping without UE support x multiple lengths; Forefeet on ramp with eyes closed balance x 60s; 6" cone taps in 1, 2, and 3 cone tapping patterns with therapist calling out laterality and sequence x multiple trials on each side; Cross-over steps in // bars without UE support x 2 lengths; Braiding steps in // bars without UE support x 4 lengths;   PATIENT EDUCATION:  Education details: Plan of care and HEP; Person educated: Patient Education method: Explanation, Demonstration, and  Handouts Education comprehension: verbalized understanding   HOME EXERCISE PROGRAM:  Access Code: [email or web address] URL: [email or web address] Date:  04/26/2023 Prepared by: Ria Comment  Exercises - Standing Single Leg Stance with Counter Support  - 2 x daily - 7 x weekly - 3 reps - 30s hold - Standing Single Leg Stance with Counter Support (Mirrored)  - 2 x daily - 7 x weekly - 3 reps - 30s hold - Narrow Stance with Eyes Closed and Head Rotation  - 2 x daily - 7 x weekly - 3 reps - 30s hold - Tandem Stance with Head Rotation  - 2 x daily - 7 x weekly - 3 reps - 30s hold - Tandem Stance with Head Rotation (Mirrored)  - 2 x daily - 7 x weekly - 3 reps - 30s hold - Tandem Walking Next to Counter  - 2 x daily - 7 x weekly - 3 reps - 60s hold   ASSESSMENT:  CLINICAL IMPRESSION: Continued balance exercises during session today. Today's session focused on updating outcome measures and goals for discharge. Pt improved her balance FOTO score to 93. Pt further improved her miniBEST score to a 26/28, indicating improvement in her balance. Updated pt's HEP to incorporate additional balance progressions upon discharge today. Overall, pt has benefited from skilled therapy to address balance deficits and has shown improvement since intake. She is ready to be discharged today.    OBJECTIVE IMPAIRMENTS: decreased balance.   ACTIVITY LIMITATIONS: bathing  PARTICIPATION LIMITATIONS: community activity  PERSONAL FACTORS: Time since onset of injury/illness/exacerbation and 3+ comorbidities: neuropathy, DMII, and anemia  are also affecting patient's functional outcome.   REHAB POTENTIAL: Excellent  CLINICAL DECISION MAKING: Stable/uncomplicated  EVALUATION COMPLEXITY: Low   GOALS: Goals reviewed with patient? No  SHORT TERM GOALS: Target date: 04/12/2023   Pt will be independent with HEP in order to improve strength and balance in order to decrease fall risk and improve function at  home. Baseline:  Goal status: INITIAL   LONG TERM GOALS: Target date: 05/10/2023   Pt will increase FOTO to at least 80 to demonstrate significant improvement in function at home related to balance  Baseline: 79; 04/26/23: 93  Goal status: ACHIEVED  2.  Pt will improve BERG by at least 3 points in order to demonstrate clinically significant improvement in balance.   Baseline: 55/56; Goal status: DISCONTINUED  3.  Pt will improve ABC by at least 13% in order to demonstrate clinically significant improvement in balance confidence.      Baseline: To be completed; 03/22/23: 85% Goal status: DISCONTINUED  4.  Pt will improve miniBESTest by at least 4 points in order to demonstrate clinically significant improvement in balance and decreased risk for falls       Baseline: 03/22/23: 23/28; 04/26/23: 26/28 Goal status: PARTIALLY MET  5.  Pt will improve single leg balance to at least 20s on each side in order to demonstrate clinically significant improvement in balance and decreased risk for falls       Baseline: 03/22/23: approximately 10-15s on each side; 04/26/23: R 5s L:6s Goal status: NOT MET   PLAN: PT FREQUENCY: 2x/week  PT DURATION: 8 weeks  PLANNED INTERVENTIONS: Therapeutic exercises, Therapeutic activity, Neuromuscular re-education, Balance training, Gait training, Patient/Family education, Self Care, Joint mobilization, Joint manipulation, Vestibular training, Canalith repositioning, Orthotic/Fit training, DME instructions, Dry Needling, Electrical stimulation, Spinal manipulation, Spinal mobilization, Cryotherapy, Moist heat, Traction, Manual therapy, and Re-evaluation.  PLAN FOR NEXT SESSION: Discharge  Sherri Sear, SPT [facility]   Sharalyn Ink Huprich PT, DPT, GCS  Huprich,Jason 04/26/2023, 2:18 PM

## 2023-04-27 ENCOUNTER — Ambulatory Visit: Payer: Medicare PPO | Admitting: Family Medicine

## 2023-04-27 ENCOUNTER — Encounter: Payer: Self-pay | Admitting: Family Medicine

## 2023-04-27 VITALS — BP 124/78 | HR 97 | Temp 98.4°F | Resp 16 | Ht 69.0 in | Wt 233.5 lb

## 2023-04-27 DIAGNOSIS — Z7985 Long-term (current) use of injectable non-insulin antidiabetic drugs: Secondary | ICD-10-CM

## 2023-04-27 DIAGNOSIS — E1140 Type 2 diabetes mellitus with diabetic neuropathy, unspecified: Secondary | ICD-10-CM

## 2023-04-27 DIAGNOSIS — Z1211 Encounter for screening for malignant neoplasm of colon: Secondary | ICD-10-CM

## 2023-04-27 DIAGNOSIS — R6884 Jaw pain: Secondary | ICD-10-CM | POA: Diagnosis not present

## 2023-04-27 MED ORDER — TIRZEPATIDE 7.5 MG/0.5ML ~~LOC~~ SOAJ: 7.5000 mg | SUBCUTANEOUS | 0 refills | Status: DC

## 2023-04-27 NOTE — Progress Notes (Signed)
 Name: Emily Pitts   MRN: [id number]    DOB: October 23, 1955   Date:04/27/2023       Progress Note  Subjective  Chief Complaint  Chief Complaint  Patient presents with   Medical Management of Chronic Issues   HPI   Discussed the use of AI scribe software for clinical note transcription with the patient, who gave verbal consent to proceed.  History of Present Illness   Emily Pitts is a 68 year old female with diabetes who presents for follow-up on her diabetes management.  Her A1c increased from  5.5% to 7% when she came in for her regular visit in  December. She was previously taking Rybelsus, which seemed to lose effectiveness, leading to weight gain and increase in A1C. During her visit in Dec we switched her from Rybelsus to [facility] 5 mg, she is tolerating medication well, but has gained another 3 lbs and continues to crave sweets. She  has not been monitoring her blood sugar at home. She acknowledges a diet high in sweets, which she describes as an addiction. She is also taking 750 mg of  metformin  at night.  She experiences abdominal pain, described as cramping, which predates the initiation of Mounjaro. There is no constipation, but she notes the need to move her bowels when the pain occurs. She is due for a colonoscopy this year and we will place a referral, she denies blood in stools , weight loss or diarrhea  She has ear pain, described as aching, more at night and sometimes during the day on left side only . There is tenderness under her jaw but no pain with jaw movement. She has been using ear drops without relief and has not tried Tylenol for this issue but uses it occasionally for knee pain.  She completed physical therapy for diabetic neuropathy, which helped with balance issues. She acknowledges the need to continue exercises daily to maintain improvement.      Patient Active Problem List   Diagnosis Date Noted   Low serum vitamin B12 05/19/2022   Type 2 diabetes mellitus  with microalbuminuria, without long-term current use of insulin (HCC) 05/19/2022   Hypertension associated with diabetes (HCC) 02/12/2022   Iron deficiency anemia 02/14/2018   Chondromalacia, right knee 10/30/2016   Long term current use of anticoagulant therapy 01/05/2016   Factor 5 Leiden mutation, heterozygous (HCC) 05/02/2015   Portal vein thrombosis 11/07/2014   History of sepsis 10/10/2014   Abnormal electrocardiogram 10/08/2014   Nonspecific abnormal electromyogram (EMG) 10/08/2014   Benign essential HTN 10/08/2014   Controlled type 2 diabetes with neuropathy (HCC) 10/08/2014   Dyslipidemia 10/08/2014   Low back pain 10/08/2014   Gastro-esophageal reflux disease without esophagitis 10/08/2014   Microalbuminuria 10/08/2014   Disturbance of skin sensation 10/08/2014   Obesity (BMI 30-39.9) 10/08/2014   Perennial allergic rhinitis with seasonal variation 10/08/2014   Plantar fasciitis 10/08/2014   Postablative ovarian failure 10/08/2014   Menopausal symptom 10/08/2014   Vitamin D deficiency 10/08/2014    Past Surgical History:  Procedure Laterality Date   ABDOMINAL HYSTERECTOMY  2010   APPENDECTOMY  1966   COLONOSCOPY  06-07-13   Dr Lemar Livings   DILATION AND CURETTAGE OF UTERUS     OOPHORECTOMY      Family History  Problem Relation Age of Onset   Diabetes Mother    COPD Mother    Diabetes Father    Hypertension Father    Cancer Father  Kidney and Prostate   CAD Father    Diabetes Brother        Oldest Brother   Kidney disease Brother        Tumor removed   Breast cancer Neg Hx     Social History   Tobacco Use   Smoking status: Never   Smokeless tobacco: Never  Substance Use Topics   Alcohol use: No    Alcohol/week: 0.0 standard drinks of alcohol     Current Outpatient Medications:    atorvastatin (LIPITOR) 40 MG tablet, Take 1 tablet (40 mg total) by mouth daily., Disp: 90 tablet, Rfl: 3   azelastine (ASTELIN) 0.1 % nasal spray, Place 2 sprays into  both nostrils 2 (two) times daily. Use in each nostril as directed, Disp: 30 mL, Rfl: 2   cholecalciferol (VITAMIN D) 1000 units tablet, Take 500 Units by mouth daily. , Disp: , Rfl:    fluticasone (FLONASE) 50 MCG/ACT nasal spray, USE 2 SPRAY(S) IN EACH NOSTRIL AS NEEDED, Disp: 48 g, Rfl: 0   Iron-FA-B Cmp-C-Biot-Probiotic (FUSION PLUS PO), Take 1 tablet by mouth daily. Vitamin with iron, Disp: , Rfl:    levocetirizine (XYZAL) 5 MG tablet, Take 1 tablet by mouth once daily, Disp: 90 tablet, Rfl: 0   MAGNESIUM OXIDE PO, Take 500 mg by mouth daily. , Disp: , Rfl:    metFORMIN (GLUCOPHAGE-XR) 750 MG 24 hr tablet, Take 1 tablet (750 mg total) by mouth daily with breakfast., Disp: 90 tablet, Rfl: 1   montelukast (SINGULAIR) 10 MG tablet, TAKE 1 TABLET BY MOUTH AT BEDTIME, Disp: 90 tablet, Rfl: 0   olmesartan-hydrochlorothiazide (BENICAR HCT) 20-12.5 MG tablet, Take 1 tablet by mouth daily., Disp: 90 tablet, Rfl: 1   polyethylene glycol powder (GLYCOLAX/MIRALAX) powder, Take 17 g by mouth 2 (two) times daily., Disp: 3350 g, Rfl: 1   pregabalin (LYRICA) 100 MG capsule, Take 1 capsule (100 mg total) by mouth 3 (three) times daily., Disp: 270 capsule, Rfl: 1   pregabalin (LYRICA) 200 MG capsule, Take 1 capsule by mouth at bedtime, Disp: 90 capsule, Rfl: 0   rivaroxaban (XARELTO) 20 MG TABS tablet, Take 1 tablet (20 mg total) by mouth daily with supper., Disp: 90 tablet, Rfl: 1   tirzepatide (MOUNJARO) 5 MG/0.5ML Pen, Inject 5 mg into the skin once a week., Disp: 6 mL, Rfl: 0   Turmeric Curcumin 500 MG CAPS, , Disp: , Rfl:    vitamin B-12 (CYANOCOBALAMIN) 500 MCG tablet, Take 1,000 mcg by mouth daily., Disp: , Rfl:    glucose blood (ONE TOUCH ULTRA TEST) test strip, USE AS DIRECTED (Patient not taking: Reported on 10/26/2022), Disp: 100 each, Rfl: 12  No Known Allergies  I personally reviewed active problem list, medication list, allergies, family history with the patient/caregiver today.   ROS  Ten  systems reviewed and is negative except as mentioned in HPI    Objective  Vitals:   04/27/23 0845  BP: 124/78  Pulse: 97  Resp: 16  Temp: 98.4 F (36.9 C)  TempSrc: Oral  SpO2: 98%  Weight: 233 lb 8 oz (105.9 kg)  Height: 5\' 9"  (1.753 m)    Body mass index is 34.48 kg/m.  Physical Exam  Constitutional: Patient appears well-developed and well-nourished. Obese  No distress.  HEENT: head atraumatic, normocephalic, pupils equal and reactive to ligh, neck supple Cardiovascular: Normal rate, regular rhythm and normal heart sounds.  No murmur heard. No BLE edema. Pulmonary/Chest: Effort normal and breath sounds normal. No  respiratory distress. Abdominal: Soft.  There is no tenderness. Psychiatric: Patient has a normal mood and affect. behavior is normal. Judgment and thought content normal.   Recent Results (from the past 2160 hours)  POCT glycosylated hemoglobin (Hb A1C)     Status: Abnormal   Collection Time: 02/23/23  7:55 AM  Result Value Ref Range   Hemoglobin A1C 7.0 (A) 4.0 - 5.6 %   HbA1c POC (<> result, manual entry)     HbA1c, POC (prediabetic range)     HbA1c, POC (controlled diabetic range)      Diabetic Foot Exam:     PHQ2/9:    04/27/2023    8:38 AM 02/23/2023    7:44 AM 10/26/2022   11:41 AM 10/21/2022    8:00 AM 05/19/2022    1:16 PM  Depression screen PHQ 2/9  Decreased Interest 0 0 0 0 0  Down, Depressed, Hopeless 0 0 0 0 0  PHQ - 2 Score 0 0 0 0 0  Altered sleeping 0 0  0 0  Tired, decreased energy 0 0  0 0  Change in appetite 0 0  0 0  Feeling bad or failure about yourself  0 0  0 0  Trouble concentrating 0 0  0 0  Moving slowly or fidgety/restless 0 0  0 0  Suicidal thoughts 0 0  0 0  PHQ-9 Score 0 0  0 0  Difficult doing work/chores Not difficult at all Not difficult at all       phq 9 is negative  Fall Risk:    04/27/2023    8:38 AM 04/25/2023    1:02 PM 02/23/2023    7:36 AM 10/26/2022   11:41 AM 10/21/2022    7:59 AM  Fall Risk    Falls in the past year? 0 0 0 0 0  Number falls in past yr: 0  0 0 0  Injury with Fall? 0  0 0 0  Risk for fall due to : No Fall Risks  No Fall Risks  No Fall Risks  Follow up Falls prevention discussed;Education provided;Falls evaluation completed  Falls prevention discussed;Education provided;Falls evaluation completed  Falls prevention discussed     Assessment & Plan     Type 2 Diabetes Mellitus with neuropathy  A1c increased from 5.5% to 7%. Recently switched to [facility] 5mg  -Increase Mounjaro dose gradually over the next few weeks. -Encourage patient to implement dietary changes, specifically reducing intake of sweets and unhealthy snacks. -Check A1c in two months (April 2025).  Abdominal Pain Patient reports abdominal pain before bowel movements. No acute symptoms suggestive of colon pathology. -Refer patient for colonoscopy screening at [facility] clinic (due April 2025).  Jaw Pain Patient reports chronic jaw pain, worse at night. 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 -Advise patient to take Tylenol as needed for pain, starting with dinner. -Recommend patient to avoid hard foods and excessive chewing. -Advise patient to see a dentist to ensure oral health.  General Health Maintenance -Encourage patient to continue physical therapy exercises for balance related to diabetic neuropathy. -Advise patient to purge pantry of unhealthy food items and replace with healthier options.

## 2023-04-29 ENCOUNTER — Ambulatory Visit (INDEPENDENT_AMBULATORY_CARE_PROVIDER_SITE_OTHER): Payer: Medicare PPO

## 2023-04-29 DIAGNOSIS — Z0001 Encounter for general adult medical examination with abnormal findings: Secondary | ICD-10-CM

## 2023-04-29 DIAGNOSIS — Z7984 Long term (current) use of oral hypoglycemic drugs: Secondary | ICD-10-CM | POA: Diagnosis not present

## 2023-04-29 DIAGNOSIS — E119 Type 2 diabetes mellitus without complications: Secondary | ICD-10-CM | POA: Diagnosis not present

## 2023-04-29 DIAGNOSIS — Z1211 Encounter for screening for malignant neoplasm of colon: Secondary | ICD-10-CM

## 2023-04-29 DIAGNOSIS — Z0000 Encounter for general adult medical examination without abnormal findings: Secondary | ICD-10-CM

## 2023-04-29 NOTE — Patient Instructions (Addendum)
 Ms. Tapley , Thank you for taking time to come for your Medicare Wellness Visit. I appreciate your ongoing commitment to your health goals. Please review the following plan we discussed and let me know if I can assist you in the future.   Referrals/Orders/Follow-Ups/Clinician Recommendations: REFERRAL FOR COLONOSCOPY SENT  This is a list of the screening recommended for you and due dates:  Health Maintenance  Topic Date Due   Colon Cancer Screening  06/08/2023   COVID-19 Vaccine (4 - 2024-25 season) 05/13/2023*   Eye exam for diabetics  07/29/2023   Hemoglobin A1C  08/24/2023   Yearly kidney function blood test for diabetes  10/21/2023   Yearly kidney health urinalysis for diabetes  10/21/2023   Complete foot exam   02/23/2024   Medicare Annual Wellness Visit  04/28/2024   Mammogram  03/11/2025   DTaP/Tdap/Td vaccine (4 - Td or Tdap) 10/24/2032   Pneumonia Vaccine  Completed   Flu Shot  Completed   DEXA scan (bone density measurement)  Completed   Hepatitis C Screening  Completed   Zoster (Shingles) Vaccine  Completed   HPV Vaccine  Aged Out  *Topic was postponed. The date shown is not the original due date.    Advanced directives: (ACP Link)Information on Advanced Care Planning can be found at [facility] Advance Health Care Directives Advance Health Care Directives ([email or web address]   Next Medicare Annual Wellness Visit scheduled for next year: Yes   05/04/24 @ 2:30 PM BY PHONE

## 2023-04-29 NOTE — Progress Notes (Signed)
 Subjective:   Emily Pitts is a 68 y.o. who presents for a Medicare Wellness preventive visit.  Visit Complete: Virtual I connected with  Emily Pitts on 04/29/23 by a audio enabled telemedicine application and verified that I am speaking with the correct person using two identifiers.  Patient Location: Home  Provider Location: Office/Clinic  I discussed the limitations of evaluation and management by telemedicine. The patient expressed understanding and agreed to proceed.  Vital Signs: Because this visit was a virtual/telehealth visit, some criteria may be missing or patient reported. Any vitals not documented were not able to be obtained and vitals that have been documented are patient reported.  VideoDeclined- This patient declined Librarian, academic. Therefore the visit was completed with audio only.  AWV Questionnaire: No: Patient Medicare AWV questionnaire was not completed prior to this visit.  Cardiac Risk Factors include: advanced age (>32men, >2 women);diabetes mellitus;dyslipidemia;hypertension;obesity (BMI >30kg/m2);sedentary lifestyle;microalbuminuria     Objective:    There were no vitals filed for this visit. There is no height or weight on file to calculate BMI.     04/29/2023    3:22 PM 04/23/2022    3:49 PM 04/22/2021    4:01 PM 11/05/2020    9:41 AM 11/06/2018    3:26 PM 05/05/2018    2:24 PM 11/04/2017    3:35 PM  Advanced Directives  Does Patient Have a Medical Advance Directive? No No No No No No No  Would patient like information on creating a medical advance directive? No - Patient declined Yes (ED - Information included in AVS) Yes (MAU/Ambulatory/Procedural Areas - Information given) No - Patient declined  No - Patient declined     Current Medications (verified) Outpatient Encounter Medications as of 04/29/2023  Medication Sig   atorvastatin (LIPITOR) 40 MG tablet Take 1 tablet (40 mg total) by mouth daily.   azelastine  (ASTELIN) 0.1 % nasal spray Place 2 sprays into both nostrils 2 (two) times daily. Use in each nostril as directed   cholecalciferol (VITAMIN D) 1000 units tablet Take 500 Units by mouth daily.    fluticasone (FLONASE) 50 MCG/ACT nasal spray USE 2 SPRAY(S) IN EACH NOSTRIL AS NEEDED   glucose blood (ONE TOUCH ULTRA TEST) test strip USE AS DIRECTED   Iron-FA-B Cmp-C-Biot-Probiotic (FUSION PLUS PO) Take 1 tablet by mouth daily. Vitamin with iron   levocetirizine (XYZAL) 5 MG tablet Take 1 tablet by mouth once daily   MAGNESIUM OXIDE PO Take 500 mg by mouth daily.    metFORMIN (GLUCOPHAGE-XR) 750 MG 24 hr tablet Take 1 tablet (750 mg total) by mouth daily with breakfast.   montelukast (SINGULAIR) 10 MG tablet TAKE 1 TABLET BY MOUTH AT BEDTIME   olmesartan-hydrochlorothiazide (BENICAR HCT) 20-12.5 MG tablet Take 1 tablet by mouth daily.   polyethylene glycol powder (GLYCOLAX/MIRALAX) powder Take 17 g by mouth 2 (two) times daily.   pregabalin (LYRICA) 100 MG capsule Take 1 capsule (100 mg total) by mouth 3 (three) times daily.   pregabalin (LYRICA) 200 MG capsule Take 1 capsule by mouth at bedtime   rivaroxaban (XARELTO) 20 MG TABS tablet Take 1 tablet (20 mg total) by mouth daily with supper.   tirzepatide (MOUNJARO) 7.5 MG/0.5ML Pen Inject 7.5 mg into the skin once a week.   Turmeric Curcumin 500 MG CAPS    vitamin B-12 (CYANOCOBALAMIN) 500 MCG tablet Take 1,000 mcg by mouth daily.   No facility-administered encounter medications on file as of 04/29/2023.  Allergies (verified) Patient has no known allergies.   History: Past Medical History:  Diagnosis Date   Allergy    Diabetes mellitus without complication (HCC) 2012   Hyperlipidemia    Hypertension    Low back pain, episodic    Numbness of feet    Vitamin D deficiency    Past Surgical History:  Procedure Laterality Date   ABDOMINAL HYSTERECTOMY  2010   APPENDECTOMY  1966   COLONOSCOPY  06-07-13   Dr Lemar Livings   DILATION AND  CURETTAGE OF UTERUS     OOPHORECTOMY     Family History  Problem Relation Age of Onset   Diabetes Mother    COPD Mother    Diabetes Father    Hypertension Father    Cancer Father        Kidney and Prostate   CAD Father    Diabetes Brother        Oldest Brother   Kidney disease Brother        Tumor removed   Breast cancer Neg Hx    Social History   Socioeconomic History   Marital status: Single    Spouse name: Not on file   Number of children: 0   Years of education: Not on file   Highest education level: 12th grade  Occupational History   Not on file  Tobacco Use   Smoking status: Never   Smokeless tobacco: Never  Vaping Use   Vaping status: Never Used  Substance and Sexual Activity   Alcohol use: No    Alcohol/week: 0.0 standard drinks of alcohol   Drug use: No   Sexual activity: Never  Other Topics Concern   Not on file  Social History Narrative   Not on file   Social Drivers of Health   Financial Resource Strain: Low Risk  (04/29/2023)   Overall Financial Resource Strain (CARDIA)    Difficulty of Paying Living Expenses: Not hard at all  Food Insecurity: No Food Insecurity (04/29/2023)   Hunger Vital Sign    Worried About Running Out of Food in the Last Year: Never true    Ran Out of Food in the Last Year: Never true  Transportation Needs: No Transportation Needs (04/29/2023)   PRAPARE - Administrator, Civil Service (Medical): No    Lack of Transportation (Non-Medical): No  Physical Activity: Inactive (04/29/2023)   Exercise Vital Sign    Days of Exercise per Week: 0 days    Minutes of Exercise per Session: 0 min  Stress: No Stress Concern Present (04/29/2023)   [employer] of Occupational Health - Occupational Stress Questionnaire    Feeling of Stress : Not at all  Social Connections: Moderately Integrated (04/29/2023)   Social Connection and Isolation Panel [NHANES]    Frequency of Communication with Friends and Family: More than three  times a week    Frequency of Social Gatherings with Friends and Family: Twice a week    Attends Religious Services: More than 4 times per year    Active Member of [employer] or Organizations: Yes    Attends Engineer, structural: More than 4 times per year    Marital Status: Never married    Tobacco Counseling Counseling given: Not Answered    Clinical Intake:  Pre-visit preparation completed: Yes  Pain : No/denies pain     BMI - recorded: 34.4 Nutritional Status: BMI > 30  Obese Nutritional Risks: None Diabetes: Yes CBG done?: No Did pt. bring in  CBG monitor from home?: No  How often do you need to have someone help you when you read instructions, pamphlets, or other written materials from your doctor or pharmacy?: 1 - Never  Interpreter Needed?: No  Information entered by :: Kennedy Bucker, LPN   Activities of Daily Living     04/29/2023    3:23 PM 04/25/2023    1:02 PM  In your present state of health, do you have any difficulty performing the following activities:  Hearing? 0 0  Vision? 0 0  Difficulty concentrating or making decisions? 0 0  Walking or climbing stairs? 0 0  Dressing or bathing? 0 0  Doing errands, shopping? 0 0  Preparing Food and eating ? N N  Using the Toilet? N N  In the past six months, have you accidently leaked urine? Y Y  Do you have problems with loss of bowel control? N N  Managing your Medications? N N  Managing your Finances? N N  Housekeeping or managing your Housekeeping? N N    Patient Care Team: Alba Cory, MD as PCP - General (Family Medicine) Neale Burly, [place], MD (Inactive) as Consulting Physician (Dermatology) Galen Manila, MD as Referring Physician (Ophthalmology)  Indicate any recent Medical Services you may have received from other than [facility] providers in the past year (date may be approximate).     Assessment:   This is a routine wellness examination for Emily Pitts.  Hearing/Vision screen Hearing  Screening - Comments:: NO AIDS Vision Screening - Comments:: [facility]- DR.PORFILIO   Goals Addressed             This Visit's Progress    DIET - EAT MORE FRUITS AND VEGETABLES         Depression Screen     04/29/2023    3:20 PM 04/27/2023    8:38 AM 02/23/2023    7:44 AM 10/26/2022   11:41 AM 10/21/2022    8:00 AM 05/19/2022    1:16 PM 04/23/2022    3:42 PM  PHQ 2/9 Scores  PHQ - 2 Score 0 0 0 0 0 0 0  PHQ- 9 Score 0 0 0  0 0     Fall Risk     04/29/2023    3:23 PM 04/27/2023    8:38 AM 04/25/2023    1:02 PM 02/23/2023    7:36 AM 10/26/2022   11:41 AM  Fall Risk   Falls in the past year? 0 0 0 0 0  Number falls in past yr: 0 0  0 0  Injury with Fall? 0 0  0 0  Risk for fall due to : No Fall Risks No Fall Risks  No Fall Risks   Follow up Falls prevention discussed;Falls evaluation completed Falls prevention discussed;Education provided;Falls evaluation completed  Falls prevention discussed;Education provided;Falls evaluation completed     MEDICARE RISK AT HOME:  Medicare Risk at Home Any stairs in or around the home?: Yes If so, are there any without handrails?: Yes Home free of loose throw rugs in walkways, pet beds, electrical cords, etc?: Yes Adequate lighting in your home to reduce risk of falls?: Yes Life alert?: No Use of a cane, walker or w/c?: No Grab bars in the bathroom?: No Shower chair or bench in shower?: No Elevated toilet seat or a handicapped toilet?: No  TIMED UP AND GO:  Was the test performed?  No  Cognitive Function: 6CIT completed        04/29/2023    3:25 PM  04/23/2022    3:50 PM  6CIT Screen  What Year? 0 points 0 points  What month? 0 points 0 points  What time? 0 points 0 points  Count back from 20 0 points 0 points  Months in reverse 0 points 0 points  Repeat phrase 0 points 0 points  Total Score 0 points 0 points    Immunizations Immunization History  Administered Date(s) Administered   Fluad Quad(high Dose 65+) 11/28/2019,  01/02/2021, 12/15/2021   Fluad Trivalent(High Dose 65+) 12/10/2022   Influenza, Seasonal, Injecte, Preservative Fre 11/29/2012   Influenza,inj,Quad PF,6+ Mos 10/13/2013, 02/26/2017, 01/02/2019   Influenza-Unspecified 12/02/2014, 12/02/2015, 11/30/2017   PFIZER(Purple Top)SARS-COV-2 Vaccination 05/30/2019, 06/20/2019, 04/18/2020   Pneumococcal Conjugate-13 02/03/2016   Pneumococcal Polysaccharide-23 01/15/2015, 11/06/2019   Tdap 12/17/2008, 01/02/2019, 10/25/2022   Zoster Recombinant(Shingrix) 03/01/2019, 05/05/2019   Zoster, Live 05/02/2015    Screening Tests Health Maintenance  Topic Date Due   Colonoscopy  06/08/2023   COVID-19 Vaccine (4 - 2024-25 season) 05/13/2023 (Originally 11/08/2022)   OPHTHALMOLOGY EXAM  07/29/2023   HEMOGLOBIN A1C  08/24/2023   Diabetic kidney evaluation - eGFR measurement  10/21/2023   Diabetic kidney evaluation - Urine ACR  10/21/2023   FOOT EXAM  02/23/2024   Medicare Annual Wellness (AWV)  04/28/2024   MAMMOGRAM  03/11/2025   DTaP/Tdap/Td (4 - Td or Tdap) 10/24/2032   Pneumonia Vaccine 41+ Years old  Completed   INFLUENZA VACCINE  Completed   DEXA SCAN  Completed   Hepatitis C Screening  Completed   Zoster Vaccines- Shingrix  Completed   HPV VACCINES  Aged Out    Health Maintenance  Health Maintenance Due  Topic Date Due   Colonoscopy  06/08/2023   Health Maintenance Items Addressed: Referral sent to GI for colonoscopy MAMMOGRAM DONE 03/12/23 BDS 07/01/21- OSTEOPENIA  Additional Screening:  Vision Screening: Recommended annual ophthalmology exams for early detection of glaucoma and other disorders of the eye.  Dental Screening: Recommended annual dental exams for proper oral hygiene  Community Resource Referral / Chronic Care Management: CRR required this visit?  No   CCM required this visit?  No     Plan:     I have personally reviewed and noted the following in the patient's chart:   Medical and social history Use of  alcohol, tobacco or illicit drugs  Current medications and supplements including opioid prescriptions. Patient is not currently taking opioid prescriptions. Functional ability and status Nutritional status Physical activity Advanced directives List of other physicians Hospitalizations, surgeries, and ER visits in previous 12 months Vitals Screenings to include cognitive, depression, and falls Referrals and appointments  In addition, I have reviewed and discussed with patient certain preventive protocols, quality metrics, and best practice recommendations. A written personalized care plan for preventive services as well as general preventive health recommendations were provided to patient.     Hal Hope, LPN   [id number]   After Visit Summary: (MyChart) Due to this being a telephonic visit, the after visit summary with patients personalized plan was offered to patient via MyChart   Notes:  REFERRAL SENT FOR COLONOSCOPY

## 2023-05-07 ENCOUNTER — Telehealth: Payer: Self-pay | Admitting: *Deleted

## 2023-05-07 ENCOUNTER — Encounter: Payer: Self-pay | Admitting: Hematology and Oncology

## 2023-05-07 ENCOUNTER — Other Ambulatory Visit: Payer: Self-pay | Admitting: *Deleted

## 2023-05-07 DIAGNOSIS — Z860100 Personal history of colon polyps, unspecified: Secondary | ICD-10-CM

## 2023-05-07 MED ORDER — NA SULFATE-K SULFATE-MG SULF 17.5-3.13-1.6 GM/177ML PO SOLN: 1.0000 | Freq: Once | ORAL | 0 refills | Status: AC

## 2023-05-07 NOTE — Telephone Encounter (Signed)
 Gastroenterology Pre-Procedure Review  Request Date: 05/27/2023 Requesting Physician: Dr. Servando Snare  PATIENT REVIEW QUESTIONS: The patient responded to the following health history questions as indicated:    1. Are you having any GI issues? no 2. Do you have a personal history of Polyps? yes (she thinks she had colon polyps about 9-10 years ago, last colonoscopy was 06/08/2023) 3. Do you have a family history of Colon Cancer or Polyps? no 4. Diabetes Mellitus? yes (metformin and Mounjaro) 5. Joint replacements in the past 12 months?no 6. Major health problems in the past 3 months?no 7. Any artificial heart valves, MVP, or defibrillator?no    MEDICATIONS & ALLERGIES:    Patient reports the following regarding taking any anticoagulation/antiplatelet therapy:   Plavix, Coumadin, Eliquis, Xarelto, Lovenox, Pradaxa, Brilinta, or Effient? yes (Xarelto) Aspirin? no  Patient confirms/reports the following medications:  Current Outpatient Medications  Medication Sig Dispense Refill   atorvastatin (LIPITOR) 40 MG tablet Take 1 tablet (40 mg total) by mouth daily. 90 tablet 3   azelastine (ASTELIN) 0.1 % nasal spray Place 2 sprays into both nostrils 2 (two) times daily. Use in each nostril as directed 30 mL 2   cholecalciferol (VITAMIN D) 1000 units tablet Take 500 Units by mouth daily.      fluticasone (FLONASE) 50 MCG/ACT nasal spray USE 2 SPRAY(S) IN EACH NOSTRIL AS NEEDED 48 g 0   glucose blood (ONE TOUCH ULTRA TEST) test strip USE AS DIRECTED 100 each 12   Iron-FA-B Cmp-C-Biot-Probiotic (FUSION PLUS PO) Take 1 tablet by mouth daily. Vitamin with iron     levocetirizine (XYZAL) 5 MG tablet Take 1 tablet by mouth once daily 90 tablet 0   MAGNESIUM OXIDE PO Take 500 mg by mouth daily.      metFORMIN (GLUCOPHAGE-XR) 750 MG 24 hr tablet Take 1 tablet (750 mg total) by mouth daily with breakfast. 90 tablet 1   montelukast (SINGULAIR) 10 MG tablet TAKE 1 TABLET BY MOUTH AT BEDTIME 90 tablet 0    olmesartan-hydrochlorothiazide (BENICAR HCT) 20-12.5 MG tablet Take 1 tablet by mouth daily. 90 tablet 1   polyethylene glycol powder (GLYCOLAX/MIRALAX) powder Take 17 g by mouth 2 (two) times daily. 3350 g 1   pregabalin (LYRICA) 100 MG capsule Take 1 capsule (100 mg total) by mouth 3 (three) times daily. 270 capsule 1   pregabalin (LYRICA) 200 MG capsule Take 1 capsule by mouth at bedtime 90 capsule 0   rivaroxaban (XARELTO) 20 MG TABS tablet Take 1 tablet (20 mg total) by mouth daily with supper. 90 tablet 1   tirzepatide (MOUNJARO) 7.5 MG/0.5ML Pen Inject 7.5 mg into the skin once a week. 6 mL 0   Turmeric Curcumin 500 MG CAPS      vitamin B-12 (CYANOCOBALAMIN) 500 MCG tablet Take 1,000 mcg by mouth daily.     No current facility-administered medications for this visit.    Patient confirms/reports the following allergies:  No Known Allergies  No orders of the defined types were placed in this encounter.   AUTHORIZATION INFORMATION Primary Insurance: 1D#: Group #:  Secondary Insurance: 1D#: Group #:  SCHEDULE INFORMATION: Date: 05/27/2023 Time: Location:  [facility]

## 2023-05-17 ENCOUNTER — Encounter: Payer: Self-pay | Admitting: Family Medicine

## 2023-05-17 ENCOUNTER — Telehealth: Payer: Self-pay

## 2023-05-17 NOTE — Telephone Encounter (Signed)
 Spoken to patient and inform that I have already sent the request to Dr Carlynn Purl (PCP) regarding Emily Pitts. I have not heard anything back yet. Patient stated that she will contact Dr Carlynn Purl' office as well.

## 2023-05-17 NOTE — Telephone Encounter (Signed)
 The patient called she has questions about her medication.

## 2023-05-19 NOTE — Telephone Encounter (Signed)
 Patient have been notified that I have received the fax.  Reminded patient to stop Xarelto on Monday, 05/24/2023.  Patient verbalized understanding.

## 2023-05-19 NOTE — Telephone Encounter (Signed)
 Received faxed on 06/17/2023 4:46 pm  Patient is to stop Xarelto 20 mg 3 days before procedure and restart 1 day after.  Per Dr Carlynn Purl, PCP

## 2023-05-24 ENCOUNTER — Telehealth: Payer: Self-pay

## 2023-05-24 NOTE — Telephone Encounter (Signed)
 Pt left message returning call requesting call back

## 2023-05-25 ENCOUNTER — Telehealth: Payer: Self-pay | Admitting: Gastroenterology

## 2023-05-25 NOTE — Telephone Encounter (Signed)
 PT requesting call back to change procedure date

## 2023-06-27 ENCOUNTER — Other Ambulatory Visit: Payer: Self-pay | Admitting: Family Medicine

## 2023-06-27 DIAGNOSIS — D6851 Activated protein C resistance: Secondary | ICD-10-CM

## 2023-06-27 DIAGNOSIS — I81 Portal vein thrombosis: Secondary | ICD-10-CM

## 2023-06-29 ENCOUNTER — Other Ambulatory Visit: Payer: Self-pay | Admitting: Family Medicine

## 2023-06-29 DIAGNOSIS — I10 Essential (primary) hypertension: Secondary | ICD-10-CM

## 2023-07-01 ENCOUNTER — Ambulatory Visit: Payer: Medicare PPO | Admitting: Family Medicine

## 2023-07-01 ENCOUNTER — Encounter: Payer: Self-pay | Admitting: Family Medicine

## 2023-07-01 VITALS — BP 128/76 | HR 97 | Resp 16 | Ht 69.0 in | Wt 227.2 lb

## 2023-07-01 DIAGNOSIS — K219 Gastro-esophageal reflux disease without esophagitis: Secondary | ICD-10-CM

## 2023-07-01 DIAGNOSIS — E1140 Type 2 diabetes mellitus with diabetic neuropathy, unspecified: Secondary | ICD-10-CM | POA: Diagnosis not present

## 2023-07-01 DIAGNOSIS — J302 Other seasonal allergic rhinitis: Secondary | ICD-10-CM

## 2023-07-01 DIAGNOSIS — E785 Hyperlipidemia, unspecified: Secondary | ICD-10-CM

## 2023-07-01 DIAGNOSIS — J3089 Other allergic rhinitis: Secondary | ICD-10-CM

## 2023-07-01 DIAGNOSIS — I81 Portal vein thrombosis: Secondary | ICD-10-CM

## 2023-07-01 DIAGNOSIS — D6851 Activated protein C resistance: Secondary | ICD-10-CM | POA: Diagnosis not present

## 2023-07-01 DIAGNOSIS — Z7984 Long term (current) use of oral hypoglycemic drugs: Secondary | ICD-10-CM

## 2023-07-01 DIAGNOSIS — E119 Type 2 diabetes mellitus without complications: Secondary | ICD-10-CM

## 2023-07-01 DIAGNOSIS — Z7985 Long-term (current) use of injectable non-insulin antidiabetic drugs: Secondary | ICD-10-CM

## 2023-07-01 DIAGNOSIS — I10 Essential (primary) hypertension: Secondary | ICD-10-CM

## 2023-07-01 DIAGNOSIS — E1159 Type 2 diabetes mellitus with other circulatory complications: Secondary | ICD-10-CM

## 2023-07-01 LAB — POCT GLYCOSYLATED HEMOGLOBIN (HGB A1C): Hemoglobin A1C: 6.2 % — AB (ref 4.0–5.6)

## 2023-07-01 MED ORDER — MONTELUKAST SODIUM 10 MG PO TABS: 10.0000 mg | ORAL_TABLET | Freq: Every day | ORAL | 1 refills | Status: DC

## 2023-07-01 MED ORDER — LEVOCETIRIZINE DIHYDROCHLORIDE 5 MG PO TABS: 5.0000 mg | ORAL_TABLET | Freq: Every day | ORAL | 1 refills | Status: DC

## 2023-07-01 MED ORDER — OLMESARTAN MEDOXOMIL-HCTZ 20-12.5 MG PO TABS: 1.0000 | ORAL_TABLET | Freq: Every day | ORAL | 1 refills | Status: DC

## 2023-07-01 MED ORDER — TIRZEPATIDE 10 MG/0.5ML ~~LOC~~ SOAJ: 10.0000 mg | SUBCUTANEOUS | 1 refills | Status: DC

## 2023-07-01 MED ORDER — PREGABALIN 200 MG PO CAPS: 200.0000 mg | ORAL_CAPSULE | Freq: Every day | ORAL | 1 refills | Status: DC

## 2023-07-01 MED ORDER — PREGABALIN 100 MG PO CAPS: 100.0000 mg | ORAL_CAPSULE | Freq: Three times a day (TID) | ORAL | 1 refills | Status: DC

## 2023-07-01 MED ORDER — METFORMIN HCL ER 500 MG PO TB24: 500.0000 mg | ORAL_TABLET | Freq: Every day | ORAL | 1 refills | Status: DC

## 2023-07-01 NOTE — Progress Notes (Signed)
 Name: Emily Pitts   MRN: [id number]    DOB: 1956/03/08   Date:07/01/2023       Progress Note  Subjective  Chief Complaint  Chief Complaint  Patient presents with   Medical Management of Chronic Issues   Discussed the use of AI scribe software for clinical note transcription with the patient, who gave verbal consent to proceed.  History of Present Illness Emily Pitts is a 68 year old female with diabetes who presents for a four-month follow-up.  She has been managing her diabetes with Mounjaro 7.5 mg for the past four months, having switched from Rybelsus  in December. Her weight has decreased from 234 lbs in February to 227 lbs today. She experiences occasional constipation as a side effect of Mounjaro. Her A1c has improved to 6.2%. She continues to take metformin , currently at 750 mg. No significant symptoms of diabetes such as excessive thirst or frequent urination, although she drinks water constantly and sometimes wakes up hungry.  She has diabetic neuropathy and takes Lyrica  (pregabalin ) 100 mg twice daily and 200 mg at bedtime. She notices more pain in her feet when on them for extended periods, especially at night.  She has a history of dyslipidemia with low HDL levels, previously at 47. She reports eating more peanuts and playing badminton, but not consuming fish.  She has a history of Factor V Leiden mutation and takes Xarelto  for anticoagulation. She also has a history of low TSH and takes thyroid  medication. She reports no recent issues with her thyroid  management.  She experiences seasonal allergies and takes montelukast , Xyzal , and occasionally Flonase  and Astelin . She reports manageable symptoms this year, primarily runny nose and itchy eyes.  She has a history of mild obstructive sleep apnea but does not require treatment. She sleeps on her side and reports no significant issues.  She has experienced a rash on her arm, which was itchy but has improved with over-the-counter  hydrocortisone cream. She suspects it may be related to contact dermatitis from yard work or her dogs.    Patient Active Problem List   Diagnosis Date Noted   Low serum vitamin B12 05/19/2022   Type 2 diabetes mellitus with microalbuminuria, without long-term current use of insulin  (HCC) 05/19/2022   Hypertension associated with diabetes (HCC) 02/12/2022   Iron  deficiency anemia 02/14/2018   Chondromalacia, right knee 10/30/2016   Long term current use of anticoagulant therapy 01/05/2016   Factor 5 Leiden mutation, heterozygous (HCC) 05/02/2015   Portal vein thrombosis 11/07/2014   History of sepsis 10/10/2014   Abnormal electrocardiogram 10/08/2014   Nonspecific abnormal electromyogram (EMG) 10/08/2014   Benign essential HTN 10/08/2014   Controlled type 2 diabetes with neuropathy (HCC) 10/08/2014   Dyslipidemia 10/08/2014   Low back pain 10/08/2014   Gastro-esophageal reflux disease without esophagitis 10/08/2014   Microalbuminuria 10/08/2014   Disturbance of skin sensation 10/08/2014   Obesity (BMI 30-39.9) 10/08/2014   Perennial allergic rhinitis with seasonal variation 10/08/2014   Plantar fasciitis 10/08/2014   Postablative ovarian failure 10/08/2014   Menopausal symptom 10/08/2014   Vitamin D  deficiency 10/08/2014    Past Surgical History:  Procedure Laterality Date   ABDOMINAL HYSTERECTOMY  2010   APPENDECTOMY  1966   COLONOSCOPY  06-07-13   Dr Marquita Situ   DILATION AND CURETTAGE OF UTERUS     OOPHORECTOMY      Family History  Problem Relation Age of Onset   Diabetes Mother    COPD Mother    Diabetes Father  Hypertension Father    Cancer Father        Kidney and Prostate   CAD Father    Diabetes Brother        Oldest Brother   Kidney disease Brother        Tumor removed   Breast cancer Neg Hx     Social History   Tobacco Use   Smoking status: Never   Smokeless tobacco: Never  Substance Use Topics   Alcohol use: No    Alcohol/week: 0.0 standard  drinks of alcohol     Current Outpatient Medications:    atorvastatin  (LIPITOR) 40 MG tablet, Take 1 tablet (40 mg total) by mouth daily., Disp: 90 tablet, Rfl: 3   azelastine  (ASTELIN ) 0.1 % nasal spray, Place 2 sprays into both nostrils 2 (two) times daily. Use in each nostril as directed, Disp: 30 mL, Rfl: 2   cholecalciferol (VITAMIN D ) 1000 units tablet, Take 500 Units by mouth daily. , Disp: , Rfl:    fluticasone  (FLONASE ) 50 MCG/ACT nasal spray, USE 2 SPRAY(S) IN EACH NOSTRIL AS NEEDED, Disp: 48 g, Rfl: 0   glucose blood (ONE TOUCH ULTRA TEST) test strip, USE AS DIRECTED, Disp: 100 each, Rfl: 12   Iron -FA-B Cmp-C-Biot-Probiotic (FUSION PLUS PO), Take 1 tablet by mouth daily. Vitamin with iron , Disp: , Rfl:    levocetirizine (XYZAL ) 5 MG tablet, Take 1 tablet by mouth once daily, Disp: 90 tablet, Rfl: 0   MAGNESIUM  OXIDE PO, Take 500 mg by mouth daily. , Disp: , Rfl:    metFORMIN  (GLUCOPHAGE -XR) 750 MG 24 hr tablet, Take 1 tablet (750 mg total) by mouth daily with breakfast., Disp: 90 tablet, Rfl: 1   montelukast  (SINGULAIR ) 10 MG tablet, TAKE 1 TABLET BY MOUTH AT BEDTIME, Disp: 90 tablet, Rfl: 0   olmesartan -hydrochlorothiazide  (BENICAR  HCT) 20-12.5 MG tablet, Take 1 tablet by mouth once daily, Disp: 30 tablet, Rfl: 0   polyethylene glycol powder (GLYCOLAX /MIRALAX ) powder, Take 17 g by mouth 2 (two) times daily., Disp: 3350 g, Rfl: 1   pregabalin  (LYRICA ) 100 MG capsule, Take 1 capsule (100 mg total) by mouth 3 (three) times daily., Disp: 270 capsule, Rfl: 1   pregabalin  (LYRICA ) 200 MG capsule, Take 1 capsule by mouth at bedtime, Disp: 90 capsule, Rfl: 0   rivaroxaban  (XARELTO ) 20 MG TABS tablet, TAKE 1 TABLET BY MOUTH ONCE DAILY WITH SUPPER, Disp: 90 tablet, Rfl: 3   tirzepatide (MOUNJARO) 7.5 MG/0.5ML Pen, Inject 7.5 mg into the skin once a week., Disp: 6 mL, Rfl: 0   vitamin B-12 (CYANOCOBALAMIN ) 500 MCG tablet, Take 1,000 mcg by mouth daily., Disp: , Rfl:    Turmeric Curcumin 500 MG  CAPS, , Disp: , Rfl:   No Known Allergies  I personally reviewed active problem list, medication list, allergies, family history with the patient/caregiver today.   ROS  Ten systems reviewed and is negative except as mentioned in HPI    Objective Physical Exam  Constitutional: Patient appears well-developed and well-nourished. Obese  No distress.  HEENT: head atraumatic, normocephalic, pupils equal and reactive to light,, neck supplelimits Cardiovascular: Normal rate, regular rhythm and normal heart sounds.  No murmur heard. No BLE edema. Pulmonary/Chest: Effort normal and breath sounds normal. No respiratory distress. Abdominal: Soft.  There is no tenderness. Skin: excoriation on left arm, no signs of infection, no blisters Psychiatric: Patient has a normal mood and affect. behavior is normal. Judgment and thought content normal.   Vitals:   07/01/23 0920  BP: 128/76  Pulse: 97  Resp: 16  SpO2: 98%  Weight: 227 lb 3.2 oz (103.1 kg)  Height: 5\' 9"  (1.753 m)    Body mass index is 33.55 kg/m.  No results found for this or any previous visit (from the past 2160 hours).  Diabetic Foot Exam:     PHQ2/9:    07/01/2023    9:19 AM 04/29/2023    3:20 PM 04/27/2023    8:38 AM 02/23/2023    7:44 AM 10/26/2022   11:41 AM  Depression screen PHQ 2/9  Decreased Interest 0 0 0 0 0  Down, Depressed, Hopeless 0 0 0 0 0  PHQ - 2 Score 0 0 0 0 0  Altered sleeping 0 0 0 0   Tired, decreased energy 0 0 0 0   Change in appetite 0 0 0 0   Feeling bad or failure about yourself  0 0 0 0   Trouble concentrating 0 0 0 0   Moving slowly or fidgety/restless 0 0 0 0   Suicidal thoughts 0 0 0 0   PHQ-9 Score 0 0 0 0   Difficult doing work/chores Not difficult at all Not difficult at all Not difficult at all Not difficult at all     phq 9 is negative  Fall Risk:    07/01/2023    9:14 AM 04/29/2023    3:23 PM 04/27/2023    8:38 AM 04/25/2023    1:02 PM 02/23/2023    7:36 AM  Fall Risk    Falls in the past year? 0 0 0 0 0  Number falls in past yr: 0 0 0  0  Injury with Fall? 0 0 0  0  Risk for fall due to : No Fall Risks No Fall Risks No Fall Risks  No Fall Risks  Follow up Falls prevention discussed;Education provided;Falls evaluation completed Falls prevention discussed;Falls evaluation completed Falls prevention discussed;Education provided;Falls evaluation completed  Falls prevention discussed;Education provided;Falls evaluation completed      Assessment & Plan Type 2 diabetes mellitus with neuropathy Diabetes well-controlled with A1c 6.2%. Neuropathy symptoms include increased pain at night and after prolonged standing. She desires to discontinue metformin . - Increase Mounjaro to 10 mg. - Reduce metformin  to 500 mg with goal of eventual discontinuation. - Provide 47-month supply of metformin  500 mg. - Adjust Lyrica  dosing to 100 mg in the morning, 100 mg at noon, and 300 mg at night. - Provide prescriptions for Lyrica  100 mg and 200 mg.  Hypertension Blood pressure well-controlled at 128/76 mmHg with current medication regimen. - Continue Benicar  HCTZ 20/12.5 mg daily. - Provide 90-day supply of Benicar  HCTZ.  Dyslipidemia HDL slightly low at 47 mg/dL. Lifestyle modifications encouraged to improve HDL levels. - Encourage consumption of tree nuts and fish. - Promote regular exercise, including activities like badminton and hiking.  Factor V Leiden mutation with thrombosis Heterozygous for Factor V Leiden mutation with thrombosis. On lifelong anticoagulation with Xarelto . - Continue Xarelto  as prescribed.  Obstructive sleep apnea, mild Mild obstructive sleep apnea diagnosed, no CPAP required. Weight loss and positional therapy recommended. - Encourage weight loss and avoiding sleeping on the back.  Subclinical hyperthyroidism TSH levels previously suppressed at 0.3. Plan to recheck thyroid  levels at next visit to determine need for endocrinology referral. -  Recheck thyroid  levels at next visit.  Allergic rhinitis Symptoms manageable with current medication regimen. Seasonal allergies controlled. - Continue montelukast , Xyzal , and Flonase  as needed. - Provide prescriptions for montelukast   and Xyzal .

## 2023-07-08 ENCOUNTER — Ambulatory Visit: Admitting: Dermatology

## 2023-07-20 ENCOUNTER — Ambulatory Visit
Admission: RE | Admit: 2023-07-20 | Discharge: 2023-07-20 | Disposition: A | Discharge status: Home / Self Care | Payer: Medicare PPO | Attending: Gastroenterology | Admitting: Gastroenterology

## 2023-07-20 ENCOUNTER — Encounter
Admission: RE | Disposition: A | Discharge status: Home / Self Care | Payer: Self-pay | Source: Home / Self Care | Attending: Gastroenterology

## 2023-07-20 ENCOUNTER — Ambulatory Visit: Admitting: General Practice

## 2023-07-20 ENCOUNTER — Encounter: Payer: Self-pay | Admitting: Gastroenterology

## 2023-07-20 DIAGNOSIS — K635 Polyp of colon: Secondary | ICD-10-CM

## 2023-07-20 DIAGNOSIS — I10 Essential (primary) hypertension: Secondary | ICD-10-CM | POA: Diagnosis not present

## 2023-07-20 DIAGNOSIS — K5730 Diverticulosis of large intestine without perforation or abscess without bleeding: Secondary | ICD-10-CM | POA: Insufficient documentation

## 2023-07-20 DIAGNOSIS — D124 Benign neoplasm of descending colon: Secondary | ICD-10-CM | POA: Diagnosis not present

## 2023-07-20 DIAGNOSIS — Z7984 Long term (current) use of oral hypoglycemic drugs: Secondary | ICD-10-CM | POA: Insufficient documentation

## 2023-07-20 DIAGNOSIS — D1779 Benign lipomatous neoplasm of other sites: Secondary | ICD-10-CM | POA: Insufficient documentation

## 2023-07-20 DIAGNOSIS — Z860100 Personal history of colon polyps, unspecified: Secondary | ICD-10-CM

## 2023-07-20 DIAGNOSIS — Z7985 Long-term (current) use of injectable non-insulin antidiabetic drugs: Secondary | ICD-10-CM | POA: Insufficient documentation

## 2023-07-20 DIAGNOSIS — K922 Gastrointestinal hemorrhage, unspecified: Secondary | ICD-10-CM

## 2023-07-20 DIAGNOSIS — D175 Benign lipomatous neoplasm of intra-abdominal organs: Secondary | ICD-10-CM

## 2023-07-20 DIAGNOSIS — E119 Type 2 diabetes mellitus without complications: Secondary | ICD-10-CM | POA: Diagnosis not present

## 2023-07-20 DIAGNOSIS — Z1211 Encounter for screening for malignant neoplasm of colon: Secondary | ICD-10-CM | POA: Insufficient documentation

## 2023-07-20 LAB — GLUCOSE, CAPILLARY: Glucose-Capillary: 117 mg/dL — ABNORMAL HIGH (ref 70–99)

## 2023-07-20 SURGERY — COLONOSCOPY WITH PROPOFOL
Anesthesia: General

## 2023-07-20 MED ORDER — SODIUM CHLORIDE 0.9 % IV SOLN: INTRAVENOUS | Status: DC

## 2023-07-20 MED ORDER — PROPOFOL 1000 MG/100ML IV EMUL
INTRAVENOUS | Status: AC
  Filled 2023-07-20: qty 100

## 2023-07-20 MED ORDER — PROPOFOL 500 MG/50ML IV EMUL
INTRAVENOUS | Status: DC | PRN
  Administered 2023-07-20: 200 ug/kg/min via INTRAVENOUS
  Administered 2023-07-20: 100 mg via INTRAVENOUS

## 2023-07-20 MED ORDER — LIDOCAINE HCL (PF) 2 % IJ SOLN
INTRAMUSCULAR | Status: AC
  Filled 2023-07-20: qty 5

## 2023-07-20 MED ORDER — PHENYLEPHRINE 80 MCG/ML (10ML) SYRINGE FOR IV PUSH (FOR BLOOD PRESSURE SUPPORT)
PREFILLED_SYRINGE | INTRAVENOUS | Status: AC
Start: 2023-07-20 — End: ?
  Filled 2023-07-20: qty 10

## 2023-07-20 MED ORDER — GLYCOPYRROLATE 0.2 MG/ML IJ SOLN
INTRAMUSCULAR | Status: AC
  Filled 2023-07-20: qty 1

## 2023-07-20 NOTE — Anesthesia Postprocedure Evaluation (Signed)
 Anesthesia Post Note  Patient: Emily Pitts  Procedure(s) Performed: COLONOSCOPY WITH PROPOFOL  Patient location during evaluation: Endoscopy Anesthesia Type: General Level of consciousness: awake and alert Pain management: pain level controlled Vital Signs Assessment: post-procedure vital signs reviewed and stable Respiratory status: spontaneous breathing, nonlabored ventilation, respiratory function stable and patient connected to nasal cannula oxygen Cardiovascular status: blood pressure returned to baseline and stable Postop Assessment: no apparent nausea or vomiting Anesthetic complications: no  There were no known notable events for this encounter.   Last Vitals:  Vitals:   07/20/23 0906 07/20/23 0916  BP: 104/64 127/71  Pulse: 84 78  Resp: 16 17  Temp:    SpO2: 100% 99%    Last Pain:  Vitals:   07/20/23 0916  TempSrc:   PainSc: 0-No pain                 Enrique Harvest

## 2023-07-20 NOTE — Op Note (Signed)
 [facility] Gastroenterology Patient Name: Emily Pitts Procedure Date: 07/20/2023 8:35 AM MRN: [id number] Account #: [id number] Date of Birth: 1955-11-28 Admit Type: Outpatient Age: 68 Room: [facility] ENDO ROOM 4 Gender: Female Note Status: Finalized Instrument Name: Hyman Main [id number] Procedure:             Colonoscopy Indications:           Screening for colorectal malignant neoplasm Providers:             Marnee Sink MD, MD Referring MD:          Lavonna Prader. Sowles, MD (Referring MD) Medicines:             Propofol per Anesthesia Complications:         No immediate complications. Procedure:             Pre-Anesthesia Assessment:                        - Prior to the procedure, a History and Physical was                         performed, and patient medications and allergies were                         reviewed. The patient's tolerance of previous                         anesthesia was also reviewed. The risks and benefits                         of the procedure and the sedation options and risks                         were discussed with the patient. All questions were                         answered, and informed consent was obtained. Prior                         Anticoagulants: The patient has taken no anticoagulant                         or antiplatelet agents. ASA Grade Assessment: II - A                         patient with mild systemic disease. After reviewing                         the risks and benefits, the patient was deemed in                         satisfactory condition to undergo the procedure.                        After obtaining informed consent, the colonoscope was                         passed under direct vision. Throughout the procedure,  the patient's blood pressure, pulse, and oxygen                         saturations were monitored continuously. The                         Colonoscope was introduced through  the anus and                         advanced to the the cecum, identified by appendiceal                         orifice and ileocecal valve. The colonoscopy was                         performed without difficulty. The patient tolerated                         the procedure well. The quality of the bowel                         preparation was excellent. Findings:      The perianal and digital rectal examinations were normal.      A 5 mm polyp was found in the descending colon. The polyp was sessile.       The polyp was removed with a cold snare. Resection and retrieval were       complete.      There was a medium-sized lipoma, at the hepatic flexure.      A segmental area of erythematous mucosa was found in the sigmoid colon.       This was biopsied with a cold forceps for histology.      Multiple small-mouthed diverticula were found in the sigmoid colon. Impression:            - One 5 mm polyp in the descending colon, removed with                         a cold snare. Resected and retrieved.                        - Medium-sized lipoma at the hepatic flexure.                        - Erythematous mucosa in the sigmoid colon. Biopsied.                        - Diverticulosis in the sigmoid colon. Recommendation:        - Discharge patient to home.                        - Resume previous diet.                        - Continue present medications.                        - Await pathology results. Procedure Code(s):     --- Professional ---  09811, Colonoscopy, flexible; with removal of                         tumor(s), polyp(s), or other lesion(s) by snare                         technique                        45380, 59, Colonoscopy, flexible; with biopsy, single                         or multiple Diagnosis Code(s):     --- Professional ---                        Z12.11, Encounter for screening for malignant neoplasm                         of colon                         D12.4, Benign neoplasm of descending colon CPT copyright 2022 American Medical Association. All rights reserved. The codes documented in this report are preliminary and upon coder review may  be revised to meet current compliance requirements. Marnee Sink MD, MD 07/20/2023 8:59:27 AM This report has been signed electronically. Number of Addenda: 0 Note Initiated On: 07/20/2023 8:35 AM Scope Withdrawal Time: 0 hours 7 minutes 42 seconds  Total Procedure Duration: 0 hours 11 minutes 30 seconds  Estimated Blood Loss:  Estimated blood loss: none. Estimated blood loss: none.      [facility]

## 2023-07-20 NOTE — H&P (Signed)
 Emily Sink, MD [facility] [address], [address] [place], [place] [address] Phone: [phone] Fax : [phone]  Primary Care Physician:  Arleen Lacer, MD Primary Gastroenterologist:  Dr. Ole Berkeley  Pre-Procedure History & Physical: HPI:  Emily Pitts is a 68 y.o. female is here for a screening colonoscopy.   Past Medical History:  Diagnosis Date   Allergy    Diabetes mellitus without complication (HCC) 2012   Hyperlipidemia    Hypertension    Low back pain, episodic    Numbness of feet    Vitamin D  deficiency     Past Surgical History:  Procedure Laterality Date   ABDOMINAL HYSTERECTOMY  2010   APPENDECTOMY  1966   COLONOSCOPY  06-07-13   Dr Marquita Situ   DILATION AND CURETTAGE OF UTERUS     OOPHORECTOMY      Prior to Admission medications   Medication Sig Start Date End Date Taking? Authorizing Provider  atorvastatin  (LIPITOR) 40 MG tablet Take 1 tablet (40 mg total) by mouth daily. 02/23/23  Yes Sowles, Krichna, MD  metFORMIN  (GLUCOPHAGE -XR) 500 MG 24 hr tablet Take 1 tablet (500 mg total) by mouth daily with breakfast. 07/01/23  Yes Sowles, Krichna, MD  olmesartan -hydrochlorothiazide  (BENICAR  HCT) 20-12.5 MG tablet Take 1 tablet by mouth daily. 07/01/23  Yes Sowles, Krichna, MD  pregabalin  (LYRICA ) 100 MG capsule Take 1 capsule (100 mg total) by mouth 3 (three) times daily. 07/01/23  Yes Sowles, Krichna, MD  Turmeric Curcumin 500 MG CAPS  04/04/23  Yes [provider]  vitamin B-12 (CYANOCOBALAMIN ) 500 MCG tablet Take 1,000 mcg by mouth daily.   Yes [provider]  azelastine  (ASTELIN ) 0.1 % nasal spray Place 2 sprays into both nostrils 2 (two) times daily. Use in each nostril as directed 07/02/20   Sowles, Krichna, MD  cholecalciferol (VITAMIN D ) 1000 units tablet Take 500 Units by mouth daily.     [provider]  fluticasone  (FLONASE ) 50 MCG/ACT nasal spray USE 2 SPRAY(S) IN EACH NOSTRIL AS NEEDED 05/23/20   Ava Lei, Krichna, MD  glucose blood (ONE  TOUCH ULTRA TEST) test strip USE AS DIRECTED 02/07/18   Ava Lei, Krichna, MD  Iron -FA-B Cmp-C-Biot-Probiotic (FUSION PLUS PO) Take 1 tablet by mouth daily. Vitamin with iron     [provider]  levocetirizine (XYZAL ) 5 MG tablet Take 1 tablet (5 mg total) by mouth daily. 07/01/23   Sowles, Krichna, MD  MAGNESIUM  OXIDE PO Take 500 mg by mouth daily.     [provider]  montelukast  (SINGULAIR ) 10 MG tablet Take 1 tablet (10 mg total) by mouth at bedtime. 07/01/23   Sowles, Krichna, MD  polyethylene glycol powder (GLYCOLAX /MIRALAX ) powder Take 17 g by mouth 2 (two) times daily. 02/26/17   Sowles, Krichna, MD  pregabalin  (LYRICA ) 200 MG capsule Take 1 capsule (200 mg total) by mouth at bedtime. 07/01/23   Sowles, Krichna, MD  rivaroxaban  (XARELTO ) 20 MG TABS tablet TAKE 1 TABLET BY MOUTH ONCE DAILY WITH SUPPER 06/28/23   Sowles, Krichna, MD  tirzepatide [facility]) 10 MG/0.5ML Pen Inject 10 mg into the skin once a week. 07/01/23   Sowles, Krichna, MD    Allergies as of 05/07/2023   (No Known Allergies)    Family History  Problem Relation Age of Onset   Diabetes Mother    COPD Mother    Diabetes Father    Hypertension Father    Cancer Father        Kidney and Prostate   CAD Father  Diabetes Brother        Oldest Brother   Kidney disease Brother        Tumor removed   Breast cancer Neg Hx     Social History   Socioeconomic History   Marital status: Single    Spouse name: Not on file   Number of children: 0   Years of education: Not on file   Highest education level: 12th grade  Occupational History   Not on file  Tobacco Use   Smoking status: Never   Smokeless tobacco: Never  Vaping Use   Vaping status: Never Used  Substance and Sexual Activity   Alcohol use: No    Alcohol/week: 0.0 standard drinks of alcohol   Drug use: No   Sexual activity: Never  Other Topics Concern   Not on file  Social History Narrative   Not on file   Social Drivers of Health    Financial Resource Strain: Low Risk  (04/29/2023)   Overall Financial Resource Strain (CARDIA)    Difficulty of Paying Living Expenses: Not hard at all  Food Insecurity: No Food Insecurity (04/29/2023)   Hunger Vital Sign    Worried About Running Out of Food in the Last Year: Never true    Ran Out of Food in the Last Year: Never true  Transportation Needs: No Transportation Needs (04/29/2023)   PRAPARE - Administrator, Civil Service (Medical): No    Lack of Transportation (Non-Medical): No  Physical Activity: Inactive (04/29/2023)   Exercise Vital Sign    Days of Exercise per Week: 0 days    Minutes of Exercise per Session: 0 min  Stress: No Stress Concern Present (04/29/2023)   [employer] of Occupational Health - Occupational Stress Questionnaire    Feeling of Stress : Not at all  Social Connections: Moderately Integrated (04/29/2023)   Social Connection and Isolation Panel [NHANES]    Frequency of Communication with Friends and Family: More than three times a week    Frequency of Social Gatherings with Friends and Family: Twice a week    Attends Religious Services: More than 4 times per year    Active Member of [employer] or Organizations: Yes    Attends Banker Meetings: More than 4 times per year    Marital Status: Never married  Intimate Partner Violence: Not At Risk (04/29/2023)   Humiliation, Afraid, Rape, and Kick questionnaire    Fear of Current or Ex-Partner: No    Emotionally Abused: No    Physically Abused: No    Sexually Abused: No    Review of Systems: See HPI, otherwise negative ROS  Physical Exam: BP (!) 146/76   Pulse 88   Temp (!) 97 F (36.1 C) (Temporal)   Resp 16   Wt 101.5 kg   SpO2 100%   BMI 33.05 kg/m  General:   Alert,  pleasant and cooperative in NAD Head:  Normocephalic and atraumatic. Neck:  Supple; no masses or thyromegaly. Lungs:  Clear throughout to auscultation.    Heart:  Regular rate and rhythm. Abdomen:   Soft, nontender and nondistended. Normal bowel sounds, without guarding, and without rebound.   Neurologic:  Alert and  oriented x4;  grossly normal neurologically.  Impression/Plan: Emily Pitts is now here to undergo a screening colonoscopy.  Risks, benefits, and alternatives regarding colonoscopy have been reviewed with the patient.  Questions have been answered.  All parties agreeable.

## 2023-07-20 NOTE — Transfer of Care (Signed)
 Immediate Anesthesia Transfer of Care Note  Patient: Emily Pitts  Procedure(s) Performed: COLONOSCOPY WITH PROPOFOL  Patient Location: Endoscopy Unit  Anesthesia Type:General  Level of Consciousness: awake  Airway & Oxygen Therapy: Patient Spontanous Breathing  Post-op Assessment: Report given to RN and Post -op Vital signs reviewed and stable  Post vital signs: Reviewed and stable  Last Vitals:  Vitals Value Taken Time  BP    Temp 36.1 C 07/20/23 0856  Pulse    Resp    SpO2      Last Pain:  Vitals:   07/20/23 0856  TempSrc: Temporal  PainSc: 0-No pain         Complications: There were no known notable events for this encounter.

## 2023-07-20 NOTE — Anesthesia Preprocedure Evaluation (Signed)
 Anesthesia Evaluation  Patient identified by MRN, date of birth, ID band Patient awake    Reviewed: Allergy & Precautions, NPO status , Patient's Chart, lab work & pertinent test results  Airway Mallampati: III  TM Distance: >3 FB Neck ROM: full    Dental  (+) Chipped   Pulmonary neg pulmonary ROS   Pulmonary exam normal        Cardiovascular hypertension, negative cardio ROS Normal cardiovascular exam     Neuro/Psych negative neurological ROS  negative psych ROS   GI/Hepatic Neg liver ROS,GERD  Medicated,,  Endo/Other  negative endocrine ROSdiabetes    Renal/GU negative Renal ROS  negative genitourinary   Musculoskeletal   Abdominal   Peds  Hematology negative hematology ROS (+)   Anesthesia Other Findings Past Medical History: No date: Allergy 2012: Diabetes mellitus without complication (HCC) No date: Hyperlipidemia No date: Hypertension No date: Low back pain, episodic No date: Numbness of feet No date: Vitamin D  deficiency  Past Surgical History: 2010: ABDOMINAL HYSTERECTOMY 1966: APPENDECTOMY 06-07-13: COLONOSCOPY     Comment:  Dr Marquita Situ No date: DILATION AND CURETTAGE OF UTERUS No date: OOPHORECTOMY  BMI    Body Mass Index: 33.05 kg/m      Reproductive/Obstetrics negative OB ROS                             Anesthesia Physical Anesthesia Plan  ASA: 2  Anesthesia Plan: General   Post-op Pain Management: Minimal or no pain anticipated   Induction: Intravenous  PONV Risk Score and Plan: 3 and Propofol infusion, TIVA and Ondansetron   Airway Management Planned: Nasal Cannula  Additional Equipment: None  Intra-op Plan:   Post-operative Plan:   Informed Consent: I have reviewed the patients History and Physical, chart, labs and discussed the procedure including the risks, benefits and alternatives for the proposed anesthesia with the patient or authorized  representative who has indicated his/her understanding and acceptance.     Dental advisory given  Plan Discussed with: CRNA and Surgeon  Anesthesia Plan Comments: (Discussed risks of anesthesia with patient, including possibility of difficulty with spontaneous ventilation under anesthesia necessitating airway intervention, PONV, and rare risks such as cardiac or respiratory or neurological events, and allergic reactions. Discussed the role of CRNA in patient's perioperative care. Patient understands.)       Anesthesia Quick Evaluation

## 2023-07-21 LAB — SURGICAL PATHOLOGY

## 2023-07-22 ENCOUNTER — Ambulatory Visit: Payer: Self-pay | Admitting: Gastroenterology

## 2023-07-30 LAB — HM DIABETES EYE EXAM

## 2023-11-01 ENCOUNTER — Ambulatory Visit: Admitting: Family Medicine

## 2023-11-03 ENCOUNTER — Ambulatory Visit: Admitting: Family Medicine

## 2023-11-03 ENCOUNTER — Encounter: Payer: Self-pay | Admitting: Family Medicine

## 2023-11-03 VITALS — BP 124/74 | HR 92 | Temp 97.7°F | Resp 16 | Ht 69.0 in | Wt 226.6 lb

## 2023-11-03 DIAGNOSIS — D6851 Activated protein C resistance: Secondary | ICD-10-CM

## 2023-11-03 DIAGNOSIS — J302 Other seasonal allergic rhinitis: Secondary | ICD-10-CM

## 2023-11-03 DIAGNOSIS — E538 Deficiency of other specified B group vitamins: Secondary | ICD-10-CM | POA: Diagnosis not present

## 2023-11-03 DIAGNOSIS — K219 Gastro-esophageal reflux disease without esophagitis: Secondary | ICD-10-CM | POA: Diagnosis not present

## 2023-11-03 DIAGNOSIS — E785 Hyperlipidemia, unspecified: Secondary | ICD-10-CM | POA: Diagnosis not present

## 2023-11-03 DIAGNOSIS — M546 Pain in thoracic spine: Secondary | ICD-10-CM

## 2023-11-03 DIAGNOSIS — E559 Vitamin D deficiency, unspecified: Secondary | ICD-10-CM

## 2023-11-03 DIAGNOSIS — J3089 Other allergic rhinitis: Secondary | ICD-10-CM

## 2023-11-03 DIAGNOSIS — E1140 Type 2 diabetes mellitus with diabetic neuropathy, unspecified: Secondary | ICD-10-CM

## 2023-11-03 DIAGNOSIS — I10 Essential (primary) hypertension: Secondary | ICD-10-CM | POA: Diagnosis not present

## 2023-11-03 DIAGNOSIS — I81 Portal vein thrombosis: Secondary | ICD-10-CM | POA: Diagnosis not present

## 2023-11-03 DIAGNOSIS — M545 Low back pain, unspecified: Secondary | ICD-10-CM | POA: Diagnosis not present

## 2023-11-03 LAB — POCT URINALYSIS DIPSTICK
Bilirubin, UA: NEGATIVE
Glucose, UA: NEGATIVE
Ketones, UA: NEGATIVE
Nitrite, UA: NEGATIVE
Odor: NORMAL
Protein, UA: NEGATIVE
Spec Grav, UA: 1.01 (ref 1.010–1.025)
Urobilinogen, UA: 0.2 U/dL
pH, UA: 5 (ref 5.0–8.0)

## 2023-11-03 LAB — POCT GLYCOSYLATED HEMOGLOBIN (HGB A1C): Hemoglobin A1C: 5.9 % — AB (ref 4.0–5.6)

## 2023-11-03 MED ORDER — OLMESARTAN MEDOXOMIL-HCTZ 20-12.5 MG PO TABS: 1.0000 | ORAL_TABLET | Freq: Every day | ORAL | 1 refills | Status: AC

## 2023-11-03 MED ORDER — LIDOCAINE HCL (PF) 1 % IJ SOLN
2.0000 mL | Freq: Once | INTRAMUSCULAR | Status: AC
  Administered 2023-11-03: 2 mL

## 2023-11-03 MED ORDER — TIRZEPATIDE 12.5 MG/0.5ML ~~LOC~~ SOAJ: 12.5000 mg | SUBCUTANEOUS | 1 refills | Status: AC

## 2023-11-03 MED ORDER — PREGABALIN 300 MG PO CAPS
300.0000 mg | ORAL_CAPSULE | Freq: Every day | ORAL | 1 refills | Status: DC
Start: 2023-11-03 — End: 2023-12-23

## 2023-11-03 NOTE — Progress Notes (Signed)
 Name: Emily Pitts   MRN: [id number]    DOB: 31-Aug-1955   Date:11/03/2023       Progress Note  Subjective  Chief Complaint  Chief Complaint  Patient presents with   Medical Management of Chronic Issues   Back Pain    Lower L side ongoing for weeks   Discussed the use of AI scribe software for clinical note transcription with the patient, who gave verbal consent to proceed.  History of Present Illness SMT. LODER is a 68 year old female with diabetes and neuropathy who presents with worsening back and flank pain.  She has been experiencing stabbing back and flank pain since October 19, 2023, located over the kidney area. Initially mild, the pain has progressively worsened over the past two weeks, with significant intensification in the last three days. It is intermittent, alleviating when lying down but worsening when sitting. Physical activity, such as playing badminton, does not exacerbate the pain. She denies any recent unusual activities that could have triggered the pain, although she has been participating in stretch classes for the past four months. No associated rashes, fever, chills, nausea, or vomiting. She has a history of kidney stones but denies dysuria or hematuria   She has diabetes and neuropathy, with persistent tingling and burning sensations in her feet. Despite taking pregabalin  three times a day and 200 mg at night, the symptoms have not improved, with pain levels reaching 7 or 8 at night. She has previously tried topical treatments like capsaicin but was inconsistent with their use. Her diabetes management includes metformin  and Mounjaro . Her A1c has improved from 7 in December to 5.9 recently. She reports occasional increased hunger but no significant changes in thirst or urination.   She is on Xarelto  for a history of portal vein thrombosis and reports no side effects such as bleeding.   Her hypertension is managed with olmesartan  and HCTZ, with recent blood pressure  readings being stable. No chest pain or palpitations.   She has a history of dyslipidemia, with atorvastatin  prescribed for management. Her last cholesterol check was in August of the previous year, and she is due for a follow-up.   She experiences perineal allergic rhinitis, using nasal sprays and montelukast  as needed, particularly during seasonal changes.    Patient Active Problem List   Diagnosis Date Noted   Polyp of descending colon 07/20/2023   Low serum vitamin B12 05/19/2022   Type 2 diabetes mellitus with microalbuminuria, without long-term current use of insulin  (HCC) 05/19/2022   Hypertension associated with diabetes (HCC) 02/12/2022   Iron  deficiency anemia 02/14/2018   Chondromalacia, right knee 10/30/2016   Long term current use of anticoagulant therapy 01/05/2016   Factor 5 Leiden mutation, heterozygous (HCC) 05/02/2015   Portal vein thrombosis 11/07/2014   History of sepsis 10/10/2014   Abnormal electrocardiogram 10/08/2014   Nonspecific abnormal electromyogram (EMG) 10/08/2014   Benign essential HTN 10/08/2014   Controlled type 2 diabetes with neuropathy (HCC) 10/08/2014   Dyslipidemia 10/08/2014   Low back pain 10/08/2014   Gastro-esophageal reflux disease without esophagitis 10/08/2014   Microalbuminuria 10/08/2014   Disturbance of skin sensation 10/08/2014   Obesity (BMI 30-39.9) 10/08/2014   Perennial allergic rhinitis with seasonal variation 10/08/2014   Plantar fasciitis 10/08/2014   Postablative ovarian failure 10/08/2014   Menopausal symptom 10/08/2014   Vitamin D  deficiency 10/08/2014   Special screening for malignant neoplasms, colon 05/15/2013    Past Surgical History:  Procedure Laterality Date   ABDOMINAL HYSTERECTOMY  2010   APPENDECTOMY  1966   COLONOSCOPY  06-07-13   Dr Dessa   COLONOSCOPY WITH PROPOFOL  N/A 07/20/2023   Procedure: COLONOSCOPY WITH PROPOFOL ;  Surgeon: Jinny Carmine, MD;  Location: [facility] ENDOSCOPY;  Service: Endoscopy;   Laterality: N/A;   DILATION AND CURETTAGE OF UTERUS     OOPHORECTOMY     POLYPECTOMY  07/20/2023   Procedure: POLYPECTOMY, INTESTINE;  Surgeon: Jinny Carmine, MD;  Location: [facility] ENDOSCOPY;  Service: Endoscopy;;    Family History  Problem Relation Age of Onset   Diabetes Mother    COPD Mother    Diabetes Father    Hypertension Father    Cancer Father        Kidney and Prostate   CAD Father    Kidney disease Father    Diabetes Brother        Oldest Brother   Kidney disease Brother        Tumor removed   Cancer Brother    Hearing loss Brother    Cancer Brother    Diabetes Brother    Hearing loss Brother    Hypertension Brother    Breast cancer Neg Hx     Social History   Tobacco Use   Smoking status: Never   Smokeless tobacco: Never  Substance Use Topics   Alcohol use: No    Alcohol/week: 0.0 standard drinks of alcohol     Current Outpatient Medications:    atorvastatin  (LIPITOR) 40 MG tablet, Take 1 tablet (40 mg total) by mouth daily., Disp: 90 tablet, Rfl: 3   azelastine  (ASTELIN ) 0.1 % nasal spray, Place 2 sprays into both nostrils 2 (two) times daily. Use in each nostril as directed, Disp: 30 mL, Rfl: 2   cholecalciferol (VITAMIN D ) 1000 units tablet, Take 500 Units by mouth daily. , Disp: , Rfl:    fluticasone  (FLONASE ) 50 MCG/ACT nasal spray, USE 2 SPRAY(S) IN EACH NOSTRIL AS NEEDED, Disp: 48 g, Rfl: 0   glucose blood (ONE TOUCH ULTRA TEST) test strip, USE AS DIRECTED, Disp: 100 each, Rfl: 12   Iron -FA-B Cmp-C-Biot-Probiotic (FUSION PLUS PO), Take 1 tablet by mouth daily. Vitamin with iron , Disp: , Rfl:    levocetirizine (XYZAL ) 5 MG tablet, Take 1 tablet (5 mg total) by mouth daily., Disp: 90 tablet, Rfl: 1   MAGNESIUM  OXIDE PO, Take 500 mg by mouth daily. , Disp: , Rfl:    montelukast  (SINGULAIR ) 10 MG tablet, Take 1 tablet (10 mg total) by mouth at bedtime., Disp: 90 tablet, Rfl: 1   polyethylene glycol powder (GLYCOLAX /MIRALAX ) powder, Take 17 g by mouth 2  (two) times daily., Disp: 3350 g, Rfl: 1   pregabalin  (LYRICA ) 100 MG capsule, Take 1 capsule (100 mg total) by mouth 3 (three) times daily., Disp: 270 capsule, Rfl: 1   pregabalin  (LYRICA ) 300 MG capsule, Take 1 capsule (300 mg total) by mouth at bedtime., Disp: 90 capsule, Rfl: 1   rivaroxaban  (XARELTO ) 20 MG TABS tablet, TAKE 1 TABLET BY MOUTH ONCE DAILY WITH SUPPER, Disp: 90 tablet, Rfl: 3   tirzepatide  (MOUNJARO ) 12.5 MG/0.5ML Pen, Inject 12.5 mg into the skin once a week., Disp: 6 mL, Rfl: 1   vitamin B-12 (CYANOCOBALAMIN ) 500 MCG tablet, Take 1,000 mcg by mouth daily., Disp: , Rfl:    olmesartan -hydrochlorothiazide  (BENICAR  HCT) 20-12.5 MG tablet, Take 1 tablet by mouth daily., Disp: 90 tablet, Rfl: 1  No Known Allergies  I personally reviewed active problem list, medication list, allergies with the patient/caregiver today.  ROS  Ten systems reviewed and is negative except as mentioned in HPI    Objective Physical Exam  CONSTITUTIONAL: Patient appears well-developed and well-nourished. No distress. HEENT: Head atraumatic, normocephalic, neck supple. Oral cavity normal. CARDIOVASCULAR: Normal rate, regular rhythm and normal heart sounds. No murmur heard. No BLE edema. PULMONARY: Effort normal and breath sounds normal. No respiratory distress. ABDOMINAL: There is no tenderness or distention. No costovertebral angle tenderness. MUSCULOSKELETAL: Normal gait. Without gross motor or sensory deficit. Spine non-tender. Tenderness in the right paraspinal region on thoracic area to the left side  PSYCHIATRIC: Patient has a normal mood and affect. Behavior is normal. Judgment and thought content normal.  Vitals:   11/03/23 0925  BP: 124/74  Pulse: 92  Resp: 16  Temp: 97.7 F (36.5 C)  TempSrc: Oral  SpO2: 98%  Weight: 226 lb 9.6 oz (102.8 kg)  Height: 5' 9 (1.753 m)    Body mass index is 33.46 kg/m.  Recent Results (from the past 2160 hours)  POCT urinalysis dipstick      Status: Abnormal   Collection Time: 11/03/23  9:31 AM  Result Value Ref Range   Color, UA Yellow    Clarity, UA Clear    Glucose, UA Negative Negative   Bilirubin, UA Negative    Ketones, UA Negative    Spec Grav, UA 1.010 1.010 - 1.025   Blood, UA trace    pH, UA 5.0 5.0 - 8.0   Protein, UA Negative Negative   Urobilinogen, UA 0.2 0.2 or 1.0 E.U./dL   Nitrite, UA Negative    Leukocytes, UA Small (1+) (A) Negative   Appearance yellow    Odor normal   POCT glycosylated hemoglobin (Hb A1C)     Status: Abnormal   Collection Time: 11/03/23  9:32 AM  Result Value Ref Range   Hemoglobin A1C 5.9 (A) 4.0 - 5.6 %   HbA1c POC (<> result, manual entry)     HbA1c, POC (prediabetic range)     HbA1c, POC (controlled diabetic range)       PHQ2/9:    11/03/2023    9:13 AM 07/01/2023    9:19 AM 04/29/2023    3:20 PM 04/27/2023    8:38 AM 02/23/2023    7:44 AM  Depression screen PHQ 2/9  Decreased Interest 0 0 0 0 0  Down, Depressed, Hopeless 0 0 0 0 0  PHQ - 2 Score 0 0 0 0 0  Altered sleeping  0 0 0 0  Tired, decreased energy  0 0 0 0  Change in appetite  0 0 0 0  Feeling bad or failure about yourself   0 0 0 0  Trouble concentrating  0 0 0 0  Moving slowly or fidgety/restless  0 0 0 0  Suicidal thoughts  0 0 0 0  PHQ-9 Score  0 0 0 0  Difficult doing work/chores  Not difficult at all Not difficult at all Not difficult at all Not difficult at all    phq 9 is negative  Fall Risk:    11/03/2023    9:13 AM 07/01/2023    9:14 AM 04/29/2023    3:23 PM 04/27/2023    8:38 AM 04/25/2023    1:02 PM  Fall Risk   Falls in the past year? 0 0 0 0 0  Number falls in past yr: 0 0 0 0   Injury with Fall? 0 0 0 0   Risk for fall due to : No  Fall Risks No Fall Risks No Fall Risks No Fall Risks   Follow up Falls evaluation completed Falls prevention discussed;Education provided;Falls evaluation completed Falls prevention discussed;Falls evaluation completed Falls prevention discussed;Education  provided;Falls evaluation completed     Assessment & Plan Left thoracic region paraspinal muscle pain Pain likely muscular in origin, relieved by trigger point injection. - Advise Lidoderm  patch if pain returns. - Recommend Biofreeze roll-on for topical relief. - Suggest acetaminophen  for pain management.  Consent form signed Localized muscle group Injection with lidocaine  1% on muscle  Patient tolerated procedure well No side effects   Type 2 diabetes mellitus with diabetic neuropathy Diabetes well-controlled with A1c of 5.9. Neuropathy symptoms persistent, pregabalin  dose may be insufficient. Considered metformin  discontinuation due to potential B12 deficiency. - Increase pregabalin  dose to 300 mg at bedtime. - Discontinue metformin . - Increase Mounjaro  dose to 12.5 mg after current supply is finished. - Advise use of Biofreeze or similar topical creams for neuropathy. - Check B12 levels. - Provide refills for Mounjaro  with instructions to fill before the end of the year to avoid deductible costs.  Essential hypertension Blood pressure well-controlled with olmesartan  and HCTZ.  Hyperlipidemia Cholesterol well-controlled, continuing atorvastatin . - Order blood work to check cholesterol levels.  Osteoarthritis of bilateral knees Occasional knee pain, sometimes requiring cortisone injections. - Advise use of ice and acetaminophen  for knee pain management. - Discuss potential for future cortisone injections if needed.  Allergic rhinitis (perennial and seasonal) Symptoms managed with nasal sprays and montelukast .  History of portal vein thrombosis with Factor V Leiden mutation, on chronic anticoagulation On Xarelto  without side effects, no recent clotting issues.  General Health Maintenance Discussed flu vaccination timing and follow-up schedule. - Schedule flu shot in October. - Plan follow-up visit in six months.

## 2023-11-04 ENCOUNTER — Encounter: Payer: Self-pay | Admitting: Dermatology

## 2023-11-04 ENCOUNTER — Ambulatory Visit: Payer: Medicare PPO | Admitting: Dermatology

## 2023-11-04 DIAGNOSIS — L280 Lichen simplex chronicus: Secondary | ICD-10-CM

## 2023-11-04 DIAGNOSIS — Z872 Personal history of diseases of the skin and subcutaneous tissue: Secondary | ICD-10-CM

## 2023-11-04 DIAGNOSIS — L578 Other skin changes due to chronic exposure to nonionizing radiation: Secondary | ICD-10-CM

## 2023-11-04 DIAGNOSIS — L281 Prurigo nodularis: Secondary | ICD-10-CM

## 2023-11-04 DIAGNOSIS — D2361 Other benign neoplasm of skin of right upper limb, including shoulder: Secondary | ICD-10-CM

## 2023-11-04 DIAGNOSIS — Z1283 Encounter for screening for malignant neoplasm of skin: Secondary | ICD-10-CM | POA: Diagnosis not present

## 2023-11-04 DIAGNOSIS — D229 Melanocytic nevi, unspecified: Secondary | ICD-10-CM

## 2023-11-04 DIAGNOSIS — L814 Other melanin hyperpigmentation: Secondary | ICD-10-CM | POA: Diagnosis not present

## 2023-11-04 DIAGNOSIS — L821 Other seborrheic keratosis: Secondary | ICD-10-CM

## 2023-11-04 DIAGNOSIS — D1801 Hemangioma of skin and subcutaneous tissue: Secondary | ICD-10-CM

## 2023-11-04 DIAGNOSIS — W908XXA Exposure to other nonionizing radiation, initial encounter: Secondary | ICD-10-CM

## 2023-11-04 LAB — URINE CULTURE
MICRO NUMBER:: 16892016
Result:: NO GROWTH
SPECIMEN QUALITY:: ADEQUATE

## 2023-11-04 LAB — COMPREHENSIVE METABOLIC PANEL WITH GFR
AG Ratio: 1.5 (calc) (ref 1.0–2.5)
ALT: 23 U/L (ref 6–29)
AST: 19 U/L (ref 10–35)
Albumin: 4.4 g/dL (ref 3.6–5.1)
Alkaline phosphatase (APISO): 98 U/L (ref 37–153)
BUN: 13 mg/dL (ref 7–25)
CO2: 29 mmol/L (ref 20–32)
Calcium: 10.1 mg/dL (ref 8.6–10.4)
Chloride: 100 mmol/L (ref 98–110)
Creat: 0.87 mg/dL (ref 0.50–1.05)
Globulin: 3 g/dL (ref 1.9–3.7)
Glucose, Bld: 93 mg/dL (ref 65–99)
Potassium: 4.4 mmol/L (ref 3.5–5.3)
Sodium: 139 mmol/L (ref 135–146)
Total Bilirubin: 0.6 mg/dL (ref 0.2–1.2)
Total Protein: 7.4 g/dL (ref 6.1–8.1)
eGFR: 72 mL/min/1.73m2 (ref >=60)

## 2023-11-04 LAB — B12 AND FOLATE PANEL
Folate: 19.9 ng/mL
Vitamin B-12: 1086 pg/mL (ref 200–1100)

## 2023-11-04 LAB — CBC WITH DIFFERENTIAL/PLATELET
Absolute Lymphocytes: 2837 {cells}/uL (ref 850–3900)
Absolute Monocytes: 656 {cells}/uL (ref 200–950)
Basophils Absolute: 33 {cells}/uL (ref 0–200)
Basophils Relative: 0.4 %
Eosinophils Absolute: 123 {cells}/uL (ref 15–500)
Eosinophils Relative: 1.5 %
HCT: 40.6 % (ref 35.0–45.0)
Hemoglobin: 13.5 g/dL (ref 11.7–15.5)
MCH: 28.6 pg (ref 27.0–33.0)
MCHC: 33.3 g/dL (ref 32.0–36.0)
MCV: 86 fL (ref 80.0–100.0)
MPV: 11.5 fL (ref 7.5–12.5)
Monocytes Relative: 8 %
Neutro Abs: 4551 {cells}/uL (ref 1500–7800)
Neutrophils Relative %: 55.5 %
Platelets: 194 Thousand/uL (ref 140–400)
RBC: 4.72 Million/uL (ref 3.80–5.10)
RDW: 14.7 % (ref 11.0–15.0)
Total Lymphocyte: 34.6 %
WBC: 8.2 Thousand/uL (ref 3.8–10.8)

## 2023-11-04 LAB — LIPID PANEL
Cholesterol: 130 mg/dL (ref <200)
HDL: 48 mg/dL — ABNORMAL LOW (ref >=50)
LDL Cholesterol (Calc): 55 mg/dL
Non-HDL Cholesterol (Calc): 82 mg/dL (ref <130)
Total CHOL/HDL Ratio: 2.7 (calc) (ref <5.0)
Triglycerides: 208 mg/dL — ABNORMAL HIGH (ref <150)

## 2023-11-04 LAB — EXTRA

## 2023-11-04 LAB — MICROALBUMIN / CREATININE URINE RATIO
Creatinine, Urine: 40 mg/dL (ref 20–275)
Microalb Creat Ratio: 5 mg/g{creat} (ref <30)
Microalb, Ur: 0.2 mg/dL

## 2023-11-04 LAB — VITAMIN D 25 HYDROXY (VIT D DEFICIENCY, FRACTURES): Vit D, 25-Hydroxy: 60 ng/mL (ref 30–100)

## 2023-11-04 NOTE — Progress Notes (Signed)
 Follow-Up Visit   Subjective  Emily Pitts is a 68 y.o. female who presents for the following: Skin Cancer Screening and Full Body Skin Exam hx of Aks,  check spots L infer ear picks at, L sup ear picks at  The patient presents for Total-Body Skin Exam (TBSE) for skin cancer screening and mole check. The patient has spots, moles and lesions to be evaluated, some may be new or changing and the patient may have concern these could be cancer.    The following portions of the chart were reviewed this encounter and updated as appropriate: medications, allergies, medical history  Review of Systems:  No other skin or systemic complaints except as noted in HPI or Assessment and Plan.  Objective  Well appearing patient in no apparent distress; mood and affect are within normal limits.  A full examination was performed including scalp, head, eyes, ears, nose, lips, neck, chest, axillae, abdomen, back, buttocks, bilateral upper extremities, bilateral lower extremities, hands, feet, fingers, toes, fingernails, and toenails. All findings within normal limits unless otherwise noted below.   Relevant physical exam findings are noted in the Assessment and Plan.    Assessment & Plan   SKIN CANCER SCREENING PERFORMED TODAY.  ACTINIC DAMAGE - Chronic condition, secondary to cumulative UV/sun exposure - diffuse scaly erythematous macules with underlying dyspigmentation - Recommend daily broad spectrum sunscreen SPF 30+ to sun-exposed areas, reapply every 2 hours as needed.  - Staying in the shade or wearing long sleeves, sun glasses (UVA+UVB protection) and wide brim hats (4-inch brim around the entire circumference of the hat) are also recommended for sun protection.  - Call for new or changing lesions.  LENTIGINES, SEBORRHEIC KERATOSES, HEMANGIOMAS - Benign normal skin lesions - Benign-appearing - Call for any changes - SK L temple/scalp area  MELANOCYTIC NEVI - Tan-brown and/or  pink-flesh-colored symmetric macules and papules - Benign appearing on exam today - Observation - Call clinic for new or changing moles - Recommend daily use of broad spectrum spf 30+ sunscreen to sun-exposed areas.  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 Condition is bothersome/symptomatic for patient. Currently flared.   Lichen simplex chronicus [facility]) is a persistent itchy area of thickened skin that is induced by chronic rubbing and/or scratching (chronic dermatitis).  These areas may be pink, hyperpigmented and may have excoriations and bumps (prurigo nodules-PN).  PN/LSC is commonly observed in uncontrolled atopic dermatitis and other forms of eczema, and in other itchy skin conditions (eg, insect bites, scabies).  Sometimes it is not possible to know initial cause of PN/LSC if it has been present for a long time.  It generally responds well to treatment with high potency topical steroids.  It is important to stop rubbing/scratching the area in order to break the itch-scratch-rash-itch cycle, in order for the rash to resolve.   Treatment Plan: Discussed bx vs observation pt defers bx Avoid picking/rubbing/scratching  HISTORY OF PRECANCEROUS ACTINIC KERATOSIS - site(s) of PreCancerous Actinic Keratosis clear today. - these may recur and new lesions may form requiring treatment to prevent transformation into skin cancer - observe for new or changing spots and contact [facility] for appointment if occur - photoprotection with sun protective clothing; sunglasses and broad spectrum sunscreen with SPF of at least 30 + and frequent self skin exams  recommended - yearly exams by a dermatologist recommended for persons with history of PreCancerous Actinic Keratoses    MULTIPLE BENIGN NEVI   SEBORRHEIC KERATOSES   LENTIGINES   ACTINIC ELASTOSIS   CHERRY ANGIOMA   PRURIGO NODULARIS   Return in about 1 year (around 11/03/2024) for TBSE, Hx of AKs.  I, Grayce Saunas, RMA, am acting as scribe for Boneta Sharps, MD .   Documentation: I have reviewed the above documentation for accuracy and completeness, and I agree with the above.  Boneta Sharps, MD

## 2023-11-04 NOTE — Patient Instructions (Addendum)
 Recommend daily broad spectrum sunscreen SPF 30+ to sun-exposed areas, reapply every 2 hours as needed. Call for new or changing lesions.  Staying in the shade or wearing long sleeves, sun glasses (UVA+UVB protection) and wide brim hats (4-inch brim around the entire circumference of the hat) are also recommended for sun protection.    Due to recent changes in healthcare laws, you may see results of your pathology and/or laboratory studies on MyChart before the doctors have had a chance to review them. We understand that in some cases there may be results that are confusing or concerning to you. Please understand that not all results are received at the same time and often the doctors may need to interpret multiple results in order to provide you with the best plan of care or course of treatment. Therefore, we ask that you please give us  2 business days to thoroughly review all your results before contacting the office for clarification. Should we see a critical lab result, you will be contacted sooner.   If You Need Anything After Your Visit  If you have any questions or concerns for your doctor, please call our main line at [phone] and press option 4 to reach your doctor's medical assistant. If no one answers, please leave a voicemail as directed and we will return your call as soon as possible. Messages left after 4 pm will be answered the following business day.   You may also send us  a message via MyChart. We typically respond to MyChart messages within 1-2 business days.  For prescription refills, please ask your pharmacy to contact our office. Our fax number is [phone].  If you have an urgent issue when the clinic is closed that cannot wait until the next business day, you can page your doctor at the number below.    Please note that while we do our best to be available for urgent issues outside of office hours, we are not available 24/7.   If you have an urgent issue and are  unable to reach us , you may choose to seek medical care at your doctor's office, retail clinic, urgent care center, or emergency room.  If you have a medical emergency, please immediately call 911 or go to the emergency department.  Pager Numbers  - Dr. Hester: [phone]  - Dr. Jackquline: [phone]  - Dr. Claudene: [phone]   - Dr. Raymund: [phone]  In the event of inclement weather, please call our main line at [phone] for an update on the status of any delays or closures.  Dermatology Medication Tips: Please keep the boxes that topical medications come in in order to help keep track of the instructions about where and how to use these. Pharmacies typically print the medication instructions only on the boxes and not directly on the medication tubes.   If your medication is too expensive, please contact our office at [phone] option 4 or send us  a message through MyChart.   We are unable to tell what your co-pay for medications will be in advance as this is different depending on your insurance coverage. However, we may be able to find a substitute medication at lower cost or fill out paperwork to get insurance to cover a needed medication.   If a prior authorization is required to get your medication covered by your insurance company, please allow us  1-2 business days to complete this process.  Drug prices often vary depending on where the prescription is filled and some pharmacies may offer  cheaper prices.  The website www.goodrx.com contains coupons for medications through different pharmacies. The prices here do not account for what the cost may be with help from insurance (it may be cheaper with your insurance), but the website can give you the price if you did not use any insurance.  - You can print the associated coupon and take it with your prescription to the pharmacy.  - You may also stop by our office during regular business hours and pick up a GoodRx coupon  card.  - If you need your prescription sent electronically to a different pharmacy, notify our office through [facility] or by phone at [phone] option 4.     Si Usted Necesita Algo Despus de Su Visita  Tambin puede enviarnos un mensaje a travs de Clinical cytogeneticist. Por lo general respondemos a los mensajes de MyChart en el transcurso de 1 a 2 das hbiles.  Para renovar recetas, por favor pida a su farmacia que se ponga en contacto con nuestra oficina. Randi lakes de fax es [place] [phone].  Si tiene un asunto urgente cuando la clnica est cerrada y que no puede esperar hasta el siguiente da hbil, puede llamar/localizar a su doctor(a) al nmero que aparece a continuacin.   Por favor, tenga en cuenta que aunque hacemos todo lo posible para estar disponibles para asuntos urgentes fuera del horario de [place], no estamos disponibles las 24 horas del da, los 7 [address]  [address] de la [place].   Si tiene un problema urgente y no puede comunicarse con nosotros, puede optar por buscar atencin mdica  en el consultorio de su doctor(a), en una clnica privada, en un centro de atencin urgente o en una sala de emergencias.  Si tiene Engineer, drilling, por favor llame inmediatamente al 911 o vaya a la sala de emergencias.  Nmeros de bper  - Dr. Hester: [phone]  - Dra. Jackquline: [phone]  - Dr. Claudene: [phone]  - Dra. Kitts: [phone]  En caso de inclemencias del [place], por favor llame a nuestra lnea principal al [phone] para una actualizacin sobre el estado de cualquier retraso o cierre.  Consejos para la medicacin en dermatologa: Por favor, guarde las cajas en las que vienen los medicamentos de uso tpico para ayudarle a seguir las instrucciones sobre dnde y cmo usarlos. Las farmacias generalmente imprimen las instrucciones del medicamento slo en las cajas y no directamente en los tubos del [place].   Si su medicamento es muy caro, por favor, pngase  en contacto con landry rieger llamando al [phone] y presione la opcin 4 o envenos un mensaje a travs de Clinical cytogeneticist.   No podemos decirle cul ser su copago por los medicamentos por adelantado ya que esto es diferente dependiendo de la cobertura de su seguro. Sin embargo, es posible que podamos encontrar un medicamento sustituto a Audiological scientist un formulario para que el seguro cubra el medicamento que se considera necesario.   Si se requiere una autorizacin previa para que su compaa de seguros malta su medicamento, por favor permtanos de 1 a 2 das hbiles para completar este proceso.  Los precios de los medicamentos varan con frecuencia dependiendo del Environmental consultant de dnde se surte la receta y alguna farmacias pueden ofrecer precios ms baratos.  El sitio web www.goodrx.com tiene cupones para medicamentos de Health and safety inspector. Los precios aqu no tienen en cuenta lo que podra costar con la ayuda del seguro (puede ser ms barato con su seguro), pero el sitio web puede darle el  precio si no Visual merchandiser.  - Puede imprimir el cupn correspondiente y llevarlo con su receta a la farmacia.  - Tambin puede pasar por nuestra oficina durante el horario de atencin regular y Education officer, museum una tarjeta de cupones de GoodRx.  - Si necesita que su receta se enve electrnicamente a una farmacia diferente, informe a nuestra oficina a travs de MyChart de [place] o por telfono llamando al [phone] y presione la opcin 4.

## 2023-11-06 ENCOUNTER — Ambulatory Visit: Payer: Self-pay | Admitting: Family Medicine

## 2023-12-06 ENCOUNTER — Telehealth: Payer: Self-pay | Admitting: Family Medicine

## 2023-12-06 ENCOUNTER — Encounter: Payer: Self-pay | Admitting: Family Medicine

## 2023-12-06 ENCOUNTER — Ambulatory Visit: Payer: Self-pay

## 2023-12-06 ENCOUNTER — Other Ambulatory Visit: Payer: Self-pay | Admitting: Family Medicine

## 2023-12-06 MED ORDER — TIZANIDINE HCL 2 MG PO TABS: 2.0000 mg | ORAL_TABLET | Freq: Every evening | ORAL | 0 refills | Status: AC

## 2023-12-06 NOTE — Telephone Encounter (Signed)
 Pt will try the medication before scheduling the appt

## 2023-12-06 NOTE — Telephone Encounter (Unsigned)
 Copied from CRM [id number]. Topic: Clinical - Pink Word Triage >> Dec 06, 2023 11:27 AM Shanda MATSU wrote: Patient is calling in req a refill for a med that she has not taken for almost a year, patient stated she used to take this med for muscle spasms, but they got better so she hasn't took the med, she stated that she is again having muscle spasms on her back. Patient is unaware of the name of the med.

## 2023-12-06 NOTE — Telephone Encounter (Signed)
 FYI Only or Action Required?: Action required by provider: referral request. Muscle relaxer that she has taken in the past.   Patient was last seen in primary care on 11/03/2023 by Glenard Mire, MD.  Called Nurse Triage reporting Back Pain.  Symptoms began several days ago.  Interventions attempted: OTC medications: tylenol .  Symptoms are: unchanged.  Triage Disposition: See PCP When Office is Open (Within 3 Days)  Patient/caregiver understands and will follow disposition?: No, wishes to speak with PCP  Message from Shanda MATSU sent at 12/06/2023 11:27 AM EDT  Patient is calling in req a refill for a med that she has not taken for almost a year, patient stated she used to take this med for muscle spasms, but they got better so she hasn't took the med, she stated that she is again having muscle spasms on her back. Patient is unaware of the name of the med.   Reason for Disposition  [1] MODERATE back pain (e.g., interferes with normal activities) AND [2] present > 3 days  Answer Assessment - Initial Assessment Questions 1. ONSET: When did the pain begin? (e.g., minutes, hours, days)     Couple days 2. LOCATION: Where does it hurt? (upper, mid or lower back)     Middle and lower 3. SEVERITY: How bad is the pain?  (e.g., Scale 1-10; mild, moderate, or severe)     5, tylenol  does not help 4. PATTERN: Is the pain constant? (e.g., yes, no; constant, intermittent)      constant 5. RADIATION: Does the pain shoot into your legs or somewhere else?     denies 6. CAUSE:  What do you think is causing the back pain?      Unsure, states she feels she have overextended 7. BACK OVERUSE:  Any recent lifting of heavy objects, strenuous work or exercise?     Denies injury, states that she probably overextended back 8. MEDICINES: What have you taken so far for the pain? (e.g., nothing, acetaminophen , NSAIDS)     Tylenol  does not help 9. NEUROLOGIC SYMPTOMS: Do you have any  weakness, numbness, or problems with bowel/bladder control?     denies 10. OTHER SYMPTOMS: Do you have any other symptoms? (e.g., fever, abdomen pain, burning with urination, blood in urine)       denies  Protocols used: Back Pain-A-AH

## 2023-12-06 NOTE — Progress Notes (Signed)
 Emily Pitts                                          MRN: [id number]   12/06/2023   The [facility]I Quality Team Specialist reviewed this patient medical record for the purposes of chart review for care gap closure. The following were reviewed: abstraction for care gap closure-kidney health evaluation for diabetes:eGFR  and uACR.    VBCI Quality Team

## 2023-12-06 NOTE — Telephone Encounter (Signed)
 See NT encounter for today.

## 2023-12-13 ENCOUNTER — Other Ambulatory Visit: Payer: Self-pay | Admitting: Family Medicine

## 2023-12-20 ENCOUNTER — Ambulatory Visit (INDEPENDENT_AMBULATORY_CARE_PROVIDER_SITE_OTHER)

## 2023-12-20 ENCOUNTER — Other Ambulatory Visit: Payer: Self-pay | Admitting: Family Medicine

## 2023-12-20 DIAGNOSIS — Z23 Encounter for immunization: Secondary | ICD-10-CM | POA: Diagnosis not present

## 2023-12-20 DIAGNOSIS — R6889 Other general symptoms and signs: Secondary | ICD-10-CM | POA: Diagnosis not present

## 2023-12-23 ENCOUNTER — Encounter: Payer: Self-pay | Admitting: Family Medicine

## 2023-12-23 ENCOUNTER — Other Ambulatory Visit: Payer: Self-pay | Admitting: Family Medicine

## 2023-12-23 ENCOUNTER — Other Ambulatory Visit: Payer: Self-pay

## 2023-12-23 DIAGNOSIS — E1140 Type 2 diabetes mellitus with diabetic neuropathy, unspecified: Secondary | ICD-10-CM

## 2023-12-23 MED ORDER — PREGABALIN 100 MG PO CAPS: 100.0000 mg | ORAL_CAPSULE | Freq: Two times a day (BID) | ORAL | 1 refills | Status: AC

## 2023-12-23 MED ORDER — PREGABALIN 300 MG PO CAPS: 300.0000 mg | ORAL_CAPSULE | Freq: Every day | ORAL | 1 refills | Status: AC

## 2024-01-02 ENCOUNTER — Other Ambulatory Visit: Payer: Self-pay | Admitting: Family Medicine

## 2024-01-02 DIAGNOSIS — J302 Other seasonal allergic rhinitis: Secondary | ICD-10-CM

## 2024-01-07 ENCOUNTER — Other Ambulatory Visit: Payer: Self-pay | Admitting: Family Medicine

## 2024-01-07 DIAGNOSIS — E1140 Type 2 diabetes mellitus with diabetic neuropathy, unspecified: Secondary | ICD-10-CM

## 2024-01-10 NOTE — Telephone Encounter (Signed)
 Requested medication (s) are due for refill today: yes  Requested medication (s) are on the active medication list: yes  Last refill:  12/23/23  Future visit scheduled: yes  Notes to clinic:  Unable to refill per protocol, cannot delegate.      Requested Prescriptions  Pending Prescriptions Disp Refills   pregabalin  (LYRICA ) 100 MG capsule [Pharmacy Med Name: Pregabalin  100 MG Oral Capsule] 270 capsule 0    Sig: TAKE 1 CAPSULE BY MOUTH THREE TIMES DAILY     Not Delegated - Neurology:  Anticonvulsants - Controlled - pregabalin  Failed - 01/10/2024 10:49 AM      Failed - This refill cannot be delegated      Passed - Cr in normal range and within 360 days    Creat  Date Value Ref Range Status  11/03/2023 0.87 0.50 - 1.05 mg/dL Final   Creatinine, Urine  Date Value Ref Range Status  11/03/2023 40 20 - 275 mg/dL Final         Passed - Completed PHQ-2 or PHQ-9 in the last 360 days      Passed - Valid encounter within last 12 months    Recent Outpatient Visits           2 months ago Controlled type 2 diabetes with neuropathy [facility])   [place] [facility] Glen[facility] Mire, MD   6 months ago Type 2 diabetes mellitus without complication, without long-term current use of insulin  [facility])   [place] [facility] [place], Mire, MD   8 months ago Controlled type 2 diabetes with neuropathy [facility])   [facility] Sowles, Krichna, MD       Future Appointments             In 3 months Glenard, Krichna, MD [facility], [place]   In 10 months Claudene Lehmann, MD [facility]

## 2024-01-17 ENCOUNTER — Other Ambulatory Visit: Payer: Self-pay | Admitting: Family Medicine

## 2024-01-17 DIAGNOSIS — E1140 Type 2 diabetes mellitus with diabetic neuropathy, unspecified: Secondary | ICD-10-CM

## 2024-01-18 ENCOUNTER — Other Ambulatory Visit: Payer: Self-pay | Admitting: Family Medicine

## 2024-01-18 DIAGNOSIS — J302 Other seasonal allergic rhinitis: Secondary | ICD-10-CM

## 2024-01-19 NOTE — Telephone Encounter (Signed)
 Requested medication (s) are due for refill today - no  Requested medication (s) are on the active medication list -yes  Future visit scheduled -yes  Last refill: 12/23/23 #180 1RF  Notes to clinic: non delegated Rx  Requested Prescriptions  Pending Prescriptions Disp Refills   pregabalin  (LYRICA ) 100 MG capsule [Pharmacy Med Name: Pregabalin  100 MG Oral Capsule] 270 capsule 0    Sig: TAKE 1 CAPSULE BY MOUTH THREE TIMES DAILY     Not Delegated - Neurology:  Anticonvulsants - Controlled - pregabalin  Failed - 01/19/2024  3:49 PM      Failed - This refill cannot be delegated      Passed - Cr in normal range and within 360 days    Creat  Date Value Ref Range Status  11/03/2023 0.87 0.50 - 1.05 mg/dL Final   Creatinine, Urine  Date Value Ref Range Status  11/03/2023 40 20 - 275 mg/dL Final         Passed - Completed PHQ-2 or PHQ-9 in the last 360 days      Passed - Valid encounter within last 12 months    Recent Outpatient Visits           2 months ago Controlled type 2 diabetes with neuropathy [facility])   [facility] Glen[facility] Mire, MD   6 months ago Type 2 diabetes mellitus without complication, without long-term current use of insulin  [facility])   [place] [facility] Glenard Mire, MD   8 months ago Controlled type 2 diabetes with neuropathy [facility])   [place] [facility] Glenard Mire, MD       Future Appointments             In 3 months Glenard, Krichna, MD [facility], [place]   In 9 months Claudene Lehmann, MD [place] [facility]               Requested Prescriptions  Pending Prescriptions Disp Refills   pregabalin  (LYRICA ) 100 MG capsule [Pharmacy Med Name: Pregabalin  100 MG Oral Capsule] 270 capsule 0    Sig: TAKE 1 CAPSULE BY MOUTH THREE TIMES DAILY     Not Delegated - Neurology:  Anticonvulsants - Controlled - pregabalin  Failed - 01/19/2024  3:49  PM      Failed - This refill cannot be delegated      Passed - Cr in normal range and within 360 days    Creat  Date Value Ref Range Status  11/03/2023 0.87 0.50 - 1.05 mg/dL Final   Creatinine, Urine  Date Value Ref Range Status  11/03/2023 40 20 - 275 mg/dL Final         Passed - Completed PHQ-2 or PHQ-9 in the last 360 days      Passed - Valid encounter within last 12 months    Recent Outpatient Visits           2 months ago Controlled type 2 diabetes with neuropathy [facility])   [place] [facility] Glenard Mire, MD   6 months ago Type 2 diabetes mellitus without complication, without long-term current use of insulin  [facility])   [place] [facility] [place], Mire, MD   8 months ago Controlled type 2 diabetes with neuropathy [facility])    [facility] Glenard Mire, MD       Future Appointments             In 3 months Sowles,  Krichna, MD [facility], [place]   In 9 months Claudene Lehmann, MD [facility]

## 2024-01-20 ENCOUNTER — Telehealth: Payer: Self-pay

## 2024-01-20 ENCOUNTER — Other Ambulatory Visit: Payer: Self-pay | Admitting: Family Medicine

## 2024-01-20 DIAGNOSIS — E1140 Type 2 diabetes mellitus with diabetic neuropathy, unspecified: Secondary | ICD-10-CM

## 2024-01-20 NOTE — Telephone Encounter (Signed)
 Copied from CRM #[id number]. Topic: Clinical - Medication Question >> Jan 20, 2024 11:47 AM Deaijah H wrote: Reason for CRM: Patient called in to follow up on medication refill due to requesting twice and pharmacy not receiving. Advised denied due to requesting too soon, she stated she has been out for at least 2 wks & her last fill was 10/10/2023. Please call [phone].

## 2024-01-20 NOTE — Telephone Encounter (Signed)
 Called pharmacy and they did have the rx from 12/23/23 they will be filling it. I called pt no answer but left detailed vm issue is resolved they do have script.

## 2024-01-20 NOTE — Telephone Encounter (Signed)
 Requested Prescriptions  Pending Prescriptions Disp Refills   levocetirizine (XYZAL ) 5 MG tablet [Pharmacy Med Name: Levocetirizine Dihydrochloride  5 MG Oral Tablet] 90 tablet 2    Sig: Take 1 tablet by mouth once daily     Ear, Nose, and Throat:  Antihistamines - levocetirizine dihydrochloride  Passed - 01/20/2024 10:26 AM      Passed - Cr in normal range and within 360 days    Creat  Date Value Ref Range Status  11/03/2023 0.87 0.50 - 1.05 mg/dL Final   Creatinine, Urine  Date Value Ref Range Status  11/03/2023 40 20 - 275 mg/dL Final         Passed - eGFR is 10 or above and within 360 days    GFR, Est African American  Date Value Ref Range Status  11/28/2019 106 > OR = 60 mL/min/1.44m2 Final   GFR, Est Non African American  Date Value Ref Range Status  11/28/2019 91 > OR = 60 mL/min/1.34m2 Final   GFR, Estimated  Date Value Ref Range Status  05/06/2020 >60 >60 mL/min Final    Comment:    (NOTE) Calculated using the CKD-EPI Creatinine Equation (2021)    eGFR  Date Value Ref Range Status  11/03/2023 72 > OR = 60 mL/min/1.80m2 Final         Passed - Valid encounter within last 12 months    Recent Outpatient Visits           2 months ago Controlled type 2 diabetes with neuropathy [facility])   [place] [facility] Glenard Mire, MD   6 months ago Type 2 diabetes mellitus without complication, without long-term current use of insulin  [facility])   [place] [facility] [place], Mire, MD   8 months ago Controlled type 2 diabetes with neuropathy [facility])   [facility] Glenard Mire, MD       Future Appointments             In 3 months Glenard, Krichna, MD [facility], [place]   In 9 months Claudene Lehmann, MD [facility] [place] [facility]

## 2024-01-22 NOTE — Telephone Encounter (Signed)
 Duplicate request,refilled 12/23/23.  Requested Prescriptions  Pending Prescriptions Disp Refills   pregabalin  (LYRICA ) 100 MG capsule [Pharmacy Med Name: Pregabalin  100 MG Oral Capsule] 270 capsule 0    Sig: TAKE 1 CAPSULE BY MOUTH THREE TIMES DAILY     Not Delegated - Neurology:  Anticonvulsants - Controlled - pregabalin  Failed - 01/22/2024  8:54 AM      Failed - This refill cannot be delegated      Passed - Cr in normal range and within 360 days    Creat  Date Value Ref Range Status  11/03/2023 0.87 0.50 - 1.05 mg/dL Final   Creatinine, Urine  Date Value Ref Range Status  11/03/2023 40 20 - 275 mg/dL Final         Passed - Completed PHQ-2 or PHQ-9 in the last 360 days      Passed - Valid encounter within last 12 months    Recent Outpatient Visits           2 months ago Controlled type 2 diabetes with neuropathy [facility])   [place] [facility] Glenard Mire, MD   6 months ago Type 2 diabetes mellitus without complication, without long-term current use of insulin  [facility])   [place] [facility] Glenard Mire, MD   9 months ago Controlled type 2 diabetes with neuropathy [facility])   [facility] Sowles, Krichna, MD       Future Appointments             In 3 months Glenard, Krichna, MD [facility], [place]   In 9 months Claudene Lehmann, MD [facility]

## 2024-02-25 ENCOUNTER — Encounter: Payer: Self-pay | Admitting: Family Medicine

## 2024-03-14 ENCOUNTER — Other Ambulatory Visit: Payer: Self-pay | Admitting: Family Medicine

## 2024-03-14 DIAGNOSIS — Z1231 Encounter for screening mammogram for malignant neoplasm of breast: Secondary | ICD-10-CM

## 2024-03-31 ENCOUNTER — Ambulatory Visit
Admission: RE | Admit: 2024-03-31 | Discharge: 2024-03-31 | Disposition: A | Source: Ambulatory Visit | Attending: Family Medicine | Admitting: Family Medicine

## 2024-03-31 ENCOUNTER — Encounter: Payer: Self-pay | Admitting: Family Medicine

## 2024-03-31 ENCOUNTER — Other Ambulatory Visit: Payer: Self-pay | Admitting: Family Medicine

## 2024-03-31 DIAGNOSIS — J302 Other seasonal allergic rhinitis: Secondary | ICD-10-CM

## 2024-03-31 DIAGNOSIS — E785 Hyperlipidemia, unspecified: Secondary | ICD-10-CM

## 2024-03-31 DIAGNOSIS — Z1231 Encounter for screening mammogram for malignant neoplasm of breast: Secondary | ICD-10-CM | POA: Diagnosis present

## 2024-03-31 NOTE — Telephone Encounter (Signed)
 Requested Prescriptions  Pending Prescriptions Disp Refills   atorvastatin  (LIPITOR) 40 MG tablet [Pharmacy Med Name: Atorvastatin  Calcium  40 MG Oral Tablet] 90 tablet 0    Sig: Take 1 tablet by mouth once daily     Cardiovascular:  Antilipid - Statins Failed - 03/31/2024 11:57 AM      Failed - Lipid Panel in normal range within the last 12 months    Cholesterol, Total  Date Value Ref Range Status  05/02/2015 124 100 - 199 mg/dL Final   Cholesterol  Date Value Ref Range Status  11/03/2023 130 <200 mg/dL Final   LDL Cholesterol (Calc)  Date Value Ref Range Status  11/03/2023 55 mg/dL (calc) Final    Comment:    Reference range: <100 . Desirable range <100 mg/dL for primary prevention;   <70 mg/dL for patients with CHD or diabetic patients  with > or = 2 CHD risk factors. SABRA LDL-C is now calculated using the Martin-Hopkins  calculation, which is a validated novel method providing  better accuracy than the Friedewald equation in the  estimation of LDL-C.  Gladis APPLETHWAITE et al. SANDREA. 7986;689(80): 2061-2068  (http://education.QuestDiagnostics.com/faq/FAQ164)    HDL  Date Value Ref Range Status  11/03/2023 48 (L) > OR = 50 mg/dL Final  97/76/7982 68 >60 mg/dL Final   Triglycerides  Date Value Ref Range Status  11/03/2023 208 (H) <150 mg/dL Final    Comment:    . If a non-fasting specimen was collected, consider repeat triglyceride testing on a fasting specimen if clinically indicated.  Veatrice et al. J. of Clin. Lipidol. 2015;9:129-169. SABRA          Passed - Patient is not pregnant      Passed - Valid encounter within last 12 months    Recent Outpatient Visits           4 months ago Controlled type 2 diabetes with neuropathy [facility])   [place] [facility] [place], Dorette, MD   9 months ago Type 2 diabetes mellitus without complication, without long-term current use of insulin  [facility])   [place] [facility] [place], Dorette, MD   11  months ago Controlled type 2 diabetes with neuropathy [facility])   [place] [facility] Sowles, Krichna, MD       Future Appointments             In 1 month Sowles, Krichna, MD [facility], [place]   In 7 months Claudene Lehmann, MD [place] [facility]             montelukast  (SINGULAIR ) 10 MG tablet [Pharmacy Med Name: Montelukast  Sodium 10 MG Oral Tablet] 90 tablet 0    Sig: TAKE 1 TABLET BY MOUTH AT BEDTIME     Pulmonology:  Leukotriene Inhibitors Passed - 03/31/2024 11:57 AM      Passed - Valid encounter within last 12 months    Recent Outpatient Visits           4 months ago Controlled type 2 diabetes with neuropathy [facility])   [place] [facility] Glenard Dorette, MD   9 months ago Type 2 diabetes mellitus without complication, without long-term current use of insulin  [facility])    [facility] Glenard Doret[facility], MD   11 months ago Controlled type 2 diabetes with neuropathy [facility])   [facility] Glenard Dorette, MD       Future Appointments  In 1 month Sowles, Krichna, MD [facility], [place]   In 7 months Claudene Lehmann, MD [facility]

## 2024-05-04 ENCOUNTER — Ambulatory Visit: Payer: Medicare PPO

## 2024-05-05 ENCOUNTER — Ambulatory Visit: Admitting: Family Medicine

## 2024-05-11 ENCOUNTER — Encounter

## 2024-11-09 ENCOUNTER — Ambulatory Visit: Admitting: Dermatology
